# Patient Record
Sex: Male | Born: 1960
Health system: Southern US, Community
[De-identification: ages and names within clinical notes are randomized; demographics above are authoritative.]

## PROBLEM LIST (undated history)

## (undated) DIAGNOSIS — Z87442 Personal history of urinary calculi: Secondary | ICD-10-CM

## (undated) DIAGNOSIS — I2510 Atherosclerotic heart disease of native coronary artery without angina pectoris: Secondary | ICD-10-CM

## (undated) DIAGNOSIS — K219 Gastro-esophageal reflux disease without esophagitis: Secondary | ICD-10-CM

## (undated) DIAGNOSIS — E7800 Pure hypercholesterolemia, unspecified: Secondary | ICD-10-CM

## (undated) DIAGNOSIS — E559 Vitamin D deficiency, unspecified: Secondary | ICD-10-CM

## (undated) DIAGNOSIS — Z8781 Personal history of (healed) traumatic fracture: Secondary | ICD-10-CM

## (undated) DIAGNOSIS — I10 Essential (primary) hypertension: Secondary | ICD-10-CM

## (undated) DIAGNOSIS — N200 Calculus of kidney: Secondary | ICD-10-CM

## (undated) DIAGNOSIS — I209 Angina pectoris, unspecified: Secondary | ICD-10-CM

## (undated) DIAGNOSIS — G8929 Other chronic pain: Secondary | ICD-10-CM

## (undated) DIAGNOSIS — M79606 Pain in leg, unspecified: Secondary | ICD-10-CM

## (undated) HISTORY — PX: COLONOSCOPY: SHX174

## (undated) HISTORY — DX: Pure hypercholesterolemia, unspecified: E78.00

## (undated) HISTORY — DX: Calculus of kidney: N20.0

## (undated) HISTORY — DX: Pain in leg, unspecified: M79.606

## (undated) HISTORY — DX: Gastro-esophageal reflux disease without esophagitis: K21.9

## (undated) HISTORY — DX: Other chronic pain: G89.29

## (undated) HISTORY — DX: Vitamin D deficiency, unspecified: E55.9

## (undated) HISTORY — DX: Personal history of (healed) traumatic fracture: Z87.81

---
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
## 1980-12-27 HISTORY — PX: LEG SURGERY: SHX1003

## 1981-12-27 HISTORY — PX: ANKLE FRACTURE SURGERY: SHX122

## 2011-04-11 ENCOUNTER — Emergency Department: Payer: Self-pay | Admitting: Emergency Medicine

## 2011-04-14 ENCOUNTER — Ambulatory Visit: Payer: Self-pay | Admitting: Urology

## 2011-04-15 ENCOUNTER — Ambulatory Visit: Payer: Self-pay | Admitting: Urology

## 2011-04-29 ENCOUNTER — Ambulatory Visit: Payer: Self-pay | Admitting: Urology

## 2011-06-21 ENCOUNTER — Ambulatory Visit: Payer: Self-pay | Admitting: General Surgery

## 2011-06-22 LAB — PATHOLOGY REPORT

## 2012-01-24 ENCOUNTER — Ambulatory Visit: Payer: Self-pay | Admitting: Urology

## 2012-02-29 IMAGING — CR DG ABDOMEN 1V
1 series · 2 of 2 positions shown · non-contrast
Comparison: none

REASON FOR EXAM: Calculus
COMMENTS:

[Series 1: view not recorded · 0.17mm/px · 2 of 2 slices shown]
[im 1/2]
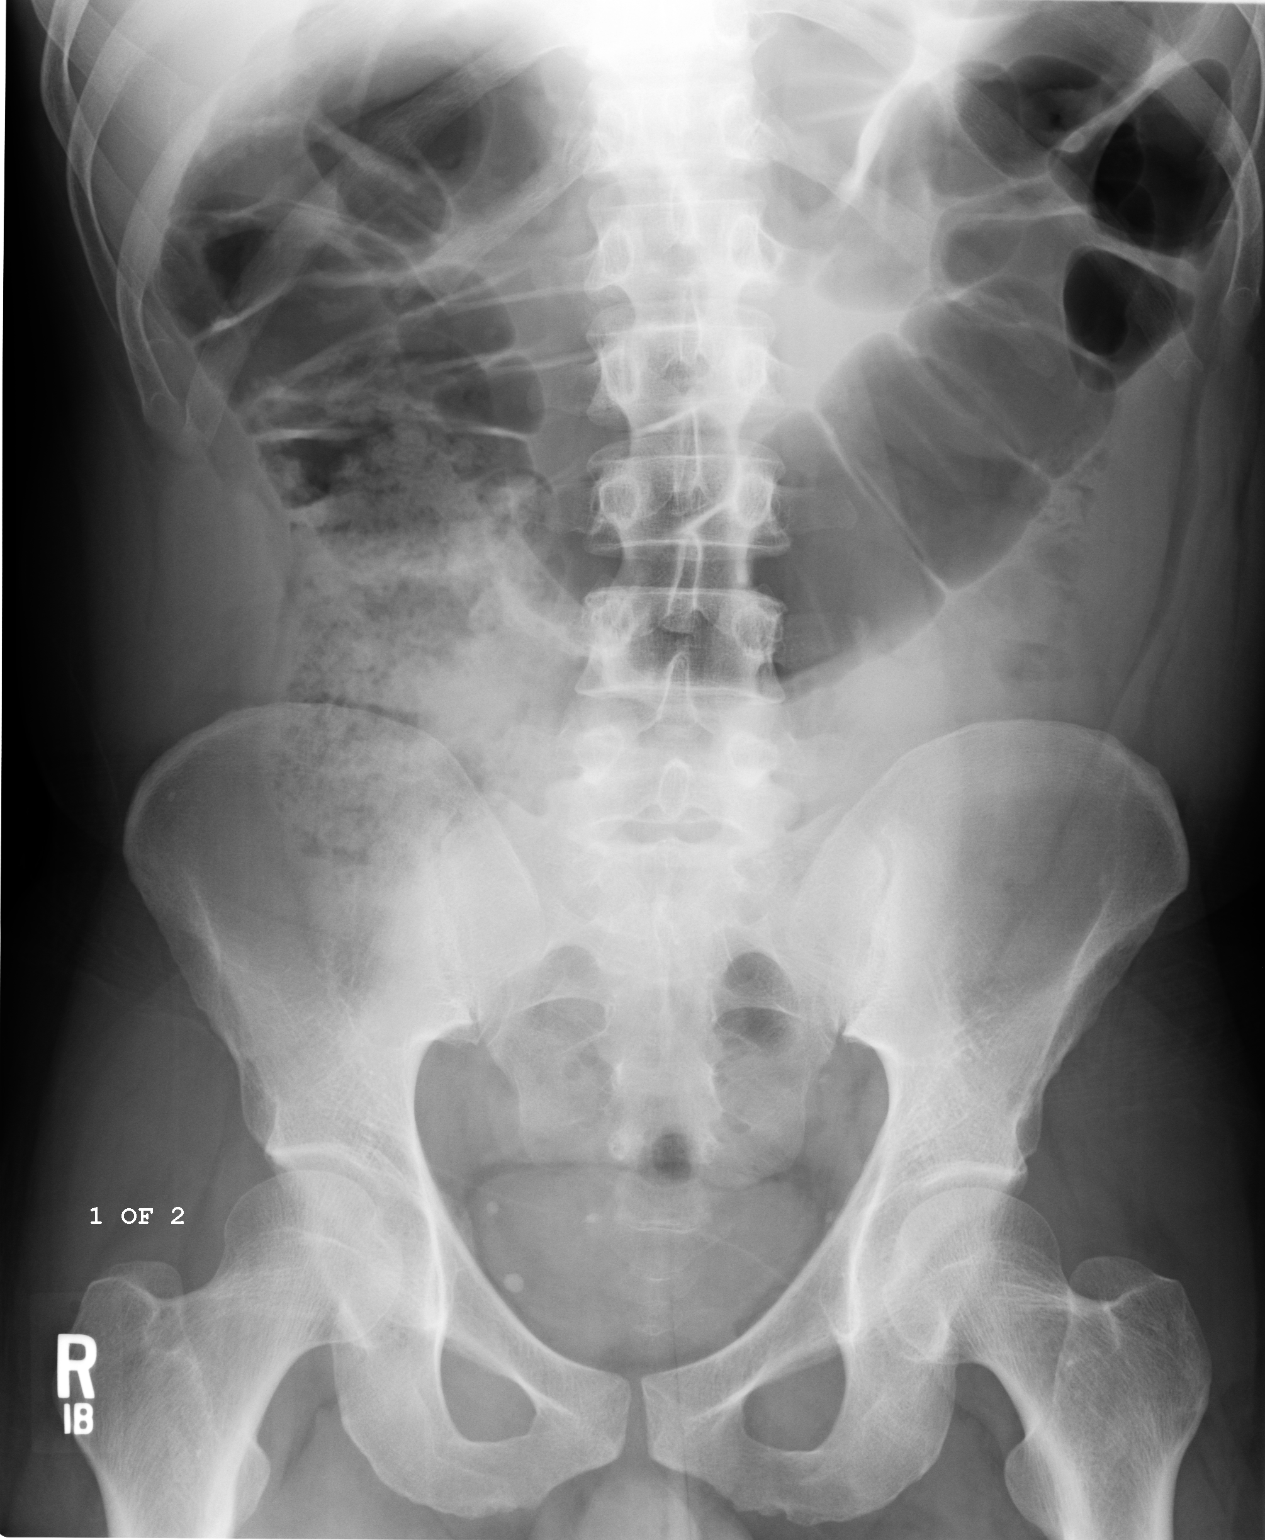
[im 2/2]
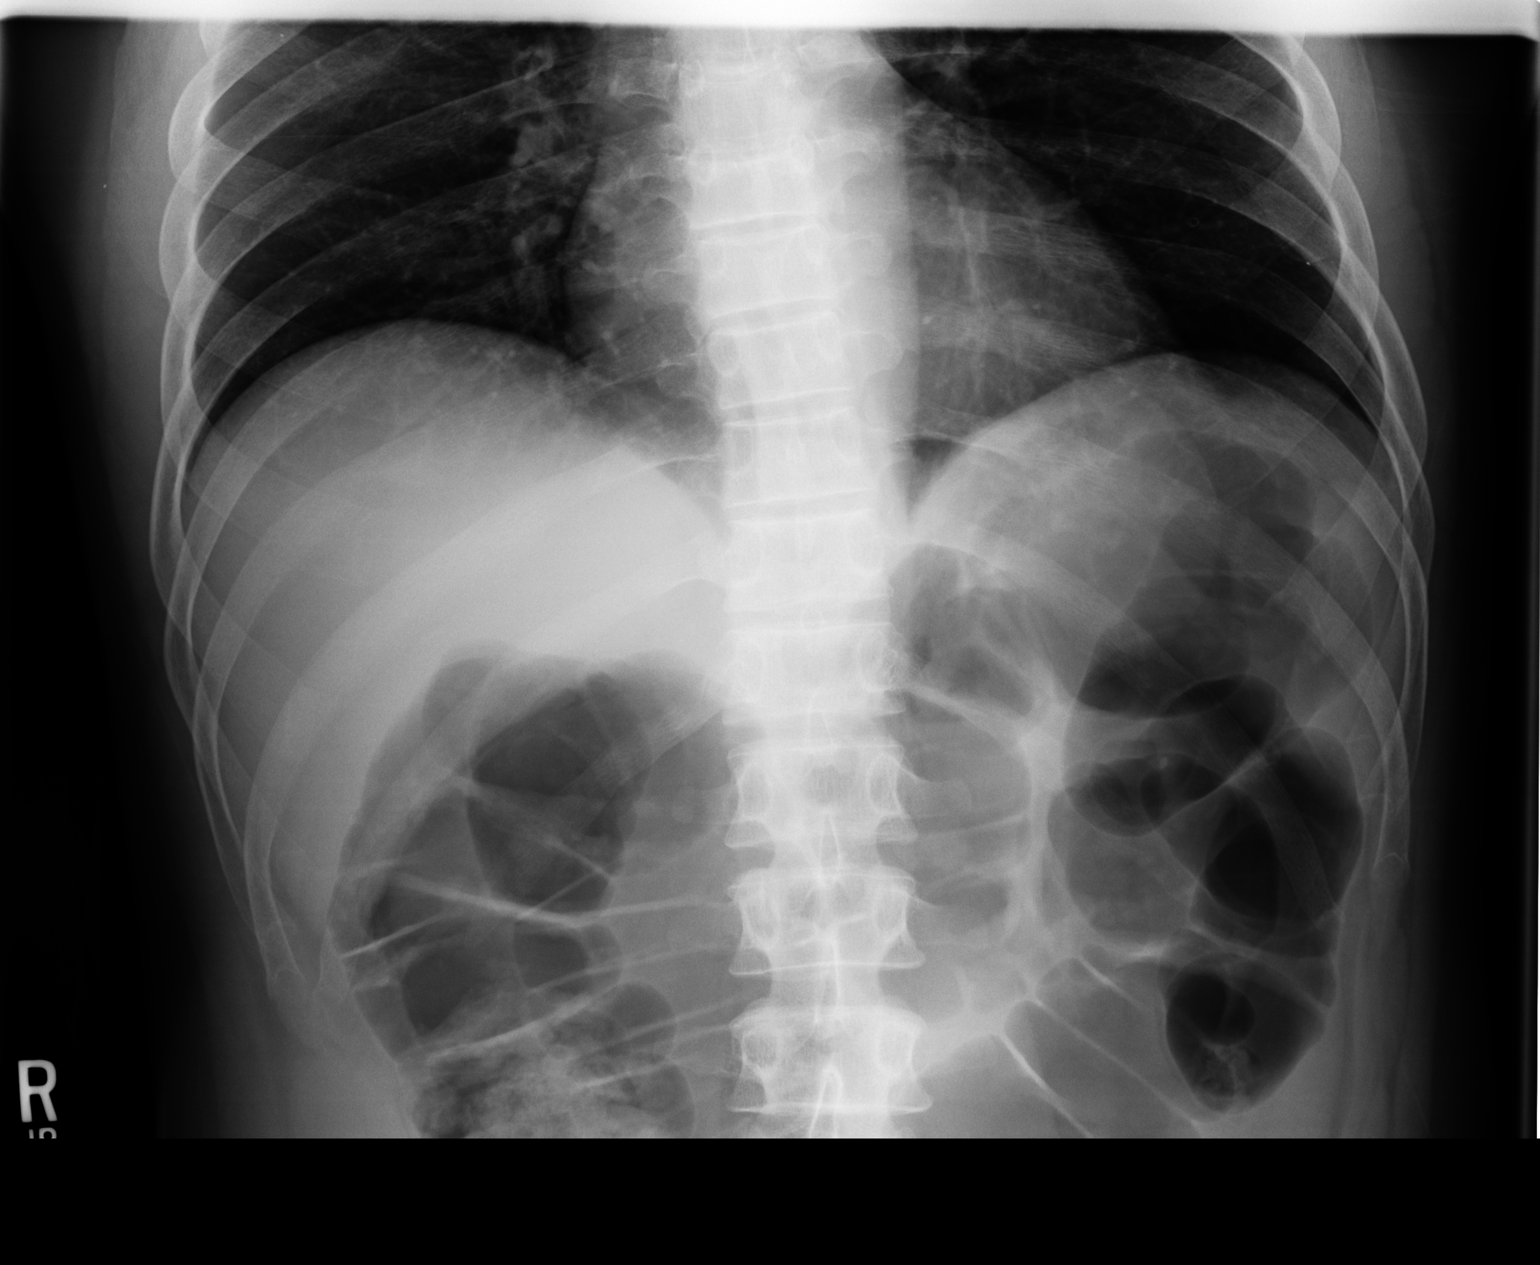

[2 of 2 positions shown; findings below may reference images not displayed]

PROCEDURE:     DXR - DXR KIDNEY URETER BLADDER  - April 15, 2011 [DATE]

RESULT:     There is a tiny calcification projected over the urinary bladder
on the right and is likely within the bladder or at the right ureterovesical
junction. This apparently represents the calcification previously present at
the level of the L4 lumbar transverse process on the right. The kidneys are
in great part obscured by overlying bowel and bowel gas. No definite
intrarenal calcifications are seen but visualization of the kidneys is
limited.
IMPRESSION: 1.  Distal right ureterolithiasis versus a tiny calcification in the urinary
bladder.
2.  The previously observed calcification at the level of the L4 lumbar
spine transverse process on the right is no longer seen and apparently has
moved inferiorly.
3.  No additional renal or ureteral calcifications are identified.

## 2013-03-23 ENCOUNTER — Ambulatory Visit (INDEPENDENT_AMBULATORY_CARE_PROVIDER_SITE_OTHER): Payer: Self-pay | Admitting: Internal Medicine

## 2013-03-23 ENCOUNTER — Encounter: Payer: Self-pay | Admitting: Internal Medicine

## 2013-03-23 VITALS — BP 120/60 | HR 70 | Temp 97.7°F | Ht 66.0 in | Wt 152.2 lb

## 2013-03-23 DIAGNOSIS — Z800 Family history of malignant neoplasm of digestive organs: Secondary | ICD-10-CM

## 2013-03-23 DIAGNOSIS — G8929 Other chronic pain: Secondary | ICD-10-CM

## 2013-03-23 DIAGNOSIS — E7800 Pure hypercholesterolemia, unspecified: Secondary | ICD-10-CM

## 2013-03-23 DIAGNOSIS — M79609 Pain in unspecified limb: Secondary | ICD-10-CM

## 2013-03-23 DIAGNOSIS — E559 Vitamin D deficiency, unspecified: Secondary | ICD-10-CM

## 2013-03-23 DIAGNOSIS — N200 Calculus of kidney: Secondary | ICD-10-CM

## 2013-03-23 DIAGNOSIS — K219 Gastro-esophageal reflux disease without esophagitis: Secondary | ICD-10-CM

## 2013-03-23 MED ORDER — PANTOPRAZOLE SODIUM 40 MG PO TBEC: 40.0000 mg | DELAYED_RELEASE_TABLET | Freq: Every day | ORAL | Status: DC

## 2013-03-23 MED ORDER — TRAMADOL HCL 50 MG PO TABS: 50.0000 mg | ORAL_TABLET | Freq: Two times a day (BID) | ORAL | Status: DC | PRN

## 2013-03-25 ENCOUNTER — Encounter: Payer: Self-pay | Admitting: Internal Medicine

## 2013-03-25 DIAGNOSIS — Z800 Family history of malignant neoplasm of digestive organs: Secondary | ICD-10-CM | POA: Insufficient documentation

## 2013-03-25 DIAGNOSIS — K219 Gastro-esophageal reflux disease without esophagitis: Secondary | ICD-10-CM | POA: Insufficient documentation

## 2013-03-25 DIAGNOSIS — E7800 Pure hypercholesterolemia, unspecified: Secondary | ICD-10-CM | POA: Insufficient documentation

## 2013-03-25 DIAGNOSIS — M79609 Pain in unspecified limb: Secondary | ICD-10-CM | POA: Insufficient documentation

## 2013-03-25 DIAGNOSIS — N200 Calculus of kidney: Secondary | ICD-10-CM | POA: Insufficient documentation

## 2013-03-25 DIAGNOSIS — E559 Vitamin D deficiency, unspecified: Secondary | ICD-10-CM | POA: Insufficient documentation

## 2013-03-25 NOTE — Assessment & Plan Note (Signed)
Followed by Dr Cope.  Currently asymptomatic.  Follow.   

## 2013-03-25 NOTE — Assessment & Plan Note (Signed)
Discussed with him my desire to avoid narcotic pain med for long term pain control.  Unable to take antiinflammatories.  Ultram 50mg  bid prn pain.  Follow.

## 2013-03-25 NOTE — Assessment & Plan Note (Signed)
Father had colon cancer.  Discussed with him the need for colonoscopy.  Apparently has tried previously with problems.  Want to refer back.  He declines at this time.  Follow.   

## 2013-03-25 NOTE — Progress Notes (Signed)
Subjective:    [name], male    DOB: 27-Apr-1961, 52 y.o.   MRN: [id number]  HPI 52 year old male with past history of GERD, kidney stones, vitamin D deficiency, hypercholesterolemia and chronic ankle and leg pain.  He comes in today accompanied by his wife.  History obtained from both of them.  States he was previously seeing Dr Bethena Midget and Dr Kathrene Alu.  Transferring his care here.  Regarding his cholesterol, he states that he developed a rash with simvastatin.  Did not tolerate Lipitor.  Off all cholesterol medication now.  Taking fish oil.  He has a history of GERD.  Takes Nexium prn.  He also has a history of kidney stones.  Followed by Dr Achilles Dunk.  On magnesium for this.  Has follow up with him in June.  He suffered a leg injury years ago.  Had surgery.  Now with chronic pain.  Works as a Nutritional therapist.  Also plays golf.  Hurts more in the evening if he has been on it a lot.  Takes hydrocodone.  Unable to take antiinflammatories.  Burns his stomach.  He does report he had a tick bite one year ago - on his back.  Still has issues with it itching.  No redness.  No headache or fever.     Past Medical History  Diagnosis Date  . Vitamin D deficiency   . Chronic leg pain     s/p injury right leg (age 20)  . History of ankle fracture     persistent pain  . Nephrolithiasis     followed by Assunta Gambles  . Hypercholesterolemia   . GERD (gastroesophageal reflux disease)     Outpatient Encounter Prescriptions as of 03/23/2013  Medication Sig Dispense Refill  . magnesium hydroxide (MILK OF MAGNESIA) 400 MG/5ML suspension Take by mouth daily.      . Vitamin D, Ergocalciferol, (DRISDOL) 50000 UNITS CAPS Take 50,000 Units by mouth.      . [DISCONTINUED] atorvastatin (LIPITOR) 20 MG tablet Take 20 mg by mouth daily.      . [DISCONTINUED] esomeprazole (NEXIUM) 40 MG packet Take 40 mg by mouth daily.      . [DISCONTINUED] HYDROcodone-acetaminophen (LORTAB) 10-500 MG per tablet Take 1 tablet by mouth  every 6 (six) hours as needed for pain.      . pantoprazole (PROTONIX) 40 MG tablet Take 1 tablet (40 mg total) by mouth daily.  30 tablet  3  . traMADol (ULTRAM) 50 MG tablet Take 1 tablet (50 mg total) by mouth 2 (two) times daily as needed for pain.  30 tablet  0   No facility-administered encounter medications on file as of 03/23/2013.    Review of Systems Patient denies any significant headache, lightheadedness or dizziness.  Does report some intermittent sinus headaches.  Some allergy symptoms with sneezing and drainage.   No chest pain, tightness or palpitations.  No increased shortness of breath, cough or congestion.  No nausea or vomiting.  Acid reflux as outlined.  No abdominal pain or cramping.  No bowel change, such as diarrhea, constipation, BRBPR or melana.  No urine change.   Chronic leg pain as outlined.  No rash.     Objective:   Physical Exam Filed Vitals:   03/23/13 1115  BP: 120/60  Pulse: 70  Temp: 97.7 F (16.71 C)   52 year old male in no acute distress.  HEENT:  Nares - clear.  Oropharynx - without lesions.  No tenderness to  palpation over the sinuses.  TMs without erythema.  NECK:  Supple.  Nontender.  No audible carotid bruit.  HEART:  Appears to be regular.   LUNGS:  No crackles or wheezing audible.  Respirations even and unlabored.   RADIAL PULSE:  Equal bilaterally.  ABDOMEN:  Soft.  Nontender.  Bowel sounds present and normal.  No audible abdominal bruit.   EXTREMITIES:  No increased edema present.  DP pulses palpable and equal bilaterally.     BACK:  Non tender.  SKIN:  Small raised lesion on mid upper back.  No surrounding erythema or warmth.  Non tender.  Well healed lesion/scarring - right lower extremity.  MSK:  No pain to palpation over the ankle.  No increased pain with increased rom.      Assessment & Plan:  S/P TICK BITE.  Occurred one year ago.  Can try antihistamine to help with itching.  Can also try 1% hydrocortisone cream for one week.   Follow.   ALLERGIES.  Can try otc antihistamine (zyrtec, claritin, etc).  Notify me if persistent problems.   HEALTH MAINTENANCE.  Schedule him for a physical.  Obtain records to review.  Check routine labs.  Followed by Dr Achilles Dunk.  Thinks he checks psa.  Obtain records.  Discussed need for colonoscopy.  He declines.

## 2013-03-25 NOTE — Assessment & Plan Note (Signed)
Will start protonix 40mg  q day.  Follow.  Discussed the need for EGD.  He has return of symptoms if he is off the medication 2-3 days.  Follow.  He desires no referral at this time.  Follow.

## 2013-03-25 NOTE — Assessment & Plan Note (Signed)
Low cholesterol diet and exercise.  Has had intolerance to some statins.  Check lipid panel.

## 2013-03-25 NOTE — Assessment & Plan Note (Signed)
Recheck vitamin D level 

## 2013-03-31 ENCOUNTER — Encounter: Payer: Self-pay | Admitting: Internal Medicine

## 2013-04-06 ENCOUNTER — Other Ambulatory Visit (INDEPENDENT_AMBULATORY_CARE_PROVIDER_SITE_OTHER): Payer: BC Managed Care – PPO

## 2013-04-06 DIAGNOSIS — E7800 Pure hypercholesterolemia, unspecified: Secondary | ICD-10-CM

## 2013-04-06 LAB — CBC WITH DIFFERENTIAL/PLATELET
Basophils Relative: 0.4 % (ref 0.0–3.0)
Eosinophils Absolute: 0.1 10*3/uL (ref 0.0–0.7)
Hemoglobin: 15.4 g/dL (ref 13.0–17.0)
Lymphs Abs: 1.9 10*3/uL (ref 0.7–4.0)
MCHC: 34.1 g/dL (ref 30.0–36.0)
MCV: 88.4 fl (ref 78.0–100.0)
Monocytes Absolute: 0.6 10*3/uL (ref 0.1–1.0)
Neutro Abs: 5.4 10*3/uL (ref 1.4–7.7)
RBC: 5.09 Mil/uL (ref 4.22–5.81)

## 2013-04-06 LAB — COMPREHENSIVE METABOLIC PANEL
ALT: 20 U/L (ref 0–53)
CO2: 26 mEq/L (ref 19–32)
Chloride: 101 mEq/L (ref 96–112)
GFR: 102.04 mL/min (ref >60.00)
Sodium: 134 mEq/L — ABNORMAL LOW (ref 135–145)
Total Bilirubin: 0.4 mg/dL (ref 0.3–1.2)
Total Protein: 7.3 g/dL (ref 6.0–8.3)

## 2013-04-06 LAB — TSH: TSH: 0.87 u[IU]/mL (ref 0.35–5.50)

## 2013-04-07 ENCOUNTER — Other Ambulatory Visit: Payer: Self-pay | Admitting: Internal Medicine

## 2013-04-07 DIAGNOSIS — E871 Hypo-osmolality and hyponatremia: Secondary | ICD-10-CM

## 2013-04-07 NOTE — Progress Notes (Signed)
Order placed for follow up sodium.  

## 2013-04-20 ENCOUNTER — Encounter: Payer: Self-pay | Admitting: Internal Medicine

## 2013-04-20 ENCOUNTER — Encounter: Payer: Self-pay | Admitting: *Deleted

## 2013-04-20 ENCOUNTER — Ambulatory Visit (INDEPENDENT_AMBULATORY_CARE_PROVIDER_SITE_OTHER): Payer: BC Managed Care – PPO | Admitting: Internal Medicine

## 2013-04-20 VITALS — BP 122/90 | HR 66 | Temp 98.0°F | Ht 66.0 in | Wt 149.8 lb

## 2013-04-20 DIAGNOSIS — Z800 Family history of malignant neoplasm of digestive organs: Secondary | ICD-10-CM

## 2013-04-20 DIAGNOSIS — E871 Hypo-osmolality and hyponatremia: Secondary | ICD-10-CM

## 2013-04-20 DIAGNOSIS — E7800 Pure hypercholesterolemia, unspecified: Secondary | ICD-10-CM

## 2013-04-20 MED ORDER — PRAVASTATIN SODIUM 10 MG PO TABS: 10.0000 mg | ORAL_TABLET | Freq: Every day | ORAL | Status: DC

## 2013-04-22 ENCOUNTER — Encounter: Payer: Self-pay | Admitting: Internal Medicine

## 2013-04-22 NOTE — Assessment & Plan Note (Signed)
Father had colon cancer.  Discussed with him the need for colonoscopy.  Apparently has tried previously with problems.  Want to refer back.  He declines at this time.  Follow.

## 2013-04-22 NOTE — Assessment & Plan Note (Signed)
Lipid profile 4/14 /14 revealed total cholesterol 268, triglycerides 111, HDL 41 and LDL 200.  Will try another statin.  Will start pravastatin 10mg  q Monday, Wednesday and Friday.  Will increase as tolerated.  Check liver panel in 6 weeks.

## 2013-04-22 NOTE — Assessment & Plan Note (Signed)
Stable

## 2013-04-22 NOTE — Progress Notes (Signed)
  Subjective:    [name], male    DOB: 10-30-1961, 52 y.o.   MRN: [id number]  HPI 52 year old male with past history of GERD, kidney stones, vitamin D deficiency, hypercholesterolemia and chronic ankle and leg pain.  He comes in today for a scheduled follow up.  States he is doing relatively well.  Stays active and reports no chest pain with increased activity or exertion.  Recent labs revealed elevated cholesterol.  Has previously taken lipitor - caused fatigue.  I had previously noted a rash with simvastatin, but he denies this.  States he has never had a rash with cholesterol medication - just intolerance.  Overall he feels things are stable.     Past Medical History  Diagnosis Date  . Vitamin D deficiency   . Chronic leg pain     s/p injury right leg (age 52)  . History of ankle fracture     persistent pain  . Nephrolithiasis     followed by Assunta Gambles  . Hypercholesterolemia   . GERD (gastroesophageal reflux disease)     Outpatient Encounter Prescriptions as of 04/20/2013  Medication Sig Dispense Refill  . pantoprazole (PROTONIX) 40 MG tablet Take 1 tablet (40 mg total) by mouth daily.  30 tablet  3  . traMADol (ULTRAM) 50 MG tablet Take 1 tablet (50 mg total) by mouth 2 (two) times daily as needed for pain.  30 tablet  0  . magnesium hydroxide (MILK OF MAGNESIA) 400 MG/5ML suspension Take by mouth daily.      . pravastatin (PRAVACHOL) 10 MG tablet Take 1 tablet (10 mg total) by mouth daily.  30 tablet  1  . Vitamin D, Ergocalciferol, (DRISDOL) 50000 UNITS CAPS Take 50,000 Units by mouth.       No facility-administered encounter medications on file as of 04/20/2013.    Review of Systems Patient denies any headache, lightheadedness or dizziness.   No chest pain, tightness or palpitations. No increased shortness of breath, cough or congestion.  No nausea or vomiting.  No acid reflux reported.  No abdominal pain or cramping.  No bowel change, such as diarrhea,  constipation, BRBPR or melana.  No urine change.   Chronic leg pain as outlined.  No rash.       Objective:   Physical Exam  Filed Vitals:   04/20/13 0858  BP: 122/90  Pulse: 66  Temp: 98 F (67.50 C)   52 year old male in no acute distress.  HEENT:  Nares - clear.  Oropharynx - without lesions.  No tenderness to palpation over the sinuses.  NECK:  Supple.  Nontender.  No audible carotid bruit.  HEART:  Appears to be regular.   LUNGS:  No crackles or wheezing audible.  Respirations even and unlabored.   RADIAL PULSE:  Equal bilaterally.  ABDOMEN:  Soft.  Nontender.  Bowel sounds present and normal.  No audible abdominal bruit.   EXTREMITIES:  No increased edema present.  DP pulses palpable and equal bilaterally.     BACK:  No rash.      Assessment & Plan:  S/P TICK BITE.  No significant lesion.  Follow.   HEALTH MAINTENANCE.  Scheduled for a physical.  Still need records to review.   Followed by Dr Achilles Dunk.  Thinks he checks psa.  Obtain records.  Again discussed need for colonoscopy.  Will notify me when agreeable.

## 2013-05-21 ENCOUNTER — Emergency Department: Payer: Self-pay | Admitting: Internal Medicine

## 2013-07-10 ENCOUNTER — Encounter: Payer: Self-pay | Admitting: Internal Medicine

## 2013-07-20 ENCOUNTER — Encounter: Payer: Self-pay | Admitting: Internal Medicine

## 2013-08-23 ENCOUNTER — Ambulatory Visit: Payer: BC Managed Care – PPO | Admitting: Adult Health

## 2013-09-17 ENCOUNTER — Encounter: Payer: Self-pay | Admitting: Internal Medicine

## 2013-09-21 ENCOUNTER — Encounter: Payer: Self-pay | Admitting: Internal Medicine

## 2013-11-16 ENCOUNTER — Encounter: Payer: Self-pay | Admitting: Internal Medicine

## 2013-11-16 ENCOUNTER — Ambulatory Visit (INDEPENDENT_AMBULATORY_CARE_PROVIDER_SITE_OTHER): Payer: BC Managed Care – PPO | Admitting: Internal Medicine

## 2013-11-16 VITALS — BP 118/70 | HR 82 | Temp 97.9°F | Ht 65.0 in | Wt 147.5 lb

## 2013-11-16 DIAGNOSIS — K219 Gastro-esophageal reflux disease without esophagitis: Secondary | ICD-10-CM

## 2013-11-16 DIAGNOSIS — Z125 Encounter for screening for malignant neoplasm of prostate: Secondary | ICD-10-CM

## 2013-11-16 DIAGNOSIS — G8929 Other chronic pain: Secondary | ICD-10-CM

## 2013-11-16 DIAGNOSIS — E559 Vitamin D deficiency, unspecified: Secondary | ICD-10-CM

## 2013-11-16 DIAGNOSIS — N200 Calculus of kidney: Secondary | ICD-10-CM

## 2013-11-16 DIAGNOSIS — M79609 Pain in unspecified limb: Secondary | ICD-10-CM

## 2013-11-16 DIAGNOSIS — E7800 Pure hypercholesterolemia, unspecified: Secondary | ICD-10-CM

## 2013-11-16 DIAGNOSIS — Z800 Family history of malignant neoplasm of digestive organs: Secondary | ICD-10-CM

## 2013-11-16 MED ORDER — ESOMEPRAZOLE MAGNESIUM 40 MG PO CPDR: 40.0000 mg | DELAYED_RELEASE_CAPSULE | Freq: Every day | ORAL | Status: DC

## 2013-11-16 NOTE — Progress Notes (Signed)
Subjective:    [name], male    DOB: 1961/04/21, 52 y.o.   MRN: [id number]  HPI 52 year old male with past history of GERD, kidney stones, vitamin D deficiency, hypercholesterolemia and chronic ankle and leg pain.  He comes in today to follow up on these issues as well as for a complete physical exam.   States he is doing relatively well.  Stays active and reports no chest pain with increased activity or exertion.  Recent labs revealed elevated cholesterol.  Has previously taken lipitor - caused fatigue.  He  had previously reported he noted a rash with simvastatin, but he denies this today.   States he has never had a rash with cholesterol medication - just intolerance.  Overall he feels things are stable.  His main complaint is that of persistent right ankle pain.  He has injured his ankle previously.  Now with persistent intermittent pain. States antiinflammatories do no help.  States tramadol makes him queezy.  We discussed not using narcotic pain meds to control this pain.   Is not taking anything now.  Brace helps some. Discussed wearing an ankle support.  Tylenol as directed.  Discussed referral to ortho for evaluation given persistent pain.       Past Medical History  Diagnosis Date  . Vitamin D deficiency   . Chronic leg pain     s/p injury right leg (age 42)  . History of ankle fracture     persistent pain  . Nephrolithiasis     followed by Assunta Gambles  . Hypercholesterolemia   . GERD (gastroesophageal reflux disease)     Outpatient Encounter Prescriptions as of 11/16/2013  Medication Sig  . magnesium hydroxide (MILK OF MAGNESIA) 400 MG/5ML suspension Take by mouth daily.  . pravastatin (PRAVACHOL) 10 MG tablet Take 1 tablet (10 mg total) by mouth daily.  . traMADol (ULTRAM) 50 MG tablet Take 1 tablet (50 mg total) by mouth 2 (two) times daily as needed for pain.  . Vitamin D, Ergocalciferol, (DRISDOL) 50000 UNITS CAPS Take 50,000 Units by mouth.  .  [DISCONTINUED] pantoprazole (PROTONIX) 40 MG tablet Take 1 tablet (40 mg total) by mouth daily.    Review of Systems Patient denies any headache, lightheadedness or dizziness.   No chest pain, tightness or palpitations. No increased shortness of breath, cough or congestion.  No nausea or vomiting.  No acid reflux reported.  No abdominal pain or cramping.  No bowel change, such as diarrhea, constipation, BRBPR or melana.  No urine change.   Chronic ankle pain as outlined.  He did not tolerate protonix.  nexium works for him.        Objective:   Physical Exam  Filed Vitals:   11/16/13 1538  BP: 118/70  Pulse: 82  Temp: 97.9 F (36.6 C)   Blood pressure recheck:  52/35  52 year old male in no acute distress.  HEENT:  Nares - clear.  Oropharynx - without lesions. NECK:  Supple.  Nontender.  No audible carotid bruit.  HEART:  Appears to be regular.   LUNGS:  No crackles or wheezing audible.  Respirations even and unlabored.   RADIAL PULSE:  Equal bilaterally.  ABDOMEN:  Soft.  Nontender.  Bowel sounds present and normal.  No audible abdominal bruit.  GU:  He declined.    EXTREMITIES:  No increased edema present.  DP pulses palpable and equal bilaterally.         Assessment &  Plan:  HEALTH MAINTENANCE.  Physical today.  He declined GU and prostate check.   Again discussed need for colonoscopy.  Will notify me when agreeable.

## 2013-11-16 NOTE — Progress Notes (Signed)
Pre-visit discussion using our clinic review tool. No additional management support is needed unless otherwise documented below in the visit note.  

## 2013-11-18 ENCOUNTER — Encounter: Payer: Self-pay | Admitting: Internal Medicine

## 2013-11-18 NOTE — Assessment & Plan Note (Signed)
Last lipid profile 4/14 /14 revealed total cholesterol 268, triglycerides 111, HDL 41 and LDL 200.  Will try another statin.  States he has not tolerated cholesterol medication in the past.  Discussed trying crestor.  Will check a more recent lipid profile on him.  Treat as needed.  Follow lipid profile and liver function.  Low cholesterol diet.

## 2013-11-18 NOTE — Assessment & Plan Note (Signed)
States did not tolerate protonix.  Has taken nexium.  This works well for him.  rx for nexium.  Follow.

## 2013-11-18 NOTE — Assessment & Plan Note (Signed)
Recheck vitamin D level 

## 2013-11-18 NOTE — Assessment & Plan Note (Signed)
Father had colon cancer.  Discussed with him the need for colonoscopy.  Apparently has tried previously with problems.  Want to refer back.  He declines at this time.  Follow.

## 2013-11-18 NOTE — Assessment & Plan Note (Signed)
Followed by Dr Cope.  Currently asymptomatic.  Follow.   

## 2013-11-18 NOTE — Assessment & Plan Note (Signed)
Pain mostly localized to his ankle.  Will refer to ortho for evaluation and treatment.  Would like to avoid narcotic medications for long term pain management.

## 2013-11-21 ENCOUNTER — Other Ambulatory Visit: Payer: Self-pay | Admitting: Internal Medicine

## 2013-11-21 NOTE — Telephone Encounter (Signed)
Pt informed me that this made him sick when he took the medication so I do not see the need of refilling.  I explained this to him at his appt.

## 2013-11-21 NOTE — Telephone Encounter (Signed)
Noted  

## 2013-11-23 ENCOUNTER — Other Ambulatory Visit: Payer: BC Managed Care – PPO

## 2013-11-26 ENCOUNTER — Other Ambulatory Visit: Payer: Self-pay | Admitting: *Deleted

## 2013-11-26 ENCOUNTER — Telehealth: Payer: Self-pay | Admitting: Internal Medicine

## 2013-11-26 NOTE — Telephone Encounter (Signed)
States Thomas Harper faxed refill request for tramadol and has not heard back.  Please advise Thomas Harper when this is ready.

## 2013-11-26 NOTE — Telephone Encounter (Signed)
See my last note on for this rx.  Pt informed me when he was here that this medication made him sick.  I do not see refilling a medication if it made him sick.  I discussed this with him when he was here.

## 2013-11-26 NOTE — Telephone Encounter (Signed)
Okay to refill? States Thomas Harper faxed refill request for tramadol and has not heard back. Please advise Thomas Harper when this is ready.

## 2013-11-26 NOTE — Telephone Encounter (Signed)
Sent message & refill request to Dr. Lorin Picket

## 2013-11-28 NOTE — Telephone Encounter (Signed)
I notified Thomas Harper why we would not approve this refill

## 2013-11-30 ENCOUNTER — Other Ambulatory Visit (INDEPENDENT_AMBULATORY_CARE_PROVIDER_SITE_OTHER): Payer: BC Managed Care – PPO

## 2013-11-30 DIAGNOSIS — E559 Vitamin D deficiency, unspecified: Secondary | ICD-10-CM

## 2013-11-30 DIAGNOSIS — E871 Hypo-osmolality and hyponatremia: Secondary | ICD-10-CM

## 2013-11-30 DIAGNOSIS — E7800 Pure hypercholesterolemia, unspecified: Secondary | ICD-10-CM

## 2013-11-30 DIAGNOSIS — Z125 Encounter for screening for malignant neoplasm of prostate: Secondary | ICD-10-CM

## 2013-11-30 LAB — COMPREHENSIVE METABOLIC PANEL
ALT: 22 U/L (ref 0–53)
AST: 21 U/L (ref 0–37)
Alkaline Phosphatase: 60 U/L (ref 39–117)
BUN: 19 mg/dL (ref 6–23)
Chloride: 104 mEq/L (ref 96–112)
Creatinine, Ser: 0.7 mg/dL (ref 0.4–1.5)
Potassium: 4.6 mEq/L (ref 3.5–5.1)
Total Bilirubin: 0.6 mg/dL (ref 0.3–1.2)

## 2013-11-30 LAB — LIPID PANEL
HDL: 50.7 mg/dL (ref >39.00)
Total CHOL/HDL Ratio: 5
Triglycerides: 84 mg/dL (ref 0.0–149.0)
VLDL: 16.8 mg/dL (ref 0.0–40.0)

## 2013-11-30 LAB — LDL CHOLESTEROL, DIRECT: Direct LDL: 210.3 mg/dL

## 2013-12-01 LAB — VITAMIN D 25 HYDROXY (VIT D DEFICIENCY, FRACTURES): Vit D, 25-Hydroxy: 31 ng/mL (ref 30–89)

## 2013-12-03 ENCOUNTER — Other Ambulatory Visit: Payer: Self-pay | Admitting: *Deleted

## 2013-12-03 ENCOUNTER — Other Ambulatory Visit: Payer: Self-pay | Admitting: Internal Medicine

## 2013-12-03 DIAGNOSIS — E7800 Pure hypercholesterolemia, unspecified: Secondary | ICD-10-CM

## 2013-12-03 MED ORDER — ROSUVASTATIN CALCIUM 10 MG PO TABS: 10.0000 mg | ORAL_TABLET | Freq: Every day | ORAL | Status: DC

## 2013-12-03 MED ORDER — TRAMADOL HCL 50 MG PO TABS: 50.0000 mg | ORAL_TABLET | Freq: Two times a day (BID) | ORAL | Status: DC | PRN

## 2013-12-03 NOTE — Progress Notes (Signed)
Orders placed for liver panel check.

## 2013-12-03 NOTE — Telephone Encounter (Signed)
Pt called back after speaking with his wife & states that he gave you the wrong medication name at his last visit when he mentioned that it made him sick. He states that he has no problems with the Tramadol

## 2013-12-03 NOTE — Telephone Encounter (Signed)
Refilled tramadol #30 with no refills.   We will monitor.  He is not to take with alcohol.

## 2013-12-05 NOTE — Telephone Encounter (Signed)
Rx faxed on 12/04/13

## 2013-12-31 ENCOUNTER — Ambulatory Visit (INDEPENDENT_AMBULATORY_CARE_PROVIDER_SITE_OTHER): Payer: BC Managed Care – PPO | Admitting: Adult Health

## 2013-12-31 ENCOUNTER — Encounter (INDEPENDENT_AMBULATORY_CARE_PROVIDER_SITE_OTHER): Payer: Self-pay

## 2013-12-31 ENCOUNTER — Encounter: Payer: Self-pay | Admitting: Adult Health

## 2013-12-31 VITALS — BP 110/70 | HR 87 | Temp 99.3°F | Resp 12 | Wt 144.0 lb

## 2013-12-31 DIAGNOSIS — J329 Chronic sinusitis, unspecified: Secondary | ICD-10-CM

## 2013-12-31 DIAGNOSIS — R05 Cough: Secondary | ICD-10-CM

## 2013-12-31 DIAGNOSIS — Z20828 Contact with and (suspected) exposure to other viral communicable diseases: Secondary | ICD-10-CM

## 2013-12-31 DIAGNOSIS — R059 Cough, unspecified: Secondary | ICD-10-CM

## 2013-12-31 LAB — POCT INFLUENZA A/B
INFLUENZA B, POC: NEGATIVE
Influenza A, POC: NEGATIVE

## 2013-12-31 MED ORDER — FLUTICASONE PROPIONATE 50 MCG/ACT NA SUSP: 2.0000 | Freq: Every day | NASAL | Status: DC

## 2013-12-31 MED ORDER — GUAIFENESIN-CODEINE 100-10 MG/5ML PO SYRP: 5.0000 mL | ORAL_SOLUTION | Freq: Three times a day (TID) | ORAL | Status: DC | PRN

## 2013-12-31 MED ORDER — OSELTAMIVIR PHOSPHATE 75 MG PO CAPS: 75.0000 mg | ORAL_CAPSULE | Freq: Every day | ORAL | Status: DC

## 2013-12-31 NOTE — Assessment & Plan Note (Signed)
Wife diagnosed with influenza today. Patient is negative for influenza. Start Tamiflu 75 mg daily x10 days.

## 2013-12-31 NOTE — Progress Notes (Signed)
Pre visit review using our clinic review tool, if applicable. No additional management support is needed unless otherwise documented below in the visit note. 

## 2013-12-31 NOTE — Patient Instructions (Signed)
  Start Tamiflu 75 mg daily for 10 days.  Flonase nasal spray 2 sprays into each nostril daily  Cheratussin for severe cough. This has codeine and may cause sedation. Do not drive while taking this medication.  Drink plenty of fluids to stay hydrated.  Please call if your symptoms do not improve within 4-5 days or sooner if necessary.

## 2013-12-31 NOTE — Progress Notes (Signed)
   Subjective:    [name], male    DOB: July 12, 1961, 53 y.o.   MRN: [id number]  HPI  Pt presents with sinus congestion, post nasal drip, pressure within the sinuses. His wife was just diagnosed with influenza today. Patient reports No fever, chills. Appetite slightly decreased. Having general body discomfort. Cough which has been keeping him up at night.   Current Outpatient Prescriptions on File Prior to Visit  Medication Sig Dispense Refill  . esomeprazole (NEXIUM) 40 MG capsule Take 1 capsule (40 mg total) by mouth daily.  30 capsule  2  . rosuvastatin (CRESTOR) 10 MG tablet Take 1 tablet (10 mg total) by mouth daily.  30 tablet  1  . traMADol (ULTRAM) 50 MG tablet Take 1 tablet (50 mg total) by mouth 2 (two) times daily as needed.  30 tablet  0   No current facility-administered medications on file prior to visit.      Review of Systems  Constitutional: Negative for fever and chills.  HENT: Positive for congestion, postnasal drip, rhinorrhea, sinus pressure and sore throat.   Respiratory: Positive for cough.        Objective:   Physical Exam  Constitutional: He is oriented to person, place, and time. He appears well-developed and well-nourished. No distress.  Appears acutely ill  HENT:  Head: Normocephalic and atraumatic.  Right Ear: External ear normal.  Left Ear: External ear normal.  Mouth/Throat: No oropharyngeal exudate.  Pharyngeal erythema. Drainage posterior pharynx. No exudate.  Neck: Normal range of motion. Neck supple.  Cardiovascular: Normal rate, regular rhythm, normal heart sounds and intact distal pulses.   No murmur heard. Pulmonary/Chest: Effort normal and breath sounds normal. No respiratory distress. He has no wheezes. He has no rales.  Lymphadenopathy:    He has no cervical adenopathy.  Neurological: He is alert and oriented to person, place, and time.  Skin: Skin is warm and dry.  Psychiatric: He has a normal mood and affect.  His behavior is normal. Thought content normal.    BP 110/70  Pulse 87  Temp(Src) 99.3 F (37.4 C) (Oral)  Resp 12  Wt 144 lb (65.318 kg)  SpO2 95%       Assessment & Plan:

## 2013-12-31 NOTE — Assessment & Plan Note (Signed)
Start Flonase nasal spray 2 sprays into each nostril daily. Robitussin-AC for severe cough. Instructed to drink plenty of fluids to maintain hydration.

## 2014-01-18 ENCOUNTER — Other Ambulatory Visit (INDEPENDENT_AMBULATORY_CARE_PROVIDER_SITE_OTHER): Payer: BC Managed Care – PPO

## 2014-01-18 ENCOUNTER — Encounter (INDEPENDENT_AMBULATORY_CARE_PROVIDER_SITE_OTHER): Payer: Self-pay

## 2014-01-18 ENCOUNTER — Other Ambulatory Visit: Payer: Self-pay | Admitting: *Deleted

## 2014-01-18 ENCOUNTER — Telehealth: Payer: Self-pay | Admitting: Internal Medicine

## 2014-01-18 ENCOUNTER — Encounter: Payer: Self-pay | Admitting: *Deleted

## 2014-01-18 DIAGNOSIS — E7800 Pure hypercholesterolemia, unspecified: Secondary | ICD-10-CM

## 2014-01-18 LAB — HEPATIC FUNCTION PANEL
ALT: 25 U/L (ref 0–53)
AST: 21 U/L (ref 0–37)
Albumin: 4 g/dL (ref 3.5–5.2)
Alkaline Phosphatase: 63 U/L (ref 39–117)
BILIRUBIN DIRECT: 0 mg/dL (ref 0.0–0.3)
BILIRUBIN TOTAL: 0.6 mg/dL (ref 0.3–1.2)
Total Protein: 7 g/dL (ref 6.0–8.3)

## 2014-01-18 MED ORDER — TRAMADOL HCL 50 MG PO TABS: 50.0000 mg | ORAL_TABLET | Freq: Two times a day (BID) | ORAL | Status: DC | PRN

## 2014-01-18 NOTE — Telephone Encounter (Signed)
Ok to refill x 1, but will need to get the contract signed at appt.

## 2014-01-18 NOTE — Telephone Encounter (Signed)
Pt will come by today to sign contract & pick up Rx

## 2014-01-18 NOTE — Telephone Encounter (Signed)
Patient would like a refill called in for his tramadol hcl 50 mg he states this is helping his leg. He uses [facility].

## 2014-01-18 NOTE — Telephone Encounter (Signed)
Okay to refill? Pt also needs to sign controlled substance contract (can sign at upcoming lab appt)

## 2014-01-18 NOTE — Telephone Encounter (Signed)
Correction: patient has already came this morning for lab appt. Next appt scheduled in March

## 2014-01-21 ENCOUNTER — Encounter: Payer: Self-pay | Admitting: *Deleted

## 2014-02-19 ENCOUNTER — Encounter: Payer: Self-pay | Admitting: Internal Medicine

## 2014-02-20 ENCOUNTER — Other Ambulatory Visit: Payer: Self-pay | Admitting: Internal Medicine

## 2014-02-20 NOTE — Telephone Encounter (Signed)
LMTCB with information

## 2014-02-20 NOTE — Telephone Encounter (Signed)
Please confirm with pt how often he is taking.  If states taking daily, then he will need to come in and give urine sample (drug testing) prior to refill.

## 2014-02-20 NOTE — Telephone Encounter (Signed)
Ok refill? 

## 2014-02-22 NOTE — Telephone Encounter (Signed)
Pt states the he takes 1-2 a day

## 2014-02-22 NOTE — Telephone Encounter (Signed)
Pt states that he will be in today to give a urine sample. Okay to refill? Please advise

## 2014-02-22 NOTE — Telephone Encounter (Signed)
Please document that he states he has been taking daily.  Is he coming today to give urine sample?  If so, then can refill when he comes in for urine.  Thanks.  If unable to get in, then refill x 1.  Needs to come in asap for urine sample.

## 2014-03-22 ENCOUNTER — Ambulatory Visit (INDEPENDENT_AMBULATORY_CARE_PROVIDER_SITE_OTHER): Payer: BC Managed Care – PPO | Admitting: Internal Medicine

## 2014-03-22 ENCOUNTER — Encounter: Payer: Self-pay | Admitting: Internal Medicine

## 2014-03-22 VITALS — BP 120/80 | HR 68 | Temp 97.8°F | Ht 65.0 in | Wt 149.2 lb

## 2014-03-22 DIAGNOSIS — E559 Vitamin D deficiency, unspecified: Secondary | ICD-10-CM

## 2014-03-22 DIAGNOSIS — K219 Gastro-esophageal reflux disease without esophagitis: Secondary | ICD-10-CM

## 2014-03-22 DIAGNOSIS — M79606 Pain in leg, unspecified: Secondary | ICD-10-CM

## 2014-03-22 DIAGNOSIS — Z720 Tobacco use: Secondary | ICD-10-CM

## 2014-03-22 DIAGNOSIS — N200 Calculus of kidney: Secondary | ICD-10-CM

## 2014-03-22 DIAGNOSIS — Z125 Encounter for screening for malignant neoplasm of prostate: Secondary | ICD-10-CM

## 2014-03-22 DIAGNOSIS — M79609 Pain in unspecified limb: Secondary | ICD-10-CM

## 2014-03-22 DIAGNOSIS — F17200 Nicotine dependence, unspecified, uncomplicated: Secondary | ICD-10-CM

## 2014-03-22 DIAGNOSIS — G8929 Other chronic pain: Secondary | ICD-10-CM

## 2014-03-22 DIAGNOSIS — E7800 Pure hypercholesterolemia, unspecified: Secondary | ICD-10-CM

## 2014-03-22 DIAGNOSIS — Z800 Family history of malignant neoplasm of digestive organs: Secondary | ICD-10-CM

## 2014-03-22 MED ORDER — TRAMADOL HCL 50 MG PO TABS: 50.0000 mg | ORAL_TABLET | Freq: Every day | ORAL | Status: DC | PRN

## 2014-03-22 NOTE — Progress Notes (Signed)
Subjective:    [name], male    DOB: 10-20-1961, 53 y.o.   MRN: [id number]  HPI 53 year old male with past history of GERD, kidney stones, vitamin D deficiency, hypercholesterolemia and chronic ankle and leg pain.  He comes in today for a scheduled follow up.  States he is doing relatively well.  Stays active and reports no chest pain with increased activity or exertion.  Recent labs revealed elevated cholesterol.  Has previously taken lipitor - caused fatigue.  He is tolerating crestor and taking regularly.  Overall he feels things are stable.   Taking ultram for his ankle pain.  Tolerating.  Only takes one per day - at the most.  Still smoking.  We discussed the need to quit.  Breathing stable.  No nausea or vomiting.  No bowel change.  Did not get his colonoscopy.  Unable to tolerate the prep.  Willing to try again with a different prep.        Past Medical History  Diagnosis Date  . Vitamin D deficiency   . Chronic leg pain     s/p injury right leg (age 53)  . History of ankle fracture     persistent pain  . Nephrolithiasis     followed by Assunta GamblesBrian Cope  . Hypercholesterolemia   . GERD (gastroesophageal reflux disease)     Outpatient Encounter Prescriptions as of 03/22/2014  Medication Sig  . CRESTOR 10 MG tablet TAKE ONE (1) TABLET EACH DAY  . esomeprazole (NEXIUM) 40 MG capsule Take 20 mg by mouth daily.  . traMADol (ULTRAM) 50 MG tablet TAKE 1 TABLET TWICE A DAY AS NEEDED FOR PAIN  . [DISCONTINUED] esomeprazole (NEXIUM) 40 MG capsule Take 1 capsule (40 mg total) by mouth daily.  . [DISCONTINUED] fluticasone (FLONASE) 50 MCG/ACT nasal spray Place 2 sprays into both nostrils daily.  . [DISCONTINUED] guaiFENesin-codeine (CHERATUSSIN AC) 100-10 MG/5ML syrup Take 5 mLs by mouth 3 (three) times daily as needed for cough.  . [DISCONTINUED] oseltamivir (TAMIFLU) 75 MG capsule Take 1 capsule (75 mg total) by mouth daily.    Review of Systems Patient denies any headache,  lightheadedness or dizziness.   No chest pain, tightness or palpitations.  No increased shortness of breath, cough or congestion.  No nausea or vomiting.  No acid reflux reported.  No abdominal pain or cramping.  No bowel change, such as diarrhea, constipation, BRBPR or melana.  No urine change.   Chronic ankle pain.  Appears to be better now.  Taking tramadol - one per day at the most.  Nexium controls acid reflux.         Objective:   Physical Exam  Filed Vitals:   03/22/14 1636  BP: 120/80  Pulse: 68  Temp: 97.8 F (2536.196 C)   53 year old male in no acute distress.  HEENT:  Nares - clear.  Oropharynx - without lesions. NECK:  Supple.  Nontender.  No audible carotid bruit.  HEART:  Appears to be regular.   LUNGS:  No crackles or wheezing audible.  Respirations even and unlabored.   RADIAL PULSE:  Equal bilaterally.  ABDOMEN:  Soft.  Nontender.  Bowel sounds present and normal.  No audible abdominal bruit.   EXTREMITIES:  No increased edema present.  DP pulses palpable and equal bilaterally.         Assessment & Plan:  HEALTH MAINTENANCE.  Physical 11/16/13.   He declined GU and prostate check.   Again discussed  need for colonoscopy.  See above.   I spent 25 minutes with the patient and more than 50% of the time was spent in consultation regarding the above.

## 2014-03-22 NOTE — Progress Notes (Signed)
Pre-visit discussion using our clinic review tool. No additional management support is needed unless otherwise documented below in the visit note.  

## 2014-03-25 ENCOUNTER — Encounter: Payer: Self-pay | Admitting: Internal Medicine

## 2014-03-25 ENCOUNTER — Telehealth: Payer: Self-pay | Admitting: Internal Medicine

## 2014-03-25 DIAGNOSIS — F17200 Nicotine dependence, unspecified, uncomplicated: Secondary | ICD-10-CM | POA: Insufficient documentation

## 2014-03-25 NOTE — Assessment & Plan Note (Signed)
Father had colon cancer.  Again discussed with him the need for colonoscopy.  Apparently has tried previously with problems.  Had problems with the prep.  We get him referred back for evaluation.    

## 2014-03-25 NOTE — Assessment & Plan Note (Signed)
Followed by Dr Achilles Dunkope.  Currently asymptomatic.  Follow.

## 2014-03-25 NOTE — Assessment & Plan Note (Signed)
Continue on nexium.  Controls.  Follow.   

## 2014-03-25 NOTE — Assessment & Plan Note (Signed)
Recheck vitamin D level 

## 2014-03-25 NOTE — Telephone Encounter (Signed)
Pt needs a physical in 3 months.  Needs fasting labs within 1-2 weeks.  Please schedule and contact him with the appts.   Thanks.  Needs Friday appts.

## 2014-03-25 NOTE — Assessment & Plan Note (Signed)
On crestor now.  Check lipid panel and liver function.

## 2014-03-25 NOTE — Assessment & Plan Note (Signed)
Pain mostly localized to his ankle.  Appears to be better now.  On tramadol.  Tolerating.  Only takes one per day at the most.  Follow.

## 2014-03-25 NOTE — Assessment & Plan Note (Signed)
Discussed the need to quit smoking.  He declines.  Will follow.  Plans to cut back.   

## 2014-04-22 ENCOUNTER — Encounter: Payer: Self-pay | Admitting: Internal Medicine

## 2014-05-06 ENCOUNTER — Ambulatory Visit: Payer: BC Managed Care – PPO | Admitting: Adult Health

## 2014-05-07 ENCOUNTER — Ambulatory Visit (INDEPENDENT_AMBULATORY_CARE_PROVIDER_SITE_OTHER): Payer: BC Managed Care – PPO | Admitting: Adult Health

## 2014-05-07 ENCOUNTER — Encounter: Payer: Self-pay | Admitting: Adult Health

## 2014-05-07 VITALS — BP 134/90 | HR 90 | Temp 97.8°F | Resp 14 | Wt 149.8 lb

## 2014-05-07 DIAGNOSIS — S40869A Insect bite (nonvenomous) of unspecified upper arm, initial encounter: Secondary | ICD-10-CM | POA: Insufficient documentation

## 2014-05-07 DIAGNOSIS — S40269A Insect bite (nonvenomous) of unspecified shoulder, initial encounter: Secondary | ICD-10-CM

## 2014-05-07 DIAGNOSIS — W57XXXA Bitten or stung by nonvenomous insect and other nonvenomous arthropods, initial encounter: Principal | ICD-10-CM

## 2014-05-07 MED ORDER — DOXYCYCLINE HYCLATE 100 MG PO TABS: 100.0000 mg | ORAL_TABLET | Freq: Two times a day (BID) | ORAL | Status: DC

## 2014-05-07 MED ORDER — DOXYCYCLINE HYCLATE 100 MG PO TABS
100.0000 mg | ORAL_TABLET | Freq: Two times a day (BID) | ORAL | Status: DC
Start: 2014-05-07 — End: 2014-10-11

## 2014-05-07 NOTE — Patient Instructions (Signed)
  Start Doxycycline 100 mg twice a day for 14 days. Take food with this medicine.  Drink plenty of water.  You can apply benadryl cream to the area that is itchy or take over the counter benadryl 25 mg at bedtime for the itchiness. Benadryl may cause drowsiness.

## 2014-05-07 NOTE — Progress Notes (Signed)
Pre visit review using our clinic review tool, if applicable. No additional management support is needed unless otherwise documented below in the visit note. 

## 2014-05-07 NOTE — Progress Notes (Signed)
   Subjective:    [name], male    DOB: 30-May-1961, 53 y.o.   MRN: [id number]  HPI Patient is a 53 year old male who presents to clinic after removing a tick from his left axilla. He reports that tick was not engorged. Dear tick. The area surrounding the tick bite is itchy. He has been taking Benadryl. No fever or chills.  Past Medical History  Diagnosis Date  . Vitamin D deficiency   . Chronic leg pain     s/p injury right leg (age 53)  . History of ankle fracture     persistent pain  . Nephrolithiasis     followed by Assunta GamblesBrian Cope  . Hypercholesterolemia   . GERD (gastroesophageal reflux disease)     Review of Systems  Constitutional: Negative.   HENT: Negative.   Eyes: Negative.   Respiratory: Negative.   Cardiovascular: Negative.   Gastrointestinal: Negative.   Endocrine: Negative.   Genitourinary: Negative.   Musculoskeletal: Negative.   Skin: Positive for rash.       Itching left axilla, left lateral chest  Allergic/Immunologic: Negative.   Neurological: Negative.   Hematological: Negative.   Psychiatric/Behavioral: Negative.   All other systems reviewed and are negative.  BP 134/90  Pulse 90  Temp(Src) 97.8 F (36.6 C) (Oral)  Resp 14  Wt 149 lb 12 oz (67.926 kg)  SpO2 98%     Objective:   Physical Exam  Constitutional: He is oriented to person, place, and time. He appears well-developed and well-nourished. No distress.  HENT:  Head: Normocephalic and atraumatic.  Eyes: Conjunctivae and EOM are normal.  Neck: Neck supple.  Cardiovascular: Normal rate and regular rhythm.   Pulmonary/Chest: Effort normal. No respiratory distress.  Musculoskeletal: Normal range of motion.  Neurological: He is alert and oriented to person, place, and time.  Skin: Rash noted. There is erythema.  Left axilla tick bite. Erythema entire left lateral chest. Rash does not appear erythema migrans. Rash appears more like inflammatory response  Psychiatric: He has  a normal mood and affect. His behavior is normal. Judgment and thought content normal.      Assessment & Plan:   1. Tick bite of axillary region Significant erythema surrounding tick bite. Does not appear erythema migrans rash. More like inflammatory response. Start Doxycycline 100 mg bid x 14 days. Benadryl for itching. RTC if no improvement within 1 week or sooner if necessary. DEET to help prevent tick bites.

## 2014-08-06 NOTE — Telephone Encounter (Signed)
Note has been given to schedule fasting labs and physical since not done.

## 2014-08-07 ENCOUNTER — Telehealth: Payer: Self-pay | Admitting: Internal Medicine

## 2014-08-07 NOTE — Telephone Encounter (Signed)
LMTCB to make appt per Dr. Lorin PicketScott request for a follow up

## 2014-10-11 ENCOUNTER — Ambulatory Visit (INDEPENDENT_AMBULATORY_CARE_PROVIDER_SITE_OTHER): Payer: No Typology Code available for payment source | Admitting: Internal Medicine

## 2014-10-11 VITALS — BP 122/80 | HR 68 | Temp 97.7°F | Ht 65.0 in | Wt 149.5 lb

## 2014-10-11 DIAGNOSIS — E7800 Pure hypercholesterolemia, unspecified: Secondary | ICD-10-CM

## 2014-10-11 DIAGNOSIS — M79601 Pain in right arm: Secondary | ICD-10-CM

## 2014-10-11 DIAGNOSIS — N644 Mastodynia: Secondary | ICD-10-CM

## 2014-10-11 DIAGNOSIS — G8929 Other chronic pain: Secondary | ICD-10-CM

## 2014-10-11 DIAGNOSIS — E559 Vitamin D deficiency, unspecified: Secondary | ICD-10-CM

## 2014-10-11 DIAGNOSIS — Z125 Encounter for screening for malignant neoplasm of prostate: Secondary | ICD-10-CM

## 2014-10-11 DIAGNOSIS — M79604 Pain in right leg: Secondary | ICD-10-CM

## 2014-10-11 DIAGNOSIS — K219 Gastro-esophageal reflux disease without esophagitis: Secondary | ICD-10-CM

## 2014-10-11 DIAGNOSIS — M79606 Pain in leg, unspecified: Secondary | ICD-10-CM

## 2014-10-11 DIAGNOSIS — Z720 Tobacco use: Secondary | ICD-10-CM

## 2014-10-11 DIAGNOSIS — Z800 Family history of malignant neoplasm of digestive organs: Secondary | ICD-10-CM

## 2014-10-11 LAB — LIPID PANEL
CHOL/HDL RATIO: 4
Cholesterol: 167 mg/dL (ref 0–200)
HDL: 40.7 mg/dL (ref >39.00)
LDL Cholesterol: 113 mg/dL — ABNORMAL HIGH (ref 0–99)
NONHDL: 126.3
Triglycerides: 66 mg/dL (ref 0.0–149.0)
VLDL: 13.2 mg/dL (ref 0.0–40.0)

## 2014-10-11 LAB — CBC WITH DIFFERENTIAL/PLATELET
BASOS PCT: 0.5 % (ref 0.0–3.0)
Basophils Absolute: 0 10*3/uL (ref 0.0–0.1)
EOS ABS: 0.1 10*3/uL (ref 0.0–0.7)
EOS PCT: 2.1 % (ref 0.0–5.0)
HCT: 47.8 % (ref 39.0–52.0)
Hemoglobin: 15.6 g/dL (ref 13.0–17.0)
Lymphocytes Relative: 30.4 % (ref 12.0–46.0)
Lymphs Abs: 2 10*3/uL (ref 0.7–4.0)
MCHC: 32.6 g/dL (ref 30.0–36.0)
MCV: 90.8 fl (ref 78.0–100.0)
MONO ABS: 0.5 10*3/uL (ref 0.1–1.0)
Monocytes Relative: 8.2 % (ref 3.0–12.0)
NEUTROS ABS: 3.8 10*3/uL (ref 1.4–7.7)
NEUTROS PCT: 58.8 % (ref 43.0–77.0)
Platelets: 264 10*3/uL (ref 150.0–400.0)
RBC: 5.27 Mil/uL (ref 4.22–5.81)
RDW: 13.9 % (ref 11.5–15.5)
WBC: 6.4 10*3/uL (ref 4.0–10.5)

## 2014-10-11 LAB — COMPREHENSIVE METABOLIC PANEL
ALBUMIN: 3.6 g/dL (ref 3.5–5.2)
ALT: 28 U/L (ref 0–53)
AST: 28 U/L (ref 0–37)
Alkaline Phosphatase: 62 U/L (ref 39–117)
BUN: 14 mg/dL (ref 6–23)
CALCIUM: 9.7 mg/dL (ref 8.4–10.5)
CHLORIDE: 104 meq/L (ref 96–112)
CO2: 26 mEq/L (ref 19–32)
Creatinine, Ser: 0.9 mg/dL (ref 0.4–1.5)
GFR: 98.72 mL/min (ref >60.00)
Glucose, Bld: 97 mg/dL (ref 70–99)
POTASSIUM: 4.9 meq/L (ref 3.5–5.1)
Sodium: 139 mEq/L (ref 135–145)
Total Bilirubin: 0.6 mg/dL (ref 0.2–1.2)
Total Protein: 6.9 g/dL (ref 6.0–8.3)

## 2014-10-11 LAB — TSH: TSH: 1.47 u[IU]/mL (ref 0.35–4.50)

## 2014-10-11 LAB — PSA: PSA: 2.09 ng/mL (ref 0.10–4.00)

## 2014-10-11 MED ORDER — MUPIROCIN CALCIUM 2 % EX CREA: 1.0000 "application " | TOPICAL_CREAM | Freq: Two times a day (BID) | CUTANEOUS | Status: DC

## 2014-10-11 MED ORDER — TRAMADOL HCL 50 MG PO TABS: 50.0000 mg | ORAL_TABLET | Freq: Every day | ORAL | Status: DC | PRN

## 2014-10-11 NOTE — Progress Notes (Signed)
Subjective:    [name], male    DOB: 08-21-1961, 53 y.o.   MRN: [id number]  HPI 53 year old male with past history of GERD, kidney stones, vitamin D deficiency, hypercholesterolemia and chronic ankle and leg pain.  He comes in today for a scheduled follow up. He states he is doing relatively well.  Staying active.  No cardiac symptoms with increased activity or exertion.  Still smoking.  Discussed the need to quit.  No nausea or vomiting.  No bowel change.  Ankle pain is better since changing shoes.  Does request refill for tramadol.  Takes prn - approximately 2-3/week.  Is s/p tick bite.  States has had persistent intermittent irritation since.  No nipple discharge.   He describes a burning pain.  Itches.     Past Medical History  Diagnosis Date  . Vitamin D deficiency   . Chronic leg pain     s/p injury right leg (age 53)  . History of ankle fracture     persistent pain  . Nephrolithiasis     followed by Assunta GamblesBrian Cope  . Hypercholesterolemia   . GERD (gastroesophageal reflux disease)     Outpatient Encounter Prescriptions as of 10/11/2014  Medication Sig  . CRESTOR 10 MG tablet TAKE ONE (1) TABLET EACH DAY  . esomeprazole (NEXIUM) 40 MG capsule Take 20 mg by mouth daily.  . mupirocin cream (BACTROBAN) 2 % Apply 1 application topically 2 (two) times daily.  . traMADol (ULTRAM) 50 MG tablet Take 1 tablet (50 mg total) by mouth daily as needed.  . [DISCONTINUED] doxycycline (VIBRA-TABS) 100 MG tablet Take 1 tablet (100 mg total) by mouth 2 (two) times daily.  . [DISCONTINUED] traMADol (ULTRAM) 50 MG tablet Take 1 tablet (50 mg total) by mouth daily as needed.     Review of Systems Patient denies any headache, lightheadedness or dizziness.  No chest pain, tightness or palpatations.  No increased shortness of breath, cough or congestion.  No nausea or vomiting.  No abdominal pain or cramping.  No bowel change, such as diarrhea, constipation, BRBPR or melana.  No urine  change.  Ankle pain better.  Still takes tramadol prn.  Left nipple irritation as outlined.  Tolerating his cholesterol medication.        Objective:   Physical Exam Filed Vitals:   10/11/14 0914  BP: 122/80  Pulse: 68  Temp: 97.7 F (6836.175 C)   53 year old male in no acute distress.  HEENT:  Nares - clear.  Oropharynx - without lesions. NECK:  Supple.  Nontender.  No audible carotid bruit.  HEART:  Appears to be regular.   LUNGS:  No crackles or wheezing audible.  Respirations even and unlabored.   RADIAL PULSE:  Equal bilaterally.  BREASTS:  No nipple discharge present.  No breast pain to palpation.  Nipple lesions - left breast.  No surrounding erythema.  ABDOMEN:  Soft.  Nontender.  Bowel sounds present and normal.  No audible abdominal bruit.   EXTREMITIES:  No increased edema present.  DP pulses palpable and equal bilaterally.      Assessment & Plan:  HEALTH MAINTENANCE.  Physical 11/16/13.  Discussed the need for colonoscopy.  He is in agreement.  Refer to GI.    Problem List Items Addressed This Visit   Chronic leg pain     Pain mostly localized to his ankle.  Appears to be better now since he changed shoes.  On tramadol.  Tolerating.  Only takes one per day at the most.  Follow.       Relevant Orders      TSH (Completed)   Family history of colon cancer     Father had colon cancer.  Again discussed with him the need for colonoscopy.  Apparently has tried previously with problems.  Had problems with the prep.  We get him referred back for evaluation.       GERD (gastroesophageal reflux disease) - Primary     Continue on nexium.  Controls.  Follow.      Hypercholesterolemia     On crestor.  Tolerating.   Check lipid panel and liver function.       Nipple pain     Nipple irritation as outlined.  bactroban as directed.  Follow closely.      Tobacco abuse     Discussed the need to quit smoking.  He declines.  Will follow.  Plans to cut back.      Vitamin D  deficiency     Recheck vitamin D level.        Other Visit Diagnoses   Prostate cancer screening         check psa.    I spent 25 minutes with the patient and more than 50% of the time was spent in consultation regarding the above.

## 2014-10-11 NOTE — Progress Notes (Signed)
Pre visit review using our clinic review tool, if applicable. No additional management support is needed unless otherwise documented below in the visit note. 

## 2014-10-12 ENCOUNTER — Encounter: Payer: Self-pay | Admitting: Internal Medicine

## 2014-10-12 ENCOUNTER — Encounter: Payer: Self-pay | Admitting: *Deleted

## 2014-10-12 DIAGNOSIS — N644 Mastodynia: Secondary | ICD-10-CM | POA: Insufficient documentation

## 2014-10-12 NOTE — Assessment & Plan Note (Signed)
Nipple irritation as outlined.  bactroban as directed.  Follow closely.

## 2014-10-12 NOTE — Assessment & Plan Note (Signed)
Recheck vitamin D level 

## 2014-10-12 NOTE — Assessment & Plan Note (Signed)
Continue on nexium.  Controls.  Follow.

## 2014-10-12 NOTE — Assessment & Plan Note (Signed)
Pain mostly localized to his ankle.  Appears to be better now since he changed shoes.  On tramadol.  Tolerating.  Only takes one per day at the most.  Follow.

## 2014-10-12 NOTE — Assessment & Plan Note (Signed)
Father had colon cancer.  Again discussed with him the need for colonoscopy.  Apparently has tried previously with problems.  Had problems with the prep.  We get him referred back for evaluation.

## 2014-10-12 NOTE — Assessment & Plan Note (Signed)
Discussed the need to quit smoking.  He declines.  Will follow.  Plans to cut back.

## 2014-10-12 NOTE — Assessment & Plan Note (Signed)
On crestor.  Tolerating.   Check lipid panel and liver function.

## 2014-10-16 ENCOUNTER — Telehealth: Payer: Self-pay | Admitting: Internal Medicine

## 2014-10-16 NOTE — Telephone Encounter (Signed)
Ok with me 

## 2014-10-16 NOTE — Telephone Encounter (Signed)
Pt is requesting to change PCP from Dr Lorin PicketScott to Dr Alphonsus [place]vak. [place] [place] and would be more convenient. Is this ok?

## 2014-10-16 NOTE — Telephone Encounter (Signed)
okay

## 2014-10-16 NOTE — Telephone Encounter (Signed)
Pls disregard. Pt does not want to switch now.

## 2014-10-24 ENCOUNTER — Other Ambulatory Visit: Payer: Self-pay | Admitting: Internal Medicine

## 2015-01-03 ENCOUNTER — Ambulatory Visit (INDEPENDENT_AMBULATORY_CARE_PROVIDER_SITE_OTHER): Payer: No Typology Code available for payment source | Admitting: Internal Medicine

## 2015-01-03 ENCOUNTER — Encounter: Payer: Self-pay | Admitting: Internal Medicine

## 2015-01-03 VITALS — BP 130/80 | HR 66 | Temp 97.6°F | Ht 65.0 in | Wt 147.8 lb

## 2015-01-03 DIAGNOSIS — M79601 Pain in right arm: Secondary | ICD-10-CM

## 2015-01-03 DIAGNOSIS — E7800 Pure hypercholesterolemia, unspecified: Secondary | ICD-10-CM

## 2015-01-03 DIAGNOSIS — M79604 Pain in right leg: Secondary | ICD-10-CM

## 2015-01-03 DIAGNOSIS — G8929 Other chronic pain: Secondary | ICD-10-CM

## 2015-01-03 DIAGNOSIS — K219 Gastro-esophageal reflux disease without esophagitis: Secondary | ICD-10-CM

## 2015-01-03 DIAGNOSIS — N644 Mastodynia: Secondary | ICD-10-CM

## 2015-01-03 DIAGNOSIS — E559 Vitamin D deficiency, unspecified: Secondary | ICD-10-CM

## 2015-01-03 DIAGNOSIS — Z800 Family history of malignant neoplasm of digestive organs: Secondary | ICD-10-CM

## 2015-01-03 DIAGNOSIS — Z720 Tobacco use: Secondary | ICD-10-CM

## 2015-01-03 MED ORDER — TRAMADOL HCL 50 MG PO TABS: 50.0000 mg | ORAL_TABLET | Freq: Every day | ORAL | Status: DC | PRN

## 2015-01-03 NOTE — Progress Notes (Signed)
Pre visit review using our clinic review tool, if applicable. No additional management support is needed unless otherwise documented below in the visit note. 

## 2015-01-05 ENCOUNTER — Encounter: Payer: Self-pay | Admitting: Internal Medicine

## 2015-01-05 NOTE — Progress Notes (Signed)
Subjective:    [name], male    DOB: April 11, 1961, 54 y.o.   MRN: [id number]  HPI 54 year old male with past history of GERD, kidney stones, vitamin D deficiency, hypercholesterolemia and chronic ankle and leg pain.  He comes in today to follow up on these issues as well as for a complete physical exam.  He states he is doing relatively well.  Staying active.  No cardiac symptoms with increased activity or exertion.  Still smoking.  Discussed the need to quit.  He declines.  No nausea or vomiting.  No bowel change.   Does request refill for tramadol.  Takes prn - approximately 2-3/week.  Is s/p tick bite.  States has had persistent intermittent irritation since.  No nipple discharge.  Just nipple pain.  It is some better.   He describes a burning pain. No breast nodules or lumps.  No rash.  Plays golf.      Past Medical History  Diagnosis Date  . Vitamin D deficiency   . Chronic leg pain     s/p injury right leg (age 37)  . History of ankle fracture     persistent pain  . Nephrolithiasis     followed by Assunta Gambles  . Hypercholesterolemia   . GERD (gastroesophageal reflux disease)     Outpatient Encounter Prescriptions as of 01/03/2015  Medication Sig  . CRESTOR 10 MG tablet TAKE ONE (1) TABLET EACH DAY  . esomeprazole (NEXIUM) 40 MG capsule Take 20 mg by mouth daily.  . traMADol (ULTRAM) 50 MG tablet Take 1 tablet (50 mg total) by mouth daily as needed.  . [DISCONTINUED] traMADol (ULTRAM) 50 MG tablet Take 1 tablet (50 mg total) by mouth daily as needed.  . [DISCONTINUED] mupirocin cream (BACTROBAN) 2 % Apply 1 application topically 2 (two) times daily.     Review of Systems Patient denies any lightheadedness or dizziness.  No chest pain, tightness or palpitations.  No increased shortness of breath, cough or congestion.  No nausea or vomiting.  No abdominal pain or cramping.  No bowel change, such as diarrhea, constipation, BRBPR or melana.  No urine change.  Still  takes tramadol prn.  Left nipple irritation as outlined.  Tolerating his cholesterol medication.  Stays active.  Plays golf.        Objective:   Physical Exam  Filed Vitals:   01/03/15 1330  BP: 130/80  Pulse: 66  Temp: 97.6 F (36.4 C)   Blood pressure recheck:  48/1  54 year old male in no acute distress.  HEENT:  Nares - clear.  Oropharynx - without lesions. NECK:  Supple.  Nontender.  No audible carotid bruit.  HEART:  Appears to be regular.   LUNGS:  No crackles or wheezing audible.  Respirations even and unlabored.   RADIAL PULSE:  Equal bilaterally.  BREASTS:  No nipple discharge present.  No breast pain to palpation.  Nipple lesions - left breast.  No surrounding erythema.  ABDOMEN:  Soft.  Nontender.  Bowel sounds present and normal.  No audible abdominal bruit.  GU: he declined.    EXTREMITIES:  No increased edema present.  DP pulses palpable and equal bilaterally.      Assessment & Plan:  HEALTH MAINTENANCE.  Physical today.  I again discussed the need for colonoscopy.  He is in agreement.  He has been referred to GI.  Plans to reschedule.    Gastroesophageal reflux disease, esophagitis presence not specified  Controlled on nexium.    Family history of colon cancer See above.  Will reschedule colonoscopy.    Vitamin D deficiency Continue supplements.   - Vit D  25 hydroxy (rtn osteoporosis monitoring); Future  Hypercholesterolemia Low cholesterol diet and exercise.  On crestor.   - Comprehensive metabolic panel; Future - Lipid panel; Future  Chronic leg pain, right Stable.  Takes tramadol prn.   Tobacco abuse Discussed the need to quit smoking.  Follow.    Nipple pain Persistent.  Is better.  Discussed further w/up.  He declines.     I spent 25 minutes with the patient and more than 50% of the time was spent in consultation regarding the above.

## 2015-02-07 ENCOUNTER — Telehealth: Payer: Self-pay | Admitting: *Deleted

## 2015-02-07 ENCOUNTER — Other Ambulatory Visit: Payer: No Typology Code available for payment source

## 2015-02-07 DIAGNOSIS — E7800 Pure hypercholesterolemia, unspecified: Secondary | ICD-10-CM

## 2015-02-07 NOTE — Telephone Encounter (Signed)
Spoke with pt, he is in agreement with starting Lovastatin.  He also is requesting Tramadol refill.  Please advise refills

## 2015-02-07 NOTE — Telephone Encounter (Signed)
Please discuss with pt.  He hs tried lipitor and pravastatin.  Simvastatin caused a rash.  If wanting to change crestor, can try lovastatin (generic mevacor).  Will need to notify me if any problems with the medication.

## 2015-02-07 NOTE — Telephone Encounter (Signed)
The pharmacy called states under pts new Rx plan his Crestor is going to cost him $258.38 monthly.  Requesting to change medication.  Looks like pt was on Lipitor and Pravastatin before.  Maybe Simvastatin is an option.  Please advise

## 2015-02-08 MED ORDER — LOVASTATIN 20 MG PO TABS: 20.0000 mg | ORAL_TABLET | Freq: Every day | ORAL | Status: DC

## 2015-02-08 MED ORDER — TRAMADOL HCL 50 MG PO TABS: 50.0000 mg | ORAL_TABLET | Freq: Every day | ORAL | Status: DC | PRN

## 2015-02-08 NOTE — Telephone Encounter (Signed)
ok'd refill tramadol #30 with no refills.  D/c'd crestor.  Sent in rx for lovastatin 20mg  - one per day.  He will need a liver panel checked in 6 weeks after starting lovastatin.  Please schedule.

## 2015-02-10 NOTE — Telephone Encounter (Signed)
Spoke with pt, aware of Rx and lab appoint scheduled.

## 2015-02-21 ENCOUNTER — Other Ambulatory Visit: Payer: No Typology Code available for payment source

## 2015-02-28 ENCOUNTER — Ambulatory Visit: Payer: Self-pay | Admitting: Gastroenterology

## 2015-02-28 LAB — HM COLONOSCOPY

## 2015-03-07 ENCOUNTER — Encounter: Payer: Self-pay | Admitting: Internal Medicine

## 2015-03-28 ENCOUNTER — Other Ambulatory Visit (INDEPENDENT_AMBULATORY_CARE_PROVIDER_SITE_OTHER): Payer: No Typology Code available for payment source

## 2015-03-28 DIAGNOSIS — E7800 Pure hypercholesterolemia, unspecified: Secondary | ICD-10-CM

## 2015-03-28 DIAGNOSIS — E559 Vitamin D deficiency, unspecified: Secondary | ICD-10-CM

## 2015-03-28 LAB — COMPREHENSIVE METABOLIC PANEL
ALK PHOS: 58 U/L (ref 39–117)
ALT: 22 U/L (ref 0–53)
AST: 20 U/L (ref 0–37)
Albumin: 4.1 g/dL (ref 3.5–5.2)
BILIRUBIN TOTAL: 0.4 mg/dL (ref 0.2–1.2)
BUN: 18 mg/dL (ref 6–23)
CO2: 31 meq/L (ref 19–32)
CREATININE: 0.94 mg/dL (ref 0.40–1.50)
Calcium: 9.3 mg/dL (ref 8.4–10.5)
Chloride: 103 mEq/L (ref 96–112)
GFR: 88.94 mL/min (ref >60.00)
Glucose, Bld: 103 mg/dL — ABNORMAL HIGH (ref 70–99)
Potassium: 4.3 mEq/L (ref 3.5–5.1)
Sodium: 137 mEq/L (ref 135–145)
TOTAL PROTEIN: 6.7 g/dL (ref 6.0–8.3)

## 2015-03-28 LAB — HEPATIC FUNCTION PANEL
ALBUMIN: 4.1 g/dL (ref 3.5–5.2)
ALT: 22 U/L (ref 0–53)
AST: 20 U/L (ref 0–37)
Alkaline Phosphatase: 58 U/L (ref 39–117)
Bilirubin, Direct: 0.1 mg/dL (ref 0.0–0.3)
Total Bilirubin: 0.4 mg/dL (ref 0.2–1.2)
Total Protein: 6.7 g/dL (ref 6.0–8.3)

## 2015-03-28 LAB — LIPID PANEL
CHOL/HDL RATIO: 4
CHOLESTEROL: 205 mg/dL — AB (ref 0–200)
HDL: 47.3 mg/dL (ref >39.00)
LDL Cholesterol: 142 mg/dL — ABNORMAL HIGH (ref 0–99)
NONHDL: 157.7
Triglycerides: 78 mg/dL (ref 0.0–149.0)
VLDL: 15.6 mg/dL (ref 0.0–40.0)

## 2015-03-28 LAB — VITAMIN D 25 HYDROXY (VIT D DEFICIENCY, FRACTURES): VITD: 18.56 ng/mL — AB (ref 30.00–100.00)

## 2015-03-31 ENCOUNTER — Other Ambulatory Visit: Payer: Self-pay | Admitting: *Deleted

## 2015-03-31 MED ORDER — CHOLECALCIFEROL 25 MCG (1000 UT) PO CAPS: 1000.0000 [IU] | ORAL_CAPSULE | Freq: Every day | ORAL | Status: DC

## 2015-03-31 MED ORDER — LOVASTATIN 40 MG PO TABS: 40.0000 mg | ORAL_TABLET | Freq: Every day | ORAL | Status: DC

## 2015-04-11 ENCOUNTER — Telehealth: Payer: Self-pay | Admitting: *Deleted

## 2015-04-11 ENCOUNTER — Other Ambulatory Visit: Payer: Self-pay | Admitting: *Deleted

## 2015-04-11 MED ORDER — TRAMADOL HCL 50 MG PO TABS: 50.0000 mg | ORAL_TABLET | Freq: Every day | ORAL | Status: DC | PRN

## 2015-04-11 NOTE — Telephone Encounter (Signed)
[facility] & pt notified

## 2015-04-11 NOTE — Telephone Encounter (Signed)
Ok to refill x 1  

## 2015-04-11 NOTE — Telephone Encounter (Signed)
Okay to refill? Last refilled in February #30. Please advise

## 2015-04-11 NOTE — Telephone Encounter (Signed)
[facility] faxed to [facility] & pt notified 

## 2015-04-21 LAB — SURGICAL PATHOLOGY

## 2015-05-09 ENCOUNTER — Other Ambulatory Visit: Payer: Self-pay | Admitting: Internal Medicine

## 2015-05-09 NOTE — Telephone Encounter (Signed)
Rx faxed

## 2015-05-09 NOTE — Telephone Encounter (Signed)
Ok to fill 

## 2015-05-09 NOTE — Telephone Encounter (Signed)
Refilled tramadol #30 with one refill.

## 2015-06-23 ENCOUNTER — Telehealth: Payer: Self-pay | Admitting: *Deleted

## 2015-06-23 ENCOUNTER — Ambulatory Visit (INDEPENDENT_AMBULATORY_CARE_PROVIDER_SITE_OTHER): Payer: No Typology Code available for payment source | Admitting: Nurse Practitioner

## 2015-06-23 VITALS — BP 120/70 | HR 79 | Temp 97.3°F | Resp 18 | Ht 65.0 in | Wt 147.4 lb

## 2015-06-23 DIAGNOSIS — R21 Rash and other nonspecific skin eruption: Secondary | ICD-10-CM | POA: Diagnosis not present

## 2015-06-23 MED ORDER — PREDNISONE 10 MG PO TABS: ORAL_TABLET | ORAL | Status: DC

## 2015-06-23 NOTE — Progress Notes (Signed)
   Subjective:    [name], male    DOB: January 12, 1961, 54 y.o.   MRN: [id number]  HPI  [name] is a 54 yo male with a CC of rash x 2 days.   1) Everywhere, popped up Saturday night, woke up and itching, very pruitic he reports, was in the woods to find balls for golf, sat and Sunday, Hot shower made him feel better. Did not wear anything 2 days in a row.   4 benadryl- not helpful  Hydrocortisone cream- helpful temporarily  Review of Systems  Constitutional: Negative for fever, chills, diaphoresis and fatigue.  Eyes: Negative for visual disturbance.  Respiratory: Negative for cough, chest tightness, shortness of breath and wheezing.   Cardiovascular: Negative for chest pain, palpitations and leg swelling.  Gastrointestinal: Negative for nausea, vomiting and diarrhea.  Skin: Positive for rash.      Objective:   Physical Exam  Constitutional: He is oriented to person, place, and time. He appears well-developed and well-nourished. No distress.  BP 120/70 mmHg  Pulse 79  Temp(Src) 97.3 F (36.3 C)  Resp 18  Ht 5\' 5"  (1.651 m)  Wt 147 lb 6.4 oz (66.86 kg)  BMI 24.53 kg/m2  SpO2 96%   HENT:  Head: Normocephalic and atraumatic.  Right Ear: External ear normal.  Left Ear: External ear normal.  Cardiovascular: Normal rate and regular rhythm.   Pulmonary/Chest: Effort normal and breath sounds normal. No respiratory distress. He has no wheezes. He has no rales. He exhibits no tenderness.  Neurological: He is alert and oriented to person, place, and time.  Skin: Skin is warm and dry. No rash noted. He is not diaphoretic.     Psychiatric: He has a normal mood and affect. His behavior is normal. Judgment and thought content normal.          Assessment & Plan:

## 2015-06-23 NOTE — Progress Notes (Signed)
Pre visit review using our clinic review tool, if applicable. No additional management support is needed unless otherwise documented below in the visit note. 

## 2015-06-23 NOTE — Patient Instructions (Signed)
Prednisone with breakfast  6 tablets on day 1, 5 tablets on day 2, 4 tablets on day 3, 3 tablets on day 4, 2 tablets day 5, 1 tablet on day 6...done!   Benadryl at night 1-2 tablets 30 min. Before bed to help sleep.   Call us if it does not go away after 7 days.

## 2015-06-23 NOTE — Telephone Encounter (Signed)
Left detailed message on VM.

## 2015-06-23 NOTE — Telephone Encounter (Signed)
Chigger bites do look like his rash- that makes sense. Prednisone will help with itching and inflammation. He should still continue the same regimen. Thanks! (Wear bug repellent when golfing from now on).

## 2015-06-23 NOTE — Telephone Encounter (Signed)
Pt called states he believes he has chiggers on his back instead of poison ivy.  He is requesting whether the Prednisone prescribed is effective for chiggers also.  Please advise

## 2015-07-01 ENCOUNTER — Encounter: Payer: Self-pay | Admitting: Nurse Practitioner

## 2015-07-01 DIAGNOSIS — R21 Rash and other nonspecific skin eruption: Secondary | ICD-10-CM | POA: Insufficient documentation

## 2015-07-01 NOTE — Assessment & Plan Note (Signed)
Worsening. Believed to be chigger bites after discussing with pt. Will try prednisone taper to help with intense pruritus. FU prn worsening/failure to improve.

## 2015-07-04 ENCOUNTER — Encounter: Payer: Self-pay | Admitting: Internal Medicine

## 2015-07-04 ENCOUNTER — Ambulatory Visit (INDEPENDENT_AMBULATORY_CARE_PROVIDER_SITE_OTHER): Payer: No Typology Code available for payment source | Admitting: Internal Medicine

## 2015-07-04 VITALS — BP 110/60 | HR 87 | Temp 98.1°F | Ht 65.0 in | Wt 148.5 lb

## 2015-07-04 DIAGNOSIS — R21 Rash and other nonspecific skin eruption: Secondary | ICD-10-CM

## 2015-07-04 DIAGNOSIS — Z800 Family history of malignant neoplasm of digestive organs: Secondary | ICD-10-CM

## 2015-07-04 DIAGNOSIS — K219 Gastro-esophageal reflux disease without esophagitis: Secondary | ICD-10-CM

## 2015-07-04 DIAGNOSIS — E7800 Pure hypercholesterolemia, unspecified: Secondary | ICD-10-CM

## 2015-07-04 DIAGNOSIS — Z720 Tobacco use: Secondary | ICD-10-CM

## 2015-07-04 DIAGNOSIS — E559 Vitamin D deficiency, unspecified: Secondary | ICD-10-CM

## 2015-07-04 DIAGNOSIS — Z0000 Encounter for general adult medical examination without abnormal findings: Secondary | ICD-10-CM

## 2015-07-04 NOTE — Patient Instructions (Signed)
Zyrtec - one per day 

## 2015-07-04 NOTE — Progress Notes (Signed)
[name], male   DOB: July 12, 1961, 54 y.o.   MRN: [id number]   Subjective:    [name], male    DOB: 06-23-1961, 54 y.o.   MRN: [id number]  HPI  Patient here for a scheduled follow up.  His main complaint is that of rash and itching from chigger bites.  He has tried multiple home remedies.  Will improve temporarily and then return.   Oral steroids helped.  Increased itching.  Stays active.  No cardiac symptoms with increased activity or exertion.  No sob.  Eating and drinking well.  Bowels stable. Recently increased lovastatin to  q day.  He is tolerating.     Past Medical History  Diagnosis Date  . Vitamin D deficiency   . Chronic leg pain     s/p injury right leg (age 54)  . History of ankle fracture     persistent pain  . Nephrolithiasis     followed by [facility] Gambles  . Hypercholesterolemia   . GERD (gastroesophageal reflux disease)     Current Outpatient Prescriptions on File Prior to Visit  Medication Sig Dispense Refill  . Cholecalciferol 1000 UNITS capsule Take 1 capsule (1,000 Units total) by mouth daily. 30 capsule 11  . esomeprazole (NEXIUM) 40 MG capsule Take 20 mg by mouth daily.    Marland Kitchen lovastatin (MEVACOR) 40 MG tablet Take 1 tablet (40 mg total) by mouth at bedtime. 30 tablet 5  . traMADol (ULTRAM) 50 MG tablet TAKE ONE (1) TABLET EACH DAY AS NEEDED 30 tablet 1   No current facility-administered medications on file prior to visit.    Review of Systems  Constitutional: Negative for appetite change and unexpected weight change.  HENT: Negative for congestion and sinus pressure.   Respiratory: Negative for cough, chest tightness and shortness of breath.   Cardiovascular: Negative for chest pain, palpitations and leg swelling.  Gastrointestinal: Negative for nausea, vomiting, abdominal pain and diarrhea.  Genitourinary: Negative for dysuria and difficulty urinating.  Skin: Negative for color change and rash.  Neurological: Negative  for dizziness, light-headedness and headaches.  Hematological: Negative for adenopathy. Does not bruise/bleed easily.  Psychiatric/Behavioral: Negative for dysphoric mood and agitation.       Objective:    Physical Exam  Constitutional: He appears well-developed and well-nourished. No distress.  HENT:  Nose: Nose normal.  Mouth/Throat: Oropharynx is clear and moist.  Eyes: Right eye exhibits no discharge. Left eye exhibits no discharge. No scleral icterus.  Neck: Neck supple. No JVD present.  Cardiovascular: Normal rate and regular rhythm.   Pulmonary/Chest: Effort normal and breath sounds normal. No respiratory distress.  Abdominal: Soft. Bowel sounds are normal. There is no tenderness.  Musculoskeletal: He exhibits no edema or tenderness.  Lymphadenopathy:    He has no cervical adenopathy.  Skin: No rash noted. No erythema.  Psychiatric: He has a normal mood and affect. His behavior is normal.    BP 110/60 mmHg  Pulse 87  Temp(Src) 98.1 F (36.7 C) (Oral)  Ht  (1.651 m)  Wt 148 lb 8 oz (67.359 kg)  BMI 24.71 kg/m2  SpO2 96% Wt Readings from Last 3 Encounters:  07/04/15 148 lb 8 oz (67.359 kg)  06/23/15 147 lb 6.4 oz (66.86 kg)  01/03/15 147 lb 12 oz (67.019 kg)     Lab Results  Component Value Date   WBC 6.4 10/11/2014   HGB 15.6 10/11/2014   HCT 47.8 10/11/2014   PLT 264.0  10/11/2014   GLUCOSE 103* 03/28/2015   CHOL 205* 03/28/2015   TRIG 78.0 03/28/2015   HDL 47.30 03/28/2015   LDLDIRECT 210.3 11/30/2013   LDLCALC 142* 03/28/2015   ALT 22 03/28/2015   ALT 22 03/28/2015   AST 20 03/28/2015   AST 20 03/28/2015   NA 137 03/28/2015   K 4.3 03/28/2015   CL 103 03/28/2015   CREATININE 0.94 03/28/2015   BUN 18 03/28/2015   CO2 31 03/28/2015   TSH 1.47 10/11/2014   PSA 2.09 10/11/2014       Assessment & Plan:   Problem List Items Addressed This Visit    Family history of colon cancer    Colonoscopy 02/28/15 - polyps as outlined.  Recommended f/u  colonoscopy in five years.       GERD (gastroesophageal reflux disease) - Primary    On nexium.  Controlled.       Health care maintenance    PSA 10/01/14 - 2.09.   Colonoscopy 02/28/15.  Recommended f/u colonoscopy in five years. Physical 01/03/15 - physical.        Hypercholesterolemia    On lovastatin.  Just increased to 40mg  q day.  Follow lipid panel and liver function tests.        Rash   Rash and nonspecific skin eruption    Rash and itching persists.  Try calamine lotion.  Try antihistamine.  Medrol dose pack - 6 day taper.        Tobacco abuse    Needs to quit smoking.        Vitamin D deficiency    Vitamin D supplements.          I spent 25 minutes with the patient and more than 50% of the time was spent in consultation regarding the above.     Dale DurhamSCOTT, Ranie Chinchilla, MD

## 2015-07-04 NOTE — Progress Notes (Signed)
Pre visit review using our clinic review tool, if applicable. No additional management support is needed unless otherwise documented below in the visit note. 

## 2015-07-07 ENCOUNTER — Encounter: Payer: Self-pay | Admitting: Internal Medicine

## 2015-07-07 DIAGNOSIS — R21 Rash and other nonspecific skin eruption: Secondary | ICD-10-CM | POA: Insufficient documentation

## 2015-07-07 DIAGNOSIS — Z0000 Encounter for general adult medical examination without abnormal findings: Secondary | ICD-10-CM | POA: Insufficient documentation

## 2015-07-07 NOTE — Assessment & Plan Note (Signed)
Needs to quit smoking 

## 2015-07-07 NOTE — Assessment & Plan Note (Signed)
Rash and itching persists.  Try calamine lotion.  Try antihistamine.  Medrol dose pack - 6 day taper.

## 2015-07-07 NOTE — Assessment & Plan Note (Signed)
On nexium.  Controlled.   

## 2015-07-07 NOTE — Assessment & Plan Note (Signed)
On lovastatin.  Just increased to  q day.  Follow lipid panel and liver function tests.

## 2015-07-07 NOTE — Assessment & Plan Note (Addendum)
PSA 10/01/14 - 2.09.   Colonoscopy 02/28/15.  Recommended f/u colonoscopy in five years. Physical 01/03/15 - physical.

## 2015-07-07 NOTE — Assessment & Plan Note (Signed)
Colonoscopy 02/28/15 - polyps as outlined.  Recommended f/u colonoscopy in five years.

## 2015-07-07 NOTE — Assessment & Plan Note (Signed)
Vitamin D supplements.  

## 2015-08-20 ENCOUNTER — Other Ambulatory Visit: Payer: Self-pay | Admitting: Internal Medicine

## 2015-08-20 NOTE — Telephone Encounter (Signed)
Last OV 7.8.16, last refill 7.22.16.  Please advise refill

## 2015-08-20 NOTE — Telephone Encounter (Signed)
ok'd refill for tramadol #30 with no refills.  rx signed and on your desk.

## 2015-08-20 NOTE — Telephone Encounter (Signed)
rx faxed

## 2015-09-05 ENCOUNTER — Telehealth: Payer: Self-pay | Admitting: *Deleted

## 2015-09-05 ENCOUNTER — Other Ambulatory Visit (INDEPENDENT_AMBULATORY_CARE_PROVIDER_SITE_OTHER): Payer: No Typology Code available for payment source

## 2015-09-05 DIAGNOSIS — E7800 Pure hypercholesterolemia, unspecified: Secondary | ICD-10-CM

## 2015-09-05 LAB — COMPREHENSIVE METABOLIC PANEL
ALBUMIN: 4.3 g/dL (ref 3.5–5.2)
ALT: 23 U/L (ref 0–53)
AST: 20 U/L (ref 0–37)
Alkaline Phosphatase: 60 U/L (ref 39–117)
BUN: 15 mg/dL (ref 6–23)
CALCIUM: 9.3 mg/dL (ref 8.4–10.5)
CHLORIDE: 104 meq/L (ref 96–112)
CO2: 28 mEq/L (ref 19–32)
CREATININE: 0.85 mg/dL (ref 0.40–1.50)
GFR: 99.73 mL/min (ref >60.00)
Glucose, Bld: 93 mg/dL (ref 70–99)
Potassium: 4.8 mEq/L (ref 3.5–5.1)
Sodium: 139 mEq/L (ref 135–145)
Total Bilirubin: 0.5 mg/dL (ref 0.2–1.2)
Total Protein: 6.5 g/dL (ref 6.0–8.3)

## 2015-09-05 LAB — LIPID PANEL
CHOLESTEROL: 162 mg/dL (ref 0–200)
HDL: 46.7 mg/dL (ref >39.00)
LDL CALC: 99 mg/dL (ref 0–99)
NonHDL: 115.46
TRIGLYCERIDES: 81 mg/dL (ref 0.0–149.0)
Total CHOL/HDL Ratio: 3
VLDL: 16.2 mg/dL (ref 0.0–40.0)

## 2015-09-05 NOTE — Telephone Encounter (Signed)
Orders placed.

## 2015-09-05 NOTE — Telephone Encounter (Signed)
Labs and dx?  

## 2015-09-09 ENCOUNTER — Encounter: Payer: Self-pay | Admitting: *Deleted

## 2015-09-22 ENCOUNTER — Other Ambulatory Visit: Payer: Self-pay | Admitting: Internal Medicine

## 2015-09-22 NOTE — Telephone Encounter (Signed)
Okay to refill? Last seen on 07/04/15 & next appt on: 11/07/15

## 2015-09-22 NOTE — Telephone Encounter (Signed)
[facility]ed [facility] 

## 2015-09-22 NOTE — Telephone Encounter (Signed)
Refilled tramadol #30 with no refills.  rx signed and in basket.

## 2015-10-24 ENCOUNTER — Other Ambulatory Visit: Payer: Self-pay | Admitting: Internal Medicine

## 2015-10-24 NOTE — Telephone Encounter (Signed)
ok'd #30 with one refill for his cholesterol medication and #30 with no refills of ultram.   rx signed and placed on your desk.

## 2015-10-24 NOTE — Telephone Encounter (Signed)
Please advise refill? 

## 2015-10-31 ENCOUNTER — Ambulatory Visit: Payer: No Typology Code available for payment source | Admitting: Internal Medicine

## 2015-11-07 ENCOUNTER — Ambulatory Visit: Payer: No Typology Code available for payment source | Admitting: Internal Medicine

## 2015-11-24 ENCOUNTER — Other Ambulatory Visit: Payer: Self-pay | Admitting: Internal Medicine

## 2015-11-25 NOTE — Telephone Encounter (Signed)
ok'd refill for tramadol #30 with one refill.   

## 2015-11-28 NOTE — Telephone Encounter (Signed)
Rx faxed on 11/29

## 2015-12-05 ENCOUNTER — Encounter: Payer: Self-pay | Admitting: Internal Medicine

## 2015-12-05 ENCOUNTER — Ambulatory Visit (INDEPENDENT_AMBULATORY_CARE_PROVIDER_SITE_OTHER): Payer: 59 | Admitting: Internal Medicine

## 2015-12-05 VITALS — BP 130/80 | HR 78 | Temp 97.6°F | Resp 18 | Ht 65.0 in | Wt 148.0 lb

## 2015-12-05 DIAGNOSIS — G8929 Other chronic pain: Secondary | ICD-10-CM

## 2015-12-05 DIAGNOSIS — M79601 Pain in right arm: Secondary | ICD-10-CM

## 2015-12-05 DIAGNOSIS — E7800 Pure hypercholesterolemia, unspecified: Secondary | ICD-10-CM

## 2015-12-05 DIAGNOSIS — Z720 Tobacco use: Secondary | ICD-10-CM

## 2015-12-05 DIAGNOSIS — R51 Headache: Secondary | ICD-10-CM

## 2015-12-05 DIAGNOSIS — Z800 Family history of malignant neoplasm of digestive organs: Secondary | ICD-10-CM | POA: Diagnosis not present

## 2015-12-05 DIAGNOSIS — R519 Headache, unspecified: Secondary | ICD-10-CM

## 2015-12-05 DIAGNOSIS — K219 Gastro-esophageal reflux disease without esophagitis: Secondary | ICD-10-CM

## 2015-12-05 DIAGNOSIS — M79604 Pain in right leg: Secondary | ICD-10-CM

## 2015-12-05 DIAGNOSIS — Z125 Encounter for screening for malignant neoplasm of prostate: Secondary | ICD-10-CM

## 2015-12-05 NOTE — Progress Notes (Signed)
Pre-visit discussion using our clinic review tool. No additional management support is needed unless otherwise documented below in the visit note.  

## 2015-12-05 NOTE — Patient Instructions (Addendum)
nasacort nasal spray - 2 sprays each nostril one time per day.    Robitussin twice a day as needed.    Decrease caffeine intake and BC powders.

## 2015-12-05 NOTE — Progress Notes (Signed)
Patient ID: IMER FOXWORTH, male   DOB: [birth year], 54 y.o.   MRN: [id number]   Subjective:    Patient ID: THANG FLETT, male    DOB: September 23, 1961, 54 y.o.   MRN: [id number]  HPI  Patient with past history of hypercholesterolemia, GERD and history of previous ankle fracture.  He comes in today to follow up on these issues.  He reports he is doing well.  Has been having some headaches recently.  He feels related to sinus headaches.  Has some allergy symptoms.  Stays active.  No cardiac symptoms with increased activity or exertion.  No sob.  No acid reflux.  No abdominal pain or cramping.  Bowels stable.  Still with increased ankle pain.  Takes tramadol - one daily.  Helps.  Tolerating cholesterol medication.  Cholesterol doing well.     Past Medical History  Diagnosis Date  . Vitamin D deficiency   . Chronic leg pain     s/p injury right leg (age 14)  . History of ankle fracture     persistent pain  . Nephrolithiasis     followed by Assunta Gambles  . Hypercholesterolemia   . GERD (gastroesophageal reflux disease)    Past Surgical History  Procedure Laterality Date  . Abdominal surgery      after accident.  repaired spleen  . Leg surgery     Family History  Problem Relation Age of Onset  . Colon cancer Father   . Heart disease Father     has a pacemaker and defibrillator  . Hypertension Father   . Diabetes Father    Social History   Social History  . Marital Status: Married    Spouse Name: N/A  . Number of Children: N/A  . Years of Education: N/A   Social History Main Topics  . Smoking status: Current Every Day Smoker  . Smokeless tobacco: Former Neurosurgeon  . Alcohol Use: No  . Drug Use: No  . Sexual Activity: Not Asked   Other Topics Concern  . None   Social History Narrative    Outpatient Encounter Prescriptions as of 12/05/2015  Medication Sig  . Cholecalciferol 1000 UNITS capsule Take 1 capsule (1,000 Units total) by mouth daily.  Marland Kitchen esomeprazole (NEXIUM) 40 MG  capsule Take 20 mg by mouth daily.  Marland Kitchen lovastatin (MEVACOR) 40 MG tablet TAKE ONE (1) TABLET AT BEDTIME  . traMADol (ULTRAM) 50 MG tablet TAKE ONE TABLET BY MOUTH DAILY AS NEEDED   No facility-administered encounter medications on file as of 12/05/2015.    Review of Systems  Constitutional: Negative for appetite change and unexpected weight change.  HENT:       Some nasal congestion and drainage.    Eyes: Negative for discharge and redness.  Respiratory: Negative for cough, chest tightness and shortness of breath.   Cardiovascular: Negative for chest pain, palpitations and leg swelling.  Gastrointestinal: Negative for nausea, vomiting, abdominal pain and diarrhea.  Genitourinary: Negative for dysuria and difficulty urinating.  Musculoskeletal: Negative for back pain.       Persistent ankle pain as outlined.    Skin: Negative for color change and rash.  Neurological: Positive for headaches (feels related to sinus headaches.  no headache for the last two days. ). Negative for dizziness and light-headedness.  Psychiatric/Behavioral: Negative for dysphoric mood and agitation.       Objective:    Physical Exam  Constitutional: He appears well-developed and well-nourished. No distress.  HENT:  Mouth/Throat: Oropharynx is  clear and moist.  Nares - slightly erythematous turbinates.    Eyes: Conjunctivae are normal. Right eye exhibits no discharge. Left eye exhibits no discharge.  Neck: Neck supple. No thyromegaly present.  Cardiovascular: Normal rate and regular rhythm.   Pulmonary/Chest: Effort normal and breath sounds normal. No respiratory distress.  Abdominal: Soft. Bowel sounds are normal. There is no tenderness.  Musculoskeletal: He exhibits no edema or tenderness.  Lymphadenopathy:    He has no cervical adenopathy.  Skin: No rash noted. No erythema.  Psychiatric: He has a normal mood and affect. His behavior is normal.    BP 130/80 mmHg  Pulse 78  Temp(Src) 97.6 F (36.4 C)  (Oral)  Resp 18  Ht 5\' 5"  (1.651 m)  Wt 148 lb (67.132 kg)  BMI 24.63 kg/m2  SpO2 96% Wt Readings from Last 3 Encounters:  12/05/15 148 lb (67.132 kg)  07/04/15 148 lb 8 oz (67.359 kg)  06/23/15 147 lb 6.4 oz (66.86 kg)     Lab Results  Component Value Date   WBC 6.4 10/11/2014   HGB 15.6 10/11/2014   HCT 47.8 10/11/2014   PLT 264.0 10/11/2014   GLUCOSE 93 09/05/2015   CHOL 162 09/05/2015   TRIG 81.0 09/05/2015   HDL 46.70 09/05/2015   LDLDIRECT 210.3 11/30/2013   LDLCALC 99 09/05/2015   ALT 23 09/05/2015   AST 20 09/05/2015   NA 139 09/05/2015   K 4.8 09/05/2015   CL 104 09/05/2015   CREATININE 0.85 09/05/2015   BUN 15 09/05/2015   CO2 28 09/05/2015   TSH 1.47 10/11/2014   PSA 2.09 10/11/2014       Assessment & Plan:   Problem List Items Addressed This Visit    Chronic leg pain    Persistent ankle and leg pain as outlined.  Tramadol - one daily.  Follow.        Family history of colon cancer    Colonoscopy 02/28/15 - as outlined.  Recommended f/u in five years.        GERD (gastroesophageal reflux disease)    On nexium.  Controlled.        Headache    No headache for the last two days.  Feels related to sinus and allergies.  nasacort nasal spray as directed.  Robitussin as directed.  Follow.        Hypercholesterolemia - Primary    On lovastatin.  Low cholesterol diet and exercise.  Follow lipid panel and liver function tests.        Relevant Orders   CBC with Differential/Platelet   TSH   Lipid panel   Comprehensive metabolic panel   Tobacco abuse    Discussed the need to quit smoking.  He desires not to quit at this time.  Follow.         Other Visit Diagnoses    Prostate cancer screening        Relevant Orders    PSA        Dale DurhamSCOTT, Ruby Logiudice, MD

## 2015-12-07 ENCOUNTER — Encounter: Payer: Self-pay | Admitting: Internal Medicine

## 2015-12-07 DIAGNOSIS — R51 Headache: Secondary | ICD-10-CM

## 2015-12-07 DIAGNOSIS — R519 Headache, unspecified: Secondary | ICD-10-CM | POA: Insufficient documentation

## 2015-12-07 NOTE — Assessment & Plan Note (Signed)
Colonoscopy 02/28/15 - as outlined.  Recommended f/u in five years.

## 2015-12-07 NOTE — Assessment & Plan Note (Signed)
No headache for the last two days.  Feels related to sinus and allergies.  nasacort nasal spray as directed.  Robitussin as directed.  Follow.

## 2015-12-07 NOTE — Assessment & Plan Note (Signed)
Persistent ankle and leg pain as outlined.  Tramadol - one daily.  Follow.

## 2015-12-07 NOTE — Assessment & Plan Note (Signed)
Discussed the need to quit smoking.  He desires not to quit at this time.  Follow.  

## 2015-12-07 NOTE — Assessment & Plan Note (Signed)
On lovastatin.  Low cholesterol diet and exercise.  Follow lipid panel and liver function tests.   

## 2015-12-07 NOTE — Assessment & Plan Note (Signed)
On nexium.  Controlled.   

## 2015-12-19 ENCOUNTER — Other Ambulatory Visit (INDEPENDENT_AMBULATORY_CARE_PROVIDER_SITE_OTHER): Payer: 59

## 2015-12-19 DIAGNOSIS — E7800 Pure hypercholesterolemia, unspecified: Secondary | ICD-10-CM

## 2015-12-19 DIAGNOSIS — Z125 Encounter for screening for malignant neoplasm of prostate: Secondary | ICD-10-CM | POA: Diagnosis not present

## 2015-12-19 LAB — LIPID PANEL
CHOLESTEROL: 200 mg/dL (ref 0–200)
HDL: 51.6 mg/dL (ref >39.00)
LDL Cholesterol: 136 mg/dL — ABNORMAL HIGH (ref 0–99)
NONHDL: 148.89
Total CHOL/HDL Ratio: 4
Triglycerides: 62 mg/dL (ref 0.0–149.0)
VLDL: 12.4 mg/dL (ref 0.0–40.0)

## 2015-12-19 LAB — CBC WITH DIFFERENTIAL/PLATELET
BASOS PCT: 0.5 % (ref 0.0–3.0)
Basophils Absolute: 0 10*3/uL (ref 0.0–0.1)
EOS ABS: 0.1 10*3/uL (ref 0.0–0.7)
EOS PCT: 1 % (ref 0.0–5.0)
HCT: 48.4 % (ref 39.0–52.0)
Hemoglobin: 16 g/dL (ref 13.0–17.0)
LYMPHS ABS: 1.6 10*3/uL (ref 0.7–4.0)
Lymphocytes Relative: 22.4 % (ref 12.0–46.0)
MCHC: 33.1 g/dL (ref 30.0–36.0)
MCV: 90.5 fl (ref 78.0–100.0)
MONO ABS: 0.5 10*3/uL (ref 0.1–1.0)
Monocytes Relative: 6.4 % (ref 3.0–12.0)
NEUTROS PCT: 69.7 % (ref 43.0–77.0)
Neutro Abs: 5 10*3/uL (ref 1.4–7.7)
PLATELETS: 283 10*3/uL (ref 150.0–400.0)
RBC: 5.34 Mil/uL (ref 4.22–5.81)
RDW: 13.9 % (ref 11.5–15.5)
WBC: 7.2 10*3/uL (ref 4.0–10.5)

## 2015-12-19 LAB — COMPREHENSIVE METABOLIC PANEL
ALT: 32 U/L (ref 0–53)
AST: 23 U/L (ref 0–37)
Albumin: 4.3 g/dL (ref 3.5–5.2)
Alkaline Phosphatase: 60 U/L (ref 39–117)
BUN: 18 mg/dL (ref 6–23)
CO2: 29 meq/L (ref 19–32)
Calcium: 9.4 mg/dL (ref 8.4–10.5)
Chloride: 103 mEq/L (ref 96–112)
Creatinine, Ser: 0.85 mg/dL (ref 0.40–1.50)
GFR: 99.62 mL/min (ref >60.00)
GLUCOSE: 108 mg/dL — AB (ref 70–99)
POTASSIUM: 4.6 meq/L (ref 3.5–5.1)
SODIUM: 140 meq/L (ref 135–145)
Total Bilirubin: 0.4 mg/dL (ref 0.2–1.2)
Total Protein: 6.7 g/dL (ref 6.0–8.3)

## 2015-12-19 LAB — PSA: PSA: 2.23 ng/mL (ref 0.10–4.00)

## 2015-12-19 LAB — TSH: TSH: 1.34 u[IU]/mL (ref 0.35–4.50)

## 2015-12-22 ENCOUNTER — Other Ambulatory Visit: Payer: Self-pay | Admitting: Internal Medicine

## 2015-12-22 DIAGNOSIS — R739 Hyperglycemia, unspecified: Secondary | ICD-10-CM

## 2015-12-22 NOTE — Progress Notes (Signed)
Order placed for f/u labs.  

## 2015-12-23 ENCOUNTER — Other Ambulatory Visit: Payer: Self-pay | Admitting: Internal Medicine

## 2016-01-23 ENCOUNTER — Encounter: Payer: Self-pay | Admitting: *Deleted

## 2016-01-23 ENCOUNTER — Other Ambulatory Visit (INDEPENDENT_AMBULATORY_CARE_PROVIDER_SITE_OTHER): Payer: 59

## 2016-01-23 DIAGNOSIS — R739 Hyperglycemia, unspecified: Secondary | ICD-10-CM

## 2016-01-23 LAB — HEMOGLOBIN A1C: Hgb A1c MFr Bld: 6.1 % (ref 4.6–6.5)

## 2016-01-24 LAB — GLUCOSE, FASTING: GLUCOSE, FASTING: 97 mg/dL (ref 65–99)

## 2016-01-26 ENCOUNTER — Other Ambulatory Visit: Payer: Self-pay | Admitting: Internal Medicine

## 2016-01-26 NOTE — Telephone Encounter (Signed)
[facility]ed [facility] 

## 2016-01-26 NOTE — Telephone Encounter (Signed)
Patient last seen in December and medication last filled in November. Please advise?

## 2016-01-26 NOTE — Telephone Encounter (Signed)
ok'd refill tramadol #30 with one refill.

## 2016-03-15 ENCOUNTER — Other Ambulatory Visit: Payer: Self-pay | Admitting: Internal Medicine

## 2016-03-16 NOTE — Telephone Encounter (Signed)
ok'd tramadol #30 with one refill.

## 2016-03-16 NOTE — Telephone Encounter (Signed)
Faxed RX to pharmacy.  

## 2016-03-16 NOTE — Telephone Encounter (Signed)
Please advise on refill. Patient last seen 11/2015

## 2016-04-09 ENCOUNTER — Encounter: Payer: 59 | Admitting: Internal Medicine

## 2016-05-07 ENCOUNTER — Encounter: Payer: Self-pay | Admitting: Internal Medicine

## 2016-05-07 ENCOUNTER — Ambulatory Visit (INDEPENDENT_AMBULATORY_CARE_PROVIDER_SITE_OTHER): Payer: 59 | Admitting: Internal Medicine

## 2016-05-07 VITALS — BP 120/80 | HR 86 | Temp 97.5°F | Resp 18 | Ht 65.0 in | Wt 145.2 lb

## 2016-05-07 DIAGNOSIS — R51 Headache: Secondary | ICD-10-CM | POA: Diagnosis not present

## 2016-05-07 DIAGNOSIS — M79604 Pain in right leg: Secondary | ICD-10-CM

## 2016-05-07 DIAGNOSIS — E7800 Pure hypercholesterolemia, unspecified: Secondary | ICD-10-CM

## 2016-05-07 DIAGNOSIS — G8929 Other chronic pain: Secondary | ICD-10-CM

## 2016-05-07 DIAGNOSIS — M2550 Pain in unspecified joint: Secondary | ICD-10-CM | POA: Diagnosis not present

## 2016-05-07 DIAGNOSIS — E559 Vitamin D deficiency, unspecified: Secondary | ICD-10-CM | POA: Diagnosis not present

## 2016-05-07 DIAGNOSIS — R519 Headache, unspecified: Secondary | ICD-10-CM

## 2016-05-07 DIAGNOSIS — Z720 Tobacco use: Secondary | ICD-10-CM | POA: Diagnosis not present

## 2016-05-07 DIAGNOSIS — R739 Hyperglycemia, unspecified: Secondary | ICD-10-CM

## 2016-05-07 DIAGNOSIS — M79601 Pain in right arm: Secondary | ICD-10-CM

## 2016-05-07 DIAGNOSIS — Z0000 Encounter for general adult medical examination without abnormal findings: Secondary | ICD-10-CM | POA: Diagnosis not present

## 2016-05-07 DIAGNOSIS — K219 Gastro-esophageal reflux disease without esophagitis: Secondary | ICD-10-CM

## 2016-05-07 LAB — CBC WITH DIFFERENTIAL/PLATELET
BASOS PCT: 0.5 % (ref 0.0–3.0)
Basophils Absolute: 0 10*3/uL (ref 0.0–0.1)
EOS ABS: 0.1 10*3/uL (ref 0.0–0.7)
EOS PCT: 1.2 % (ref 0.0–5.0)
HEMATOCRIT: 46.8 % (ref 39.0–52.0)
HEMOGLOBIN: 15.5 g/dL (ref 13.0–17.0)
LYMPHS PCT: 22.2 % (ref 12.0–46.0)
Lymphs Abs: 1.8 10*3/uL (ref 0.7–4.0)
MCHC: 33.2 g/dL (ref 30.0–36.0)
MCV: 90.9 fl (ref 78.0–100.0)
Monocytes Absolute: 0.7 10*3/uL (ref 0.1–1.0)
Monocytes Relative: 8.6 % (ref 3.0–12.0)
Neutro Abs: 5.4 10*3/uL (ref 1.4–7.7)
Neutrophils Relative %: 67.5 % (ref 43.0–77.0)
Platelets: 259 10*3/uL (ref 150.0–400.0)
RBC: 5.14 Mil/uL (ref 4.22–5.81)
RDW: 14.1 % (ref 11.5–15.5)
WBC: 8.1 10*3/uL (ref 4.0–10.5)

## 2016-05-07 LAB — VITAMIN D 25 HYDROXY (VIT D DEFICIENCY, FRACTURES): VITD: 24.04 ng/mL — AB (ref 30.00–100.00)

## 2016-05-07 LAB — LIPID PANEL
Cholesterol: 191 mg/dL (ref 0–200)
HDL: 40.7 mg/dL (ref >39.00)
LDL CALC: 134 mg/dL — AB (ref 0–99)
NONHDL: 150.41
Total CHOL/HDL Ratio: 5
Triglycerides: 82 mg/dL (ref 0.0–149.0)
VLDL: 16.4 mg/dL (ref 0.0–40.0)

## 2016-05-07 LAB — HEPATIC FUNCTION PANEL
ALT: 21 U/L (ref 0–53)
AST: 17 U/L (ref 0–37)
Albumin: 4.5 g/dL (ref 3.5–5.2)
Alkaline Phosphatase: 63 U/L (ref 39–117)
BILIRUBIN TOTAL: 0.5 mg/dL (ref 0.2–1.2)
Bilirubin, Direct: 0.1 mg/dL (ref 0.0–0.3)
Total Protein: 6.7 g/dL (ref 6.0–8.3)

## 2016-05-07 LAB — BASIC METABOLIC PANEL
BUN: 16 mg/dL (ref 6–23)
CHLORIDE: 103 meq/L (ref 96–112)
CO2: 29 mEq/L (ref 19–32)
CREATININE: 0.85 mg/dL (ref 0.40–1.50)
Calcium: 9.5 mg/dL (ref 8.4–10.5)
GFR: 99.48 mL/min (ref >60.00)
Glucose, Bld: 109 mg/dL — ABNORMAL HIGH (ref 70–99)
POTASSIUM: 4.5 meq/L (ref 3.5–5.1)
Sodium: 140 mEq/L (ref 135–145)

## 2016-05-07 LAB — TSH: TSH: 1.19 u[IU]/mL (ref 0.35–4.50)

## 2016-05-07 LAB — HEMOGLOBIN A1C: HEMOGLOBIN A1C: 6.2 % (ref 4.6–6.5)

## 2016-05-07 LAB — RHEUMATOID FACTOR

## 2016-05-07 LAB — SEDIMENTATION RATE: SED RATE: 5 mm/h (ref 0–22)

## 2016-05-07 NOTE — Progress Notes (Signed)
[name], male   DOB: 01-26-1961, 55 y.o.   MRN: [id number]   Subjective:    [name], male    DOB: December 20, 1961, 55 y.o.   MRN: [id number]  HPI  Patient here for his physical.  He reports headaches.  Occurs 2-3x/week.  He relates it to increased stress.  Takes BCs.  Resolves headaches.  Headaches not severe. Discussed the need for eye exam.   Reports some pain base of great toes and both hands.  Joint aches.  No chest pain.  No sob.  No chest tightness.  Takes ultram for pain.  Takes one per day.  No nausea or vomiting.  Bowels stable.    Past Medical History  Diagnosis Date  . Vitamin D deficiency   . Chronic leg pain     s/p injury right leg (age 24)  . History of ankle fracture     persistent pain  . Nephrolithiasis     followed by Edrick Oh  . Hypercholesterolemia   . GERD (gastroesophageal reflux disease)    Past Surgical History  Procedure Laterality Date  . Abdominal surgery      after accident.  repaired spleen  . Leg surgery     Family History  Problem Relation Age of Onset  . Colon cancer Father   . Heart disease Father     has a pacemaker and defibrillator  . Hypertension Father   . Diabetes Father    Social History   Social History  . Marital Status: Married    Spouse Name: N/A  . Number of Children: N/A  . Years of Education: N/A   Social History Main Topics  . Smoking status: Current Every Day Smoker  . Smokeless tobacco: Former Systems developer  . Alcohol Use: No  . Drug Use: No  . Sexual Activity: Not Asked   Other Topics Concern  . None   Social History Narrative    Outpatient Encounter Prescriptions as of 05/07/2016  Medication Sig  . Cholecalciferol 1000 UNITS capsule Take 1 capsule (1,000 Units total) by mouth daily.  Marland Kitchen esomeprazole (NEXIUM) 40 MG capsule Take 20 mg by mouth daily.  Marland Kitchen lovastatin (MEVACOR) 40 MG tablet TAKE ONE (1) TABLET AT BEDTIME  . traMADol (ULTRAM) 50 MG tablet TAKE ONE (1) TABLET EACH DAY AS  NEEDED   No facility-administered encounter medications on file as of 05/07/2016.    Review of Systems  Constitutional: Negative for appetite change and unexpected weight change.  HENT: Negative for congestion and sinus pressure.   Eyes: Negative for pain and visual disturbance.  Respiratory: Negative for cough, chest tightness and shortness of breath.   Cardiovascular: Negative for chest pain, palpitations and leg swelling.  Gastrointestinal: Negative for nausea, vomiting, abdominal pain and diarrhea.  Genitourinary: Negative for dysuria and difficulty urinating.  Musculoskeletal:       Pain base of great toes.  Bilateral hand - stiffness.    Skin: Negative for color change and rash.  Neurological: Positive for headaches. Negative for dizziness and light-headedness.  Hematological: Negative for adenopathy. Does not bruise/bleed easily.  Psychiatric/Behavioral: Negative for dysphoric mood and agitation.       Objective:    Physical Exam  Constitutional: He is oriented to person, place, and time. He appears well-developed and well-nourished. No distress.  HENT:  Head: Normocephalic and atraumatic.  Nose: Nose normal.  Mouth/Throat: Oropharynx is clear and moist. No oropharyngeal exudate.  Eyes: Conjunctivae are normal. Right eye exhibits  no discharge. Left eye exhibits no discharge.  Neck: Neck supple. No thyromegaly present.  Cardiovascular: Normal rate and regular rhythm.   Pulmonary/Chest: Breath sounds normal. No respiratory distress. He has no wheezes.  Abdominal: Soft. Bowel sounds are normal. There is no tenderness.  Genitourinary:  Rectal exam - no palpable prostate nodules.  Heme negative.   Musculoskeletal: He exhibits no edema or tenderness.  Lymphadenopathy:    He has no cervical adenopathy.  Neurological: He is alert and oriented to person, place, and time.  Skin: Skin is warm and dry. No rash noted. No erythema.  Psychiatric: He has a normal mood and affect. His  behavior is normal.    BP 120/80 mmHg  Pulse 86  Temp(Src) 97.5 F (36.4 C) (Oral)  Resp 18  Ht _0  (1.651 m)  Wt 145 lb 4 oz (65.885 kg)  BMI 24.17 kg/m2  SpO2 97% Wt Readings from Last 3 Encounters:  05/07/16 145 lb 4 oz (65.885 kg)  12/05/15 148 lb (67.132 kg)  07/04/15 148 lb 8 oz (67.359 kg)     Lab Results  Component Value Date   WBC 8.1 05/07/2016   HGB 15.5 05/07/2016   HCT 46.8 05/07/2016   PLT 259.0 05/07/2016   GLUCOSE 109* 05/07/2016   CHOL 191 05/07/2016   TRIG 82.0 05/07/2016   HDL 40.70 05/07/2016   LDLDIRECT 210.3 11/30/2013   LDLCALC 134* 05/07/2016   ALT 21 05/07/2016   AST 17 05/07/2016   NA 140 05/07/2016   K 4.5 05/07/2016   CL 103 05/07/2016   CREATININE 0.85 05/07/2016   BUN 16 05/07/2016   CO2 29 05/07/2016   TSH 1.19 05/07/2016   PSA 2.23 12/19/2015   HGBA1C 6.2 05/07/2016       Assessment & Plan:   Problem List Items Addressed This Visit    Chronic leg pain    Persistent ankle and leg pain.  On tramadol daily.  Follow.       GERD (gastroesophageal reflux disease)    On nexium.  Controlled.       Headache    Discussed with him today.  No increased sinus issues.  Will have eyes checked.  Discussed further w/up.  He declines at this time.  Tylenol.  Check labs as outlined.        Relevant Orders   CBC with Differential/Platelet (Completed)   Health care maintenance    Physical today 05/07/16.  Check psa today.  Colonoscopy 02/28/15.  Recommended f/u colonoscopy in five years.        Hypercholesterolemia    On lovastatin.  Low cholesterol diet and exercise.  Follow lipid panel and liver function tests.  Had intolerance to other statins.        Relevant Orders   Lipid panel (Completed)   Hepatic function panel (Completed)   Hyperglycemia    Low carb diet and exercise.  Follow met b and a1c.       Relevant Orders   TSH (Completed)   Hemoglobin A1c (Completed)   Basic metabolic panel (Completed)   Joint pain    Pain  as outlined.   Tylenol as directed.  Takes ultram daily.  Check esr and RF.        Relevant Orders   Rheumatoid factor (Completed)   Sedimentation rate (Completed)   Tobacco abuse    Discussed the need to quit smoking.  He plans to quit soon.  Follow.       Vitamin D deficiency -  Primary    Check vitamin D level.        Relevant Orders   VITAMIN D 25 Hydroxy (Vit-D Deficiency, Fractures) (Completed)       Einar Pheasant, MD

## 2016-05-07 NOTE — Progress Notes (Signed)
Pre-visit discussion using our clinic review tool. No additional management support is needed unless otherwise documented below in the visit note.  

## 2016-05-09 ENCOUNTER — Encounter: Payer: Self-pay | Admitting: Internal Medicine

## 2016-05-09 NOTE — Assessment & Plan Note (Signed)
Discussed with him today.  No increased sinus issues.  Will have eyes checked.  Discussed further w/up.  He declines at this time.  Tylenol.  Check labs as outlined.

## 2016-05-09 NOTE — Assessment & Plan Note (Signed)
On nexium.  Controlled.   

## 2016-05-09 NOTE — Assessment & Plan Note (Signed)
Low carb diet and exercise.  Follow met b and a1c.

## 2016-05-09 NOTE — Assessment & Plan Note (Signed)
Check vitamin D level 

## 2016-05-09 NOTE — Assessment & Plan Note (Signed)
Discussed the need to quit smoking.  He plans to quit soon.  Follow.

## 2016-05-09 NOTE — Assessment & Plan Note (Signed)
Physical today 05/07/16.  Check psa today.  Colonoscopy 02/28/15.  Recommended f/u colonoscopy in five years.

## 2016-05-09 NOTE — Assessment & Plan Note (Signed)
Persistent ankle and leg pain.  On tramadol daily.  Follow.

## 2016-05-09 NOTE — Assessment & Plan Note (Signed)
On lovastatin.  Low cholesterol diet and exercise.  Follow lipid panel and liver function tests.  Had intolerance to other statins.

## 2016-05-09 NOTE — Assessment & Plan Note (Signed)
Pain as outlined.   Tylenol as directed.  Takes ultram daily.  Check esr and RF.

## 2016-05-10 ENCOUNTER — Encounter: Payer: Self-pay | Admitting: *Deleted

## 2016-05-11 ENCOUNTER — Other Ambulatory Visit: Payer: Self-pay | Admitting: Internal Medicine

## 2016-05-11 NOTE — Telephone Encounter (Signed)
Refilled 03/16/16. Last seen 05/07/16. Please advise?

## 2016-05-12 NOTE — Telephone Encounter (Signed)
Rx faxed to pharmacy  

## 2016-07-27 ENCOUNTER — Other Ambulatory Visit: Payer: Self-pay | Admitting: Internal Medicine

## 2016-07-28 ENCOUNTER — Other Ambulatory Visit: Payer: Self-pay | Admitting: *Deleted

## 2016-07-28 MED ORDER — TRAMADOL HCL 50 MG PO TABS: ORAL_TABLET | ORAL | 0 refills | Status: DC

## 2016-07-28 NOTE — Telephone Encounter (Signed)
Okay to refill Tramadol? Last approved on 05/12/16 with #30 and 1 refill. Last OV: 05/07/16 & next OV: 09/10/16.

## 2016-08-10 NOTE — Telephone Encounter (Signed)
[facility]

## 2016-09-10 ENCOUNTER — Encounter: Payer: Self-pay | Admitting: Internal Medicine

## 2016-09-10 ENCOUNTER — Encounter (INDEPENDENT_AMBULATORY_CARE_PROVIDER_SITE_OTHER): Payer: Self-pay

## 2016-09-10 ENCOUNTER — Ambulatory Visit (INDEPENDENT_AMBULATORY_CARE_PROVIDER_SITE_OTHER): Payer: 59 | Admitting: Internal Medicine

## 2016-09-10 DIAGNOSIS — Z720 Tobacco use: Secondary | ICD-10-CM | POA: Diagnosis not present

## 2016-09-10 DIAGNOSIS — R739 Hyperglycemia, unspecified: Secondary | ICD-10-CM

## 2016-09-10 DIAGNOSIS — E7800 Pure hypercholesterolemia, unspecified: Secondary | ICD-10-CM | POA: Diagnosis not present

## 2016-09-10 DIAGNOSIS — E559 Vitamin D deficiency, unspecified: Secondary | ICD-10-CM | POA: Diagnosis not present

## 2016-09-10 DIAGNOSIS — K219 Gastro-esophageal reflux disease without esophagitis: Secondary | ICD-10-CM

## 2016-09-10 DIAGNOSIS — M79604 Pain in right leg: Secondary | ICD-10-CM

## 2016-09-10 DIAGNOSIS — M79601 Pain in right arm: Secondary | ICD-10-CM

## 2016-09-10 DIAGNOSIS — G8929 Other chronic pain: Secondary | ICD-10-CM

## 2016-09-10 DIAGNOSIS — M79602 Pain in left arm: Secondary | ICD-10-CM

## 2016-09-10 MED ORDER — TRAMADOL HCL 50 MG PO TABS: ORAL_TABLET | ORAL | 1 refills | Status: DC

## 2016-09-10 NOTE — Progress Notes (Signed)
[name], male   DOB: Dec 03, 1961, 55 y.o.   MRN: [id number]   Subjective:    [name], male    DOB: Dec 03, 1961, 55 y.o.   MRN: [id number]  HPI  Patient here for a scheduled follow up.  States he is doing relatively well.  Ankle pain stable.  He is having increased left upper arm pain.  Does not hurt if sitting with arm relaxed.  Increased pain with straightening his arm.  Does lifting with his arm.  Hurts when in a strained position.  Has been persistent. No known injury.  No chest pain.  No sob.  No acid reflux.  No abdominal pain.  Occasional neck discomfort, but no significant pain now.  No significant limited rom.  No chest pain.  No sob.  Discussed the need to stop smoking.  He declines.  No abdominal pain.  Bowels stable.  No blood.    Past Medical History:  Diagnosis Date  . Chronic leg pain    s/p injury right leg (age 55)  . GERD (gastroesophageal reflux disease)   . History of ankle fracture    persistent pain  . Hypercholesterolemia   . Nephrolithiasis    followed by Assunta GamblesBrian Cope  . Vitamin D deficiency    Past Surgical History:  Procedure Laterality Date  . ABDOMINAL SURGERY     after accident.  repaired spleen  . LEG SURGERY     Family History  Problem Relation Age of Onset  . Colon cancer Father   . Heart disease Father     has a pacemaker and defibrillator  . Hypertension Father   . Diabetes Father    Social History   Social History  . Marital status: Married    Spouse name: N/A  . Number of children: N/A  . Years of education: N/A   Social History Main Topics  . Smoking status: Current Every Day Smoker  . Smokeless tobacco: Former NeurosurgeonUser  . Alcohol use No  . Drug use: No  . Sexual activity: Not Asked   Other Topics Concern  . None   Social History Narrative  . None    Outpatient Encounter Prescriptions as of 09/10/2016  Medication Sig  . Cholecalciferol 1000 UNITS capsule Take 1 capsule (1,000 Units total) by  mouth daily.  Marland Kitchen. esomeprazole (NEXIUM) 40 MG capsule Take 20 mg by mouth daily.  Marland Kitchen. lovastatin (MEVACOR) 40 MG tablet TAKE ONE (1) TABLET AT BEDTIME  . traMADol (ULTRAM) 50 MG tablet TAKE ONE (1) TABLET EACH DAY AS NEEDED  . [DISCONTINUED] traMADol (ULTRAM) 50 MG tablet TAKE ONE (1) TABLET EACH DAY AS NEEDED   No facility-administered encounter medications on file as of 09/10/2016.     Review of Systems  Constitutional: Negative for appetite change and unexpected weight change.  HENT: Negative for congestion and sinus pressure.   Respiratory: Negative for cough, chest tightness and shortness of breath.   Cardiovascular: Negative for chest pain, palpitations and leg swelling.  Gastrointestinal: Negative for abdominal pain, diarrhea, nausea and vomiting.  Genitourinary: Negative for difficulty urinating and dysuria.  Musculoskeletal:       Left upper arm pain as outlined.  No significant neck pain.   Skin: Negative for color change and rash.  Neurological: Negative for dizziness, light-headedness and headaches.  Psychiatric/Behavioral: Negative for agitation and dysphoric mood.       Objective:    Physical Exam  Constitutional: He appears well-developed and well-nourished. No distress.  HENT:  Nose: Nose normal.  Mouth/Throat: Oropharynx is clear and moist.  Neck: Neck supple. No thyromegaly present.  Cardiovascular: Normal rate and regular rhythm.   Pulmonary/Chest: Effort normal and breath sounds normal. No respiratory distress.  Abdominal: Soft. Bowel sounds are normal. There is no tenderness.  Musculoskeletal: He exhibits no edema.  Some increased discomfort left upper arm with full extension.  Good rom of shoulder.  No significant pain in shoulder with rotation.  No significant neck pain.    Lymphadenopathy:    He has no cervical adenopathy.  Skin: No rash noted. No erythema.  Psychiatric: He has a normal mood and affect. His behavior is normal.    BP 128/70   Pulse 68    Temp 97.6 F (36.4 C) (Oral)   Ht 5\' 5"  (1.651 m)   Wt 149 lb 12.8 oz (67.9 kg)   SpO2 98%   BMI 24.93 kg/m  Wt Readings from Last 3 Encounters:  09/10/16 149 lb 12.8 oz (67.9 kg)  05/07/16 145 lb 4 oz (65.9 kg)  12/05/15 148 lb (67.1 kg)     Lab Results  Component Value Date   WBC 8.1 05/07/2016   HGB 15.5 05/07/2016   HCT 46.8 05/07/2016   PLT 259.0 05/07/2016   GLUCOSE 109 (H) 05/07/2016   CHOL 191 05/07/2016   TRIG 82.0 05/07/2016   HDL 40.70 05/07/2016   LDLDIRECT 210.3 11/30/2013   LDLCALC 134 (H) 05/07/2016   ALT 21 05/07/2016   AST 17 05/07/2016   NA 140 05/07/2016   K 4.5 05/07/2016   CL 103 05/07/2016   CREATININE 0.85 05/07/2016   BUN 16 05/07/2016   CO2 29 05/07/2016   TSH 1.19 05/07/2016   PSA 2.23 12/19/2015   HGBA1C 6.2 05/07/2016       Assessment & Plan:   Problem List Items Addressed This Visit    Chronic leg pain    Tramadol.        GERD (gastroesophageal reflux disease)    Controlled on nexium.        Hypercholesterolemia    Low cholesterol diet and exercise.  On lovastatin.  Doubt lovastatin causing the arm pain.  Follow.        Hyperglycemia    Low carb diet and exercise.  Follow.        Left arm pain    Worse with full extension of arm and when arm in strained position.  Has tramadol.  Tylenol scheduled.  Follow.        Tobacco abuse    Discussed with him today.  Discussed the need to stop smoking.  Follow.        Vitamin D deficiency    Follow vitamin D level.        Other Visit Diagnoses   None.      Dale Ramsey, MD

## 2016-09-10 NOTE — Progress Notes (Signed)
Pre visit review using our clinic review tool, if applicable. No additional management support is needed unless otherwise documented below in the visit note. 

## 2016-09-12 ENCOUNTER — Encounter: Payer: Self-pay | Admitting: Internal Medicine

## 2016-09-12 DIAGNOSIS — M79602 Pain in left arm: Secondary | ICD-10-CM | POA: Insufficient documentation

## 2016-09-12 NOTE — Assessment & Plan Note (Signed)
Low cholesterol diet and exercise.  On lovastatin.  Doubt lovastatin causing the arm pain.  Follow.

## 2016-09-12 NOTE — Assessment & Plan Note (Signed)
Worse with full extension of arm and when arm in strained position.  Has tramadol.  Tylenol scheduled.  Follow.

## 2016-09-12 NOTE — Assessment & Plan Note (Signed)
Low carb diet and exercise.  Follow.  

## 2016-09-12 NOTE — Assessment & Plan Note (Signed)
Discussed with him today.  Discussed the need to stop smoking.  Follow.

## 2016-09-12 NOTE — Assessment & Plan Note (Signed)
Controlled on nexium.  

## 2016-09-12 NOTE — Assessment & Plan Note (Signed)
Tramadol

## 2016-09-12 NOTE — Assessment & Plan Note (Signed)
Follow vitamin D level.  

## 2016-10-22 ENCOUNTER — Ambulatory Visit (INDEPENDENT_AMBULATORY_CARE_PROVIDER_SITE_OTHER): Payer: 59

## 2016-10-22 ENCOUNTER — Ambulatory Visit (INDEPENDENT_AMBULATORY_CARE_PROVIDER_SITE_OTHER): Payer: 59 | Admitting: Internal Medicine

## 2016-10-22 ENCOUNTER — Encounter: Payer: Self-pay | Admitting: Internal Medicine

## 2016-10-22 VITALS — BP 150/88 | HR 68 | Temp 97.6°F | Ht 65.0 in | Wt 148.6 lb

## 2016-10-22 DIAGNOSIS — M79602 Pain in left arm: Secondary | ICD-10-CM

## 2016-10-22 DIAGNOSIS — R739 Hyperglycemia, unspecified: Secondary | ICD-10-CM | POA: Diagnosis not present

## 2016-10-22 DIAGNOSIS — E7800 Pure hypercholesterolemia, unspecified: Secondary | ICD-10-CM

## 2016-10-22 DIAGNOSIS — G8929 Other chronic pain: Secondary | ICD-10-CM

## 2016-10-22 DIAGNOSIS — M25512 Pain in left shoulder: Secondary | ICD-10-CM | POA: Diagnosis not present

## 2016-10-22 DIAGNOSIS — R030 Elevated blood-pressure reading, without diagnosis of hypertension: Secondary | ICD-10-CM

## 2016-10-22 DIAGNOSIS — K219 Gastro-esophageal reflux disease without esophagitis: Secondary | ICD-10-CM

## 2016-10-22 LAB — BASIC METABOLIC PANEL
BUN: 17 mg/dL (ref 6–23)
CHLORIDE: 102 meq/L (ref 96–112)
CO2: 30 mEq/L (ref 19–32)
CREATININE: 0.95 mg/dL (ref 0.40–1.50)
Calcium: 9.6 mg/dL (ref 8.4–10.5)
GFR: 87.35 mL/min (ref >60.00)
Glucose, Bld: 94 mg/dL (ref 70–99)
Potassium: 4.3 mEq/L (ref 3.5–5.1)
Sodium: 138 mEq/L (ref 135–145)

## 2016-10-22 LAB — HEPATIC FUNCTION PANEL
ALT: 34 U/L (ref 0–53)
AST: 26 U/L (ref 0–37)
Albumin: 4.6 g/dL (ref 3.5–5.2)
Alkaline Phosphatase: 56 U/L (ref 39–117)
BILIRUBIN DIRECT: 0.1 mg/dL (ref 0.0–0.3)
BILIRUBIN TOTAL: 0.5 mg/dL (ref 0.2–1.2)
Total Protein: 7.2 g/dL (ref 6.0–8.3)

## 2016-10-22 LAB — LIPID PANEL
CHOLESTEROL: 206 mg/dL — AB (ref 0–200)
HDL: 51.8 mg/dL (ref >39.00)
LDL CALC: 135 mg/dL — AB (ref 0–99)
NONHDL: 153.9
Total CHOL/HDL Ratio: 4
Triglycerides: 95 mg/dL (ref 0.0–149.0)
VLDL: 19 mg/dL (ref 0.0–40.0)

## 2016-10-22 LAB — HEMOGLOBIN A1C: Hgb A1c MFr Bld: 6.1 % (ref 4.6–6.5)

## 2016-10-22 NOTE — Progress Notes (Signed)
[name], male   DOB: 12-25-1961, 55 y.o.   MRN: [id number]   Subjective:    [name], male    DOB: September 27, 1961, 55 y.o.   MRN: [id number]  HPI  Patient here for a scheduled follow up.  States he is doing relatively well.  Still with left upper arm pain.  Increased pain with abduction of his arm - especially above 90 degrees.  Uses his arms a lot.  No chest pain.  No sob. Stays active.  No abdominal pain or cramping.  Bowels stable.  Discussed diet and exercise.     Past Medical History:  Diagnosis Date  . Chronic leg pain    s/p injury right leg (age 46)  . GERD (gastroesophageal reflux disease)   . History of ankle fracture    persistent pain  . Hypercholesterolemia   . Nephrolithiasis    followed by Edrick Oh  . Vitamin D deficiency    Past Surgical History:  Procedure Laterality Date  . ABDOMINAL SURGERY     after accident.  repaired spleen  . LEG SURGERY     Family History  Problem Relation Age of Onset  . Colon cancer Father   . Heart disease Father     has a pacemaker and defibrillator  . Hypertension Father   . Diabetes Father    Social History   Social History  . Marital status: Married    Spouse name: N/A  . Number of children: N/A  . Years of education: N/A   Social History Main Topics  . Smoking status: Current Every Day Smoker  . Smokeless tobacco: Former Systems developer  . Alcohol use No  . Drug use: No  . Sexual activity: Not Asked   Other Topics Concern  . None   Social History Narrative  . None    Outpatient Encounter Prescriptions as of 10/22/2016  Medication Sig  . Cholecalciferol 1000 UNITS capsule Take 1 capsule (1,000 Units total) by mouth daily.  Marland Kitchen esomeprazole (NEXIUM) 40 MG capsule Take 20 mg by mouth daily.  Marland Kitchen lovastatin (MEVACOR) 40 MG tablet TAKE ONE (1) TABLET AT BEDTIME  . traMADol (ULTRAM) 50 MG tablet TAKE ONE (1) TABLET EACH DAY AS NEEDED   No facility-administered encounter medications on file  as of 10/22/2016.     Review of Systems  Constitutional: Negative for appetite change and unexpected weight change.  HENT: Negative for congestion and sinus pressure.   Respiratory: Negative for cough, chest tightness and shortness of breath.   Cardiovascular: Negative for chest pain, palpitations and leg swelling.  Gastrointestinal: Negative for abdominal pain, diarrhea, nausea and vomiting.  Genitourinary: Negative for difficulty urinating and dysuria.  Musculoskeletal: Negative for joint swelling.       Left upper arm pain.    Skin: Negative for color change and rash.  Neurological: Negative for dizziness, light-headedness and headaches.  Psychiatric/Behavioral: Negative for agitation and dysphoric mood.       Objective:    Physical Exam  Constitutional: He appears well-developed and well-nourished. No distress.  HENT:  Nose: Nose normal.  Mouth/Throat: Oropharynx is clear and moist.  Neck: Neck supple. No thyromegaly present.  Cardiovascular: Normal rate and regular rhythm.   Pulmonary/Chest: Effort normal and breath sounds normal. No respiratory distress.  Abdominal: Soft. Bowel sounds are normal. There is no tenderness.  Musculoskeletal: He exhibits no edema or tenderness.  Increased pain left shoulder with abduction above 90 degrees.  No pain with passive  rom.  No swelling.    Lymphadenopathy:    He has no cervical adenopathy.  Skin: No rash noted. No erythema.  Psychiatric: He has a normal mood and affect. His behavior is normal.    BP (!) 150/88   Pulse 68   Temp 97.6 F (36.4 C) (Oral)   Ht 5' 5" (1.651 m)   Wt 148 lb 9.6 oz (67.4 kg)   SpO2 96%   BMI 24.73 kg/m  Wt Readings from Last 3 Encounters:  10/22/16 148 lb 9.6 oz (67.4 kg)  09/10/16 149 lb 12.8 oz (67.9 kg)  05/07/16 145 lb 4 oz (65.9 kg)     Lab Results  Component Value Date   WBC 8.1 05/07/2016   HGB 15.5 05/07/2016   HCT 46.8 05/07/2016   PLT 259.0 05/07/2016   GLUCOSE 94 10/22/2016    CHOL 206 (H) 10/22/2016   TRIG 95.0 10/22/2016   HDL 51.80 10/22/2016   LDLDIRECT 210.3 11/30/2013   LDLCALC 135 (H) 10/22/2016   ALT 34 10/22/2016   AST 26 10/22/2016   NA 138 10/22/2016   K 4.3 10/22/2016   CL 102 10/22/2016   CREATININE 0.95 10/22/2016   BUN 17 10/22/2016   CO2 30 10/22/2016   TSH 1.19 05/07/2016   PSA 2.23 12/19/2015   HGBA1C 6.1 10/22/2016       Assessment & Plan:   Problem List Items Addressed This Visit    Elevated blood pressure reading    On recheck, still slightly elevated.  Have him spot check his pressure.  Get back in soon to reassess.  Follow pressures.  Follow metabolic panel.        GERD (gastroesophageal reflux disease)    Controlled on nexium.        Hypercholesterolemia    Did not tolerate other statin medications.  Does tolerate lovastatin at this dose.  Low cholesterol diet and exercise.  Follow lipid panel and liver function tests.        Relevant Orders   Lipid panel (Completed)   Hepatic function panel (Completed)   Hyperglycemia    Low carb diet and exercise.  Follow met b and a1c.        Relevant Orders   Hemoglobin A1c (Completed)   Basic metabolic panel (Completed)   Left arm pain    Symptoms and exam as outlined.  We discussed rotator cuff injury.  Will xray.  Further w/up pending results.  Has tramadol.  Tylenol.  Follow.         Other Visit Diagnoses    Chronic left shoulder pain    -  Primary   Relevant Orders   DG Shoulder Left (Completed)       Einar Pheasant, MD

## 2016-10-22 NOTE — Progress Notes (Signed)
Pre visit review using our clinic review tool, if applicable. No additional management support is needed unless otherwise documented below in the visit note. 

## 2016-10-24 ENCOUNTER — Encounter: Payer: Self-pay | Admitting: Internal Medicine

## 2016-10-24 DIAGNOSIS — I10 Essential (primary) hypertension: Secondary | ICD-10-CM | POA: Insufficient documentation

## 2016-10-24 NOTE — Assessment & Plan Note (Signed)
Controlled on nexium.  

## 2016-10-24 NOTE — Assessment & Plan Note (Signed)
Symptoms and exam as outlined.  We discussed rotator cuff injury.  Will xray.  Further w/up pending results.  Has tramadol.  Tylenol.  Follow.

## 2016-10-24 NOTE — Assessment & Plan Note (Signed)
Did not tolerate other statin medications.  Does tolerate lovastatin at this dose.  Low cholesterol diet and exercise.  Follow lipid panel and liver function tests.

## 2016-10-24 NOTE — Assessment & Plan Note (Signed)
On recheck, still slightly elevated.  Have him spot check his pressure.  Get back in soon to reassess.  Follow pressures.  Follow metabolic panel.

## 2016-10-24 NOTE — Assessment & Plan Note (Signed)
Low carb diet and exercise.  Follow met b and a1c.   

## 2016-11-09 ENCOUNTER — Other Ambulatory Visit: Payer: Self-pay

## 2016-11-09 NOTE — Telephone Encounter (Signed)
Last filled 09/10/16 30 1rf

## 2016-11-10 MED ORDER — TRAMADOL HCL 50 MG PO TABS: ORAL_TABLET | ORAL | 1 refills | Status: DC

## 2016-11-10 NOTE — Telephone Encounter (Signed)
faxed

## 2016-11-29 ENCOUNTER — Telehealth: Payer: Self-pay

## 2016-11-29 NOTE — Telephone Encounter (Signed)
Patients Drug Screen came back not detecting Tramadol. Patient states he takes tramadol twice a day for pain as needed. Patient states he uses one prescription every month and a half.

## 2017-01-04 ENCOUNTER — Encounter: Payer: Self-pay | Admitting: Internal Medicine

## 2017-01-24 ENCOUNTER — Other Ambulatory Visit: Payer: Self-pay | Admitting: Internal Medicine

## 2017-01-24 DIAGNOSIS — G8929 Other chronic pain: Secondary | ICD-10-CM

## 2017-01-24 DIAGNOSIS — M79604 Pain in right leg: Principal | ICD-10-CM

## 2017-01-24 NOTE — Telephone Encounter (Signed)
Last filled 11/10/16 30 1rf

## 2017-01-25 MED ORDER — TRAMADOL HCL 50 MG PO TABS: 50.0000 mg | ORAL_TABLET | Freq: Every day | ORAL | 0 refills | Status: DC | PRN

## 2017-01-25 NOTE — Telephone Encounter (Signed)
Please fax

## 2017-01-25 NOTE — Telephone Encounter (Signed)
Script sent to pharmacy.

## 2017-01-25 NOTE — Addendum Note (Signed)
Addended by: Donnamarie PoagHOMPSON, Quin Mcpherson Y on: 01/25/2017 03:02 PM   Modules accepted: Orders

## 2017-01-31 ENCOUNTER — Other Ambulatory Visit: Payer: Self-pay | Admitting: Internal Medicine

## 2017-02-04 ENCOUNTER — Ambulatory Visit (INDEPENDENT_AMBULATORY_CARE_PROVIDER_SITE_OTHER): Payer: 59 | Admitting: Family Medicine

## 2017-02-04 ENCOUNTER — Encounter: Payer: Self-pay | Admitting: Family Medicine

## 2017-02-04 VITALS — BP 152/88 | HR 100 | Temp 99.0°F | Resp 18 | Wt 146.8 lb

## 2017-02-04 DIAGNOSIS — J069 Acute upper respiratory infection, unspecified: Secondary | ICD-10-CM

## 2017-02-04 DIAGNOSIS — B9789 Other viral agents as the cause of diseases classified elsewhere: Secondary | ICD-10-CM

## 2017-02-04 DIAGNOSIS — R05 Cough: Secondary | ICD-10-CM

## 2017-02-04 DIAGNOSIS — R059 Cough, unspecified: Secondary | ICD-10-CM

## 2017-02-04 MED ORDER — HYDROCODONE-HOMATROPINE 5-1.5 MG/5ML PO SYRP: 5.0000 mL | ORAL_SOLUTION | Freq: Three times a day (TID) | ORAL | 0 refills | Status: DC | PRN

## 2017-02-04 MED ORDER — AZITHROMYCIN 250 MG PO TABS: ORAL_TABLET | ORAL | 0 refills | Status: DC

## 2017-02-04 NOTE — Progress Notes (Signed)
Pre visit review using our clinic review tool, if applicable. No additional management support is needed unless otherwise documented below in the visit note. 

## 2017-02-04 NOTE — Progress Notes (Signed)
Subjective:    [name], male    DOB: 01-31-1961, 56 y.o.   MRN: [id number]  HPI This is a 56 yo male who presents today with cough and headache x 3 days. 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 No SOB, some wheezing. Occasional sputum, unsure of color. Headache at back of head. No ear pain or sore throat. No fever or fatigue. Some aches that he attributes to deep cough. Has taken some BC powders and Robitussin without significant relief. Tried some of his wife's prescription cough syrup (promethezine BC) which helped for a couple of hours. He smokes 1ppd. Wife also smokes. Quit for 12 years and restarted several years ago. Not interested in quitting at this time. No known asthma, has never required inhaler.   Past Medical History:  Diagnosis Date  . Chronic leg pain    s/p injury right leg (age 56)  . GERD (gastroesophageal reflux disease)   . History of ankle fracture    persistent pain  . Hypercholesterolemia   . Nephrolithiasis    followed by Assunta GamblesBrian Cope  . Vitamin D deficiency    Past Surgical History:  Procedure Laterality Date  . ABDOMINAL SURGERY     after accident.  repaired spleen  . LEG SURGERY     Family History  Problem Relation Age of Onset  . Colon cancer Father   . Heart disease Father     has a pacemaker and defibrillator  . Hypertension Father   . Diabetes Father    Social History  Substance Use Topics  . Smoking status: Current Every Day Smoker  . Smokeless tobacco: Former NeurosurgeonUser  . Alcohol use No      Review of Systems Per HPI    Objective:   Physical Exam  Constitutional: He is oriented to person, place, and time. He appears well-developed and well-nourished. No distress.  HENT:  Head: Normocephalic and atraumatic.  Right Ear: Tympanic membrane, external ear and ear canal normal.  Left Ear: Tympanic membrane, external ear and ear canal normal.  Nose: Nose normal.  Mouth/Throat:  Oropharynx is clear and moist.  Eyes: Conjunctivae are normal.  Neck: Normal range of motion. Neck supple.  Cardiovascular: Normal rate, regular rhythm and normal heart sounds.   Pulmonary/Chest: Effort normal and breath sounds normal. No respiratory distress. He has no wheezes. He has no rales.  Lymphadenopathy:    He has no cervical adenopathy.  Neurological: He is alert and oriented to person, place, and time.  Skin: Skin is warm and dry. He is not diaphoretic.  Psychiatric: He has a normal mood and affect. His behavior is normal. Judgment and thought content normal.  Vitals reviewed.     BP (!) 152/88 (BP Location: Left Arm, Patient Position: Sitting, Cuff Size: Normal)   Pulse 100   Temp 99 F (37.2 C) (Oral)   Resp 18   Wt 146 lb 12.8 oz (66.6 kg)   SpO2 96%   BMI 24.43 kg/m  Wt Readings from Last 3 Encounters:  02/04/17 146 lb 12.8 oz (66.6 kg)  10/22/16 148 lb 9.6 oz (67.4 kg)  09/10/16 149 lb 12.8 oz (67.9 kg)       Assessment & Plan:  1. Viral upper respiratory tract infection - Provided written and verbal information regarding diagnosis and treatment. - suspect viral illness, provided wait and see antibiotic prescription if not improved in 3-4 days, can start - add  OTC mucinex, ibuprofen prn - RTC/ER precautions reviewed - HYDROcodone-homatropine (HYCODAN) 5-1.5 MG/5ML syrup; Take 5 mLs by mouth every 8 (eight) hours as needed for cough.  Dispense: 120 mL; Refill: 0 - azithromycin (ZITHROMAX) 250 MG tablet; Take 2 tabs PO x 1 dose, then 1 tab PO QD x 4 days  Dispense: 6 tablet; Refill: 0  2. Cough - HYDROcodone-homatropine (HYCODAN) 5-1.5 MG/5ML syrup; Take 5 mLs by mouth every 8 (eight) hours as needed for cough.  Dispense: 120 mL; Refill: 0 - azithromycin (ZITHROMAX) 250 MG tablet; Take 2 tabs PO x 1 dose, then 1 tab PO QD x 4 days  Dispense: 6 tablet; Refill: 0  3. Tobacco abuse - discussed smoking cessation, patient not interested at this time  Olean Ree, FNP-BC  [facility] [place], [facility]  02/04/2017 11:57 AM

## 2017-02-04 NOTE — Patient Instructions (Signed)
I think you have a viral upper respiratory infection If you are not better in 3-4 days, please start antibiotic Please take plain Mucinex (generic is fine) If you develop shortness of breath, call 911 If you are not better in 7-10 days please let us know  Upper Respiratory Infection, Adult Most upper respiratory infections (URIs) are a viral infection of the air passages leading to the lungs. A URI affects the nose, throat, and upper air passages. The most common type of URI is nasopharyngitis and is typically referred to as "the common cold." URIs run their course and usually go away on their own. Most of the time, a URI does not require medical attention, but sometimes a bacterial infection in the upper airways can follow a viral infection. This is called a secondary infection. Sinus and middle ear infections are common types of secondary upper respiratory infections. Bacterial pneumonia can also complicate a URI. A URI can worsen asthma and chronic obstructive pulmonary disease (COPD). Sometimes, these complications can require emergency medical care and may be life threatening. What are the causes? Almost all URIs are caused by viruses. A virus is a type of germ and can spread from one person to another. What increases the risk? You may be at risk for a URI if:  You smoke.  You have chronic heart or lung disease.  You have a weakened defense (immune) system.  You are very young or very old.  You have nasal allergies or asthma.  You work in crowded or poorly ventilated areas.  You work in health care facilities or schools. What are the signs or symptoms? Symptoms typically develop 2-3 days after you come in contact with a cold virus. Most viral URIs last 7-10 days. However, viral URIs from the influenza virus (flu virus) can last 14-18 days and are typically more severe. Symptoms may include:  Runny or stuffy (congested) nose.  Sneezing.  Cough.  Sore  throat.  Headache.  Fatigue.  Fever.  Loss of appetite.  Pain in your forehead, behind your eyes, and over your cheekbones (sinus pain).  Muscle aches. How is this diagnosed? Your health care provider may diagnose a URI by:  Physical exam.  Tests to check that your symptoms are not due to another condition such as:  Strep throat.  Sinusitis.  Pneumonia.  Asthma. How is this treated? A URI goes away on its own with time. It cannot be cured with medicines, but medicines may be prescribed or recommended to relieve symptoms. Medicines may help:  Reduce your fever.  Reduce your cough.  Relieve nasal congestion. Follow these instructions at home:  Take medicines only as directed by your health care provider.  Gargle warm saltwater or take cough drops to comfort your throat as directed by your health care provider.  Use a warm mist humidifier or inhale steam from a shower to increase air moisture. This may make it easier to breathe.  Drink enough fluid to keep your urine clear or pale yellow.  Eat soups and other clear broths and maintain good nutrition.  Rest as needed.  Return to work when your temperature has returned to normal or as your health care provider advises. You may need to stay home longer to avoid infecting others. You can also use a face mask and careful hand washing to prevent spread of the virus.  Increase the usage of your inhaler if you have asthma.  Do not use any tobacco products, including cigarettes, chewing tobacco, or electronic  cigarettes. If you need help quitting, ask your health care provider. How is this prevented? The best way to protect yourself from getting a cold is to practice good hygiene.  Avoid oral or hand contact with people with cold symptoms.  Wash your hands often if contact occurs. There is no clear evidence that vitamin C, vitamin E, echinacea, or exercise reduces the chance of developing a cold. However, it is always  recommended to get plenty of rest, exercise, and practice good nutrition. Contact a health care provider if:  You are getting worse rather than better.  Your symptoms are not controlled by medicine.  You have chills.  You have worsening shortness of breath.  You have brown or red mucus.  You have yellow or brown nasal discharge.  You have pain in your face, especially when you bend forward.  You have a fever.  You have swollen neck glands.  You have pain while swallowing.  You have white areas in the back of your throat. Get help right away if:  You have severe or persistent:  Headache.  Ear pain.  Sinus pain.  Chest pain.  You have chronic lung disease and any of the following:  Wheezing.  Prolonged cough.  Coughing up blood.  A change in your usual mucus.  You have a stiff neck.  You have changes in your:  Vision.  Hearing.  Thinking.  Mood. This information is not intended to replace advice given to you by your health care provider. Make sure you discuss any questions you have with your health care provider. Document Released: 06/08/2001 Document Revised: 08/15/2016 Document Reviewed: 03/20/2014 Elsevier Interactive Patient Education  2017 [employer].

## 2017-02-07 ENCOUNTER — Encounter: Payer: Self-pay | Admitting: Family

## 2017-02-07 ENCOUNTER — Ambulatory Visit (INDEPENDENT_AMBULATORY_CARE_PROVIDER_SITE_OTHER): Payer: 59 | Admitting: Family

## 2017-02-07 ENCOUNTER — Ambulatory Visit (INDEPENDENT_AMBULATORY_CARE_PROVIDER_SITE_OTHER): Payer: 59

## 2017-02-07 VITALS — BP 134/62 | HR 77 | Temp 98.3°F | Resp 16 | Wt 144.6 lb

## 2017-02-07 DIAGNOSIS — J40 Bronchitis, not specified as acute or chronic: Secondary | ICD-10-CM

## 2017-02-07 MED ORDER — BENZONATATE 100 MG PO CAPS: 100.0000 mg | ORAL_CAPSULE | Freq: Two times a day (BID) | ORAL | 0 refills | Status: DC | PRN

## 2017-02-07 MED ORDER — ALBUTEROL SULFATE (2.5 MG/3ML) 0.083% IN NEBU: 2.5000 mg | INHALATION_SOLUTION | Freq: Once | RESPIRATORY_TRACT | Status: DC

## 2017-02-07 MED ORDER — ALBUTEROL SULFATE HFA 108 (90 BASE) MCG/ACT IN AERS
2.0000 | INHALATION_SPRAY | Freq: Four times a day (QID) | RESPIRATORY_TRACT | 1 refills | Status: DC | PRN
Start: 2017-02-07 — End: 2017-09-09

## 2017-02-07 MED ORDER — ALBUTEROL SULFATE (2.5 MG/3ML) 0.083% IN NEBU
2.5000 mg | INHALATION_SOLUTION | Freq: Once | RESPIRATORY_TRACT | Status: AC
  Administered 2017-02-07: 2.5 mg via RESPIRATORY_TRACT

## 2017-02-07 NOTE — Patient Instructions (Signed)
Stop hycodan Trial albuterol for cough Suspect viral illness  Let me know if not better

## 2017-02-07 NOTE — Progress Notes (Signed)
Subjective:    [name], male    DOB: January 02, 1961, 56 y.o.   MRN: [id number]  CC: Thomas Harper is a 56 y.o. male who presents today for an acute visit.    HPI: CC: dry cough X one week , worsening. Cough triggered by change in air temperature.  Endorses wheezing when coughing, sinus congestion, ear pain. 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  Vomited undigested food twice yesterday. None today. Continues to feel nauseated and has been taking zofran with resolve. No appetite. No abdominal pain. Denies exertional chest pain or pressure, numbness or tingling radiating to left arm or jaw, palpitations, dizziness, frequent headaches, changes in vision, or shortness of breath.     No h/o PNA.   Not better on azithromycin ( completed), hycodan. Has also been mucinex.   Smoker   2/9 started on hycodan and azithromycin for URI   HISTORY:  Past Medical History:  Diagnosis Date  . Chronic leg pain    s/p injury right leg (age 24)  . GERD (gastroesophageal reflux disease)   . History of ankle fracture    persistent pain  . Hypercholesterolemia   . Nephrolithiasis    followed by Thomas Harper  . Vitamin D deficiency    Past Surgical History:  Procedure Laterality Date  . ABDOMINAL SURGERY     after accident.  repaired spleen  . LEG SURGERY     Family History  Problem Relation Age of Onset  . Colon cancer Father   . Heart disease Father     has a pacemaker and defibrillator  . Hypertension Father   . Diabetes Father     Allergies: Fish oil and Simvastatin Current Outpatient Prescriptions on File Prior to Visit  Medication Sig Dispense Refill  . azithromycin (ZITHROMAX) 250 MG tablet Take 2 tabs PO x 1 dose, then 1 tab PO QD x 4 days 6 tablet 0  . Cholecalciferol 1000 UNITS capsule Take 1 capsule (1,000 Units total) by mouth daily. 30 capsule 11  . esomeprazole (NEXIUM) 40 MG capsule Take  20 mg by mouth daily.    Thomas Harper HYDROcodone-homatropine (HYCODAN) 5-1.5 MG/5ML syrup Take 5 mLs by mouth every 8 (eight) hours as needed for cough. 120 mL 0  . lovastatin (MEVACOR) 40 MG tablet TAKE ONE TABLET AT BEDTIME 30 tablet 0  . traMADol (ULTRAM) 50 MG tablet Take 1 tablet (50 mg total) by mouth daily as needed. 30 tablet 0   No current facility-administered medications on file prior to visit.     Social History  Substance Use Topics  . Smoking status: Current Every Day Smoker  . Smokeless tobacco: Former Neurosurgeon  . Alcohol use No    Review of Systems  HENT: Negative for congestion, ear pain and sore throat.   Respiratory: Positive for cough, shortness of breath and wheezing.   Gastrointestinal: Positive for nausea and vomiting (resolved). Negative for abdominal distention and abdominal pain.      Objective:    BP 134/62 (BP Location: Left Arm, Patient Position: Sitting, Cuff Size: Large)   Pulse 77   Temp 98.3 F (36.8 C) (Oral)   Resp 16   Wt 144 lb 9.6 oz (65.6 kg)   SpO2 97%   BMI 24.06 kg/m    Physical Exam  Constitutional: Vital signs are normal. He appears well-developed and well-nourished.  HENT:  Head: Normocephalic and atraumatic.  Right Ear: Hearing, tympanic membrane, external ear and ear canal normal. No drainage, swelling or tenderness. Tympanic membrane is not injected, not erythematous and not bulging. No middle ear effusion. No decreased hearing is noted.  Left Ear: Hearing, tympanic membrane, external ear and ear canal normal. No drainage, swelling or tenderness. Tympanic membrane is not injected, not erythematous and not bulging.  No middle ear effusion. No decreased hearing is noted.  Nose: Nose normal. Right sinus exhibits no maxillary sinus tenderness and no frontal sinus tenderness. Left sinus exhibits no maxillary sinus tenderness and no frontal sinus tenderness.  Mouth/Throat: Uvula is midline, oropharynx is clear and moist and mucous membranes are  normal. No oropharyngeal exudate, posterior oropharyngeal edema, posterior oropharyngeal erythema or tonsillar abscesses.  Eyes: Conjunctivae are normal.  Cardiovascular: Regular rhythm and normal heart sounds.   Pulmonary/Chest: Effort normal and breath sounds normal. No respiratory distress. He has no wheezes. He has no rhonchi. He has no rales.  Lymphadenopathy:       Head (right side): No submental, no submandibular, no tonsillar, no preauricular, no posterior auricular and no occipital adenopathy present.       Head (left side): No submental, no submandibular, no tonsillar, no preauricular, no posterior auricular and no occipital adenopathy present.    He has no cervical adenopathy.  Neurological: He is alert.  Skin: Skin is warm and dry.  Psychiatric: He has a normal mood and affect. His speech is normal and behavior is normal.  Vitals reviewed.  Patient felt significantly better after albuterol treatment. Lung sounds clear and increased     Assessment & Plan:   1. Bronchitis Afebrile.sao2 97. Working diagnosis of bronchitis suspect viral or from environmental allergens due to line of work. Patient is also smoker. Discussed trial of albuterol inhaler. Pending chest x-ray to ensure no underlying bacterial infection however would presume he was effectively treated with azithromycin. If Patient does not felt better with trial of albuterol, we'll start short course of prednisone. Advised patient to stop-cough syrup as suspect is making him nauseated. - DG Chest 2 View - albuterol (PROVENTIL) (2.5 MG/3ML) 0.083% nebulizer solution 2.5 mg; Take 3 mLs (2.5 mg total) by nebulization once. - benzonatate (TESSALON) 100 MG capsule; Take 1 capsule (100 mg total) by mouth 2 (two) times daily as needed for cough.  Dispense: 20 capsule; Refill: 0 - albuterol (PROVENTIL HFA) 108 (90 Base) MCG/ACT inhaler; Inhale 2 puffs into the lungs every 6 (six) hours as needed for wheezing or shortness of breath.   Dispense: 1 Inhaler; Refill: 1    I am having Mr. Thomas Harper start on benzonatate and albuterol. I am also having him maintain his esomeprazole, Cholecalciferol, traMADol, lovastatin, HYDROcodone-homatropine, and azithromycin. We administered albuterol.   Meds ordered this encounter  Medications  . DISCONTD: albuterol (PROVENTIL) (2.5 MG/3ML) 0.083% nebulizer solution 2.5 mg  . albuterol (PROVENTIL) (2.5 MG/3ML) 0.083% nebulizer solution 2.5 mg  . benzonatate (TESSALON) 100 MG capsule    Sig: Take 1 capsule (100 mg total) by mouth 2 (two) times daily as needed for cough.    Dispense:  20 capsule    Refill:  0    Order Specific Question:   Supervising Provider    Answer:   Duncan DullULLO, TERESA L [[id number]]  . albuterol (PROVENTIL HFA) 108 (90 Base) MCG/ACT inhaler    Sig: Inhale 2 puffs into the lungs every 6 (six) hours as needed for wheezing or shortness of breath.    Dispense:  1 Inhaler  Refill:  1    Order Specific Question:   Supervising Provider    Answer:   Sherlene Shams [[id number]]    Return precautions given.   Risks, benefits, and alternatives of the medications and treatment plan prescribed today were discussed, and patient expressed understanding.   Education regarding symptom management and diagnosis given to patient on AVS.  Continue to follow with Dale Cataract, MD for routine health maintenance.   [name] and I agreed with plan.   Rennie Plowman, FNP

## 2017-02-07 NOTE — Progress Notes (Signed)
Pre-visit discussion using our clinic review tool. No additional management support is needed unless otherwise documented below in the visit note.  

## 2017-03-10 ENCOUNTER — Other Ambulatory Visit: Payer: Self-pay | Admitting: Internal Medicine

## 2017-03-10 DIAGNOSIS — M79604 Pain in right leg: Principal | ICD-10-CM

## 2017-03-10 DIAGNOSIS — G8929 Other chronic pain: Secondary | ICD-10-CM

## 2017-03-10 NOTE — Telephone Encounter (Signed)
Medication: tramadol 50mg  Directions: 1 po qd prn  Last given: 01/25/17  Number refills: 0 Last o/v: 10/22/16 Follow up: n/a   Medication: Lovastatin 40mg  Directions: 1 po qhs  Last given: 01/31/17 Number refills: 0 Last o/v: 10/22/16 Follow up: 0  Last o/v notes states that pt was to f/u in 6 weeks he has not been seen in office can we refill and do I need to call for f/u

## 2017-03-11 NOTE — Telephone Encounter (Signed)
Faxed to pharmacy

## 2017-03-11 NOTE — Telephone Encounter (Signed)
ok'd rx for tramadol #30 with no refills and lovastatin #30 with one refill.

## 2017-05-24 ENCOUNTER — Other Ambulatory Visit: Payer: Self-pay

## 2017-05-24 DIAGNOSIS — J40 Bronchitis, not specified as acute or chronic: Secondary | ICD-10-CM

## 2017-05-24 NOTE — Telephone Encounter (Signed)
Please advise. Ok to refill for cough?

## 2017-05-25 MED ORDER — BENZONATATE 100 MG PO CAPS: 100.0000 mg | ORAL_CAPSULE | Freq: Two times a day (BID) | ORAL | 0 refills | Status: DC | PRN

## 2017-05-25 NOTE — Telephone Encounter (Signed)
May refill once but please call pt and advise that he needs to see PCP. [place]. If cough persistent, he needs OV.

## 2017-05-30 ENCOUNTER — Encounter: Payer: Self-pay | Admitting: Family Medicine

## 2017-05-30 ENCOUNTER — Ambulatory Visit (INDEPENDENT_AMBULATORY_CARE_PROVIDER_SITE_OTHER): Payer: 59 | Admitting: Family Medicine

## 2017-05-30 DIAGNOSIS — L255 Unspecified contact dermatitis due to plants, except food: Secondary | ICD-10-CM | POA: Diagnosis not present

## 2017-05-30 MED ORDER — PREDNISONE 10 MG PO TABS: ORAL_TABLET | ORAL | 0 refills | Status: DC

## 2017-05-30 NOTE — Progress Notes (Signed)
   Subjective:  [name], male    DOB: Dec 16, 1961  Age: 56 y.o. MRN: [id number]  CC: Poison oak  HPI:  56 year old male presents with the above complaint.  Patient reports that he was removing some poison oak from the side of his house 2 days ago. He tried to be careful but has developed rash. Rash is located on his arms as well as back and trunk. He also has small area of rash and swelling around his left eye. Rash is severe and quite pruritic. No known exacerbating or relieving factors. No other associated symptoms. No other complaints or concerns at this time.  Social Hx   Social History   Social History  . Marital status: Married    Spouse name: N/A  . Number of children: N/A  . Years of education: N/A   Social History Main Topics  . Smoking status: Current Every Day Smoker  . Smokeless tobacco: Former NeurosurgeonUser  . Alcohol use No  . Drug use: No  . Sexual activity: Not Asked   Other Topics Concern  . None   Social History Narrative  . None    Review of Systems  Constitutional: Negative.   Skin: Positive for rash.   Objective:  BP 124/86 (BP Location: Left Arm, Patient Position: Sitting, Cuff Size: Normal)   Pulse 85   Temp 97.9 F (36.6 C) (Oral)   Resp 16   Wt 150 lb 6 oz (68.2 kg)   SpO2 94%   BMI 25.02 kg/m   BP/Weight 05/30/2017 02/07/2017 02/04/2017  Systolic BP 124 134 152  Diastolic BP 86 62 88  Wt. (Lbs) 150.38 144.6 146.8  BMI 25.02 24.06 24.43   Physical Exam  Constitutional: He is oriented to person, place, and time. He appears well-developed. No distress.  Pulmonary/Chest: Effort normal.  Neurological: He is alert and oriented to person, place, and time.  Skin:  Erythematous, raised rash noted on the trunk, back, arms. Left periorbital swelling.  Psychiatric: He has a normal mood and affect.  Vitals reviewed.  Lab Results  Component Value Date   WBC 8.1 05/07/2016   HGB 15.5 05/07/2016   HCT 46.8 05/07/2016   PLT 259.0  05/07/2016   GLUCOSE 94 10/22/2016   CHOL 206 (H) 10/22/2016   TRIG 95.0 10/22/2016   HDL 51.80 10/22/2016   LDLDIRECT 210.3 11/30/2013   LDLCALC 135 (H) 10/22/2016   ALT 34 10/22/2016   AST 26 10/22/2016   NA 138 10/22/2016   K 4.3 10/22/2016   CL 102 10/22/2016   CREATININE 0.95 10/22/2016   BUN 17 10/22/2016   CO2 30 10/22/2016   TSH 1.19 05/07/2016   PSA 2.23 12/19/2015   HGBA1C 6.1 10/22/2016    Assessment & Plan:   Problem List Items Addressed This Visit      Musculoskeletal and Integument   Dermatitis due to plants, including poison ivy, sumac, and oak    New problem. Treating with Prednisone taper.         Meds ordered this encounter  Medications  . predniSONE (DELTASONE) 10 MG tablet    Sig: 50 mg daily x 3 days, then 40 mg daily x 3 days, then 30 mg daily x 3 days, then 20 mg daily x 3 days, then 10 mg daily x 3 days.    Dispense:  45 tablet    Refill:  0   Follow-up: PRN  Everlene OtherJayce Juhi Lagrange DO [facility] [place]

## 2017-05-30 NOTE — Assessment & Plan Note (Signed)
New problem. Treating with Prednisone taper. 

## 2017-05-30 NOTE — Patient Instructions (Signed)
Prednisone as prescribed. ° °Take care ° °Dr. Kenora Spayd  °

## 2017-05-31 NOTE — Telephone Encounter (Signed)
Did we call pt with note below?   Can I close this encounter?

## 2017-06-01 NOTE — Telephone Encounter (Signed)
The last statement was not correct.  He has pick up medication from pharmacy.  Unable to reach patient.

## 2017-06-01 NOTE — Telephone Encounter (Signed)
I am not completely sure due to me being out of office that whole week.

## 2017-06-10 ENCOUNTER — Other Ambulatory Visit: Payer: Self-pay | Admitting: Internal Medicine

## 2017-06-10 DIAGNOSIS — G8929 Other chronic pain: Secondary | ICD-10-CM

## 2017-06-10 DIAGNOSIS — M79604 Pain in right leg: Principal | ICD-10-CM

## 2017-06-10 NOTE — Telephone Encounter (Signed)
Will refill x 1, but needs a f/u appt scheduled.

## 2017-06-10 NOTE — Telephone Encounter (Signed)
Tramadol 50 mg Last OV 10/22/2016 Last refilled 03/11/2017 Next OV  None

## 2017-06-13 ENCOUNTER — Other Ambulatory Visit: Payer: Self-pay | Admitting: Internal Medicine

## 2017-06-13 DIAGNOSIS — G8929 Other chronic pain: Secondary | ICD-10-CM

## 2017-06-13 DIAGNOSIS — M79604 Pain in right leg: Principal | ICD-10-CM

## 2017-08-15 ENCOUNTER — Other Ambulatory Visit: Payer: Self-pay | Admitting: Internal Medicine

## 2017-08-15 DIAGNOSIS — M79604 Pain in right leg: Principal | ICD-10-CM

## 2017-08-15 DIAGNOSIS — G8929 Other chronic pain: Secondary | ICD-10-CM

## 2017-08-15 NOTE — Telephone Encounter (Signed)
Last OV 10/22/2016 Next OV not scheduled Last refill request refused on 06/13/2017

## 2017-08-16 NOTE — Telephone Encounter (Signed)
Needs a f/u appt before any more refills.

## 2017-09-06 ENCOUNTER — Telehealth: Payer: Self-pay | Admitting: Internal Medicine

## 2017-09-06 NOTE — Telephone Encounter (Signed)
Pt wife called about pt needing a refill on his traMADol (ULTRAM) 50 MG tablet. No appt avail to sch pt to come in first before refill. Please advise?  Call pt @ [phone]. Thank you!

## 2017-09-06 NOTE — Telephone Encounter (Signed)
Pt needs refill on Tramadol Last O/V 10/22/16 F/u None Last script 06/10/17 #30 0 refills Does he need a follow up?

## 2017-09-07 NOTE — Telephone Encounter (Signed)
Yes.  He needs f/u before refill.  We have opening tomorrow.  If unable to come tomorrow, will need to schedule.

## 2017-09-07 NOTE — Telephone Encounter (Signed)
Called patient app given for this Friday. Did not have any questions. Informed that script will not be given until seen in office.

## 2017-09-09 ENCOUNTER — Ambulatory Visit (INDEPENDENT_AMBULATORY_CARE_PROVIDER_SITE_OTHER): Payer: 59 | Admitting: Internal Medicine

## 2017-09-09 ENCOUNTER — Ambulatory Visit (INDEPENDENT_AMBULATORY_CARE_PROVIDER_SITE_OTHER): Payer: 59

## 2017-09-09 ENCOUNTER — Encounter: Payer: Self-pay | Admitting: Internal Medicine

## 2017-09-09 VITALS — BP 150/80 | HR 65 | Temp 98.7°F | Resp 14 | Wt 158.6 lb

## 2017-09-09 DIAGNOSIS — M79642 Pain in left hand: Secondary | ICD-10-CM

## 2017-09-09 DIAGNOSIS — M79671 Pain in right foot: Secondary | ICD-10-CM | POA: Insufficient documentation

## 2017-09-09 DIAGNOSIS — K219 Gastro-esophageal reflux disease without esophagitis: Secondary | ICD-10-CM | POA: Diagnosis not present

## 2017-09-09 DIAGNOSIS — E559 Vitamin D deficiency, unspecified: Secondary | ICD-10-CM | POA: Diagnosis not present

## 2017-09-09 DIAGNOSIS — Z720 Tobacco use: Secondary | ICD-10-CM | POA: Diagnosis not present

## 2017-09-09 DIAGNOSIS — R739 Hyperglycemia, unspecified: Secondary | ICD-10-CM

## 2017-09-09 DIAGNOSIS — E7800 Pure hypercholesterolemia, unspecified: Secondary | ICD-10-CM

## 2017-09-09 DIAGNOSIS — R030 Elevated blood-pressure reading, without diagnosis of hypertension: Secondary | ICD-10-CM

## 2017-09-09 DIAGNOSIS — M79604 Pain in right leg: Secondary | ICD-10-CM

## 2017-09-09 DIAGNOSIS — G8929 Other chronic pain: Secondary | ICD-10-CM

## 2017-09-09 LAB — CBC WITH DIFFERENTIAL/PLATELET
Basophils Absolute: 0.1 10*3/uL (ref 0.0–0.1)
Basophils Relative: 0.9 % (ref 0.0–3.0)
EOS PCT: 1.4 % (ref 0.0–5.0)
Eosinophils Absolute: 0.1 10*3/uL (ref 0.0–0.7)
HCT: 47.1 % (ref 39.0–52.0)
HEMOGLOBIN: 15.6 g/dL (ref 13.0–17.0)
LYMPHS PCT: 24.7 % (ref 12.0–46.0)
Lymphs Abs: 1.7 10*3/uL (ref 0.7–4.0)
MCHC: 33.1 g/dL (ref 30.0–36.0)
MCV: 92.6 fl (ref 78.0–100.0)
MONO ABS: 0.5 10*3/uL (ref 0.1–1.0)
Monocytes Relative: 6.8 % (ref 3.0–12.0)
Neutro Abs: 4.7 10*3/uL (ref 1.4–7.7)
Neutrophils Relative %: 66.2 % (ref 43.0–77.0)
Platelets: 270 10*3/uL (ref 150.0–400.0)
RBC: 5.09 Mil/uL (ref 4.22–5.81)
RDW: 13.8 % (ref 11.5–15.5)
WBC: 7.1 10*3/uL (ref 4.0–10.5)

## 2017-09-09 LAB — LIPID PANEL
CHOLESTEROL: 251 mg/dL — AB (ref 0–200)
HDL: 57.6 mg/dL (ref >39.00)
LDL Cholesterol: 169 mg/dL — ABNORMAL HIGH (ref 0–99)
NONHDL: 193.23
Total CHOL/HDL Ratio: 4
Triglycerides: 121 mg/dL (ref 0.0–149.0)
VLDL: 24.2 mg/dL (ref 0.0–40.0)

## 2017-09-09 LAB — HEMOGLOBIN A1C: HEMOGLOBIN A1C: 6 % (ref 4.6–6.5)

## 2017-09-09 LAB — BASIC METABOLIC PANEL
BUN: 19 mg/dL (ref 6–23)
CALCIUM: 9.5 mg/dL (ref 8.4–10.5)
CO2: 28 meq/L (ref 19–32)
CREATININE: 0.96 mg/dL (ref 0.40–1.50)
Chloride: 103 mEq/L (ref 96–112)
GFR: 86.02 mL/min (ref >60.00)
Glucose, Bld: 88 mg/dL (ref 70–99)
Potassium: 4.4 mEq/L (ref 3.5–5.1)
Sodium: 137 mEq/L (ref 135–145)

## 2017-09-09 LAB — VITAMIN D 25 HYDROXY (VIT D DEFICIENCY, FRACTURES): VITD: 29.59 ng/mL — AB (ref 30.00–100.00)

## 2017-09-09 LAB — TSH: TSH: 2.63 u[IU]/mL (ref 0.35–4.50)

## 2017-09-09 LAB — HEPATIC FUNCTION PANEL
ALT: 23 U/L (ref 0–53)
AST: 19 U/L (ref 0–37)
Albumin: 4.3 g/dL (ref 3.5–5.2)
Alkaline Phosphatase: 63 U/L (ref 39–117)
BILIRUBIN DIRECT: 0.1 mg/dL (ref 0.0–0.3)
TOTAL PROTEIN: 7 g/dL (ref 6.0–8.3)
Total Bilirubin: 0.4 mg/dL (ref 0.2–1.2)

## 2017-09-09 MED ORDER — TRAMADOL HCL 50 MG PO TABS: 50.0000 mg | ORAL_TABLET | Freq: Every day | ORAL | 0 refills | Status: DC | PRN

## 2017-09-09 NOTE — Progress Notes (Signed)
Patient ID: [name], male   DOB: 28-Apr-1961, 56 y.o.   MRN: [id number]   Subjective:    [name], male    DOB: 1961-05-03, 56 y.o.   MRN: [id number]  HPI  Patient here for a scheduled follow up.  He called in for a refill of his tramadol and was asked to come in for evaluation since it has been a while since I had seen him.  He has several concerns.  He reports persistent pain in his left hand.  This has been present for 5 months.  States that he notices after he drives, his hands are stiff and it hurts to open his hands.  Increased pain across his hand.  Also will have some left elbow pain at times.  Left upper arm pain as well.  No chest pain.  No sob.  No acid reflux.  No abdominal pain.  Bowels moving.  Also having right foot pain.  Increased pain at the base of right great toe.  No known injury.  Has been persistent pain over the last 10 months.     Past Medical History:  Diagnosis Date  . Chronic leg pain    s/p injury right leg (age 66)  . GERD (gastroesophageal reflux disease)   . History of ankle fracture    persistent pain  . Hypercholesterolemia   . Nephrolithiasis    followed by Edrick Oh  . Vitamin D deficiency    Past Surgical History:  Procedure Laterality Date  . ABDOMINAL SURGERY     after accident.  repaired spleen  . LEG SURGERY     Family History  Problem Relation Age of Onset  . Colon cancer Father   . Heart disease Father        has a pacemaker and defibrillator  . Hypertension Father   . Diabetes Father    Social History   Social History  . Marital status: Married    Spouse name: N/A  . Number of children: N/A  . Years of education: N/A   Social History Main Topics  . Smoking status: Current Every Day Smoker  . Smokeless tobacco: Former Systems developer  . Alcohol use No  . Drug use: No  . Sexual activity: Not Asked   Other Topics Concern  . None   Social History Narrative  . None    Outpatient Encounter Prescriptions as  of 09/09/2017  Medication Sig  . esomeprazole (NEXIUM) 40 MG capsule Take 20 mg by mouth daily.  Marland Kitchen lovastatin (MEVACOR) 40 MG tablet TAKE ONE TABLET BY MOUTH AT BEDTIME  . traMADol (ULTRAM) 50 MG tablet Take 1 tablet (50 mg total) by mouth daily as needed.  . [DISCONTINUED] traMADol (ULTRAM) 50 MG tablet TAKE ONE TABLET BY MOUTH DAILY AS NEEDED  . [DISCONTINUED] albuterol (PROVENTIL HFA) 108 (90 Base) MCG/ACT inhaler Inhale 2 puffs into the lungs every 6 (six) hours as needed for wheezing or shortness of breath.  . [DISCONTINUED] Cholecalciferol 1000 UNITS capsule Take 1 capsule (1,000 Units total) by mouth daily.  . [DISCONTINUED] HYDROcodone-homatropine (HYCODAN) 5-1.5 MG/5ML syrup Take 5 mLs by mouth every 8 (eight) hours as needed for cough.  . [DISCONTINUED] predniSONE (DELTASONE) 10 MG tablet 50 mg daily x 3 days, then 40 mg daily x 3 days, then 30 mg daily x 3 days, then 20 mg daily x 3 days, then 10 mg daily x 3 days.   No facility-administered encounter medications on file as of 09/09/2017.  Review of Systems  Constitutional: Negative for appetite change and unexpected weight change.  HENT: Negative for congestion and sinus pressure.   Respiratory: Negative for cough, chest tightness and shortness of breath.   Cardiovascular: Negative for chest pain, palpitations and leg swelling.  Gastrointestinal: Negative for abdominal pain, diarrhea, nausea and vomiting.  Genitourinary: Negative for difficulty urinating and dysuria.  Musculoskeletal:       Right foot pain as outlined.  Left hand, elbow and arm pain as outlined.    Skin: Negative for color change and rash.  Neurological: Negative for dizziness, light-headedness and headaches.  Psychiatric/Behavioral: Negative for agitation and dysphoric mood.       Objective:     Blood pressure rechecked by me:  144/78  Physical Exam  Constitutional: He appears well-developed and well-nourished. No distress.  HENT:  Nose: Nose normal.   Mouth/Throat: Oropharynx is clear and moist.  Neck: Neck supple. No thyromegaly present.  Cardiovascular: Normal rate and regular rhythm.   Pulmonary/Chest: Effort normal and breath sounds normal. No respiratory distress.  Abdominal: Soft. Bowel sounds are normal. There is no tenderness.  Musculoskeletal: He exhibits no edema or tenderness.  Increased pain to palpation over the base of the right great toe.  Increased soft tissue swelling in the area.  No increased erythema.  No pain to palpation over his left elbow.  No pain to palpation over his hand or upper arm.  No pain with rotation of his neck.    Lymphadenopathy:    He has no cervical adenopathy.  Skin: No rash noted. No erythema.  Psychiatric: He has a normal mood and affect. His behavior is normal.    BP (!) 150/80 (BP Location: Left Arm, Patient Position: Sitting, Cuff Size: Normal)   Pulse 65   Temp 98.7 F (37.1 C) (Oral)   Resp 14   Wt 158 lb 9.6 oz (71.9 kg)   SpO2 97%   BMI 26.39 kg/m  Wt Readings from Last 3 Encounters:  09/09/17 158 lb 9.6 oz (71.9 kg)  05/30/17 150 lb 6 oz (68.2 kg)  02/07/17 144 lb 9.6 oz (65.6 kg)     Lab Results  Component Value Date   WBC 7.1 09/09/2017   HGB 15.6 09/09/2017   HCT 47.1 09/09/2017   PLT 270.0 09/09/2017   GLUCOSE 88 09/09/2017   CHOL 251 (H) 09/09/2017   TRIG 121.0 09/09/2017   HDL 57.60 09/09/2017   LDLDIRECT 210.3 11/30/2013   LDLCALC 169 (H) 09/09/2017   ALT 23 09/09/2017   AST 19 09/09/2017   NA 137 09/09/2017   K 4.4 09/09/2017   CL 103 09/09/2017   CREATININE 0.96 09/09/2017   BUN 19 09/09/2017   CO2 28 09/09/2017   TSH 2.63 09/09/2017   PSA 2.23 12/19/2015   HGBA1C 6.0 09/09/2017       Assessment & Plan:   Problem List Items Addressed This Visit    Chronic leg pain    Takes tramadol prn.        Relevant Medications   traMADol (ULTRAM) 50 MG tablet   Elevated blood pressure reading    Recheck elevated.  Discussed with him today.  Will have  him spot check his pressure.  Get him back in soon to reassess.  Will need medication if persistent elevation.        GERD (gastroesophageal reflux disease)    Controlled on nexium.        Hand pain, left    Persistent hand pain  and stiffness.  Increased.  Also has intermittent elbow and arm/foot pain.  Will have rheumatology evaluate.  Tramadol prn.  Hold on increased antiinflammatories.        Relevant Orders   Ambulatory referral to Rheumatology   Hypercholesterolemia    On lovastatin.  Low cholesterol diet.  States taking medication.  Follow lipid panel and liver function tests.        Relevant Orders   CBC with Differential/Platelet (Completed)   TSH (Completed)   Lipid panel (Completed)   Hepatic function panel (Completed)   Hyperglycemia    Low carb diet and exercise.  Follow met b and a1c.        Relevant Orders   Hemoglobin A1c (Completed)   Basic metabolic panel (Completed)   Right foot pain    Persistent pain as outlined.  Check xray.        Relevant Orders   DG Foot 2 Views Right (Completed)   Ambulatory referral to Rheumatology   Tobacco abuse    Discussed the need to quit.  Discussed screening CT.  He declines.        Vitamin D deficiency - Primary    Recheck vitamin D level.       Relevant Orders   VITAMIN D 25 Hydroxy (Vit-D Deficiency, Fractures) (Completed)       Einar Pheasant, MD

## 2017-09-10 ENCOUNTER — Encounter: Payer: Self-pay | Admitting: Internal Medicine

## 2017-09-10 DIAGNOSIS — M79642 Pain in left hand: Secondary | ICD-10-CM | POA: Insufficient documentation

## 2017-09-10 NOTE — Assessment & Plan Note (Signed)
Persistent hand pain and stiffness.  Increased.  Also has intermittent elbow and arm/foot pain.  Will have rheumatology evaluate.  Tramadol prn.  Hold on increased antiinflammatories.

## 2017-09-10 NOTE — Assessment & Plan Note (Signed)
Low carb diet and exercise.  Follow met b and a1c.   

## 2017-09-10 NOTE — Assessment & Plan Note (Signed)
Discussed the need to quit.  Discussed screening CT.  He declines.

## 2017-09-10 NOTE — Assessment & Plan Note (Signed)
Persistent pain as outlined.  Check xray.   

## 2017-09-10 NOTE — Assessment & Plan Note (Signed)
Takes tramadol prn.   

## 2017-09-10 NOTE — Assessment & Plan Note (Signed)
Recheck elevated.  Discussed with him today.  Will have him spot check his pressure.  Get him back in soon to reassess.  Will need medication if persistent elevation.

## 2017-09-10 NOTE — Assessment & Plan Note (Signed)
Recheck vitamin D level 

## 2017-09-10 NOTE — Assessment & Plan Note (Signed)
On lovastatin.  Low cholesterol diet.  States taking medication.  Follow lipid panel and liver function tests.

## 2017-09-10 NOTE — Assessment & Plan Note (Signed)
Controlled on nexium.  

## 2017-09-13 ENCOUNTER — Other Ambulatory Visit: Payer: Self-pay | Admitting: Internal Medicine

## 2017-09-13 DIAGNOSIS — M79671 Pain in right foot: Secondary | ICD-10-CM

## 2017-09-13 NOTE — Progress Notes (Signed)
Order placed for podiatry referral.   

## 2017-10-04 ENCOUNTER — Encounter: Payer: Self-pay | Admitting: Podiatry

## 2017-10-04 ENCOUNTER — Ambulatory Visit (INDEPENDENT_AMBULATORY_CARE_PROVIDER_SITE_OTHER): Payer: 59 | Admitting: Podiatry

## 2017-10-04 DIAGNOSIS — M7751 Other enthesopathy of right foot: Secondary | ICD-10-CM

## 2017-10-04 MED ORDER — MELOXICAM 15 MG PO TABS: 15.0000 mg | ORAL_TABLET | Freq: Every day | ORAL | 1 refills | Status: AC

## 2017-10-05 ENCOUNTER — Encounter: Payer: Self-pay | Admitting: Internal Medicine

## 2017-10-10 NOTE — Progress Notes (Signed)
   HPI: 56 year old male presenting today as a new patient with a complaint of intermittent pain to the left great toe onset 6 months ago. Being on his feet for extended periods of time exacerbate the pain. He denies alleviating factors. He has not done anything to treat the pain. Patient is here for further evaluation and treatment. He reports h/o right ankle fracture and was treated by Dr. Sunny Schlein several years ago.   Past Medical History:  Diagnosis Date  . Chronic leg pain    s/p injury right leg (age 12)  . GERD (gastroesophageal reflux disease)   . History of ankle fracture    persistent pain  . Hypercholesterolemia   . Nephrolithiasis    followed by Assunta Gambles  . Vitamin D deficiency      Physical Exam: General: The patient is alert and oriented x3 in no acute distress.  Dermatology: Skin is warm, dry and supple bilateral lower extremities. Negative for open lesions or macerations.  Vascular: Palpable pedal pulses bilaterally. No edema or erythema noted. Capillary refill within normal limits.  Neurological: Epicritic and protective threshold grossly intact bilaterally.   Musculoskeletal Exam: Pain on palpation to the 1st MPJ of the right foot. Range of motion within normal limits to all pedal and ankle joints bilateral. Muscle strength 5/5 in all groups bilateral.    Assessment: - 1st MPJ capsulitis right     Plan of Care:  - Patient evaluated. X-Rays done on 09/09/17 from PCP. - Pt declined injection. - Prescription for Meloxicam 15 mg given to patient. - Return to clinic when necessary.    Felecia Shelling, DPM [facility]  Dr. Felecia Shelling, DPM    [address] [place], [place] [address]                Office [phone]  Fax [phone]

## 2017-10-17 ENCOUNTER — Other Ambulatory Visit: Payer: Self-pay | Admitting: Internal Medicine

## 2017-10-17 DIAGNOSIS — M79604 Pain in right leg: Principal | ICD-10-CM

## 2017-10-17 DIAGNOSIS — G8929 Other chronic pain: Secondary | ICD-10-CM

## 2017-10-17 NOTE — Telephone Encounter (Signed)
Last OV was 09/09/17, last refill was 09/09/17, #30 with no refills, please advise, thanks

## 2017-10-17 NOTE — Telephone Encounter (Signed)
rx ok'd for tramadol #30 with no refills.  

## 2017-10-17 NOTE — Telephone Encounter (Signed)
Faxed

## 2017-10-18 MED ORDER — BETAMETHASONE SOD PHOS & ACET 6 (3-3) MG/ML IJ SUSP: 3.0000 mg | Freq: Once | INTRAMUSCULAR | Status: DC

## 2017-11-04 ENCOUNTER — Encounter: Payer: Self-pay | Admitting: Internal Medicine

## 2017-11-04 ENCOUNTER — Other Ambulatory Visit: Payer: Self-pay

## 2017-11-04 ENCOUNTER — Ambulatory Visit: Payer: 59 | Admitting: Internal Medicine

## 2017-11-04 DIAGNOSIS — M79671 Pain in right foot: Secondary | ICD-10-CM | POA: Diagnosis not present

## 2017-11-04 DIAGNOSIS — R739 Hyperglycemia, unspecified: Secondary | ICD-10-CM

## 2017-11-04 DIAGNOSIS — I10 Essential (primary) hypertension: Secondary | ICD-10-CM

## 2017-11-04 DIAGNOSIS — Z23 Encounter for immunization: Secondary | ICD-10-CM

## 2017-11-04 DIAGNOSIS — E559 Vitamin D deficiency, unspecified: Secondary | ICD-10-CM

## 2017-11-04 DIAGNOSIS — M79602 Pain in left arm: Secondary | ICD-10-CM | POA: Diagnosis not present

## 2017-11-04 DIAGNOSIS — E7800 Pure hypercholesterolemia, unspecified: Secondary | ICD-10-CM

## 2017-11-04 DIAGNOSIS — F17200 Nicotine dependence, unspecified, uncomplicated: Secondary | ICD-10-CM

## 2017-11-04 MED ORDER — LISINOPRIL 10 MG PO TABS: 10.0000 mg | ORAL_TABLET | Freq: Every day | ORAL | 1 refills | Status: DC

## 2017-11-04 NOTE — Progress Notes (Signed)
[name], male   DOB: 25-Oct-1961, 56 y.o.   MRN: [id number]   Subjective:    [name], male    DOB: 1961-08-13, 56 y.o.   MRN: [id number]  HPI  Patient here for a scheduled follow up.  States he is doing relatively well.  Saw rheumatology for his left arm pain.  Feels related to his neck.  Was given flexeril.  Xray ordered.  Referred to physical therapy.  Plans to start physical therapy today.  Also saw podiatry.  Desires no further intervention.  States offered injection.  No chest pain.  Breathing stable.  Discussed the need to quit smoking.  No abdominal pain.  Bowels moving.  Here to f/u on his blood pressure.  Still elevated here in the office.    Past Medical History:  Diagnosis Date  . Chronic leg pain    s/p injury right leg (age 42)  . GERD (gastroesophageal reflux disease)   . History of ankle fracture    persistent pain  . Hypercholesterolemia   . Nephrolithiasis    followed by Edrick Oh  . Vitamin D deficiency    Past Surgical History:  Procedure Laterality Date  . ABDOMINAL SURGERY     after accident.  repaired spleen  . LEG SURGERY     Family History  Problem Relation Age of Onset  . Colon cancer Father   . Heart disease Father        has a pacemaker and defibrillator  . Hypertension Father   . Diabetes Father    Social History   Socioeconomic History  . Marital status: Married    Spouse name: None  . Number of children: None  . Years of education: None  . Highest education level: None  Social Needs  . Financial resource strain: None  . Food insecurity - worry: None  . Food insecurity - inability: None  . Transportation needs - medical: None  . Transportation needs - non-medical: None  Occupational History  . None  Tobacco Use  . Smoking status: Current Every Day Smoker  . 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 Alcohol use: No    Alcohol/week: 0.0 oz  . Drug use: No  . Sexual  activity: None  Other Topics Concern  . None  Social History Narrative  . None    Outpatient Encounter Medications as of 11/04/2017  Medication Sig  . cyclobenzaprine (FLEXERIL) 5 MG tablet Begin only at night time, as may cause drowsiness.  Marland Kitchen esomeprazole (NEXIUM) 40 MG capsule Take 20 mg by mouth daily.  Marland Kitchen lovastatin (MEVACOR) 40 MG tablet TAKE ONE TABLET BY MOUTH AT BEDTIME  . traMADol (ULTRAM) 50 MG tablet TAKE ONE TABLET DAILY AS NEEDED  . lisinopril (PRINIVIL,ZESTRIL) 10 MG tablet Take 1 tablet (10 mg total) daily by mouth.   Facility-Administered Encounter Medications as of 11/04/2017  Medication  . betamethasone acetate-betamethasone sodium phosphate (CELESTONE) injection 3 mg    Review of Systems  Constitutional: Negative for appetite change and unexpected weight change.  HENT: Negative for congestion and sinus pressure.   Respiratory: Negative for cough, chest tightness and shortness of breath.   Cardiovascular: Negative for chest pain, palpitations and leg swelling.  Gastrointestinal: Negative for abdominal pain, diarrhea and nausea.  Genitourinary: Negative for difficulty urinating and dysuria.  Musculoskeletal: Negative for myalgias.       Left arm pain as outlined.    Skin: Negative for color change  and rash.  Neurological: Negative for dizziness, light-headedness and headaches.  Psychiatric/Behavioral: Negative for agitation and dysphoric mood.       Objective:    Physical Exam  Constitutional: He appears well-developed and well-nourished. No distress.  HENT:  Nose: Nose normal.  Mouth/Throat: Oropharynx is clear and moist.  Neck: Neck supple. No thyromegaly present.  Cardiovascular: Normal rate and regular rhythm.  Pulmonary/Chest: Effort normal and breath sounds normal. No respiratory distress.  Abdominal: Soft. Bowel sounds are normal. There is no tenderness.  Musculoskeletal: He exhibits no edema or tenderness.  Lymphadenopathy:    He has no cervical  adenopathy.  Skin: No rash noted. No erythema.  Psychiatric: He has a normal mood and affect. His behavior is normal.    BP (!) 148/84 (BP Location: Left Arm, Patient Position: Sitting, Cuff Size: Normal)   Pulse 84   Temp 97.6 F (36.4 C) (Oral)   Resp 14   Ht 5' 5" (1.651 m)   Wt 155 lb 9.6 oz (70.6 kg)   SpO2 98%   BMI 25.89 kg/m  Wt Readings from Last 3 Encounters:  11/04/17 155 lb 9.6 oz (70.6 kg)  09/09/17 158 lb 9.6 oz (71.9 kg)  05/30/17 150 lb 6 oz (68.2 kg)     Lab Results  Component Value Date   WBC 7.1 09/09/2017   HGB 15.6 09/09/2017   HCT 47.1 09/09/2017   PLT 270.0 09/09/2017   GLUCOSE 88 09/09/2017   CHOL 251 (H) 09/09/2017   TRIG 121.0 09/09/2017   HDL 57.60 09/09/2017   LDLDIRECT 210.3 11/30/2013   LDLCALC 169 (H) 09/09/2017   ALT 23 09/09/2017   AST 19 09/09/2017   NA 137 09/09/2017   K 4.4 09/09/2017   CL 103 09/09/2017   CREATININE 0.96 09/09/2017   BUN 19 09/09/2017   CO2 28 09/09/2017   TSH 2.63 09/09/2017   PSA 2.23 12/19/2015   HGBA1C 6.0 09/09/2017       Assessment & Plan:   Problem List Items Addressed This Visit    Hyperglycemia    Low carb diet and exercise.  Follow met b and a1c.        Hypertension    Blood pressure remains elevated.  Start lisinopril.  Follow pressures.  Follow metabolic panel.        Relevant Medications   lisinopril (PRINIVIL,ZESTRIL) 10 MG tablet   Other Relevant Orders   Basic metabolic panel   Left arm pain    Saw rheumatology.  Xray.  Referred to therapy.  Taking a muscle relaxer.  Starts therapy today.        Pure hypercholesterolemia    On lovastatin.  Low cholesterol diet and exercise.  Follow lipid panel and liver function tests.        Relevant Medications   lisinopril (PRINIVIL,ZESTRIL) 10 MG tablet   Right foot pain    Referred to podiatry.  See note.  Declines any further evaluation or treatment.        Tobacco use disorder    Discussed the need to quit smoking.  He declines.   Discussed screening CT.  He declines.        Vitamin D deficiency, unspecified    Continue vitamin D supplements.  Follow vitamin D level.         Other Visit Diagnoses    Need for immunization against influenza       Relevant Orders   Flu Vaccine QUAD 36+ mos IM (Completed)  Einar Pheasant, MD

## 2017-11-07 ENCOUNTER — Encounter: Payer: Self-pay | Admitting: Internal Medicine

## 2017-11-07 NOTE — Assessment & Plan Note (Signed)
Continue vitamin D supplements.  Follow vitamin D level.   

## 2017-11-07 NOTE — Assessment & Plan Note (Signed)
Discussed the need to quit smoking.  He declines.  Discussed screening CT.  He declines.

## 2017-11-07 NOTE — Assessment & Plan Note (Signed)
Low carb diet and exercise.  Follow met b and a1c.   

## 2017-11-07 NOTE — Assessment & Plan Note (Signed)
Referred to podiatry.  See note.  Declines any further evaluation or treatment.

## 2017-11-07 NOTE — Assessment & Plan Note (Signed)
Saw rheumatology.  Xray.  Referred to therapy.  Taking a muscle relaxer.  Starts therapy today.

## 2017-11-07 NOTE — Assessment & Plan Note (Signed)
Blood pressure remains elevated.  Start lisinopril.  Follow pressures.  Follow metabolic panel.   

## 2017-11-07 NOTE — Assessment & Plan Note (Signed)
On lovastatin.  Low cholesterol diet and exercise.  Follow lipid panel and liver function tests.   

## 2017-11-18 ENCOUNTER — Other Ambulatory Visit: Payer: Self-pay | Admitting: Internal Medicine

## 2017-11-18 DIAGNOSIS — G8929 Other chronic pain: Secondary | ICD-10-CM

## 2017-11-18 DIAGNOSIS — M79604 Pain in right leg: Principal | ICD-10-CM

## 2017-11-21 NOTE — Telephone Encounter (Signed)
Okay to refill Tramadol? Last refilled on 10/17/17 #30 (no refills).  LOV: 11/04/17 NOV: 01/06/18

## 2017-11-21 NOTE — Telephone Encounter (Signed)
rx ok'd for tramadol #30 with no refills.  Signed and placed in my CMA box.

## 2017-11-22 ENCOUNTER — Telehealth: Payer: Self-pay | Admitting: Internal Medicine

## 2017-11-22 NOTE — Telephone Encounter (Signed)
Please advise 

## 2017-11-22 NOTE — Telephone Encounter (Signed)
Copied from CRM [id number]. Topic: Quick Communication - See Telephone Encounter >> Nov 22, 2017  1:37 PM Diana EvesHoyt, Maryann B wrote: CRM for notification. See Telephone encounter for:  Pt bp med lisinopril is dropping his bp to low he almost passed out when they took it at home it was 90/70. Last night at bed time it was 107/80 11/22/17.

## 2017-11-22 NOTE — Telephone Encounter (Signed)
Pt confirmed only symptom was the feeling of passing out but did not actually pass out. He already stopped lisinopril 2 days ago, feeling a lot better. Says he's "100% better". Advised to continue to check BP and call us if any new symptoms occur or BP starts to rise

## 2017-11-22 NOTE — Telephone Encounter (Signed)
Confirmed stopping as he has done. Agreed to monitor BP over the next few days and will call in the readings. Will call with any other symptoms.

## 2017-11-22 NOTE — Telephone Encounter (Signed)
Call and notify pt, agree with him stopping as he has done.  Feels better.  Needs to monitor blood pressure and call in readings over the next few days.  Any problems, needs to be evaluated.

## 2017-11-25 ENCOUNTER — Other Ambulatory Visit (INDEPENDENT_AMBULATORY_CARE_PROVIDER_SITE_OTHER): Payer: 59

## 2017-11-25 DIAGNOSIS — I10 Essential (primary) hypertension: Secondary | ICD-10-CM | POA: Diagnosis not present

## 2017-11-25 LAB — BASIC METABOLIC PANEL
BUN: 18 mg/dL (ref 6–23)
CHLORIDE: 102 meq/L (ref 96–112)
CO2: 30 meq/L (ref 19–32)
CREATININE: 0.93 mg/dL (ref 0.40–1.50)
Calcium: 9.6 mg/dL (ref 8.4–10.5)
GFR: 89.16 mL/min (ref >60.00)
Glucose, Bld: 125 mg/dL — ABNORMAL HIGH (ref 70–99)
POTASSIUM: 4.5 meq/L (ref 3.5–5.1)
SODIUM: 138 meq/L (ref 135–145)

## 2017-12-02 DIAGNOSIS — G8929 Other chronic pain: Secondary | ICD-10-CM | POA: Insufficient documentation

## 2017-12-02 DIAGNOSIS — M5030 Other cervical disc degeneration, unspecified cervical region: Secondary | ICD-10-CM | POA: Insufficient documentation

## 2017-12-21 ENCOUNTER — Other Ambulatory Visit: Payer: Self-pay | Admitting: Internal Medicine

## 2017-12-22 ENCOUNTER — Other Ambulatory Visit: Payer: Self-pay | Admitting: Internal Medicine

## 2017-12-22 DIAGNOSIS — M79604 Pain in right leg: Principal | ICD-10-CM

## 2017-12-22 DIAGNOSIS — G8929 Other chronic pain: Secondary | ICD-10-CM

## 2017-12-23 NOTE — Telephone Encounter (Signed)
faxed

## 2017-12-23 NOTE — Telephone Encounter (Signed)
Refilled: 11/21/2017 Last OV: 11/04/2017 Next OV: 01/06/2018

## 2017-12-24 IMAGING — DX DG CHEST 2V
2 series · 2 of 2 positions shown · non-contrast
Comparison: None.

CLINICAL DATA: Cough.

EXAM:
CHEST  2 VIEW

[chest pa]
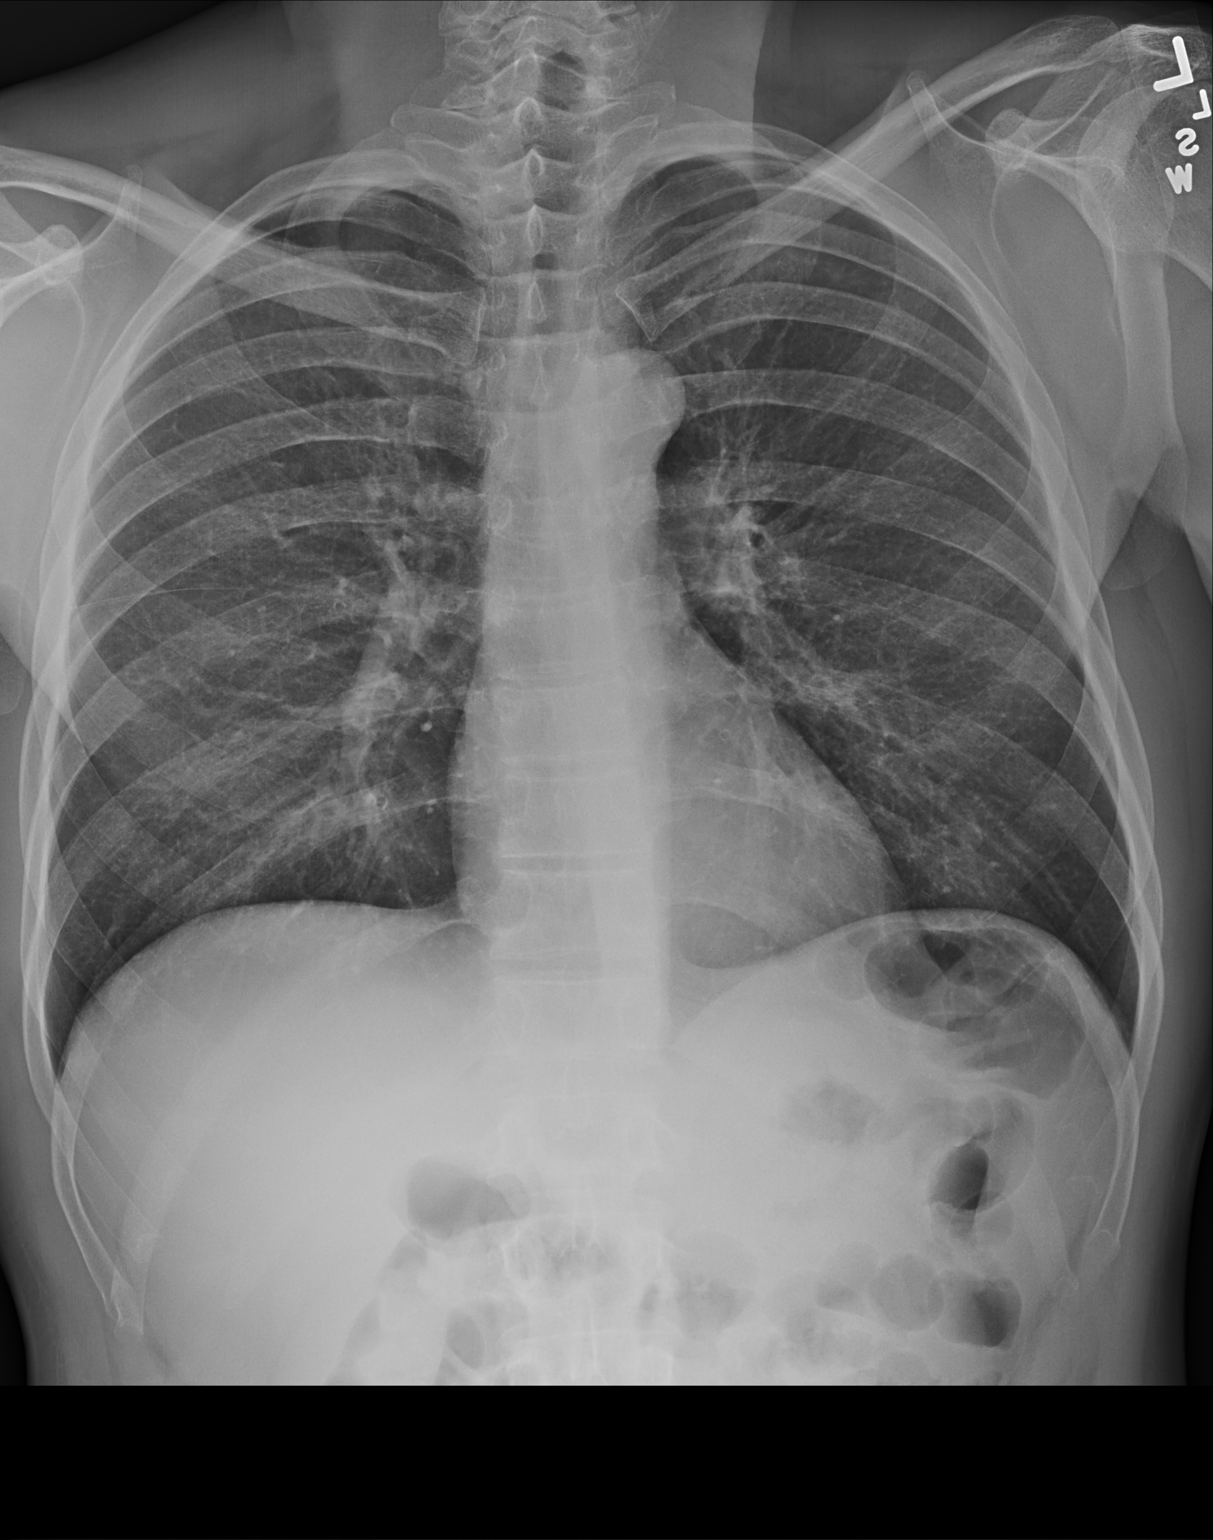

[chest lat]
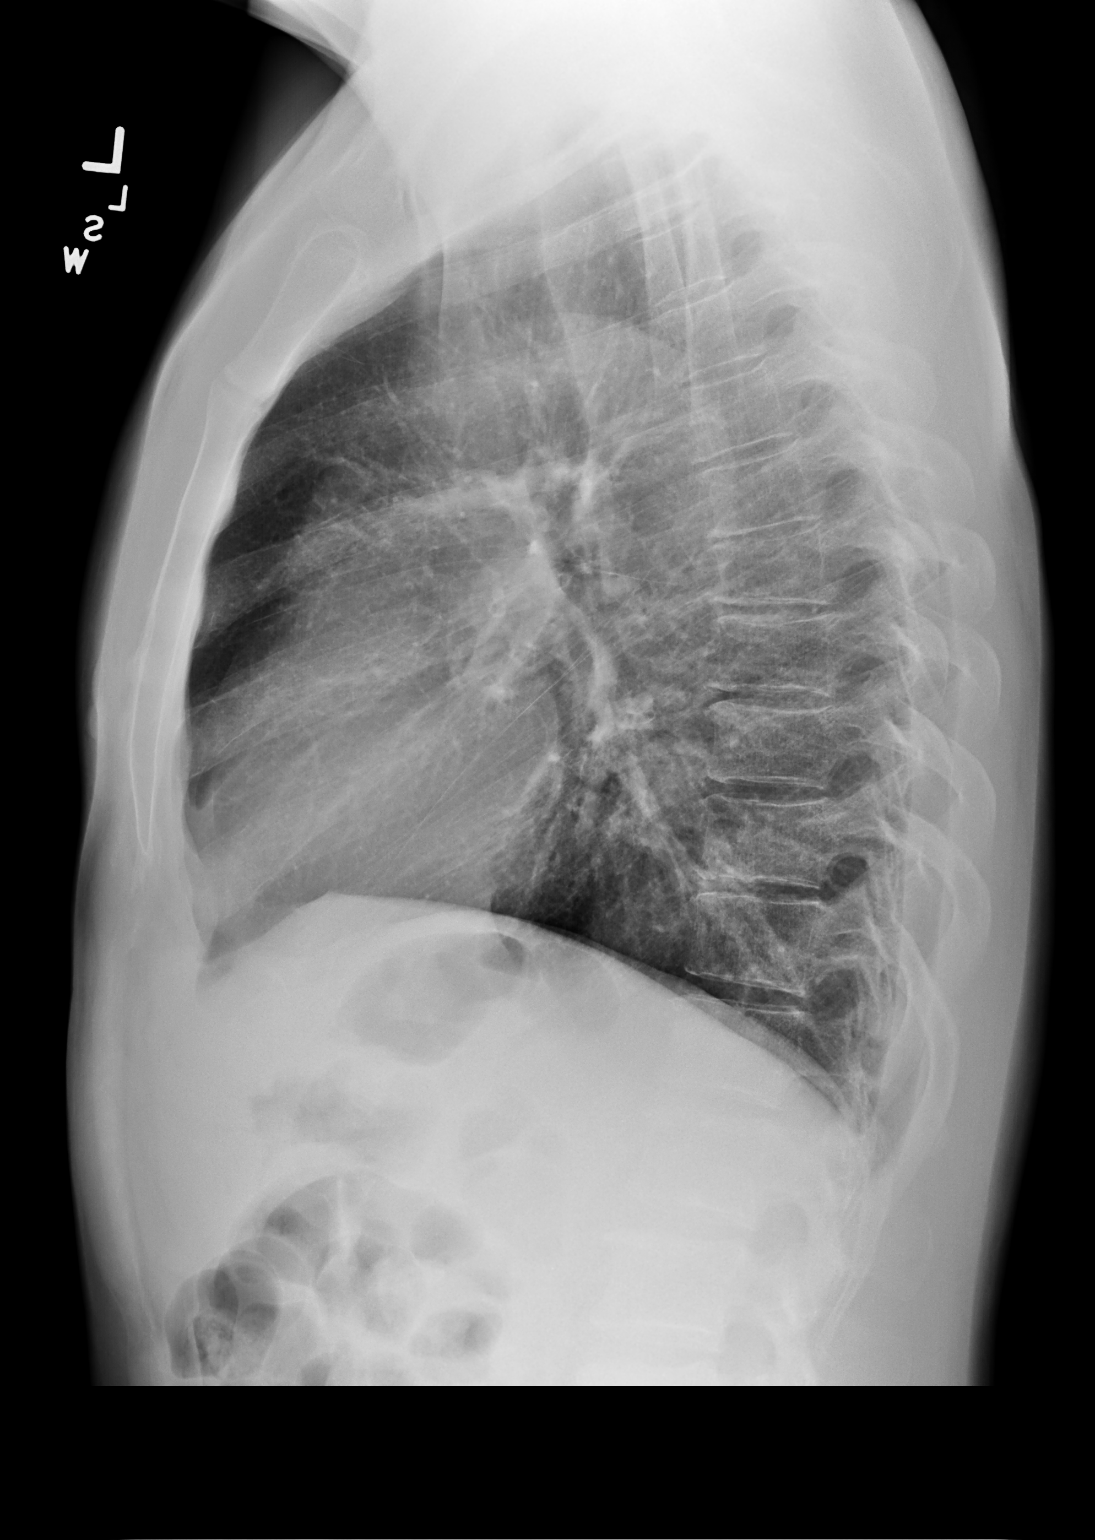

[2 of 2 positions shown; findings below may reference images not displayed]

FINDINGS: Lungs clear. Heart size normal. No pneumothorax or pleural fluid. No
bony abnormality.
IMPRESSION: Negative chest.

## 2018-01-06 ENCOUNTER — Encounter: Payer: Self-pay | Admitting: Internal Medicine

## 2018-01-06 ENCOUNTER — Ambulatory Visit: Payer: 59 | Admitting: Internal Medicine

## 2018-01-06 VITALS — BP 140/90 | HR 77 | Temp 97.5°F | Ht 65.0 in | Wt 161.2 lb

## 2018-01-06 DIAGNOSIS — R739 Hyperglycemia, unspecified: Secondary | ICD-10-CM | POA: Diagnosis not present

## 2018-01-06 DIAGNOSIS — E559 Vitamin D deficiency, unspecified: Secondary | ICD-10-CM

## 2018-01-06 DIAGNOSIS — I10 Essential (primary) hypertension: Secondary | ICD-10-CM

## 2018-01-06 DIAGNOSIS — M79602 Pain in left arm: Secondary | ICD-10-CM

## 2018-01-06 DIAGNOSIS — K219 Gastro-esophageal reflux disease without esophagitis: Secondary | ICD-10-CM

## 2018-01-06 DIAGNOSIS — F17200 Nicotine dependence, unspecified, uncomplicated: Secondary | ICD-10-CM

## 2018-01-06 DIAGNOSIS — M79609 Pain in unspecified limb: Secondary | ICD-10-CM | POA: Diagnosis not present

## 2018-01-06 DIAGNOSIS — E7800 Pure hypercholesterolemia, unspecified: Secondary | ICD-10-CM

## 2018-01-06 LAB — BASIC METABOLIC PANEL
BUN: 18 mg/dL (ref 6–23)
CHLORIDE: 101 meq/L (ref 96–112)
CO2: 31 mEq/L (ref 19–32)
Calcium: 9.4 mg/dL (ref 8.4–10.5)
Creatinine, Ser: 0.85 mg/dL (ref 0.40–1.50)
GFR: 98.88 mL/min (ref >60.00)
Glucose, Bld: 93 mg/dL (ref 70–99)
POTASSIUM: 4.3 meq/L (ref 3.5–5.1)
SODIUM: 136 meq/L (ref 135–145)

## 2018-01-06 LAB — HEPATIC FUNCTION PANEL
ALK PHOS: 60 U/L (ref 39–117)
ALT: 29 U/L (ref 0–53)
AST: 22 U/L (ref 0–37)
Albumin: 4.5 g/dL (ref 3.5–5.2)
BILIRUBIN DIRECT: 0.1 mg/dL (ref 0.0–0.3)
BILIRUBIN TOTAL: 0.5 mg/dL (ref 0.2–1.2)
TOTAL PROTEIN: 7.3 g/dL (ref 6.0–8.3)

## 2018-01-06 LAB — LIPID PANEL
Cholesterol: 200 mg/dL (ref 0–200)
HDL: 49.3 mg/dL (ref >39.00)
LDL Cholesterol: 132 mg/dL — ABNORMAL HIGH (ref 0–99)
NonHDL: 151.09
TRIGLYCERIDES: 97 mg/dL (ref 0.0–149.0)
Total CHOL/HDL Ratio: 4
VLDL: 19.4 mg/dL (ref 0.0–40.0)

## 2018-01-06 LAB — HEMOGLOBIN A1C: HEMOGLOBIN A1C: 6.2 % (ref 4.6–6.5)

## 2018-01-06 NOTE — Progress Notes (Signed)
[name], male   DOB: December 28, 1960, 57 y.o.   MRN: [id number]   Subjective:    Patient ID: PAIGE VANDERWOUDE, male    DOB: 02-24-1961, 57 y.o.   MRN: [id number]  HPI  Patient here for a scheduled follow up.  He reports he is doing relatively well.  I had started him on lisinopril last visit.  He stopped - secondary to low blood pressure.  Discussed blood pressure with him today.  Discussed slight elevation.  Discussed trying a different low dose blood pressure medication.  He declines.  He reports some nasal congestion and sneezing.  Discussed nasal sprays.  He refuses.  Can take robitussin.  No significant symptoms currently.  No headache.  No dizziness. No chest pain.  No sob.  No acid reflux.  No abdominal pain.  Bowels moving.  Saw rheumatology recently.  DDD of cervical spine.  This is felt to be the etiology of his left arm pain.  Stable.  Did a lot of heavy lifting and was in a strained position recently.  Some left low back pain now.  Getting better.  No radiation of pain.  Overall feels stable. States his blood pressure has been averaging 130/85-90.   Past Medical History:  Diagnosis Date  . Chronic leg pain    s/p injury right leg (age 62)  . GERD (gastroesophageal reflux disease)   . History of ankle fracture    persistent pain  . Hypercholesterolemia   . Nephrolithiasis    followed by Edrick Oh  . Vitamin D deficiency    Past Surgical History:  Procedure Laterality Date  . ABDOMINAL SURGERY     after accident.  repaired spleen  . LEG SURGERY     Family History  Problem Relation Age of Onset  . Colon cancer Father   . Heart disease Father        has a pacemaker and defibrillator  . Hypertension Father   . Diabetes Father    Social History   Socioeconomic History  . Marital status: Married    Spouse name: None  . Number of children: None  . Years of education: None  . Highest education level: None  Social Needs  . Financial resource strain: None    . Food insecurity - worry: None  . Food insecurity - inability: None  . Transportation needs - medical: None  . Transportation needs - non-medical: None  Occupational History  . None  Tobacco Use  . Smoking status: Current Every Day Smoker  . 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 Alcohol use: No    Alcohol/week: 0.0 oz  . Drug use: No  . Sexual activity: None  Other Topics Concern  . None  Social History Narrative  . None    Outpatient Encounter Medications as of 01/06/2018  Medication Sig  . cholecalciferol (VITAMIN D) 400 units TABS tablet Take 400 Units by mouth.  . cyclobenzaprine (FLEXERIL) 5 MG tablet Begin only at night time, as may cause drowsiness.  Marland Kitchen esomeprazole (NEXIUM) 40 MG capsule Take 20 mg by mouth daily.  Marland Kitchen lovastatin (MEVACOR) 40 MG tablet TAKE ONE TABLET BY MOUTH AT BEDTIME  . traMADol (ULTRAM) 50 MG tablet TAKE 1 TABLET ONCE A DAY  . [DISCONTINUED] lisinopril (PRINIVIL,ZESTRIL) 10 MG tablet Take 1 tablet (10 mg total) daily by mouth.   Facility-Administered Encounter Medications as of 01/06/2018  Medication  . betamethasone acetate-betamethasone sodium phosphate (CELESTONE) injection 3 mg  Review of Systems  Constitutional: Negative for appetite change and unexpected weight change.  HENT: Positive for congestion and postnasal drip.   Respiratory: Negative for cough, chest tightness and shortness of breath.   Cardiovascular: Negative for chest pain, palpitations and leg swelling.  Gastrointestinal: Negative for abdominal pain, diarrhea, nausea and vomiting.  Genitourinary: Negative for difficulty urinating and dysuria.  Musculoskeletal: Positive for back pain. Negative for joint swelling.  Skin: Negative for color change and rash.  Neurological: Negative for dizziness, light-headedness and headaches.  Psychiatric/Behavioral: Negative for agitation and dysphoric mood.       Objective:    Physical Exam  Constitutional:  He appears well-developed and well-nourished. No distress.  HENT:  Nose: Nose normal.  Mouth/Throat: Oropharynx is clear and moist.  Neck: Neck supple. No thyromegaly present.  Cardiovascular: Normal rate and regular rhythm.  Pulmonary/Chest: Effort normal and breath sounds normal. No respiratory distress.  Abdominal: Soft. Bowel sounds are normal. There is no tenderness.  Musculoskeletal: He exhibits no edema or tenderness.  Lymphadenopathy:    He has no cervical adenopathy.  Skin: No rash noted. No erythema.  Psychiatric: He has a normal mood and affect. His behavior is normal.    BP 140/90   Pulse 77   Temp (!) 97.5 F (36.4 C) (Oral)   Ht 5' 5" (1.651 m)   Wt 161 lb 3.2 oz (73.1 kg)   SpO2 98%   BMI 26.83 kg/m  Wt Readings from Last 3 Encounters:  01/06/18 161 lb 3.2 oz (73.1 kg)  11/04/17 155 lb 9.6 oz (70.6 kg)  09/09/17 158 lb 9.6 oz (71.9 kg)     Lab Results  Component Value Date   WBC 7.1 09/09/2017   HGB 15.6 09/09/2017   HCT 47.1 09/09/2017   PLT 270.0 09/09/2017   GLUCOSE 93 01/06/2018   CHOL 200 01/06/2018   TRIG 97.0 01/06/2018   HDL 49.30 01/06/2018   LDLDIRECT 210.3 11/30/2013   LDLCALC 132 (H) 01/06/2018   ALT 29 01/06/2018   AST 22 01/06/2018   NA 136 01/06/2018   K 4.3 01/06/2018   CL 101 01/06/2018   CREATININE 0.85 01/06/2018   BUN 18 01/06/2018   CO2 31 01/06/2018   TSH 2.63 09/09/2017   PSA 2.23 12/19/2015   HGBA1C 6.2 01/06/2018       Assessment & Plan:   Problem List Items Addressed This Visit    Esophageal reflux    Controlled on nexium.       Hyperglycemia    Low carb diet and exercise.  Follow met b and a1c.        Relevant Orders   Hemoglobin A1c (Completed)   Hypertension - Primary    Blood pressure remains slightly elevated.  Off medication.  States averaging 130/85-90 at home.  Discussed a trial of a different low dose blood pressure medication.  He declines.  Follow pressures.  Diet and exercise.         Relevant Orders   Basic metabolic panel (Completed)   Left arm pain    Saw rheumatology.  Diagnosed with DDD of c-spine.  Stable.        Pain in soft tissues of limb    Saw rheumatology.  Note reviewed.  Stable.        Pure hypercholesterolemia    On lovastatin.  Low cholesterol diet and exercise.  Follow lipid panel and liver function tests.        Relevant Orders   Lipid panel (  Completed)   Hepatic function panel (Completed)   Tobacco use disorder    Discussed with him today regarding the need to quit smoking.  He declines to quit.  Also discussed screening CT chest.  He declines.        Vitamin D deficiency, unspecified    Continue vitamin D supplements.  Follow vitamin D level.           Einar Pheasant, MD

## 2018-01-06 NOTE — Progress Notes (Signed)
Pre visit review using our clinic review tool, if applicable. No additional management support is needed unless otherwise documented below in the visit note. 

## 2018-01-08 ENCOUNTER — Encounter: Payer: Self-pay | Admitting: Internal Medicine

## 2018-01-08 NOTE — Assessment & Plan Note (Signed)
On lovastatin.  Low cholesterol diet and exercise.  Follow lipid panel and liver function tests.   

## 2018-01-08 NOTE — Assessment & Plan Note (Signed)
Saw rheumatology.  Note reviewed.  Stable.

## 2018-01-08 NOTE — Assessment & Plan Note (Signed)
Controlled on nexium.  

## 2018-01-08 NOTE — Assessment & Plan Note (Signed)
Blood pressure remains slightly elevated.  Off medication.  States averaging 130/85-90 at home.  Discussed a trial of a different low dose blood pressure medication.  He declines.  Follow pressures.  Diet and exercise.

## 2018-01-08 NOTE — Assessment & Plan Note (Signed)
Continue vitamin D supplements.  Follow vitamin D level.   

## 2018-01-08 NOTE — Assessment & Plan Note (Signed)
Saw rheumatology.  Diagnosed with DDD of c-spine.  Stable.

## 2018-01-08 NOTE — Assessment & Plan Note (Signed)
Discussed with him today regarding the need to quit smoking.  He declines to quit.  Also discussed screening CT chest.  He declines.

## 2018-01-08 NOTE — Assessment & Plan Note (Signed)
Low carb diet and exercise.  Follow met b and a1c.   

## 2018-01-23 ENCOUNTER — Other Ambulatory Visit: Payer: Self-pay | Admitting: Internal Medicine

## 2018-01-23 DIAGNOSIS — M79604 Pain in right leg: Principal | ICD-10-CM

## 2018-01-23 DIAGNOSIS — G8929 Other chronic pain: Secondary | ICD-10-CM

## 2018-01-23 NOTE — Telephone Encounter (Signed)
rx ok'd for tramadol #30 with one refill.   

## 2018-01-23 NOTE — Telephone Encounter (Signed)
Refilled: 12/23/2017 Last OV: 01/06/2018 Next OV: 05/19/2018

## 2018-01-24 NOTE — Telephone Encounter (Signed)
Faxed

## 2018-05-09 ENCOUNTER — Telehealth: Payer: Self-pay | Admitting: Internal Medicine

## 2018-05-09 ENCOUNTER — Other Ambulatory Visit: Payer: Self-pay | Admitting: Internal Medicine

## 2018-05-09 NOTE — Telephone Encounter (Signed)
Needs to be evaluated if needs oral prednisone.

## 2018-05-09 NOTE — Telephone Encounter (Signed)
Patient stated that poison oak is on his genitals and some on his arms. He got in it messing with his wifes flowers as he has done in the past. He requested a prednisone taper since it has been effective for him every other time that he has had poison oak

## 2018-05-09 NOTE — Telephone Encounter (Signed)
Copied from CRM [id number]. Topic: Quick Communication - See Telephone Encounter >> May 09, 2018  8:45 AM Eston Mould B wrote: CRM for notification. See Telephone encounter for: 05/09/18.  PT wants to know if he can get prednisone called into pharmacy for poison oak  on his private area  [facility] - [place], [place] - [address] [phone] (Phone) [phone] (Fax)

## 2018-05-09 NOTE — Telephone Encounter (Signed)
What area of skin is affected?  If just small area, would recommend hydrocortisone cream - (if not on face or genital area).  If large area and may need prednisone, will need to be evaluated.

## 2018-05-09 NOTE — Telephone Encounter (Signed)
Ok to send in prednisone taper or advise evaluation at walk-in or urgent care?

## 2018-05-09 NOTE — Telephone Encounter (Signed)
Called patient and he states that he got into some poison oak last week. He is having some itching and burning, no complaints of fever or oozing from bumps. He would like some prednisone called in . Please advise

## 2018-05-09 NOTE — Telephone Encounter (Signed)
Called patient to let him know that he would need to be evaluated. Patient stated that he did not have time to be evaluated. He knows what he needs and would not abuse the medication. Advised that PCP was not in office this PM and would feel more comfortable if he was evaluated. Patient stated "he is 57 yrs old and this is not his first rodeo." Advised that I was not able to call in any medication with out Dr. Roby Lofts approval. Also stated that I would send message back to PCP but had already been told that she was not going to call in medication. Patient stated "what good is the doctor doing me then?" Pt then told me thank you and hung up the phone.

## 2018-05-18 ENCOUNTER — Other Ambulatory Visit: Payer: Self-pay | Admitting: Internal Medicine

## 2018-05-18 DIAGNOSIS — G8929 Other chronic pain: Secondary | ICD-10-CM

## 2018-05-18 DIAGNOSIS — M79604 Pain in right leg: Principal | ICD-10-CM

## 2018-05-18 NOTE — Telephone Encounter (Signed)
Will refill at office visit on 05/19/18 with Dr. Lorin Picket

## 2018-05-19 ENCOUNTER — Encounter: Payer: Self-pay | Admitting: Internal Medicine

## 2018-05-19 ENCOUNTER — Ambulatory Visit (INDEPENDENT_AMBULATORY_CARE_PROVIDER_SITE_OTHER): Payer: 59 | Admitting: Internal Medicine

## 2018-05-19 VITALS — BP 146/98 | HR 72 | Temp 97.6°F | Resp 18 | Ht 65.0 in | Wt 167.2 lb

## 2018-05-19 DIAGNOSIS — Z0000 Encounter for general adult medical examination without abnormal findings: Secondary | ICD-10-CM

## 2018-05-19 DIAGNOSIS — I10 Essential (primary) hypertension: Secondary | ICD-10-CM

## 2018-05-19 DIAGNOSIS — F17200 Nicotine dependence, unspecified, uncomplicated: Secondary | ICD-10-CM | POA: Diagnosis not present

## 2018-05-19 DIAGNOSIS — R739 Hyperglycemia, unspecified: Secondary | ICD-10-CM | POA: Diagnosis not present

## 2018-05-19 DIAGNOSIS — Z125 Encounter for screening for malignant neoplasm of prostate: Secondary | ICD-10-CM

## 2018-05-19 DIAGNOSIS — K219 Gastro-esophageal reflux disease without esophagitis: Secondary | ICD-10-CM

## 2018-05-19 DIAGNOSIS — E7800 Pure hypercholesterolemia, unspecified: Secondary | ICD-10-CM | POA: Diagnosis not present

## 2018-05-19 DIAGNOSIS — E559 Vitamin D deficiency, unspecified: Secondary | ICD-10-CM | POA: Diagnosis not present

## 2018-05-19 LAB — PSA: PSA: 2.6 ng/mL (ref 0.10–4.00)

## 2018-05-19 LAB — HEPATIC FUNCTION PANEL
ALT: 21 U/L (ref 0–53)
AST: 19 U/L (ref 0–37)
Albumin: 4.2 g/dL (ref 3.5–5.2)
Alkaline Phosphatase: 59 U/L (ref 39–117)
BILIRUBIN DIRECT: 0.1 mg/dL (ref 0.0–0.3)
BILIRUBIN TOTAL: 0.4 mg/dL (ref 0.2–1.2)
Total Protein: 6.4 g/dL (ref 6.0–8.3)

## 2018-05-19 LAB — LIPID PANEL
CHOL/HDL RATIO: 5
Cholesterol: 215 mg/dL — ABNORMAL HIGH (ref 0–200)
HDL: 43.2 mg/dL (ref >39.00)
LDL Cholesterol: 146 mg/dL — ABNORMAL HIGH (ref 0–99)
NONHDL: 171.78
Triglycerides: 127 mg/dL (ref 0.0–149.0)
VLDL: 25.4 mg/dL (ref 0.0–40.0)

## 2018-05-19 LAB — BASIC METABOLIC PANEL
BUN: 13 mg/dL (ref 6–23)
CHLORIDE: 101 meq/L (ref 96–112)
CO2: 28 mEq/L (ref 19–32)
Calcium: 9.2 mg/dL (ref 8.4–10.5)
Creatinine, Ser: 0.89 mg/dL (ref 0.40–1.50)
GFR: 93.64 mL/min (ref >60.00)
GLUCOSE: 100 mg/dL — AB (ref 70–99)
POTASSIUM: 4.6 meq/L (ref 3.5–5.1)
SODIUM: 137 meq/L (ref 135–145)

## 2018-05-19 LAB — HEMOGLOBIN A1C: Hgb A1c MFr Bld: 6.1 % (ref 4.6–6.5)

## 2018-05-19 NOTE — Telephone Encounter (Signed)
Order ok'd for tramadol #30 with one refill.  To pick up at appt.

## 2018-05-19 NOTE — Progress Notes (Signed)
Subjective:    [name], male    DOB: May 14, 1961, 57 y.o.   MRN: [id number]  HPI  Patient here for his physical exam.  He reports he is doing relatively well.  Working a lot.  Does a lot of physical activity.  No chest pain.  No sob.  No acid reflux.  No abdominal pain.  Bowels moving.  No urine change.  States he previously noticed some pain under his belly button.  States feel like a needle in and out.  Lasted approximately two days.  Resolved.  Has not had any pain since.  No nausea or vomiting.  Desires no further testing.  Discussed persistent elevated blood pressure.  Discussed starting medication.  He declines.  He is planning to quit smoking on his birthday.     Past Medical History:  Diagnosis Date  . Chronic leg pain    s/p injury right leg (age 30)  . GERD (gastroesophageal reflux disease)   . History of ankle fracture    persistent pain  . Hypercholesterolemia   . Nephrolithiasis    followed by Edrick Oh  . Vitamin D deficiency    Past Surgical History:  Procedure Laterality Date  . ABDOMINAL SURGERY     after accident.  repaired spleen  . LEG SURGERY     Family History  Problem Relation Age of Onset  . Colon cancer Father   . Heart disease Father        has a pacemaker and defibrillator  . Hypertension Father   . Diabetes Father    Social History   Socioeconomic History  . Marital status: Married    Spouse name: Not on file  . Number of children: Not on file  . Years of education: Not on file  . Highest education level: Not on file  Occupational History  . Not on file  Social Needs  . Financial resource strain: Not on file  . Food insecurity:    Worry: Not on file    Inability: Not on file  . Transportation needs:    Medical: Not on file    Non-medical: Not on file  Tobacco Use  . Smoking status: Current Every Day Smoker  . 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 Alcohol use: No    Alcohol/week: 0.0  oz  . Drug use: No  . Sexual activity: Not on file  Lifestyle  . Physical activity:    Days per week: Not on file    Minutes per session: Not on file  . Stress: Not on file  Relationships  . Social connections:    Talks on phone: Not on file    Gets together: Not on file    Attends religious service: Not on file    Active member of club or organization: Not on file    Attends meetings of clubs or organizations: Not on file    Relationship status: Not on file  Other Topics Concern  . Not on file  Social History Narrative  . Not on file    Outpatient Encounter Medications as of 05/19/2018  Medication Sig  . cholecalciferol (VITAMIN D) 400 units TABS tablet Take 400 Units by mouth.  . cyclobenzaprine (FLEXERIL) 5 MG tablet Begin only at night time, as may cause drowsiness.  Marland Kitchen esomeprazole (NEXIUM) 40 MG capsule Take 20 mg by mouth daily.  Marland Kitchen lovastatin (MEVACOR) 40 MG tablet TAKE ONE TABLET BY MOUTH AT BEDTIME  .  traMADol (ULTRAM) 50 MG tablet TAKE  1 TABLET ONCE A DAY   Facility-Administered Encounter Medications as of 05/19/2018  Medication  . betamethasone acetate-betamethasone sodium phosphate (CELESTONE) injection 3 mg    Review of Systems  Constitutional: Negative for appetite change and unexpected weight change.  HENT: Negative for congestion and sinus pressure.   Eyes: Negative for pain and visual disturbance.  Respiratory: Negative for cough, chest tightness and shortness of breath.   Cardiovascular: Negative for chest pain, palpitations and leg swelling.  Gastrointestinal: Negative for abdominal pain, diarrhea, nausea and vomiting.  Genitourinary: Negative for difficulty urinating and dysuria.  Musculoskeletal: Negative for joint swelling and myalgias.  Skin: Negative for color change and rash.  Neurological: Negative for dizziness, light-headedness and headaches.  Hematological: Negative for adenopathy. Does not bruise/bleed easily.  Psychiatric/Behavioral:  Negative for agitation and dysphoric mood.       Objective:    Physical Exam  Constitutional: He is oriented to person, place, and time. He appears well-developed and well-nourished. No distress.  HENT:  Head: Normocephalic and atraumatic.  Nose: Nose normal.  Mouth/Throat: Oropharynx is clear and moist. No oropharyngeal exudate.  Eyes: Conjunctivae are normal. Right eye exhibits no discharge. Left eye exhibits no discharge.  Neck: Neck supple. No thyromegaly present.  Cardiovascular: Normal rate and regular rhythm.  Pulmonary/Chest: Breath sounds normal. No respiratory distress. He has no wheezes.  Abdominal: Soft. Bowel sounds are normal. There is no tenderness.  Genitourinary:  Genitourinary Comments: Not performed.   Musculoskeletal: He exhibits no edema or tenderness.  Lymphadenopathy:    He has no cervical adenopathy.  Neurological: He is alert and oriented to person, place, and time.  Skin: No rash noted. No erythema.  Psychiatric: He has a normal mood and affect. His behavior is normal.    BP (!) 146/98 (BP Location: Left Arm, Patient Position: Sitting, Cuff Size: Normal)   Pulse 72   Temp 97.6 F (36.4 C) (Oral)   Resp 18   Ht '5\' 5"'$  (1.651 m)   Wt 167 lb 3.2 oz (75.8 kg)   SpO2 98%   BMI 27.82 kg/m  Wt Readings from Last 3 Encounters:  05/19/18 167 lb 3.2 oz (75.8 kg)  01/06/18 161 lb 3.2 oz (73.1 kg)  11/04/17 155 lb 9.6 oz (70.6 kg)     Lab Results  Component Value Date   WBC 7.1 09/09/2017   HGB 15.6 09/09/2017   HCT 47.1 09/09/2017   PLT 270.0 09/09/2017   GLUCOSE 100 (H) 05/19/2018   CHOL 215 (H) 05/19/2018   TRIG 127.0 05/19/2018   HDL 43.20 05/19/2018   LDLDIRECT 210.3 11/30/2013   LDLCALC 146 (H) 05/19/2018   ALT 21 05/19/2018   AST 19 05/19/2018   NA 137 05/19/2018   K 4.6 05/19/2018   CL 101 05/19/2018   CREATININE 0.89 05/19/2018   BUN 13 05/19/2018   CO2 28 05/19/2018   TSH 2.63 09/09/2017   PSA 2.60 05/19/2018   HGBA1C 6.1  05/19/2018       Assessment & Plan:   Problem List Items Addressed This Visit    Esophageal reflux    Controlled on nexium.  Follow.       Health care maintenance    Physical today 05/19/18.  colononscopy 02/28/15.  Recommended f/u colonoscopy in 5 years.  Check psa.        Hyperglycemia - Primary    Low carb diet and exercise.  Follow met b and a1c.  Relevant Orders   Hemoglobin A1c (Completed)   Hypertension    Blood pressure elevated.  Discussed with him today.  Discussed the need for medication.  He declines.  Discussed importance of treating his blood pressure.  He declines.  Follow.        Relevant Orders   Basic metabolic panel (Completed)   Pure hypercholesterolemia    On lovastatin.  Low cholesterol diet and exercise.  Follow lipid panel and liver function tests.        Relevant Orders   Lipid panel (Completed)   Hepatic function panel (Completed)   Tobacco use disorder    Have discussed the need to quit smoking.  He plans to quit on his birthday.  Follow.        Vitamin D deficiency, unspecified    Follow vitamin D level.        Other Visit Diagnoses    Prostate cancer screening       Relevant Orders   PSA (Completed)   Routine general medical examination at a health care facility           Einar Pheasant, MD

## 2018-05-19 NOTE — Assessment & Plan Note (Signed)
Physical today 05/19/18.  colononscopy 02/28/15.  Recommended f/u colonoscopy in 5 years.  Check psa.

## 2018-05-22 ENCOUNTER — Encounter: Payer: Self-pay | Admitting: Internal Medicine

## 2018-05-22 NOTE — Assessment & Plan Note (Signed)
Have discussed the need to quit smoking.  He plans to quit on his birthday.  Follow.

## 2018-05-22 NOTE — Assessment & Plan Note (Signed)
Blood pressure elevated.  Discussed with him today.  Discussed the need for medication.  He declines.  Discussed importance of treating his blood pressure.  He declines.  Follow.

## 2018-05-22 NOTE — Assessment & Plan Note (Signed)
Follow vitamin D level.  

## 2018-05-22 NOTE — Assessment & Plan Note (Signed)
Low carb diet and exercise.  Follow met b and a1c.   

## 2018-05-22 NOTE — Assessment & Plan Note (Signed)
On lovastatin.  Low cholesterol diet and exercise.  Follow lipid panel and liver function tests.   

## 2018-05-22 NOTE — Assessment & Plan Note (Signed)
Controlled on nexium.  Follow.  

## 2018-08-14 ENCOUNTER — Other Ambulatory Visit: Payer: Self-pay | Admitting: Internal Medicine

## 2018-08-14 DIAGNOSIS — M79604 Pain in right leg: Principal | ICD-10-CM

## 2018-08-14 DIAGNOSIS — G8929 Other chronic pain: Secondary | ICD-10-CM

## 2018-08-15 NOTE — Telephone Encounter (Signed)
Last OV 05/19/18  Next OV 09/15/18  Last refill 05/19/18   Patient has 3 or 4 pills left

## 2018-08-18 ENCOUNTER — Other Ambulatory Visit: Payer: Self-pay | Admitting: Internal Medicine

## 2018-09-08 DIAGNOSIS — M47812 Spondylosis without myelopathy or radiculopathy, cervical region: Secondary | ICD-10-CM | POA: Insufficient documentation

## 2018-09-15 ENCOUNTER — Ambulatory Visit: Payer: 59 | Admitting: Internal Medicine

## 2018-09-15 ENCOUNTER — Encounter: Payer: Self-pay | Admitting: Internal Medicine

## 2018-09-15 VITALS — BP 152/98 | HR 62 | Temp 97.5°F | Resp 18 | Ht 65.0 in | Wt 163.0 lb

## 2018-09-15 DIAGNOSIS — Z23 Encounter for immunization: Secondary | ICD-10-CM

## 2018-09-15 DIAGNOSIS — E7800 Pure hypercholesterolemia, unspecified: Secondary | ICD-10-CM | POA: Diagnosis not present

## 2018-09-15 DIAGNOSIS — K219 Gastro-esophageal reflux disease without esophagitis: Secondary | ICD-10-CM

## 2018-09-15 DIAGNOSIS — I10 Essential (primary) hypertension: Secondary | ICD-10-CM

## 2018-09-15 DIAGNOSIS — R739 Hyperglycemia, unspecified: Secondary | ICD-10-CM | POA: Diagnosis not present

## 2018-09-15 DIAGNOSIS — F17200 Nicotine dependence, unspecified, uncomplicated: Secondary | ICD-10-CM

## 2018-09-15 DIAGNOSIS — E559 Vitamin D deficiency, unspecified: Secondary | ICD-10-CM

## 2018-09-15 DIAGNOSIS — M79602 Pain in left arm: Secondary | ICD-10-CM

## 2018-09-15 LAB — HEPATIC FUNCTION PANEL
ALK PHOS: 62 U/L (ref 39–117)
ALT: 21 U/L (ref 0–53)
AST: 20 U/L (ref 0–37)
Albumin: 4.4 g/dL (ref 3.5–5.2)
BILIRUBIN DIRECT: 0.1 mg/dL (ref 0.0–0.3)
TOTAL PROTEIN: 7 g/dL (ref 6.0–8.3)
Total Bilirubin: 0.4 mg/dL (ref 0.2–1.2)

## 2018-09-15 LAB — CBC WITH DIFFERENTIAL/PLATELET
BASOS ABS: 0 10*3/uL (ref 0.0–0.1)
Basophils Relative: 0.3 % (ref 0.0–3.0)
EOS PCT: 1.2 % (ref 0.0–5.0)
Eosinophils Absolute: 0.1 10*3/uL (ref 0.0–0.7)
HEMATOCRIT: 46.8 % (ref 39.0–52.0)
HEMOGLOBIN: 15.7 g/dL (ref 13.0–17.0)
LYMPHS PCT: 24.4 % (ref 12.0–46.0)
Lymphs Abs: 1.6 10*3/uL (ref 0.7–4.0)
MCHC: 33.5 g/dL (ref 30.0–36.0)
MCV: 90.4 fl (ref 78.0–100.0)
MONOS PCT: 8.2 % (ref 3.0–12.0)
Monocytes Absolute: 0.5 10*3/uL (ref 0.1–1.0)
Neutro Abs: 4.4 10*3/uL (ref 1.4–7.7)
Neutrophils Relative %: 65.9 % (ref 43.0–77.0)
Platelets: 263 10*3/uL (ref 150.0–400.0)
RBC: 5.18 Mil/uL (ref 4.22–5.81)
RDW: 13.7 % (ref 11.5–15.5)
WBC: 6.7 10*3/uL (ref 4.0–10.5)

## 2018-09-15 LAB — LIPID PANEL
CHOL/HDL RATIO: 4
Cholesterol: 175 mg/dL (ref 0–200)
HDL: 44.9 mg/dL (ref >39.00)
LDL Cholesterol: 113 mg/dL — ABNORMAL HIGH (ref 0–99)
NONHDL: 130.2
Triglycerides: 85 mg/dL (ref 0.0–149.0)
VLDL: 17 mg/dL (ref 0.0–40.0)

## 2018-09-15 LAB — BASIC METABOLIC PANEL
BUN: 18 mg/dL (ref 6–23)
CHLORIDE: 106 meq/L (ref 96–112)
CO2: 29 meq/L (ref 19–32)
CREATININE: 0.82 mg/dL (ref 0.40–1.50)
Calcium: 9.3 mg/dL (ref 8.4–10.5)
GFR: 102.81 mL/min (ref >60.00)
GLUCOSE: 91 mg/dL (ref 70–99)
Potassium: 4.5 mEq/L (ref 3.5–5.1)
Sodium: 140 mEq/L (ref 135–145)

## 2018-09-15 LAB — VITAMIN D 25 HYDROXY (VIT D DEFICIENCY, FRACTURES): VITD: 42.41 ng/mL (ref 30.00–100.00)

## 2018-09-15 LAB — TSH: TSH: 1.9 u[IU]/mL (ref 0.35–4.50)

## 2018-09-15 LAB — HEMOGLOBIN A1C: Hgb A1c MFr Bld: 6.2 % (ref 4.6–6.5)

## 2018-09-15 MED ORDER — AMLODIPINE BESYLATE 5 MG PO TABS: 5.0000 mg | ORAL_TABLET | Freq: Every day | ORAL | 2 refills | Status: DC

## 2018-09-15 NOTE — Progress Notes (Signed)
[name], male   DOB: 01-08-1961, 57 y.o.   MRN: [id number]   Subjective:    [name], male    DOB: 06-14-1961, 57 y.o.   MRN: [id number]  HPI  Patient here for a scheduled follow up.  He recently saw rheumatology 09/08/18 for f/u cervical spondylosis - left arm pain and f/u right ankle pain.  Note reviewed.  Declines left shoulder injection.  Takes tramadol prn.  No changes made.  Tries to stay active.  Plays golf.  No chest pain.  No sob.  No acid reflux.  No abdominal pain.  Bowels moving.  Still smoking. Discussed with him today.  He states he tried to quit, but was unsuccessful.  Discussed treatment options.  Declines to quit now.  Discussed elevated blood pressure.  Was intolerant to lisinopril.     Past Medical History:  Diagnosis Date  . Chronic leg pain    s/p injury right leg (age 6)  . GERD (gastroesophageal reflux disease)   . History of ankle fracture    persistent pain  . Hypercholesterolemia   . Nephrolithiasis    followed by Edrick Oh  . Vitamin D deficiency    Past Surgical History:  Procedure Laterality Date  . ABDOMINAL SURGERY     after accident.  repaired spleen  . LEG SURGERY     Family History  Problem Relation Age of Onset  . Colon cancer Father   . Heart disease Father        has a pacemaker and defibrillator  . Hypertension Father   . Diabetes Father    Social History   Socioeconomic History  . Marital status: Married    Spouse name: Not on file  . Number of children: Not on file  . Years of education: Not on file  . Highest education level: Not on file  Occupational History  . Not on file  Social Needs  . Financial resource strain: Not on file  . Food insecurity:    Worry: Not on file    Inability: Not on file  . Transportation needs:    Medical: Not on file    Non-medical: Not on file  Tobacco Use  . Smoking status: Current Every Day Smoker  . Smokeless tobacco: Former Network engineer and Sexual  Activity  . Alcohol use: No    Alcohol/week: 0.0 standard drinks  . Drug use: No  . Sexual activity: Not on file  Lifestyle  . Physical activity:    Days per week: Not on file    Minutes per session: Not on file  . Stress: Not on file  Relationships  . Social connections:    Talks on phone: Not on file    Gets together: Not on file    Attends religious service: Not on file    Active member of club or organization: Not on file    Attends meetings of clubs or organizations: Not on file    Relationship status: Not on file  Other Topics Concern  . Not on file  Social History Narrative  . Not on file    Outpatient Encounter Medications as of 09/15/2018  Medication Sig  . cholecalciferol (VITAMIN D) 400 units TABS tablet Take 400 Units by mouth.  . cyclobenzaprine (FLEXERIL) 5 MG tablet Begin only at night time, as may cause drowsiness.  Marland Kitchen esomeprazole (NEXIUM) 40 MG capsule Take 20 mg by mouth daily.  Marland Kitchen lovastatin (MEVACOR) 40 MG tablet  TAKE ONE TABLET BY MOUTH AT BEDTIME  . traMADol (ULTRAM) 50 MG tablet TAKE 1 TABLET ONCE A DAY  . amLODipine (NORVASC) 5 MG tablet Take 1 tablet (5 mg total) by mouth daily.   Facility-Administered Encounter Medications as of 09/15/2018  Medication  . betamethasone acetate-betamethasone sodium phosphate (CELESTONE) injection 3 mg    Review of Systems  Constitutional: Negative for appetite change and unexpected weight change.  HENT: Negative for congestion and sinus pressure.   Respiratory: Negative for cough, chest tightness and shortness of breath.   Cardiovascular: Negative for chest pain, palpitations and leg swelling.  Gastrointestinal: Negative for abdominal pain, diarrhea and nausea.  Genitourinary: Negative for difficulty urinating and dysuria.  Musculoskeletal: Negative for joint swelling and myalgias.       Left upper arm pain - as outlined.    Skin: Negative for color change and rash.  Neurological: Negative for dizziness,  light-headedness and headaches.  Psychiatric/Behavioral: Negative for agitation and dysphoric mood.       Objective:    Physical Exam  Constitutional: He appears well-developed and well-nourished. No distress.  HENT:  Nose: Nose normal.  Mouth/Throat: Oropharynx is clear and moist.  Neck: Neck supple. No thyromegaly present.  Cardiovascular: Normal rate and regular rhythm.  Pulmonary/Chest: Effort normal and breath sounds normal. No respiratory distress.  Abdominal: Soft. Bowel sounds are normal. There is no tenderness.  Musculoskeletal: He exhibits no edema or tenderness.  Lymphadenopathy:    He has no cervical adenopathy.  Skin: No rash noted. No erythema.  Psychiatric: He has a normal mood and affect. His behavior is normal.    BP (!) 152/98 (BP Location: Left Arm, Patient Position: Sitting, Cuff Size: Normal)   Pulse 62   Temp (!) 97.5 F (36.4 C) (Oral)   Resp 18   Ht _0  (1.651 m)   Wt 163 lb (73.9 kg)   SpO2 98%   BMI 27.12 kg/m  Wt Readings from Last 3 Encounters:  09/15/18 163 lb (73.9 kg)  05/19/18 167 lb 3.2 oz (75.8 kg)  01/06/18 161 lb 3.2 oz (73.1 kg)     Lab Results  Component Value Date   WBC 6.7 09/15/2018   HGB 15.7 09/15/2018   HCT 46.8 09/15/2018   PLT 263.0 09/15/2018   GLUCOSE 91 09/15/2018   CHOL 175 09/15/2018   TRIG 85.0 09/15/2018   HDL 44.90 09/15/2018   LDLDIRECT 210.3 11/30/2013   LDLCALC 113 (H) 09/15/2018   ALT 21 09/15/2018   AST 20 09/15/2018   NA 140 09/15/2018   K 4.5 09/15/2018   CL 106 09/15/2018   CREATININE 0.82 09/15/2018   BUN 18 09/15/2018   CO2 29 09/15/2018   TSH 1.90 09/15/2018   PSA 2.60 05/19/2018   HGBA1C 6.2 09/15/2018       Assessment & Plan:   Problem List Items Addressed This Visit    Esophageal reflux    Controlled on nexium.       Hyperglycemia    Low carb diet and exercise.  Check met b and a1c.        Relevant Orders   Hemoglobin A1c (Completed)   Hypertension - Primary    Blood  pressure elevated.  Has had intolerance to lisinopril.  Has been hesitant to try another medication.  Discussed with him today importance of treating his blood pressure.  Start amlodipine 73m q day.  Follow pressures.  Get him back in soon to reassess.  Check metabolic panel.  Relevant Medications   amLODipine (NORVASC) 5 MG tablet   Other Relevant Orders   CBC with Differential/Platelet (Completed)   TSH (Completed)   Basic metabolic panel (Completed)   Left arm pain    Just saw rheumatology in f/u.  Possible bursitis vs cervical spondylosis.  Declined shoulder injection.  Takes tramadol prn.  Follow.        Pure hypercholesterolemia    On lovastatin.  Low cholesterol diet and exercise.  Follow lipid panel and liver function tests.        Relevant Medications   amLODipine (NORVASC) 5 MG tablet   Other Relevant Orders   Hepatic function panel (Completed)   Lipid panel (Completed)   Tobacco use disorder    Discussed the need to quit smoking.  He declines.        Vitamin D deficiency, unspecified    Follow vitamin D level.        Relevant Orders   VITAMIN D 25 Hydroxy (Vit-D Deficiency, Fractures) (Completed)    Other Visit Diagnoses    Need for immunization against influenza       Relevant Orders   Flu Vaccine QUAD 36+ mos IM (Completed)       Einar Pheasant, MD

## 2018-09-18 ENCOUNTER — Other Ambulatory Visit: Payer: Self-pay | Admitting: Internal Medicine

## 2018-09-18 ENCOUNTER — Encounter: Payer: Self-pay | Admitting: Internal Medicine

## 2018-09-18 DIAGNOSIS — G8929 Other chronic pain: Secondary | ICD-10-CM

## 2018-09-18 DIAGNOSIS — M79604 Pain in right leg: Principal | ICD-10-CM

## 2018-09-18 NOTE — Assessment & Plan Note (Signed)
Blood pressure elevated.  Has had intolerance to lisinopril.  Has been hesitant to try another medication.  Discussed with him today importance of treating his blood pressure.  Start amlodipine 5mg  q day.  Follow pressures.  Get him back in soon to reassess.  Check metabolic panel.

## 2018-09-18 NOTE — Telephone Encounter (Signed)
Refilled: 08/15/2018 Last OV: 09/15/2018 Next OV: 11/10/2018

## 2018-09-18 NOTE — Assessment & Plan Note (Signed)
Low carb diet and exercise.  Check met b and a1c.   

## 2018-09-18 NOTE — Assessment & Plan Note (Signed)
On lovastatin.  Low cholesterol diet and exercise.  Follow lipid panel and liver function tests.   

## 2018-09-18 NOTE — Assessment & Plan Note (Signed)
Discussed the need to quit smoking.  He declines.

## 2018-09-18 NOTE — Assessment & Plan Note (Signed)
Follow vitamin D level.  

## 2018-09-18 NOTE — Assessment & Plan Note (Signed)
Just saw rheumatology in f/u.  Possible bursitis vs cervical spondylosis.  Declined shoulder injection.  Takes tramadol prn.  Follow.

## 2018-09-18 NOTE — Assessment & Plan Note (Signed)
Controlled on nexium.  

## 2018-09-19 NOTE — Telephone Encounter (Signed)
ok'd for #30 with one refill (tramadol). rx printed.

## 2018-09-20 NOTE — Telephone Encounter (Signed)
faxed

## 2018-10-30 ENCOUNTER — Other Ambulatory Visit: Payer: Self-pay | Admitting: Internal Medicine

## 2018-11-04 ENCOUNTER — Emergency Department
Admission: EM | Admit: 2018-11-04 | Discharge: 2018-11-04 | Disposition: A | Discharge status: Home / Self Care | Payer: PRIVATE HEALTH INSURANCE | Attending: Emergency Medicine | Admitting: Emergency Medicine

## 2018-11-04 ENCOUNTER — Encounter: Payer: Self-pay | Admitting: Emergency Medicine

## 2018-11-04 ENCOUNTER — Emergency Department: Payer: PRIVATE HEALTH INSURANCE

## 2018-11-04 ENCOUNTER — Other Ambulatory Visit: Payer: Self-pay

## 2018-11-04 DIAGNOSIS — R079 Chest pain, unspecified: Secondary | ICD-10-CM

## 2018-11-04 DIAGNOSIS — Z79899 Other long term (current) drug therapy: Secondary | ICD-10-CM | POA: Insufficient documentation

## 2018-11-04 DIAGNOSIS — E7800 Pure hypercholesterolemia, unspecified: Secondary | ICD-10-CM | POA: Insufficient documentation

## 2018-11-04 DIAGNOSIS — F17210 Nicotine dependence, cigarettes, uncomplicated: Secondary | ICD-10-CM | POA: Insufficient documentation

## 2018-11-04 HISTORY — DX: Essential (primary) hypertension: I10

## 2018-11-04 LAB — CBC
HCT: 50.3 % (ref 39.0–52.0)
Hemoglobin: 16.5 g/dL (ref 13.0–17.0)
MCH: 29.9 pg (ref 26.0–34.0)
MCHC: 32.8 g/dL (ref 30.0–36.0)
MCV: 91.1 fL (ref 80.0–100.0)
NRBC: 0 % (ref 0.0–0.2)
PLATELETS: 342 10*3/uL (ref 150–400)
RBC: 5.52 MIL/uL (ref 4.22–5.81)
RDW: 13.3 % (ref 11.5–15.5)
WBC: 16.3 10*3/uL — AB (ref 4.0–10.5)

## 2018-11-04 LAB — COMPREHENSIVE METABOLIC PANEL
ALBUMIN: 4.5 g/dL (ref 3.5–5.0)
ALT: 33 U/L (ref 0–44)
ANION GAP: 10 (ref 5–15)
AST: 27 U/L (ref 15–41)
Alkaline Phosphatase: 69 U/L (ref 38–126)
BILIRUBIN TOTAL: 0.9 mg/dL (ref 0.3–1.2)
BUN: 15 mg/dL (ref 6–20)
CO2: 28 mmol/L (ref 22–32)
Calcium: 9.5 mg/dL (ref 8.9–10.3)
Chloride: 100 mmol/L (ref 98–111)
Creatinine, Ser: 0.87 mg/dL (ref 0.61–1.24)
GFR calc Af Amer: >60 mL/min (ref >60)
GFR calc non Af Amer: >60 mL/min (ref >60)
GLUCOSE: 126 mg/dL — AB (ref 70–99)
Potassium: 4 mmol/L (ref 3.5–5.1)
SODIUM: 138 mmol/L (ref 135–145)
TOTAL PROTEIN: 8 g/dL (ref 6.5–8.1)

## 2018-11-04 LAB — TROPONIN I: Troponin I: <0.03 ng/mL (ref <0.03)

## 2018-11-04 LAB — LIPASE, BLOOD: Lipase: 24 U/L (ref 11–51)

## 2018-11-04 MED ORDER — IOHEXOL 350 MG/ML SOLN
75.0000 mL | Freq: Once | INTRAVENOUS | Status: AC | PRN
  Administered 2018-11-04: 75 mL via INTRAVENOUS

## 2018-11-04 MED ORDER — KETOROLAC TROMETHAMINE 30 MG/ML IJ SOLN
30.0000 mg | Freq: Once | INTRAMUSCULAR | Status: AC
  Administered 2018-11-04: 30 mg via INTRAVENOUS
  Filled 2018-11-04: qty 1

## 2018-11-04 NOTE — ED Provider Notes (Addendum)
[facility] Emergency Department Provider Note  ____________________________________________   I have reviewed the triage vital signs and the nursing notes. Where available I have reviewed prior notes and, if possible and indicated, outside hospital notes.    HISTORY  Chief Complaint Chest Pain    HPI FIELDS OROS is a 57 y.o. male  Who presents today complaining of pain in his sternal region.  He states he was doing well as yesterday.  He has no shortness of breath.  No cough.  He states that he thought maybe it was reflux so he tried his reflux medications that did not help.  Is no abdominal pain no vomiting, no referral of the pain.  Is a sharp constant discomfort, it seems to be worse when he changes position or touches it otherwise nothing makes it worse or better it is not exertional, it is not pleuritic.  He has no personal family history of PE or DVT, and he has not had pain like this before.  No recent travel no history of leg swelling.  History of mild hypertension he states and he has a history of tobacco abuse for the last 30 years or more   Past Medical History:  Diagnosis Date  . Chronic leg pain    s/p injury right leg (age 66)  . GERD (gastroesophageal reflux disease)   . History of ankle fracture    persistent pain  . Hypercholesterolemia   . Nephrolithiasis    followed by Assunta Gambles  . Vitamin D deficiency     Patient Active Problem List   Diagnosis Date Noted  . Hand pain, left 09/10/2017  . Right foot pain 09/09/2017  . Dermatitis due to plants, including poison ivy, sumac, and oak 05/30/2017  . Hypertension 10/24/2016  . Left arm pain 09/12/2016  . Hyperglycemia 05/07/2016  . Headache 12/07/2015  . Health care maintenance 07/07/2015  . Tobacco use disorder 03/25/2014  . Family history of colon cancer 03/25/2013  . Kidney stones 03/25/2013  . Vitamin D deficiency, unspecified 03/25/2013  . Pure hypercholesterolemia  03/25/2013  . Pain in soft tissues of limb 03/25/2013  . Esophageal reflux 03/25/2013  . Family history of malignant neoplasm of gastrointestinal tract 03/25/2013    Past Surgical History:  Procedure Laterality Date  . ABDOMINAL SURGERY     after accident.  repaired spleen  . LEG SURGERY      Prior to Admission medications   Medication Sig Start Date End Date Taking? Authorizing Provider  amLODipine (NORVASC) 5 MG tablet Take 1 tablet (5 mg total) by mouth daily. 09/15/18   Dale Oberlin, MD  cholecalciferol (VITAMIN D) 400 units TABS tablet Take 400 Units by mouth.    [provider]  cyclobenzaprine (FLEXERIL) 5 MG tablet Begin only at night time, as may cause drowsiness. 10/21/17   [provider]  esomeprazole (NEXIUM) 40 MG capsule Take 20 mg by mouth daily. 11/16/13   Dale Berryville, MD  lovastatin (MEVACOR) 40 MG tablet TAKE ONE TABLET BY MOUTH AT BEDTIME 10/30/18   Dale Morris, MD  traMADol (ULTRAM) 50 MG tablet TAKE 1 TABLET BY MOUTH ONCE A DAY 09/19/18   Dale Pocahontas, MD    Allergies Lisinopril; Fish oil; and Simvastatin  Family History  Problem Relation Age of Onset  . Colon cancer Father   . Heart disease Father        has a pacemaker and defibrillator  . Hypertension Father   . Diabetes Father  Social History Social History   Tobacco Use  . Smoking status: Current Every Day Smoker    Packs/day: 1.00    Types: Cigarettes  . Smokeless tobacco: Former Engineer, water Use Topics  . Alcohol use: No    Alcohol/week: 0.0 standard drinks  . Drug use: No    Review of Systems Constitutional: No fever/chills Eyes: No visual changes. ENT: No sore throat. No stiff neck no neck pain Cardiovascular: Denies chest pain. Respiratory: Denies shortness of breath. Gastrointestinal:   no vomiting.  No diarrhea.  No constipation. Genitourinary: Negative for dysuria. Musculoskeletal: Negative lower extremity swelling Skin: Negative for  rash. Neurological: Negative for severe headaches, focal weakness or numbness.   ____________________________________________   PHYSICAL EXAM:  VITAL SIGNS: ED Triage Vitals  Enc Vitals Group     BP 11/04/18 1540 (!) 145/92     Pulse Rate 11/04/18 1540 93     Resp 11/04/18 1540 16     Temp 11/04/18 1540 98 F (36.7 C)     Temp Source 11/04/18 1540 Oral     SpO2 11/04/18 1540 97 %     Weight 11/04/18 1540 165 lb (74.8 kg)     Height 11/04/18 1540 5\' 6"  (1.676 m)     Head Circumference --      Peak Flow --      Pain Score 11/04/18 1544 7     Pain Loc --      Pain Edu? --      Excl. in GC? --     Constitutional: Alert and oriented. Well appearing and in no acute distress. Eyes: Conjunctivae are normal Head: Atraumatic HEENT: No congestion/rhinnorhea. Mucous membranes are moist.  Oropharynx non-erythematous Neck:   Nontender with no meningismus, no masses, no stridor Cardiovascular: Normal rate, regular rhythm. Grossly normal heart sounds.  Good peripheral circulation. Chest: There is some tenderness to palpation at the insertion of the pectoralis muscle on the right side into the chest wall, at the sternal border, this reproduces his pain which is severe patient states "ouch that is the pain right there" and pulls back.  No crepitus no flail chest. Respiratory: Normal respiratory effort.  No retractions. Lungs CTAB. Abdominal: Soft and nontender. No distention. No guarding no rebound Back:  There is no focal tenderness or step off.  there is no midline tenderness there are no lesions noted. there is no CVA tenderness Musculoskeletal: No lower extremity tenderness, no upper extremity tenderness. No joint effusions, no DVT signs strong distal pulses no edema Neurologic:  Normal speech and language. No gross focal neurologic deficits are appreciated.  Skin:  Skin is warm, dry and intact. No rash noted. Psychiatric: Mood and affect are normal. Speech and behavior are  normal.  ____________________________________________   LABS (all labs ordered are listed, but only abnormal results are displayed)  Labs Reviewed  CBC - Abnormal; Notable for the following components:      Result Value   WBC 16.3 (*)    All other components within normal limits  COMPREHENSIVE METABOLIC PANEL - Abnormal; Notable for the following components:   Glucose, Bld 126 (*)    All other components within normal limits  TROPONIN I  TROPONIN I  LIPASE, BLOOD    Pertinent labs  results that were available during my care of the patient were reviewed by me and considered in my medical decision making (see chart for details). ____________________________________________  EKG  I personally interpreted any EKGs ordered by me or triage Sinus  rhythm rate 91 bpm no acute ST elevation or depression, normal axis unremarkable EKG ____________________________________________  RADIOLOGY  Pertinent labs & imaging results that were available during my care of the patient were reviewed by me and considered in my medical decision making (see chart for details). If possible, patient and/or family made aware of any abnormal findings.  Dg Chest 2 View  Result Date: 11/04/2018 CLINICAL DATA:  Chest pain, chest pressure. EXAM: CHEST - 2 VIEW COMPARISON:  Chest x-ray dated 02/07/2017. FINDINGS: Heart size and mediastinal contours are within normal limits. Small platelike opacity within the LEFT perihilar lung, better appreciated on the lateral projection, atelectasis versus pneumonia. RIGHT lung is clear. No pleural effusion or pneumothorax bilaterally. Mild chronic appearing vertebral body wedging at multiple levels of the thoracic spine. No acute or suspicious osseous finding. IMPRESSION: Subtle platelike opacity within the LEFT perihilar lung, atelectasis versus pneumonia, favor early developing pneumonia. Recommend follow-up chest x-ray to ensure resolution. Electronically Signed   By: Bary Richard M.D.   On: 11/04/2018 16:15   ____________________________________________    PROCEDURES  Procedure(s) performed: None  Procedures  Critical Care performed: None  ____________________________________________   INITIAL IMPRESSION / ASSESSMENT AND PLAN / ED COURSE  Pertinent labs & imaging results that were available during my care of the patient were reviewed by me and considered in my medical decision making (see chart for details).  With nonstop reproducible chest wall pain since this morning, low suspicion for ACS PE or dissection however, he does have a change on his chest x-ray which is of concern given that he does not have any symptoms of pneumonia, and he is a 30-pack-year smoker, we will obtain CT scan of his chest to rule out oncologic or embolic pathology.  Also send a second set of cardiac markers.  I have low suspicion in this patient for ACS or dissection. ----------------------------------------- 7:45 PM on 11/04/2018 ----------------------------------------- Patient has no complaints, pain-free after Toradol, we did talk about admission versus discharge and he is adamant that he would prefer to go home.  He understands that there is some risk to going home but he would prefer to go.  He understands that he does need to follow closely with outpatient cardiology as a precaution and he understands my customary return precautions and follow-up instructions.  Patient has reproducible chest wall pain is been constant all day long until he received pain medications, there is no indication of an EKG or blood work or CT scan of acute intrathoracic pathology or referred abdominal pain at this time, there does not appear to be clinical evidence to support the diagnosis of pulmonary embolus, dissection, myocarditis, endocarditis, pericarditis, pericardial tamponade, acute coronary syndrome, pneumothorax, pneumonia, or any other acute intrathoracic pathology that will require  admission or acute intervention. Nor is there evidence of any significant intra-abdominal pathology causing this discomfort.  Given that he declines admission we will discharge him but I have stressed the need to return cardiology, as well as to this department if he feels worse.  Patient voices understanding.    ____________________________________________   FINAL CLINICAL IMPRESSION(S) / ED DIAGNOSES  Final diagnoses:  None      This chart was dictated using voice recognition software.  Despite best efforts to proofread,  errors can occur which can change meaning.      Jeanmarie Plant, MD 11/04/18 1801    Jeanmarie Plant, MD 11/04/18 1946

## 2018-11-04 NOTE — Discharge Instructions (Addendum)
We are reassured by her work-up, we do advised that you stop smoking, we further advised that you return to the emergency room should you have any new or worrisome symptoms.  You prefer to go home then be admitted which is certainly not unreasonable, but does limit our ability to watch you and in exchange we do expect you to be vigilant about your own health.  This would include returning to the emergency room for chest pain shortness of breath, change in your chest pain, if you get unexpectedly sweaty, if you have trouble tolerating exercise that you normally do, or if you have any other concerns.  Please follow-up with a referral cardiologist first thing on Monday, and your primary care.

## 2018-11-04 NOTE — ED Triage Notes (Signed)
Awoke this am with mid chest pain. Continues. Denies sob.

## 2018-11-04 NOTE — ED Notes (Signed)
Patient transported to CT 

## 2018-11-10 ENCOUNTER — Encounter: Payer: Self-pay | Admitting: Internal Medicine

## 2018-11-10 ENCOUNTER — Ambulatory Visit (INDEPENDENT_AMBULATORY_CARE_PROVIDER_SITE_OTHER): Payer: 59 | Admitting: Internal Medicine

## 2018-11-10 VITALS — BP 142/90 | HR 75 | Temp 97.8°F | Resp 15 | Wt 161.0 lb

## 2018-11-10 DIAGNOSIS — R079 Chest pain, unspecified: Secondary | ICD-10-CM

## 2018-11-10 DIAGNOSIS — L989 Disorder of the skin and subcutaneous tissue, unspecified: Secondary | ICD-10-CM

## 2018-11-10 DIAGNOSIS — R739 Hyperglycemia, unspecified: Secondary | ICD-10-CM

## 2018-11-10 DIAGNOSIS — K219 Gastro-esophageal reflux disease without esophagitis: Secondary | ICD-10-CM | POA: Diagnosis not present

## 2018-11-10 DIAGNOSIS — I10 Essential (primary) hypertension: Secondary | ICD-10-CM

## 2018-11-10 DIAGNOSIS — E559 Vitamin D deficiency, unspecified: Secondary | ICD-10-CM

## 2018-11-10 DIAGNOSIS — E7800 Pure hypercholesterolemia, unspecified: Secondary | ICD-10-CM

## 2018-11-10 DIAGNOSIS — F17200 Nicotine dependence, unspecified, uncomplicated: Secondary | ICD-10-CM

## 2018-11-10 MED ORDER — AMLODIPINE BESYLATE 10 MG PO TABS: 10.0000 mg | ORAL_TABLET | Freq: Every day | ORAL | 2 refills | Status: DC

## 2018-11-10 NOTE — Patient Instructions (Signed)
Increase amlodipine to 10mg per day 

## 2018-11-10 NOTE — Progress Notes (Signed)
[name], male   DOB: [birth year], 57 y.o.   MRN: [id number]   Subjective:    [name], male    DOB: 04-Jan-1961, 57 y.o.   MRN: [id number]  HPI  Patient here for a scheduled follow up.  Was seen in ER last week.  Was seen for evaluation of chest pain.  States he was doing a lot of digging 1-2 days prior to the onset of pain.  Took medication for indigestion and gas x and did not help. In ER labs unrevealing.  CT chest unrevealing.  Given Toradol.  Reported pain free after toradol.  He states since his discharge, he has not had any more pain like he felt prior to his visit.  No chest pain.  No sob.  No abdominal pain.  Bowels moving.  States he feels good.     Past Medical History:  Diagnosis Date  . Chronic leg pain    s/p injury right leg (age 81)  . GERD (gastroesophageal reflux disease)   . History of ankle fracture    persistent pain  . Hypercholesterolemia   . Hypertension   . Nephrolithiasis    followed by Edrick Oh  . Vitamin D deficiency    Past Surgical History:  Procedure Laterality Date  . ABDOMINAL SURGERY     after accident.  repaired spleen  . LEG SURGERY     Family History  Problem Relation Age of Onset  . Colon cancer Father   . Heart disease Father        has a pacemaker and defibrillator  . Hypertension Father   . Diabetes Father    Social History   Socioeconomic History  . Marital status: Married    Spouse name: Not on file  . Number of children: Not on file  . Years of education: Not on file  . Highest education level: Not on file  Occupational History  . Not on file  Social Needs  . Financial resource strain: Not on file  . Food insecurity:    Worry: Not on file    Inability: Not on file  . Transportation needs:    Medical: Not on file    Non-medical: Not on file  Tobacco Use  . Smoking status: Current Every Day Smoker    Packs/day: 1.00    Types: Cigarettes  . 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 Alcohol use: No    Alcohol/week: 0.0 standard drinks  . Drug use: No  . Sexual activity: Not on file  Lifestyle  . Physical activity:    Days per week: Not on file    Minutes per session: Not on file  . Stress: Not on file  Relationships  . Social connections:    Talks on phone: Not on file    Gets together: Not on file    Attends religious service: Not on file    Active member of club or organization: Not on file    Attends meetings of clubs or organizations: Not on file    Relationship status: Not on file  Other Topics Concern  . Not on file  Social History Narrative  . Not on file    Outpatient Encounter Medications as of 11/10/2018  Medication Sig  . amLODipine (NORVASC) 10 MG tablet Take 1 tablet (10 mg total) by mouth daily.  . cholecalciferol (VITAMIN D) 400 units TABS tablet Take 400 Units by mouth.  Marland Kitchen  cyclobenzaprine (FLEXERIL) 5 MG tablet Begin only at night time, as may cause drowsiness.  Marland Kitchen esomeprazole (NEXIUM) 40 MG capsule Take 20 mg by mouth daily.  Marland Kitchen lovastatin (MEVACOR) 40 MG tablet TAKE ONE TABLET BY MOUTH AT BEDTIME  . traMADol (ULTRAM) 50 MG tablet TAKE 1 TABLET BY MOUTH ONCE A DAY  . [DISCONTINUED] amLODipine (NORVASC) 5 MG tablet Take 1 tablet (5 mg total) by mouth daily.   Facility-Administered Encounter Medications as of 11/10/2018  Medication  . betamethasone acetate-betamethasone sodium phosphate (CELESTONE) injection 3 mg    Review of Systems  Constitutional: Negative for appetite change and unexpected weight change.  HENT: Negative for congestion and sinus pressure.   Respiratory: Negative for cough, chest tightness and shortness of breath.   Cardiovascular: Negative for chest pain, palpitations and leg swelling.  Gastrointestinal: Negative for abdominal pain, diarrhea, nausea and vomiting.  Genitourinary: Negative for difficulty urinating and dysuria.  Musculoskeletal: Negative for joint swelling and  myalgias.  Skin: Negative for color change and rash.  Neurological: Negative for dizziness, light-headedness and headaches.  Psychiatric/Behavioral: Negative for agitation and dysphoric mood.       Objective:    Physical Exam  Constitutional: He appears well-developed and well-nourished. No distress.  HENT:  Nose: Nose normal.  Mouth/Throat: Oropharynx is clear and moist.  Neck: Neck supple.  Cardiovascular: Normal rate and regular rhythm.  Pulmonary/Chest: Effort normal and breath sounds normal. No respiratory distress.  Abdominal: Soft. Bowel sounds are normal. There is no tenderness.  Musculoskeletal: He exhibits no edema or tenderness.  Lymphadenopathy:    He has no cervical adenopathy.  Skin: No rash noted. No erythema.  Psychiatric: He has a normal mood and affect. His behavior is normal.    BP (!) 142/90 (BP Location: Left Arm, Patient Position: Sitting, Cuff Size: Large)   Pulse 75   Temp 97.8 F (36.6 C) (Oral)   Resp 15   Wt 161 lb (73 kg)   SpO2 98%   BMI 25.99 kg/m  Wt Readings from Last 3 Encounters:  11/10/18 161 lb (73 kg)  11/04/18 165 lb (74.8 kg)  09/15/18 163 lb (73.9 kg)     Lab Results  Component Value Date   WBC 16.3 (H) 11/04/2018   HGB 16.5 11/04/2018   HCT 50.3 11/04/2018   PLT 342 11/04/2018   GLUCOSE 126 (H) 11/04/2018   CHOL 175 09/15/2018   TRIG 85.0 09/15/2018   HDL 44.90 09/15/2018   LDLDIRECT 210.3 11/30/2013   LDLCALC 113 (H) 09/15/2018   ALT 33 11/04/2018   AST 27 11/04/2018   NA 138 11/04/2018   K 4.0 11/04/2018   CL 100 11/04/2018   CREATININE 0.87 11/04/2018   BUN 15 11/04/2018   CO2 28 11/04/2018   TSH 1.90 09/15/2018   PSA 2.60 05/19/2018   HGBA1C 6.2 09/15/2018    Dg Chest 2 View  Result Date: 11/04/2018 CLINICAL DATA:  Chest pain, chest pressure. EXAM: CHEST - 2 VIEW COMPARISON:  Chest x-ray dated 02/07/2017. FINDINGS: Heart size and mediastinal contours are within normal limits. Small platelike opacity  within the LEFT perihilar lung, better appreciated on the lateral projection, atelectasis versus pneumonia. RIGHT lung is clear. No pleural effusion or pneumothorax bilaterally. Mild chronic appearing vertebral body wedging at multiple levels of the thoracic spine. No acute or suspicious osseous finding. IMPRESSION: Subtle platelike opacity within the LEFT perihilar lung, atelectasis versus pneumonia, favor early developing pneumonia. Recommend follow-up chest x-ray to ensure resolution. Electronically Signed  By: Franki Cabot M.D.   On: 11/04/2018 16:15   Ct Angio Chest Pe W And/or Wo Contrast  Result Date: 11/04/2018 CLINICAL DATA:  Chest pain EXAM: CT ANGIOGRAPHY CHEST WITH CONTRAST TECHNIQUE: Multidetector CT imaging of the chest was performed using the standard protocol during bolus administration of intravenous contrast. Multiplanar CT image reconstructions and MIPs were obtained to evaluate the vascular anatomy. CONTRAST:  60m OMNIPAQUE IOHEXOL 350 MG/ML SOLN COMPARISON:  None. FINDINGS: Cardiovascular: --Pulmonary arteries: Contrast injection is sufficient to demonstrate satisfactory opacification of the pulmonary arteries to the segmental level. There is no pulmonary embolus. The main pulmonary artery is within normal limits for size. --Aorta: Satisfactory opacification of the thoracic aorta. No aortic dissection or other acute aortic syndrome. Conventional 3 vessel aortic branching pattern. The aortic course and caliber are normal. There is no aortic atherosclerosis. --Heart: Normal size. No pericardial effusion. Mediastinum/Nodes: No mediastinal, hilar or axillary lymphadenopathy. The visualized thyroid and thoracic esophageal course are unremarkable. Lungs/Pleura: No pulmonary nodules or masses. No pleural effusion or pneumothorax. No focal airspace consolidation. No focal pleural abnormality. Upper Abdomen: Contrast bolus timing is not optimized for evaluation of the abdominal organs. Within this  limitation, the visualized organs of the upper abdomen are normal. Musculoskeletal: No chest wall abnormality. No acute or significant osseous findings. Review of the MIP images confirms the above findings. IMPRESSION: No pulmonary embolus or other acute thoracic abnormality. Electronically Signed   By: KUlyses JarredM.D.   On: 11/04/2018 18:32       Assessment & Plan:   Problem List Items Addressed This Visit    Chest pain    Chest pain as outlined. None since ER visit. He feels more msk in origin.  Did a lot of digging prior to the pain.  Discussed the need for cardiology referral.  He declines.  Wants to monitor.        Esophageal reflux    Controlled on nexium.        Hyperglycemia    Low carb diet and exercise.  Follow met b and a1c.        Hypertension    Persistent elevation.  Increase amlodipine to '10mg'$  q day.  Follow pressures.  Follow metabolic panel.        Relevant Medications   amLODipine (NORVASC) 10 MG tablet   Pure hypercholesterolemia    On lovastatin.  Low cholesterol diet and exercise.  Follow lipid panel and liver function tests.        Relevant Medications   amLODipine (NORVASC) 10 MG tablet   Tobacco use disorder    Discussed with him today.  He has set a quit date - 12/26/18.  Plans to stop smoking.  Follow.        Vitamin D deficiency, unspecified    Follow vitamin D level.         Other Visit Diagnoses    Skin lesion    -  Primary   skin lesion of ear.  neck nodule.  refer to dermatology.     Relevant Orders   Ambulatory referral to Dermatology       CEinar Pheasant MD

## 2018-11-12 ENCOUNTER — Encounter: Payer: Self-pay | Admitting: Internal Medicine

## 2018-11-12 DIAGNOSIS — R079 Chest pain, unspecified: Secondary | ICD-10-CM | POA: Insufficient documentation

## 2018-11-12 NOTE — Assessment & Plan Note (Signed)
Chest pain as outlined. None since ER visit. He feels more msk in origin.  Did a lot of digging prior to the pain.  Discussed the need for cardiology referral.  He declines.  Wants to monitor.

## 2018-11-12 NOTE — Assessment & Plan Note (Signed)
Controlled on nexium.  

## 2018-11-12 NOTE — Assessment & Plan Note (Signed)
Discussed with him today.  He has set a quit date - 12/26/18.  Plans to stop smoking.  Follow.

## 2018-11-12 NOTE — Assessment & Plan Note (Signed)
On lovastatin.  Low cholesterol diet and exercise.  Follow lipid panel and liver function tests.   

## 2018-11-12 NOTE — Assessment & Plan Note (Signed)
Follow vitamin D level.  

## 2018-11-12 NOTE — Assessment & Plan Note (Signed)
Persistent elevation.  Increase amlodipine to 10mg  q day.  Follow pressures.  Follow metabolic panel.

## 2018-11-12 NOTE — Assessment & Plan Note (Signed)
Low carb diet and exercise.  Follow met b and a1c.   

## 2019-01-01 ENCOUNTER — Other Ambulatory Visit: Payer: Self-pay | Admitting: Internal Medicine

## 2019-01-01 MED ORDER — LOVASTATIN 40 MG PO TABS: 40.0000 mg | ORAL_TABLET | Freq: Every day | ORAL | 5 refills | Status: DC

## 2019-01-01 NOTE — Telephone Encounter (Signed)
Copied from CRM [id number]. Topic: Quick Communication - Rx Refill/Question >> Jan 01, 2019  9:32 AM Gaynelle Adu wrote: Medication: lovastatin (MEVACOR) 40 MG tablet  Has the patient contacted their pharmacy?yes   Preferred Pharmacy (with phone number or street name): [facility] - [place], [place] - [address] [phone] (Phone) [phone] (Fax)    Agent: Please be advised that RX refills may take up to 3 business days. We ask that you follow-up with your pharmacy.

## 2019-01-05 ENCOUNTER — Ambulatory Visit: Payer: 59 | Admitting: Internal Medicine

## 2019-01-05 ENCOUNTER — Encounter: Payer: Self-pay | Admitting: Internal Medicine

## 2019-01-05 VITALS — BP 122/76 | HR 86 | Temp 97.5°F | Resp 16 | Wt 159.4 lb

## 2019-01-05 DIAGNOSIS — E559 Vitamin D deficiency, unspecified: Secondary | ICD-10-CM

## 2019-01-05 DIAGNOSIS — I10 Essential (primary) hypertension: Secondary | ICD-10-CM | POA: Diagnosis not present

## 2019-01-05 DIAGNOSIS — D72829 Elevated white blood cell count, unspecified: Secondary | ICD-10-CM

## 2019-01-05 DIAGNOSIS — R739 Hyperglycemia, unspecified: Secondary | ICD-10-CM | POA: Diagnosis not present

## 2019-01-05 DIAGNOSIS — F17200 Nicotine dependence, unspecified, uncomplicated: Secondary | ICD-10-CM

## 2019-01-05 DIAGNOSIS — E7800 Pure hypercholesterolemia, unspecified: Secondary | ICD-10-CM

## 2019-01-05 NOTE — Progress Notes (Signed)
[name], male   DOB: Mar 27, 1961, 58 y.o.   MRN: [id number]   Subjective:    [name], male    DOB: [birth year], 58 y.o.   MRN: [id number]  HPI  Patient here for a scheduled follow up.  Here to follow up regarding his blood pressure.  Taking his medication.  Blood pressure better.  No further chest pain. No sob.  No acid reflux.  No abdominal pain.  Bowels moving.  No urine change. Had cyst on his back.  Applied heat, etc.  Cyst drained.  Better now.  Planning to see dermatology for skin lesions and neck nodule.  Discussed quitting smoking.    Past Medical History:  Diagnosis Date  . Chronic leg pain    s/p injury right leg (age 83)  . GERD (gastroesophageal reflux disease)   . History of ankle fracture    persistent pain  . Hypercholesterolemia   . Hypertension   . Nephrolithiasis    followed by Edrick Oh  . Vitamin D deficiency    Past Surgical History:  Procedure Laterality Date  . ABDOMINAL SURGERY     after accident.  repaired spleen  . LEG SURGERY     Family History  Problem Relation Age of Onset  . Colon cancer Father   . Heart disease Father        has a pacemaker and defibrillator  . Hypertension Father   . Diabetes Father    Social History   Socioeconomic History  . Marital status: Married    Spouse name: Not on file  . Number of children: Not on file  . Years of education: Not on file  . Highest education level: Not on file  Occupational History  . Not on file  Social Needs  . Financial resource strain: Not on file  . Food insecurity:    Worry: Not on file    Inability: Not on file  . Transportation needs:    Medical: Not on file    Non-medical: Not on file  Tobacco Use  . Smoking status: Current Every Day Smoker    Packs/day: 1.00    Types: Cigarettes  . 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 Alcohol use: No    Alcohol/week: 0.0 standard drinks  . Drug use: No  . Sexual activity:  Not on file  Lifestyle  . Physical activity:    Days per week: Not on file    Minutes per session: Not on file  . Stress: Not on file  Relationships  . Social connections:    Talks on phone: Not on file    Gets together: Not on file    Attends religious service: Not on file    Active member of club or organization: Not on file    Attends meetings of clubs or organizations: Not on file    Relationship status: Not on file  Other Topics Concern  . Not on file  Social History Narrative  . Not on file    Outpatient Encounter Medications as of 01/05/2019  Medication Sig  . amLODipine (NORVASC) 10 MG tablet Take 1 tablet (10 mg total) by mouth daily.  . cholecalciferol (VITAMIN D) 400 units TABS tablet Take 400 Units by mouth.  . cyclobenzaprine (FLEXERIL) 5 MG tablet Begin only at night time, as may cause drowsiness.  Marland Kitchen esomeprazole (NEXIUM) 40 MG capsule Take 20 mg by mouth daily.  Marland Kitchen lovastatin (MEVACOR) 40 MG  tablet Take 1 tablet (40 mg total) by mouth at bedtime.  . traMADol (ULTRAM) 50 MG tablet TAKE 1 TABLET BY MOUTH ONCE A DAY   Facility-Administered Encounter Medications as of 01/05/2019  Medication  . betamethasone acetate-betamethasone sodium phosphate (CELESTONE) injection 3 mg    Review of Systems  Constitutional: Negative for appetite change and unexpected weight change.  HENT: Negative for congestion and sinus pressure.   Respiratory: Negative for cough, chest tightness and shortness of breath.   Cardiovascular: Negative for chest pain, palpitations and leg swelling.  Gastrointestinal: Negative for abdominal pain, diarrhea, nausea and vomiting.  Genitourinary: Negative for difficulty urinating and dysuria.  Musculoskeletal: Negative for joint swelling and myalgias.  Skin: Negative for color change and rash.  Neurological: Negative for dizziness and headaches.  Psychiatric/Behavioral: Negative for agitation and dysphoric mood.       Objective:    Physical  Exam Constitutional:      General: He is not in acute distress.    Appearance: Normal appearance. He is well-developed.  HENT:     Nose: Nose normal. No congestion.     Mouth/Throat:     Pharynx: No oropharyngeal exudate or posterior oropharyngeal erythema.  Cardiovascular:     Rate and Rhythm: Normal rate and regular rhythm.  Pulmonary:     Effort: Pulmonary effort is normal. No respiratory distress.     Breath sounds: Normal breath sounds.  Abdominal:     General: Bowel sounds are normal.     Palpations: Abdomen is soft.     Tenderness: There is no abdominal tenderness.  Musculoskeletal:        General: No swelling or tenderness.  Skin:    Findings: No erythema or rash.  Neurological:     Mental Status: He is alert.  Psychiatric:        Mood and Affect: Mood normal.        Behavior: Behavior normal.     BP 122/76 (BP Location: Left Arm, Patient Position: Sitting, Cuff Size: Normal)   Pulse 86   Temp (!) 97.5 F (36.4 C) (Oral)   Resp 16   Wt 159 lb 6.4 oz (72.3 kg)   SpO2 96%   BMI 25.73 kg/m  Wt Readings from Last 3 Encounters:  01/05/19 159 lb 6.4 oz (72.3 kg)  11/10/18 161 lb (73 kg)  11/04/18 165 lb (74.8 kg)     Lab Results  Component Value Date   WBC 16.3 (H) 11/04/2018   HGB 16.5 11/04/2018   HCT 50.3 11/04/2018   PLT 342 11/04/2018   GLUCOSE 126 (H) 11/04/2018   CHOL 175 09/15/2018   TRIG 85.0 09/15/2018   HDL 44.90 09/15/2018   LDLDIRECT 210.3 11/30/2013   LDLCALC 113 (H) 09/15/2018   ALT 33 11/04/2018   AST 27 11/04/2018   NA 138 11/04/2018   K 4.0 11/04/2018   CL 100 11/04/2018   CREATININE 0.87 11/04/2018   BUN 15 11/04/2018   CO2 28 11/04/2018   TSH 1.90 09/15/2018   PSA 2.60 05/19/2018   HGBA1C 6.2 09/15/2018    Dg Chest 2 View  Result Date: 11/04/2018 CLINICAL DATA:  Chest pain, chest pressure. EXAM: CHEST - 2 VIEW COMPARISON:  Chest x-ray dated 02/07/2017. FINDINGS: Heart size and mediastinal contours are within normal limits.  Small platelike opacity within the LEFT perihilar lung, better appreciated on the lateral projection, atelectasis versus pneumonia. RIGHT lung is clear. No pleural effusion or pneumothorax bilaterally. Mild chronic appearing vertebral body wedging at  multiple levels of the thoracic spine. No acute or suspicious osseous finding. IMPRESSION: Subtle platelike opacity within the LEFT perihilar lung, atelectasis versus pneumonia, favor early developing pneumonia. Recommend follow-up chest x-ray to ensure resolution. Electronically Signed   By: Franki Cabot M.D.   On: 11/04/2018 16:15   Ct Angio Chest Pe W And/or Wo Contrast  Result Date: 11/04/2018 CLINICAL DATA:  Chest pain EXAM: CT ANGIOGRAPHY CHEST WITH CONTRAST TECHNIQUE: Multidetector CT imaging of the chest was performed using the standard protocol during bolus administration of intravenous contrast. Multiplanar CT image reconstructions and MIPs were obtained to evaluate the vascular anatomy. CONTRAST:  56m OMNIPAQUE IOHEXOL 350 MG/ML SOLN COMPARISON:  None. FINDINGS: Cardiovascular: --Pulmonary arteries: Contrast injection is sufficient to demonstrate satisfactory opacification of the pulmonary arteries to the segmental level. There is no pulmonary embolus. The main pulmonary artery is within normal limits for size. --Aorta: Satisfactory opacification of the thoracic aorta. No aortic dissection or other acute aortic syndrome. Conventional 3 vessel aortic branching pattern. The aortic course and caliber are normal. There is no aortic atherosclerosis. --Heart: Normal size. No pericardial effusion. Mediastinum/Nodes: No mediastinal, hilar or axillary lymphadenopathy. The visualized thyroid and thoracic esophageal course are unremarkable. Lungs/Pleura: No pulmonary nodules or masses. No pleural effusion or pneumothorax. No focal airspace consolidation. No focal pleural abnormality. Upper Abdomen: Contrast bolus timing is not optimized for evaluation of the  abdominal organs. Within this limitation, the visualized organs of the upper abdomen are normal. Musculoskeletal: No chest wall abnormality. No acute or significant osseous findings. Review of the MIP images confirms the above findings. IMPRESSION: No pulmonary embolus or other acute thoracic abnormality. Electronically Signed   By: KUlyses JarredM.D.   On: 11/04/2018 18:32       Assessment & Plan:   Problem List Items Addressed This Visit    Hyperglycemia    Low carb diet and exercise.  Follow met b and a1c.        Relevant Orders   Hemoglobin A1c   Hypertension    Blood pressure under good control.  Continue same medication regimen.  Follow pressures.  Follow metabolic panel.        Relevant Orders   Basic metabolic panel   Pure hypercholesterolemia    On lovastatin.  Low cholesterol diet and exercise.  Follow lipid panel and liver function tests.        Relevant Orders   Hepatic function panel   Lipid panel   Tobacco use disorder    Discussed with him today. Discussed the need to stop smoking.        Vitamin D deficiency, unspecified    Follow vitamin D level.         Other Visit Diagnoses    Leukocytosis, unspecified type    -  Primary   Relevant Orders   CBC with Differential/Platelet       CEinar Pheasant MD

## 2019-01-07 ENCOUNTER — Encounter: Payer: Self-pay | Admitting: Internal Medicine

## 2019-01-07 NOTE — Assessment & Plan Note (Signed)
Blood pressure under good control.  Continue same medication regimen.  Follow pressures.  Follow metabolic panel.   

## 2019-01-07 NOTE — Assessment & Plan Note (Signed)
On lovastatin.  Low cholesterol diet and exercise.  Follow lipid panel and liver function tests.   

## 2019-01-07 NOTE — Assessment & Plan Note (Signed)
Low carb diet and exercise.  Follow met b and a1c.   

## 2019-01-07 NOTE — Assessment & Plan Note (Signed)
Follow vitamin D level.  

## 2019-01-07 NOTE — Assessment & Plan Note (Signed)
Discussed with him today. Discussed the need to stop smoking.

## 2019-02-23 ENCOUNTER — Other Ambulatory Visit: Payer: 59

## 2019-02-26 ENCOUNTER — Other Ambulatory Visit: Payer: Self-pay | Admitting: Internal Medicine

## 2019-03-02 ENCOUNTER — Other Ambulatory Visit (INDEPENDENT_AMBULATORY_CARE_PROVIDER_SITE_OTHER): Payer: PRIVATE HEALTH INSURANCE

## 2019-03-02 DIAGNOSIS — E7800 Pure hypercholesterolemia, unspecified: Secondary | ICD-10-CM

## 2019-03-02 DIAGNOSIS — R739 Hyperglycemia, unspecified: Secondary | ICD-10-CM | POA: Diagnosis not present

## 2019-03-02 DIAGNOSIS — D72829 Elevated white blood cell count, unspecified: Secondary | ICD-10-CM

## 2019-03-02 DIAGNOSIS — I10 Essential (primary) hypertension: Secondary | ICD-10-CM | POA: Diagnosis not present

## 2019-03-02 LAB — BASIC METABOLIC PANEL
BUN: 15 mg/dL (ref 6–23)
CO2: 29 mEq/L (ref 19–32)
Calcium: 9.4 mg/dL (ref 8.4–10.5)
Chloride: 101 mEq/L (ref 96–112)
Creatinine, Ser: 0.96 mg/dL (ref 0.40–1.50)
GFR: 80.51 mL/min (ref >60.00)
Glucose, Bld: 126 mg/dL — ABNORMAL HIGH (ref 70–99)
Potassium: 4.6 mEq/L (ref 3.5–5.1)
Sodium: 137 mEq/L (ref 135–145)

## 2019-03-02 LAB — CBC WITH DIFFERENTIAL/PLATELET
Basophils Absolute: 0.1 10*3/uL (ref 0.0–0.1)
Basophils Relative: 0.8 % (ref 0.0–3.0)
Eosinophils Absolute: 0.1 10*3/uL (ref 0.0–0.7)
Eosinophils Relative: 1.2 % (ref 0.0–5.0)
HCT: 44.2 % (ref 39.0–52.0)
Hemoglobin: 14.9 g/dL (ref 13.0–17.0)
Lymphocytes Relative: 22.2 % (ref 12.0–46.0)
Lymphs Abs: 1.8 10*3/uL (ref 0.7–4.0)
MCHC: 33.7 g/dL (ref 30.0–36.0)
MCV: 90.7 fl (ref 78.0–100.0)
Monocytes Absolute: 0.5 10*3/uL (ref 0.1–1.0)
Monocytes Relative: 6.8 % (ref 3.0–12.0)
Neutro Abs: 5.5 10*3/uL (ref 1.4–7.7)
Neutrophils Relative %: 69 % (ref 43.0–77.0)
Platelets: 301 10*3/uL (ref 150.0–400.0)
RBC: 4.88 Mil/uL (ref 4.22–5.81)
RDW: 13.8 % (ref 11.5–15.5)
WBC: 7.9 10*3/uL (ref 4.0–10.5)

## 2019-03-02 LAB — LIPID PANEL
Cholesterol: 188 mg/dL (ref 0–200)
HDL: 46.1 mg/dL (ref >39.00)
LDL Cholesterol: 124 mg/dL — ABNORMAL HIGH (ref 0–99)
NONHDL: 141.65
Total CHOL/HDL Ratio: 4
Triglycerides: 88 mg/dL (ref 0.0–149.0)
VLDL: 17.6 mg/dL (ref 0.0–40.0)

## 2019-03-02 LAB — HEMOGLOBIN A1C: Hgb A1c MFr Bld: 6 % (ref 4.6–6.5)

## 2019-03-02 LAB — HEPATIC FUNCTION PANEL
ALT: 20 U/L (ref 0–53)
AST: 19 U/L (ref 0–37)
Albumin: 4.5 g/dL (ref 3.5–5.2)
Alkaline Phosphatase: 73 U/L (ref 39–117)
Bilirubin, Direct: 0.1 mg/dL (ref 0.0–0.3)
Total Bilirubin: 0.5 mg/dL (ref 0.2–1.2)
Total Protein: 6.7 g/dL (ref 6.0–8.3)

## 2019-05-11 ENCOUNTER — Ambulatory Visit (INDEPENDENT_AMBULATORY_CARE_PROVIDER_SITE_OTHER): Payer: PRIVATE HEALTH INSURANCE | Admitting: Internal Medicine

## 2019-05-11 ENCOUNTER — Other Ambulatory Visit: Payer: Self-pay

## 2019-05-11 ENCOUNTER — Encounter: Payer: Self-pay | Admitting: Internal Medicine

## 2019-05-11 DIAGNOSIS — I10 Essential (primary) hypertension: Secondary | ICD-10-CM

## 2019-05-11 DIAGNOSIS — K219 Gastro-esophageal reflux disease without esophagitis: Secondary | ICD-10-CM | POA: Diagnosis not present

## 2019-05-11 DIAGNOSIS — E7800 Pure hypercholesterolemia, unspecified: Secondary | ICD-10-CM

## 2019-05-11 DIAGNOSIS — R739 Hyperglycemia, unspecified: Secondary | ICD-10-CM

## 2019-05-11 DIAGNOSIS — F17200 Nicotine dependence, unspecified, uncomplicated: Secondary | ICD-10-CM

## 2019-05-11 NOTE — Progress Notes (Signed)
[name], male   DOB: 07/03/1961, 58 y.o.   MRN: [id number]   Virtual Visit via video Note  This visit type was conducted due to national recommendations for restrictions regarding the COVID-19 pandemic (e.g. social distancing).  This format is felt to be most appropriate for this patient at this time.  All issues noted in this document were discussed and addressed.  No physical exam was performed (except for noted visual exam findings with Video Visits).   I connected with Jones Bales by a video enabled telemedicine application and verified that I am speaking with the correct person using two identifiers. Location patient: home Location provider: work Persons participating in the virtual visit: patient, provider  I discussed the limitations, risks, security and privacy concerns of performing an evaluation and management service by video and the availability of in person appointments.The patient expressed understanding and agreed to proceed.   Reason for visit: scheduled follow up.   HPI: He reports he is doing relatively well.  Increased stress related to his wife's death, but overall he feels he is handling things relatively well.  Does not feel needs any further intervention.  Trying to stay active.  No chest pain.  No sob.  No acid reflux.  No abdominal pain.  Bowels moving.  He is still working, but states they are practicing social distancing.  No fever.  No chest congestion, cough or sob.  Seeing dermatology..  Planning for removal of neck lesion next week.  States his blood pressure has been doing well.  Discussed the need for him to stop smoking.  He declines to quit.  Will notify me when agreeable.     ROS: See pertinent positives and negatives per HPI.  Past Medical History:  Diagnosis Date  . Chronic leg pain    s/p injury right leg (age 66)  . GERD (gastroesophageal reflux disease)   . History of ankle fracture    persistent pain  . Hypercholesterolemia    . Hypertension   . Nephrolithiasis    followed by Edrick Oh  . Vitamin D deficiency     Past Surgical History:  Procedure Laterality Date  . ABDOMINAL SURGERY     after accident.  repaired spleen  . LEG SURGERY      Family History  Problem Relation Age of Onset  . Colon cancer Father   . Heart disease Father        has a pacemaker and defibrillator  . Hypertension Father   . Diabetes Father     SOCIAL HX: reviewed.    Current Outpatient Medications:  .  amLODipine (NORVASC) 10 MG tablet, TAKE 1 TABLET BY MOUTH ONCE A DAY, Disp: 30 tablet, Rfl: 2 .  cholecalciferol (VITAMIN D) 400 units TABS tablet, Take 400 Units by mouth., Disp: , Rfl:  .  cyclobenzaprine (FLEXERIL) 5 MG tablet, Begin only at night time, as may cause drowsiness., Disp: , Rfl:  .  esomeprazole (NEXIUM) 40 MG capsule, Take 20 mg by mouth daily., Disp: , Rfl:  .  lovastatin (MEVACOR) 40 MG tablet, Take 1 tablet (40 mg total) by mouth at bedtime., Disp: 30 tablet, Rfl: 5 .  traMADol (ULTRAM) 50 MG tablet, TAKE 1 TABLET BY MOUTH ONCE A DAY, Disp: 30 tablet, Rfl: 1  Current Facility-Administered Medications:  .  betamethasone acetate-betamethasone sodium phosphate (CELESTONE) injection 3 mg, 3 mg, Intramuscular, Once, Evans, Dorathy Daft, DPM  EXAM:  VITALS:  137/80  GENERAL: alert, oriented, appears well  and in no acute distress  HEENT: atraumatic, conjunttiva clear, no obvious abnormalities on inspection of external nose and ears  NECK: normal movements of the head and neck  LUNGS: on inspection no signs of respiratory distress, breathing rate appears normal, no obvious gross SOB, gasping or wheezing  CV: no obvious cyanosis  PSYCH/NEURO: pleasant and cooperative, no obvious depression or anxiety, speech and thought processing grossly intact  ASSESSMENT AND PLAN:  Discussed the following assessment and plan:  Gastroesophageal reflux disease, esophagitis presence not specified  Hyperglycemia   Essential hypertension  Pure hypercholesterolemia  Tobacco use disorder  Esophageal reflux Doing well on current regimen.  Follow.   Hyperglycemia Low carb diet and exercise.  Follow met b and a1c.   Hypertension Blood pressure he reports is doing well.  Continue current medication.  Follow pressures.  Follow metabolic panel.   Pure hypercholesterolemia On lovastatin.  Low cholesterol diet and exercise.  Follow lipid panel and liver function tests.    Tobacco use disorder Discussed with him today.  Discussed the need to stop smoking.  He declines to stop.  Will notify me when agreeable.      I discussed the assessment and treatment plan with the patient. The patient was provided an opportunity to ask questions and all were answered. The patient agreed with the plan and demonstrated an understanding of the instructions.   The patient was advised to call back or seek an in-person evaluation if the symptoms worsen or if the condition fails to improve as anticipated.  I provided 15 minutes of non-face-to-face time during this encounter.   Einar Pheasant, MD

## 2019-05-13 ENCOUNTER — Encounter: Payer: Self-pay | Admitting: Internal Medicine

## 2019-05-13 NOTE — Assessment & Plan Note (Signed)
Doing well on current regimen.  Follow.   

## 2019-05-13 NOTE — Assessment & Plan Note (Signed)
Low carb diet and exercise.  Follow met b and a1c.  

## 2019-05-13 NOTE — Assessment & Plan Note (Signed)
On lovastatin.  Low cholesterol diet and exercise.  Follow lipid panel and liver function tests.   

## 2019-05-13 NOTE — Assessment & Plan Note (Signed)
Blood pressure he reports is doing well.  Continue current medication.  Follow pressures.  Follow metabolic panel.

## 2019-05-13 NOTE — Assessment & Plan Note (Signed)
Discussed with him today.  Discussed the need to stop smoking.  He declines to stop.  Will notify me when agreeable.

## 2019-05-23 ENCOUNTER — Other Ambulatory Visit: Payer: Self-pay | Admitting: Internal Medicine

## 2019-06-22 ENCOUNTER — Other Ambulatory Visit: Payer: Self-pay | Admitting: Internal Medicine

## 2019-07-23 ENCOUNTER — Other Ambulatory Visit: Payer: Self-pay | Admitting: Internal Medicine

## 2019-09-20 IMAGING — CR DG CHEST 2V
2 series · 2 of 2 positions shown · non-contrast
Comparison: Chest x-ray dated 02/07/2017.

CLINICAL DATA: Chest pain, chest pressure.

EXAM:
CHEST - 2 VIEW

[chest pa]
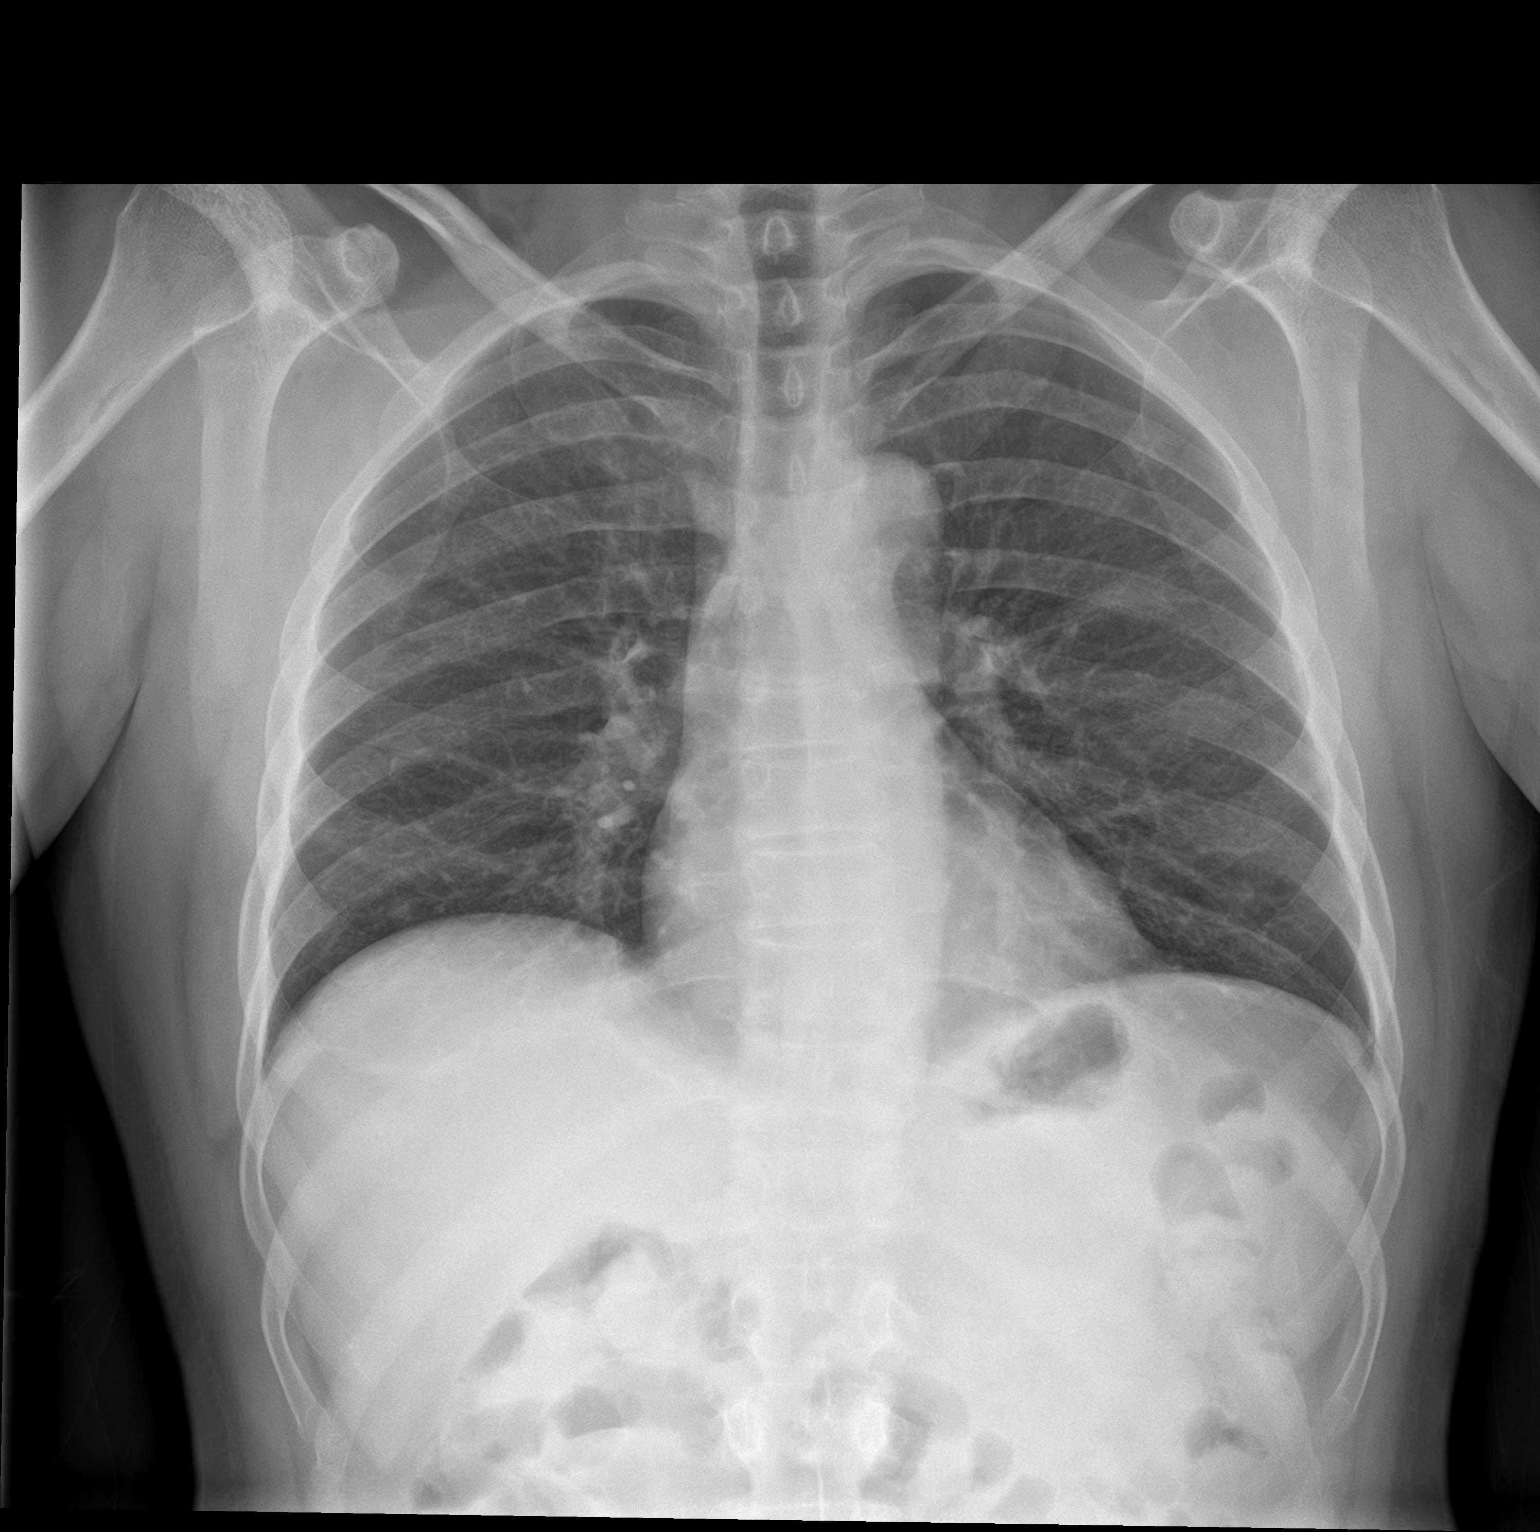

[chest lat]
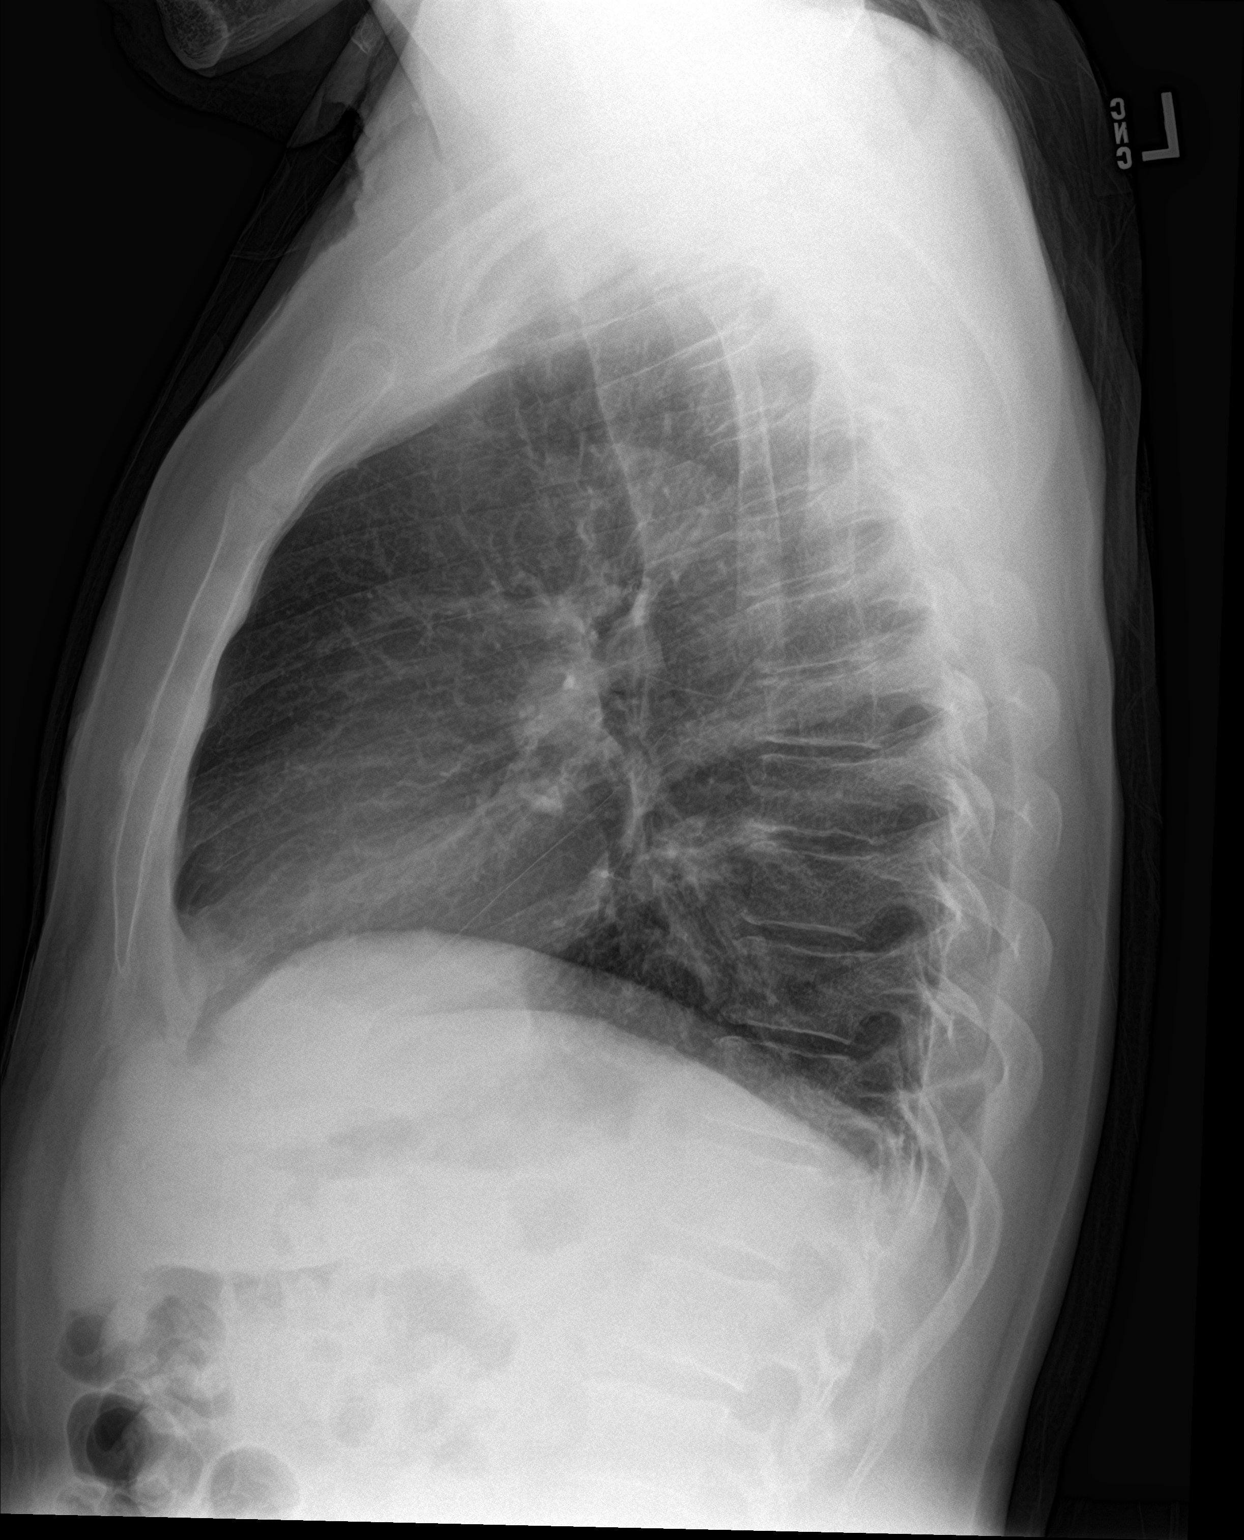

[2 of 2 positions shown; findings below may reference images not displayed]

FINDINGS: Heart size and mediastinal contours are within normal limits. Small
platelike opacity within the LEFT perihilar lung, better appreciated
on the lateral projection, atelectasis versus pneumonia. RIGHT lung
is clear. No pleural effusion or pneumothorax bilaterally.

Mild chronic appearing vertebral body wedging at multiple levels of
the thoracic spine. No acute or suspicious osseous finding.
IMPRESSION: Subtle platelike opacity within the LEFT perihilar lung, atelectasis
versus pneumonia, favor early developing pneumonia. Recommend
follow-up chest x-ray to ensure resolution.

## 2019-09-27 ENCOUNTER — Ambulatory Visit: Payer: PRIVATE HEALTH INSURANCE | Admitting: Internal Medicine

## 2019-10-17 ENCOUNTER — Other Ambulatory Visit: Payer: Self-pay

## 2019-10-19 ENCOUNTER — Ambulatory Visit: Payer: BC Managed Care – PPO | Admitting: Internal Medicine

## 2019-10-19 ENCOUNTER — Encounter: Payer: Self-pay | Admitting: Internal Medicine

## 2019-10-19 ENCOUNTER — Other Ambulatory Visit: Payer: Self-pay

## 2019-10-19 ENCOUNTER — Other Ambulatory Visit: Payer: Self-pay | Admitting: Internal Medicine

## 2019-10-19 VITALS — BP 128/70 | HR 71 | Temp 98.3°F | Resp 16 | Wt 143.4 lb

## 2019-10-19 DIAGNOSIS — Z23 Encounter for immunization: Secondary | ICD-10-CM | POA: Diagnosis not present

## 2019-10-19 DIAGNOSIS — I10 Essential (primary) hypertension: Secondary | ICD-10-CM | POA: Diagnosis not present

## 2019-10-19 DIAGNOSIS — Z800 Family history of malignant neoplasm of digestive organs: Secondary | ICD-10-CM | POA: Diagnosis not present

## 2019-10-19 DIAGNOSIS — R634 Abnormal weight loss: Secondary | ICD-10-CM

## 2019-10-19 DIAGNOSIS — M79604 Pain in right leg: Secondary | ICD-10-CM

## 2019-10-19 DIAGNOSIS — Z125 Encounter for screening for malignant neoplasm of prostate: Secondary | ICD-10-CM

## 2019-10-19 DIAGNOSIS — E559 Vitamin D deficiency, unspecified: Secondary | ICD-10-CM | POA: Diagnosis not present

## 2019-10-19 DIAGNOSIS — R739 Hyperglycemia, unspecified: Secondary | ICD-10-CM

## 2019-10-19 DIAGNOSIS — E7800 Pure hypercholesterolemia, unspecified: Secondary | ICD-10-CM

## 2019-10-19 DIAGNOSIS — F17200 Nicotine dependence, unspecified, uncomplicated: Secondary | ICD-10-CM

## 2019-10-19 DIAGNOSIS — G8929 Other chronic pain: Secondary | ICD-10-CM

## 2019-10-19 LAB — LIPID PANEL
Cholesterol: 213 mg/dL — ABNORMAL HIGH (ref 0–200)
HDL: 45.2 mg/dL (ref >39.00)
LDL Cholesterol: 146 mg/dL — ABNORMAL HIGH (ref 0–99)
NonHDL: 167.53
Total CHOL/HDL Ratio: 5
Triglycerides: 108 mg/dL (ref 0.0–149.0)
VLDL: 21.6 mg/dL (ref 0.0–40.0)

## 2019-10-19 LAB — HEPATIC FUNCTION PANEL
ALT: 20 U/L (ref 0–53)
AST: 19 U/L (ref 0–37)
Albumin: 4.4 g/dL (ref 3.5–5.2)
Alkaline Phosphatase: 71 U/L (ref 39–117)
Bilirubin, Direct: 0.1 mg/dL (ref 0.0–0.3)
Total Bilirubin: 0.4 mg/dL (ref 0.2–1.2)
Total Protein: 6.6 g/dL (ref 6.0–8.3)

## 2019-10-19 LAB — BASIC METABOLIC PANEL
BUN: 19 mg/dL (ref 6–23)
CO2: 28 mEq/L (ref 19–32)
Calcium: 9.2 mg/dL (ref 8.4–10.5)
Chloride: 105 mEq/L (ref 96–112)
Creatinine, Ser: 0.85 mg/dL (ref 0.40–1.50)
GFR: 92.45 mL/min (ref >60.00)
Glucose, Bld: 106 mg/dL — ABNORMAL HIGH (ref 70–99)
Potassium: 4.3 mEq/L (ref 3.5–5.1)
Sodium: 138 mEq/L (ref 135–145)

## 2019-10-19 LAB — CBC WITH DIFFERENTIAL/PLATELET
Basophils Absolute: 0 10*3/uL (ref 0.0–0.1)
Basophils Relative: 0.6 % (ref 0.0–3.0)
Eosinophils Absolute: 0.1 10*3/uL (ref 0.0–0.7)
Eosinophils Relative: 1.4 % (ref 0.0–5.0)
HCT: 45.1 % (ref 39.0–52.0)
Hemoglobin: 15.1 g/dL (ref 13.0–17.0)
Lymphocytes Relative: 19.3 % (ref 12.0–46.0)
Lymphs Abs: 1.5 10*3/uL (ref 0.7–4.0)
MCHC: 33.6 g/dL (ref 30.0–36.0)
MCV: 91.1 fl (ref 78.0–100.0)
Monocytes Absolute: 0.5 10*3/uL (ref 0.1–1.0)
Monocytes Relative: 7.1 % (ref 3.0–12.0)
Neutro Abs: 5.5 10*3/uL (ref 1.4–7.7)
Neutrophils Relative %: 71.6 % (ref 43.0–77.0)
Platelets: 269 10*3/uL (ref 150.0–400.0)
RBC: 4.95 Mil/uL (ref 4.22–5.81)
RDW: 14.1 % (ref 11.5–15.5)
WBC: 7.6 10*3/uL (ref 4.0–10.5)

## 2019-10-19 LAB — PSA: PSA: 2.81 ng/mL (ref 0.10–4.00)

## 2019-10-19 LAB — HEMOGLOBIN A1C: Hgb A1c MFr Bld: 6.2 % (ref 4.6–6.5)

## 2019-10-19 LAB — TSH: TSH: 2.09 u[IU]/mL (ref 0.35–4.50)

## 2019-10-19 MED ORDER — TRAMADOL HCL 50 MG PO TABS: 50.0000 mg | ORAL_TABLET | Freq: Every day | ORAL | 0 refills | Status: DC

## 2019-10-19 NOTE — Assessment & Plan Note (Signed)
On lovastatin.  Low cholesterol diet and exercise.  Follow lipid panel and liver function tests.   

## 2019-10-19 NOTE — Assessment & Plan Note (Signed)
Discussed the need for smoking cessation.  Declines to stop.

## 2019-10-19 NOTE — Assessment & Plan Note (Signed)
Blood pressure as outlined.  Have him to continue to spot check readings.  Follow pressures.  Continue current medication.  Follow metabolic panel.

## 2019-10-19 NOTE — Progress Notes (Signed)
[name], male   DOB: [birth year], 58 y.o.   MRN: [id number]   Subjective:    [name], male    DOB: [birth year], 58 y.o.   MRN: [id number]  HPI  Patient here for a scheduled follow up.  He is doing well.  States he feels "great". His new relationship is working out well.  States he is eating well.  Has lost weight.  Not trying to lose weight.  Stays active.  No chest pain.  No sob.  No acid reflux.  No abdominal pain.  Bowels moving.  Blood pressure he reports is doing well.     Past Medical History:  Diagnosis Date  . Chronic leg pain    s/p injury right leg (age 10)  . GERD (gastroesophageal reflux disease)   . History of ankle fracture    persistent pain  . Hypercholesterolemia   . Hypertension   . Nephrolithiasis    followed by Edrick Oh  . Vitamin D deficiency    Past Surgical History:  Procedure Laterality Date  . ABDOMINAL SURGERY     after accident.  repaired spleen  . LEG SURGERY     Family History  Problem Relation Age of Onset  . Colon cancer Father   . Heart disease Father        has a pacemaker and defibrillator  . Hypertension Father   . Diabetes Father    Social History   Socioeconomic History  . Marital status: Married    Spouse name: Not on file  . Number of children: Not on file  . Years of education: Not on file  . Highest education level: Not on file  Occupational History  . Not on file  Social Needs  . Financial resource strain: Not on file  . Food insecurity    Worry: Not on file    Inability: Not on file  . Transportation needs    Medical: Not on file    Non-medical: Not on file  Tobacco Use  . Smoking status: Current Every Day Smoker    Packs/day: 1.00    Types: Cigarettes  . 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 Alcohol use: No    Alcohol/week: 0.0 standard drinks  . Drug use: No  . Sexual activity: Not on file  Lifestyle  . Physical activity    Days per week: Not  on file    Minutes per session: Not on file  . Stress: Not on file  Relationships  . Social Herbalist on phone: Not on file    Gets together: Not on file    Attends religious service: Not on file    Active member of club or organization: Not on file    Attends meetings of clubs or organizations: Not on file    Relationship status: Not on file  Other Topics Concern  . Not on file  Social History Narrative  . Not on file    Outpatient Encounter Medications as of 10/19/2019  Medication Sig  . cholecalciferol (VITAMIN D) 400 units TABS tablet Take 400 Units by mouth.  . cyclobenzaprine (FLEXERIL) 5 MG tablet Begin only at night time, as may cause drowsiness.  Marland Kitchen esomeprazole (NEXIUM) 40 MG capsule Take 20 mg by mouth daily.  Marland Kitchen lovastatin (MEVACOR) 40 MG tablet TAKE 1 TABLET BY MOUTH DAILY AT BEDTIME  . traMADol (ULTRAM) 50 MG tablet Take 1 tablet (50 mg total)  by mouth daily.  . [DISCONTINUED] amLODipine (NORVASC) 10 MG tablet TAKE 1 TABLET BY MOUTH ONCE A DAY  . [DISCONTINUED] traMADol (ULTRAM) 50 MG tablet TAKE 1 TABLET BY MOUTH ONCE A DAY   Facility-Administered Encounter Medications as of 10/19/2019  Medication  . betamethasone acetate-betamethasone sodium phosphate (CELESTONE) injection 3 mg    Review of Systems  Constitutional: Negative for appetite change.       Weight loss.   HENT: Negative for congestion and sinus pressure.   Respiratory: Negative for cough, chest tightness and shortness of breath.   Cardiovascular: Negative for chest pain, palpitations and leg swelling.  Gastrointestinal: Negative for abdominal pain, diarrhea and nausea.  Genitourinary: Negative for difficulty urinating and dysuria.  Musculoskeletal: Negative for joint swelling and myalgias.  Skin: Negative for color change and rash.  Neurological: Negative for dizziness, light-headedness and headaches.  Psychiatric/Behavioral: Negative for agitation and dysphoric mood.       Objective:     Physical Exam Constitutional:      General: He is not in acute distress.    Appearance: Normal appearance. He is well-developed.  HENT:     Head: Normocephalic and atraumatic.     Right Ear: External ear normal.     Left Ear: External ear normal.  Eyes:     General: No scleral icterus.       Right eye: No discharge.        Left eye: No discharge.     Conjunctiva/sclera: Conjunctivae normal.  Cardiovascular:     Rate and Rhythm: Normal rate and regular rhythm.  Pulmonary:     Effort: Pulmonary effort is normal. No respiratory distress.     Breath sounds: Normal breath sounds.  Abdominal:     General: Bowel sounds are normal.     Palpations: Abdomen is soft.     Tenderness: There is no abdominal tenderness.  Musculoskeletal:        General: No swelling or tenderness.  Skin:    Findings: No erythema or rash.  Neurological:     Mental Status: He is alert.  Psychiatric:        Mood and Affect: Mood normal.        Behavior: Behavior normal.     BP 128/70   Pulse 71   Temp 98.3 F (36.8 C)   Resp 16   Wt 143 lb 6.4 oz (65 kg)   SpO2 98%   BMI 23.15 kg/m  Wt Readings from Last 3 Encounters:  10/19/19 143 lb 6.4 oz (65 kg)  01/05/19 159 lb 6.4 oz (72.3 kg)  11/10/18 161 lb (73 kg)     Lab Results  Component Value Date   WBC 7.6 10/19/2019   HGB 15.1 10/19/2019   HCT 45.1 10/19/2019   PLT 269.0 10/19/2019   GLUCOSE 106 (H) 10/19/2019   CHOL 213 (H) 10/19/2019   TRIG 108.0 10/19/2019   HDL 45.20 10/19/2019   LDLDIRECT 210.3 11/30/2013   LDLCALC 146 (H) 10/19/2019   ALT 20 10/19/2019   AST 19 10/19/2019   NA 138 10/19/2019   K 4.3 10/19/2019   CL 105 10/19/2019   CREATININE 0.85 10/19/2019   BUN 19 10/19/2019   CO2 28 10/19/2019   TSH 2.09 10/19/2019   PSA 2.81 10/19/2019   HGBA1C 6.2 10/19/2019    Dg Chest 2 View  Result Date: 11/04/2018 CLINICAL DATA:  Chest pain, chest pressure. EXAM: CHEST - 2 VIEW COMPARISON:  Chest x-ray dated 02/07/2017.  FINDINGS: Heart  size and mediastinal contours are within normal limits. Small platelike opacity within the LEFT perihilar lung, better appreciated on the lateral projection, atelectasis versus pneumonia. RIGHT lung is clear. No pleural effusion or pneumothorax bilaterally. Mild chronic appearing vertebral body wedging at multiple levels of the thoracic spine. No acute or suspicious osseous finding. IMPRESSION: Subtle platelike opacity within the LEFT perihilar lung, atelectasis versus pneumonia, favor early developing pneumonia. Recommend follow-up chest x-ray to ensure resolution. Electronically Signed   By: Franki Cabot M.D.   On: 11/04/2018 16:15   Ct Angio Chest Pe W And/or Wo Contrast  Result Date: 11/04/2018 CLINICAL DATA:  Chest pain EXAM: CT ANGIOGRAPHY CHEST WITH CONTRAST TECHNIQUE: Multidetector CT imaging of the chest was performed using the standard protocol during bolus administration of intravenous contrast. Multiplanar CT image reconstructions and MIPs were obtained to evaluate the vascular anatomy. CONTRAST:  33m OMNIPAQUE IOHEXOL 350 MG/ML SOLN COMPARISON:  None. FINDINGS: Cardiovascular: --Pulmonary arteries: Contrast injection is sufficient to demonstrate satisfactory opacification of the pulmonary arteries to the segmental level. There is no pulmonary embolus. The main pulmonary artery is within normal limits for size. --Aorta: Satisfactory opacification of the thoracic aorta. No aortic dissection or other acute aortic syndrome. Conventional 3 vessel aortic branching pattern. The aortic course and caliber are normal. There is no aortic atherosclerosis. --Heart: Normal size. No pericardial effusion. Mediastinum/Nodes: No mediastinal, hilar or axillary lymphadenopathy. The visualized thyroid and thoracic esophageal course are unremarkable. Lungs/Pleura: No pulmonary nodules or masses. No pleural effusion or pneumothorax. No focal airspace consolidation. No focal pleural abnormality. Upper  Abdomen: Contrast bolus timing is not optimized for evaluation of the abdominal organs. Within this limitation, the visualized organs of the upper abdomen are normal. Musculoskeletal: No chest wall abnormality. No acute or significant osseous findings. Review of the MIP images confirms the above findings. IMPRESSION: No pulmonary embolus or other acute thoracic abnormality. Electronically Signed   By: KUlyses JarredM.D.   On: 11/04/2018 18:32       Assessment & Plan:   Problem List Items Addressed This Visit    Family history of colon cancer    Colonoscopy 02/2015.  Recommended f/u in 5 years.       Hyperglycemia     Follow met b and a1c.       Relevant Orders   Hemoglobin A1c (Completed)   Hypertension    Blood pressure as outlined.  Have him to continue to spot check readings.  Follow pressures.  Continue current medication.  Follow metabolic panel.       Relevant Orders   TSH (Completed)   Basic metabolic panel (Completed)   Pure hypercholesterolemia    On lovastatin.  Low cholesterol diet and exercise.  Follow lipid panel and liver function tests.        Tobacco use disorder    Discussed the need for smoking cessation.  Declines to stop.        Vitamin D deficiency, unspecified    Follow vitamin D level.        Relevant Orders   Hepatic function panel (Completed)   Lipid panel (Completed)   Weight loss - Primary    Discussed with him today.  Check routine labs.  Eating.  No GI symptoms.  Discussed further w/up.  He declines.  Discussed screening chest CT with smoking history.  He declines.  Follow.        Relevant Orders   CBC with Differential/Platelet (Completed)    Other Visit Diagnoses  Prostate cancer screening       Relevant Orders   PSA (Completed)   Chronic pain of right lower extremity       Relevant Medications   traMADol (ULTRAM) 50 MG tablet   Need for immunization against influenza       Relevant Orders   Flu Vaccine QUAD 36+ mos IM (Completed)        Einar Pheasant, MD

## 2019-10-19 NOTE — Assessment & Plan Note (Signed)
Follow vitamin D level.  

## 2019-10-19 NOTE — Assessment & Plan Note (Addendum)
Follow met b and a1c.  

## 2019-10-19 NOTE — Assessment & Plan Note (Signed)
Colonoscopy 02/2015.  Recommended f/u in 5 years.  

## 2019-10-21 ENCOUNTER — Encounter: Payer: Self-pay | Admitting: Internal Medicine

## 2019-10-21 NOTE — Assessment & Plan Note (Signed)
Discussed with him today.  Check routine labs.  Eating.  No GI symptoms.  Discussed further w/up.  He declines.  Discussed screening chest CT with smoking history.  He declines.  Follow.

## 2020-01-04 ENCOUNTER — Other Ambulatory Visit: Payer: Self-pay

## 2020-01-04 ENCOUNTER — Ambulatory Visit (INDEPENDENT_AMBULATORY_CARE_PROVIDER_SITE_OTHER): Payer: BC Managed Care – PPO | Admitting: Internal Medicine

## 2020-01-04 VITALS — Ht 66.0 in | Wt 150.0 lb

## 2020-01-04 DIAGNOSIS — E7800 Pure hypercholesterolemia, unspecified: Secondary | ICD-10-CM | POA: Diagnosis not present

## 2020-01-04 DIAGNOSIS — R739 Hyperglycemia, unspecified: Secondary | ICD-10-CM

## 2020-01-04 DIAGNOSIS — K219 Gastro-esophageal reflux disease without esophagitis: Secondary | ICD-10-CM

## 2020-01-04 DIAGNOSIS — I10 Essential (primary) hypertension: Secondary | ICD-10-CM | POA: Diagnosis not present

## 2020-01-04 DIAGNOSIS — R634 Abnormal weight loss: Secondary | ICD-10-CM

## 2020-01-04 DIAGNOSIS — F17200 Nicotine dependence, unspecified, uncomplicated: Secondary | ICD-10-CM

## 2020-01-04 MED ORDER — AMLODIPINE BESYLATE 10 MG PO TABS: 10.0000 mg | ORAL_TABLET | Freq: Every day | ORAL | 2 refills | Status: DC

## 2020-01-04 MED ORDER — LOVASTATIN 40 MG PO TABS: 40.0000 mg | ORAL_TABLET | Freq: Every day | ORAL | 5 refills | Status: DC

## 2020-01-04 NOTE — Progress Notes (Signed)
Virtual Visit via telephone Note  This visit type was conducted due to national recommendations for restrictions regarding the COVID-19 pandemic (e.g. social distancing).  This format is felt to be most appropriate for this patient at this time.  All issues noted in this document were discussed and addressed.  No physical exam was performed (except for noted visual exam findings with Video Visits).   I connected with Thomas Harper by telephone and verified that I am speaking with the correct person using two identifiers. Location patient: home Location provider: work  Persons participating in the virtual visit: patient, provider  The limitations, risks, security and privacy concerns of performing an evaluation and management service by telephone and the availability of in person appointments have been discussed.  The patient expressed understanding and agreed to proceed.   Reason for visit: scheduled follow up.    HPI: He reports he is doing relatively well. Relationship going well.  Working a lot.  Has been wearing mask, washing hands, etc.  Has not been sick.  When active, he reports no chest pain or sob.  No acid reflux.  Controlled with nexium.  Discussed taking nexium by itself in am - 30 minutes before eating.  He is eating well.  No nausea or vomiting.  Bowels moving.  No abdominal pain.  Has gained weight.  States weight is now 150 pounds.  Discussed smoking.  He is trying to cut back.  Desires no medication.  Did not have blood pressure readings.  States is runny a little high at times.  Reports last check - girlfriend told him it was "ok".  Overall he feels good.  Handling stress.  Not taking tramadol now.     ROS: See pertinent positives and negatives per HPI.  Past Medical History:  Diagnosis Date  . Chronic leg pain    s/p injury right leg (age 59)  . GERD (gastroesophageal reflux disease)   . History of ankle fracture    persistent pain  . Hypercholesterolemia   .  Hypertension   . Nephrolithiasis    followed by Edrick Oh  . Vitamin D deficiency     Past Surgical History:  Procedure Laterality Date  . ABDOMINAL SURGERY     after accident.  repaired spleen  . LEG SURGERY      Family History  Problem Relation Age of Onset  . Colon cancer Father   . Heart disease Father        has a pacemaker and defibrillator  . Hypertension Father   . Diabetes Father     SOCIAL HX: reviewed.    Current Outpatient Medications:  .  amLODipine (NORVASC) 10 MG tablet, Take 1 tablet (10 mg total) by mouth daily., Disp: 30 tablet, Rfl: 2 .  cholecalciferol (VITAMIN D) 400 units TABS tablet, Take 400 Units by mouth., Disp: , Rfl:  .  cyclobenzaprine (FLEXERIL) 5 MG tablet, Begin only at night time, as may cause drowsiness., Disp: , Rfl:  .  esomeprazole (NEXIUM) 40 MG capsule, Take 20 mg by mouth daily., Disp: , Rfl:  .  lovastatin (MEVACOR) 40 MG tablet, Take 1 tablet (40 mg total) by mouth at bedtime., Disp: 30 tablet, Rfl: 5 .  traMADol (ULTRAM) 50 MG tablet, Take 1 tablet (50 mg total) by mouth daily., Disp: 30 tablet, Rfl: 0  Current Facility-Administered Medications:  .  betamethasone acetate-betamethasone sodium phosphate (CELESTONE) injection 3 mg, 3 mg, Intramuscular, Once, Evans, Dorathy Daft, DPM  EXAM:  GENERAL: alert.  Sounds to be in no acute distress.  Answering question appropriately.    PSYCH/NEURO: pleasant and cooperative, no obvious depression or anxiety, speech and thought processing grossly intact  ASSESSMENT AND PLAN:  Discussed the following assessment and plan:  Esophageal reflux Controlled on nexium.  Discussed adjustment on when he takes the medication (not take with amlodipine).    Hyperglycemia Low carb diet and exercise.  Follow met b and a1c.   Hypertension Blood pressure as outlined.  Have him spot check pressure.  Send in readings.  Follow pressures.  Follow metabolic panel.   Pure hypercholesterolemia On lovasatin.   Low cholesterol diet and exercise.  Follow lipid panel and liver function tests.    Tobacco use disorder Discussed the need to quit smoking.  He has cut back.  Desires not to quit at this time.  Follow.    Weight loss Reports eating well.  Has gained weight.  Follow.     Orders Placed This Encounter  Procedures  . Hemoglobin A1c    Standing Status:   Future    Standing Expiration Date:   01/03/2021  . Hepatic function panel    Standing Status:   Future    Standing Expiration Date:   01/03/2021  . Lipid panel    Standing Status:   Future    Standing Expiration Date:   01/03/2021  . Basic metabolic panel (future)    Standing Status:   Future    Standing Expiration Date:   01/03/2021    Meds ordered this encounter  Medications  . lovastatin (MEVACOR) 40 MG tablet    Sig: Take 1 tablet (40 mg total) by mouth at bedtime.    Dispense:  30 tablet    Refill:  5  . amLODipine (NORVASC) 10 MG tablet    Sig: Take 1 tablet (10 mg total) by mouth daily.    Dispense:  30 tablet    Refill:  2     I discussed the assessment and treatment plan with the patient. The patient was provided an opportunity to ask questions and all were answered. The patient agreed with the plan and demonstrated an understanding of the instructions.   The patient was advised to call back or seek an in-person evaluation if the symptoms worsen or if the condition fails to improve as anticipated.  I provided 13 minutes of non-face-to-face time during this encounter.   Einar Pheasant, MD

## 2020-01-04 NOTE — Assessment & Plan Note (Signed)
Controlled on nexium.  Discussed adjustment on when he takes the medication (not take with amlodipine).

## 2020-01-06 ENCOUNTER — Encounter: Payer: Self-pay | Admitting: Internal Medicine

## 2020-01-06 NOTE — Assessment & Plan Note (Signed)
Blood pressure as outlined.  Have him spot check pressure.  Send in readings.  Follow pressures.  Follow metabolic panel.

## 2020-01-06 NOTE — Assessment & Plan Note (Signed)
On lovasatin.  Low cholesterol diet and exercise.  Follow lipid panel and liver function tests.

## 2020-01-06 NOTE — Assessment & Plan Note (Signed)
Low carb diet and exercise.  Follow met b and a1c.  

## 2020-01-07 NOTE — Assessment & Plan Note (Signed)
Discussed the need to quit smoking.  He has cut back.  Desires not to quit at this time.  Follow.

## 2020-01-07 NOTE — Assessment & Plan Note (Signed)
Reports eating well.  Has gained weight.  Follow.

## 2020-02-08 ENCOUNTER — Other Ambulatory Visit (INDEPENDENT_AMBULATORY_CARE_PROVIDER_SITE_OTHER): Payer: BC Managed Care – PPO

## 2020-02-08 ENCOUNTER — Other Ambulatory Visit: Payer: Self-pay

## 2020-02-08 DIAGNOSIS — I10 Essential (primary) hypertension: Secondary | ICD-10-CM | POA: Diagnosis not present

## 2020-02-08 DIAGNOSIS — E7800 Pure hypercholesterolemia, unspecified: Secondary | ICD-10-CM | POA: Diagnosis not present

## 2020-02-08 DIAGNOSIS — R739 Hyperglycemia, unspecified: Secondary | ICD-10-CM

## 2020-02-08 LAB — LIPID PANEL
Cholesterol: 190 mg/dL (ref 0–200)
HDL: 52.9 mg/dL
LDL Cholesterol: 126 mg/dL — ABNORMAL HIGH (ref 0–99)
NonHDL: 136.73
Total CHOL/HDL Ratio: 4
Triglycerides: 56 mg/dL (ref 0.0–149.0)
VLDL: 11.2 mg/dL (ref 0.0–40.0)

## 2020-02-08 LAB — HEPATIC FUNCTION PANEL
ALT: 20 U/L (ref 0–53)
AST: 21 U/L (ref 0–37)
Albumin: 4.3 g/dL (ref 3.5–5.2)
Alkaline Phosphatase: 70 U/L (ref 39–117)
Bilirubin, Direct: 0.1 mg/dL (ref 0.0–0.3)
Total Bilirubin: 0.4 mg/dL (ref 0.2–1.2)
Total Protein: 7 g/dL (ref 6.0–8.3)

## 2020-02-08 LAB — BASIC METABOLIC PANEL
BUN: 17 mg/dL (ref 6–23)
CO2: 27 mEq/L (ref 19–32)
Calcium: 9.2 mg/dL (ref 8.4–10.5)
Chloride: 104 mEq/L (ref 96–112)
Creatinine, Ser: 0.87 mg/dL (ref 0.40–1.50)
GFR: 89.9 mL/min (ref >60.00)
Glucose, Bld: 90 mg/dL (ref 70–99)
Potassium: 4.1 mEq/L (ref 3.5–5.1)
Sodium: 138 mEq/L (ref 135–145)

## 2020-02-08 LAB — HEMOGLOBIN A1C: Hgb A1c MFr Bld: 6.2 % (ref 4.6–6.5)

## 2020-04-11 ENCOUNTER — Encounter: Payer: PRIVATE HEALTH INSURANCE | Admitting: Internal Medicine

## 2020-05-02 ENCOUNTER — Other Ambulatory Visit: Payer: Self-pay

## 2020-05-02 ENCOUNTER — Ambulatory Visit (INDEPENDENT_AMBULATORY_CARE_PROVIDER_SITE_OTHER): Payer: BC Managed Care – PPO | Admitting: Internal Medicine

## 2020-05-02 VITALS — BP 120/76 | HR 88 | Temp 97.4°F | Resp 16 | Ht 66.0 in | Wt 148.0 lb

## 2020-05-02 DIAGNOSIS — R739 Hyperglycemia, unspecified: Secondary | ICD-10-CM

## 2020-05-02 DIAGNOSIS — Z0000 Encounter for general adult medical examination without abnormal findings: Secondary | ICD-10-CM

## 2020-05-02 DIAGNOSIS — M79602 Pain in left arm: Secondary | ICD-10-CM

## 2020-05-02 DIAGNOSIS — R634 Abnormal weight loss: Secondary | ICD-10-CM | POA: Diagnosis not present

## 2020-05-02 DIAGNOSIS — Z800 Family history of malignant neoplasm of digestive organs: Secondary | ICD-10-CM

## 2020-05-02 DIAGNOSIS — E7800 Pure hypercholesterolemia, unspecified: Secondary | ICD-10-CM | POA: Diagnosis not present

## 2020-05-02 DIAGNOSIS — K219 Gastro-esophageal reflux disease without esophagitis: Secondary | ICD-10-CM

## 2020-05-02 NOTE — Progress Notes (Signed)
[name], male   DOB: 1961/02/06, 59 y.o.   MRN: [id number]   Subjective:    [name], male    DOB: 01-31-1961, 59 y.o.   MRN: [id number]  HPI This visit occurred during the SARS-CoV-2 public health emergency.  Safety protocols were in place, including screening questions prior to the visit, additional usage of staff PPE, and extensive cleaning of exam room while observing appropriate contact time as indicated for disinfecting solutions.  Patient here for his physical exam.  He reports he is doing well.  Still some left shoulder discomfort at times.  No severe pain.  Previous fall - one year ago.  Fell on his left shoulder.  Good rom.  Desires no further evaluation.  No chest pain or sob.  Working.  No abdominal pain or bowel change.  Eating well.  Blood pressures doing well.  Is planning to stop smoking - quit date - end of month.   Has cut down.    Past Medical History:  Diagnosis Date  . Chronic leg pain    s/p injury right leg (age 3)  . GERD (gastroesophageal reflux disease)   . History of ankle fracture    persistent pain  . Hypercholesterolemia   . Hypertension   . Nephrolithiasis    followed by Edrick Oh  . Vitamin D deficiency    Past Surgical History:  Procedure Laterality Date  . ABDOMINAL SURGERY     after accident.  repaired spleen  . LEG SURGERY     Family History  Problem Relation Age of Onset  . Colon cancer Father   . Heart disease Father        has a pacemaker and defibrillator  . Hypertension Father   . Diabetes Father    Social History   Socioeconomic History  . Marital status: Married    Spouse name: Not on file  . Number of children: Not on file  . Years of education: Not on file  . Highest education level: Not on file  Occupational History  . Not on file  Tobacco Use  . Smoking status: Current Every Day Smoker    Packs/day: 1.00    Types: Cigarettes  . Smokeless tobacco: Former Network engineer and Sexual  Activity  . Alcohol use: No    Alcohol/week: 0.0 standard drinks  . Drug use: No  . Sexual activity: Not on file  Other Topics Concern  . Not on file  Social History Narrative  . Not on file   Social Determinants of Health   Financial Resource Strain:   . Difficulty of Paying Living Expenses:   Food Insecurity:   . Worried About Charity fundraiser in the Last Year:   . Arboriculturist in the Last Year:   Transportation Needs:   . Film/video editor (Medical):   Marland Kitchen Lack of Transportation (Non-Medical):   Physical Activity:   . Days of Exercise per Week:   . Minutes of Exercise per Session:   Stress:   . Feeling of Stress :   Social Connections:   . Frequency of Communication with Friends and Family:   . Frequency of Social Gatherings with Friends and Family:   . Attends Religious Services:   . Active Member of Clubs or Organizations:   . Attends Archivist Meetings:   Marland Kitchen Marital Status:     Outpatient Encounter Medications as of 05/02/2020  Medication Sig  . amLODipine (  NORVASC) 10 MG tablet Take 1 tablet (10 mg total) by mouth daily.  . cholecalciferol (VITAMIN D) 400 units TABS tablet Take 400 Units by mouth.  . cyclobenzaprine (FLEXERIL) 5 MG tablet Begin only at night time, as may cause drowsiness.  . esomeprazole (NEXIUM) 40 MG capsule Take 20 mg by mouth daily.  . lovastatin (MEVACOR) 40 MG tablet Take 1 tablet (40 mg total) by mouth at bedtime.  . traMADol (ULTRAM) 50 MG tablet Take 1 tablet (50 mg total) by mouth daily.   Facility-Administered Encounter Medications as of 05/02/2020  Medication  . betamethasone acetate-betamethasone sodium phosphate (CELESTONE) injection 3 mg    Review of Systems  Constitutional: Negative for appetite change and unexpected weight change.  HENT: Negative for congestion and sinus pressure.   Eyes: Negative for pain and visual disturbance.  Respiratory: Negative for cough, chest tightness and shortness of breath.     Cardiovascular: Negative for chest pain, palpitations and leg swelling.  Gastrointestinal: Negative for abdominal pain, diarrhea, nausea and vomiting.  Genitourinary: Negative for difficulty urinating and dysuria.  Musculoskeletal: Negative for joint swelling and myalgias.  Skin: Negative for color change and rash.  Neurological: Negative for dizziness, light-headedness and headaches.  Hematological: Negative for adenopathy. Does not bruise/bleed easily.  Psychiatric/Behavioral: Negative for agitation and dysphoric mood.       Objective:    Physical Exam Constitutional:      General: He is not in acute distress.    Appearance: Normal appearance. He is well-developed.  HENT:     Head: Normocephalic and atraumatic.  Eyes:     General:        Right eye: No discharge.        Left eye: No discharge.     Conjunctiva/sclera: Conjunctivae normal.  Neck:     Thyroid: No thyromegaly.  Cardiovascular:     Rate and Rhythm: Normal rate and regular rhythm.  Pulmonary:     Effort: No respiratory distress.     Breath sounds: Normal breath sounds. No wheezing.  Abdominal:     General: Bowel sounds are normal.     Palpations: Abdomen is soft.     Tenderness: There is no abdominal tenderness.  Genitourinary:    Comments: Not performed.  Musculoskeletal:        General: No swelling or tenderness.     Cervical back: Neck supple. No tenderness.  Lymphadenopathy:     Cervical: No cervical adenopathy.  Skin:    Findings: No erythema or rash.  Neurological:     Mental Status: He is alert and oriented to person, place, and time.  Psychiatric:        Mood and Affect: Mood normal.        Behavior: Behavior normal.     BP 120/76   Pulse 88   Temp (!) 97.4 F (36.3 C)   Resp 16   Ht 5' 6" (1.676 m)   Wt 148 lb (67.1 kg)   SpO2 98%   BMI 23.89 kg/m  Wt Readings from Last 3 Encounters:  05/02/20 148 lb (67.1 kg)  01/04/20 150 lb (68 kg)  10/19/19 143 lb 6.4 oz (65 kg)     Lab  Results  Component Value Date   WBC 7.6 10/19/2019   HGB 15.1 10/19/2019   HCT 45.1 10/19/2019   PLT 269.0 10/19/2019   GLUCOSE 90 02/08/2020   CHOL 190 02/08/2020   TRIG 56.0 02/08/2020   HDL 52.90 02/08/2020   LDLDIRECT 210.3 11/30/2013     LDLCALC 126 (H) 02/08/2020   ALT 20 02/08/2020   AST 21 02/08/2020   NA 138 02/08/2020   K 4.1 02/08/2020   CL 104 02/08/2020   CREATININE 0.87 02/08/2020   BUN 17 02/08/2020   CO2 27 02/08/2020   TSH 2.09 10/19/2019   PSA 2.81 10/19/2019   HGBA1C 6.2 02/08/2020    DG Chest 2 View  Result Date: 11/04/2018 CLINICAL DATA:  Chest pain, chest pressure. EXAM: CHEST - 2 VIEW COMPARISON:  Chest x-ray dated 02/07/2017. FINDINGS: Heart size and mediastinal contours are within normal limits. Small platelike opacity within the LEFT perihilar lung, better appreciated on the lateral projection, atelectasis versus pneumonia. RIGHT lung is clear. No pleural effusion or pneumothorax bilaterally. Mild chronic appearing vertebral body wedging at multiple levels of the thoracic spine. No acute or suspicious osseous finding. IMPRESSION: Subtle platelike opacity within the LEFT perihilar lung, atelectasis versus pneumonia, favor early developing pneumonia. Recommend follow-up chest x-ray to ensure resolution. Electronically Signed   By: Stan  Maynard M.D.   On: 11/04/2018 16:15   CT Angio Chest PE W and/or Wo Contrast  Result Date: 11/04/2018 CLINICAL DATA:  Chest pain EXAM: CT ANGIOGRAPHY CHEST WITH CONTRAST TECHNIQUE: Multidetector CT imaging of the chest was performed using the standard protocol during bolus administration of intravenous contrast. Multiplanar CT image reconstructions and MIPs were obtained to evaluate the vascular anatomy. CONTRAST:  75mL OMNIPAQUE IOHEXOL 350 MG/ML SOLN COMPARISON:  None. FINDINGS: Cardiovascular: --Pulmonary arteries: Contrast injection is sufficient to demonstrate satisfactory opacification of the pulmonary arteries to the  segmental level. There is no pulmonary embolus. The main pulmonary artery is within normal limits for size. --Aorta: Satisfactory opacification of the thoracic aorta. No aortic dissection or other acute aortic syndrome. Conventional 3 vessel aortic branching pattern. The aortic course and caliber are normal. There is no aortic atherosclerosis. --Heart: Normal size. No pericardial effusion. Mediastinum/Nodes: No mediastinal, hilar or axillary lymphadenopathy. The visualized thyroid and thoracic esophageal course are unremarkable. Lungs/Pleura: No pulmonary nodules or masses. No pleural effusion or pneumothorax. No focal airspace consolidation. No focal pleural abnormality. Upper Abdomen: Contrast bolus timing is not optimized for evaluation of the abdominal organs. Within this limitation, the visualized organs of the upper abdomen are normal. Musculoskeletal: No chest wall abnormality. No acute or significant osseous findings. Review of the MIP images confirms the above findings. IMPRESSION: No pulmonary embolus or other acute thoracic abnormality. Electronically Signed   By: Kevin  Herman M.D.   On: 11/04/2018 18:32       Assessment & Plan:   Problem List Items Addressed This Visit    Esophageal reflux    No upper symptoms on nexium.        Family history of colon cancer    Last colonoscopy 2016.  Due f/u colonoscopy.  He declines.  Will notify me when agreeable.        Health care maintenance    Physical today 05/02/20.  Colonoscopy 02/2015.  Recommended f/u in 5 years.  Check psa.        Hyperglycemia    Low carb diet and exercise.  Follow met b and a1c.        Relevant Orders   Hemoglobin A1c   Basic metabolic panel   Left arm pain    Previous fall.  Stable.  Previously saw rheumatology.  Desires no further intervention or evaluation.        Pure hypercholesterolemia    On lovastatin.  Follow lipid panel and liver   function tests.        Relevant Orders   Hepatic function panel    Lipid panel   Weight loss    Weight relatively stable.  Eating well.  Follow.            Einar Pheasant, MD

## 2020-05-02 NOTE — Assessment & Plan Note (Addendum)
Physical today 05/02/20.  Colonoscopy 02/2015.  Recommended f/u in 5 years.  Check psa.

## 2020-05-04 ENCOUNTER — Encounter: Payer: Self-pay | Admitting: Internal Medicine

## 2020-05-04 NOTE — Assessment & Plan Note (Signed)
Previous fall.  Stable.  Previously saw rheumatology.  Desires no further intervention or evaluation.

## 2020-05-04 NOTE — Assessment & Plan Note (Signed)
Last colonoscopy 2016.  Due f/u colonoscopy.  He declines.  Will notify me when agreeable.

## 2020-05-04 NOTE — Assessment & Plan Note (Signed)
No upper symptoms on nexium.

## 2020-05-04 NOTE — Assessment & Plan Note (Signed)
On lovastatin.  Follow lipid panel and liver function tests.   

## 2020-05-04 NOTE — Assessment & Plan Note (Signed)
Low carb diet and exercise.  Follow met b and a1c.   

## 2020-05-04 NOTE — Assessment & Plan Note (Signed)
Weight relatively stable.  Eating well.  Follow.

## 2020-05-30 ENCOUNTER — Other Ambulatory Visit: Payer: BC Managed Care – PPO

## 2020-06-06 ENCOUNTER — Other Ambulatory Visit: Payer: BC Managed Care – PPO

## 2020-06-13 ENCOUNTER — Other Ambulatory Visit: Payer: Self-pay

## 2020-06-13 ENCOUNTER — Other Ambulatory Visit (INDEPENDENT_AMBULATORY_CARE_PROVIDER_SITE_OTHER): Payer: BC Managed Care – PPO

## 2020-06-13 DIAGNOSIS — E7800 Pure hypercholesterolemia, unspecified: Secondary | ICD-10-CM

## 2020-06-13 DIAGNOSIS — R739 Hyperglycemia, unspecified: Secondary | ICD-10-CM

## 2020-06-13 LAB — LIPID PANEL
Cholesterol: 190 mg/dL (ref 0–200)
HDL: 47.1 mg/dL (ref >39.00)
LDL Cholesterol: 120 mg/dL — ABNORMAL HIGH (ref 0–99)
NonHDL: 142.64
Total CHOL/HDL Ratio: 4
Triglycerides: 111 mg/dL (ref 0.0–149.0)
VLDL: 22.2 mg/dL (ref 0.0–40.0)

## 2020-06-13 LAB — BASIC METABOLIC PANEL
BUN: 18 mg/dL (ref 6–23)
CO2: 30 mEq/L (ref 19–32)
Calcium: 9.6 mg/dL (ref 8.4–10.5)
Chloride: 105 mEq/L (ref 96–112)
Creatinine, Ser: 0.95 mg/dL (ref 0.40–1.50)
GFR: 81.13 mL/min (ref >60.00)
Glucose, Bld: 101 mg/dL — ABNORMAL HIGH (ref 70–99)
Potassium: 4.7 mEq/L (ref 3.5–5.1)
Sodium: 139 mEq/L (ref 135–145)

## 2020-06-13 LAB — HEPATIC FUNCTION PANEL
ALT: 21 U/L (ref 0–53)
AST: 19 U/L (ref 0–37)
Albumin: 4.5 g/dL (ref 3.5–5.2)
Alkaline Phosphatase: 73 U/L (ref 39–117)
Bilirubin, Direct: 0.1 mg/dL (ref 0.0–0.3)
Total Bilirubin: 0.3 mg/dL (ref 0.2–1.2)
Total Protein: 6.7 g/dL (ref 6.0–8.3)

## 2020-06-13 LAB — HEMOGLOBIN A1C: Hgb A1c MFr Bld: 6 % (ref 4.6–6.5)

## 2020-07-16 ENCOUNTER — Other Ambulatory Visit: Payer: Self-pay | Admitting: Internal Medicine

## 2020-10-01 ENCOUNTER — Other Ambulatory Visit: Payer: Self-pay | Admitting: Internal Medicine

## 2020-10-03 ENCOUNTER — Other Ambulatory Visit: Payer: Self-pay

## 2020-10-03 ENCOUNTER — Encounter: Payer: Self-pay | Admitting: Internal Medicine

## 2020-10-03 ENCOUNTER — Ambulatory Visit: Payer: BC Managed Care – PPO | Admitting: Internal Medicine

## 2020-10-03 VITALS — BP 124/86 | HR 75 | Temp 98.0°F | Resp 16 | Ht 66.0 in | Wt 153.0 lb

## 2020-10-03 DIAGNOSIS — E559 Vitamin D deficiency, unspecified: Secondary | ICD-10-CM | POA: Diagnosis not present

## 2020-10-03 DIAGNOSIS — I10 Essential (primary) hypertension: Secondary | ICD-10-CM | POA: Diagnosis not present

## 2020-10-03 DIAGNOSIS — Z23 Encounter for immunization: Secondary | ICD-10-CM

## 2020-10-03 DIAGNOSIS — F17200 Nicotine dependence, unspecified, uncomplicated: Secondary | ICD-10-CM

## 2020-10-03 DIAGNOSIS — E7800 Pure hypercholesterolemia, unspecified: Secondary | ICD-10-CM

## 2020-10-03 DIAGNOSIS — K219 Gastro-esophageal reflux disease without esophagitis: Secondary | ICD-10-CM

## 2020-10-03 DIAGNOSIS — Z800 Family history of malignant neoplasm of digestive organs: Secondary | ICD-10-CM

## 2020-10-03 DIAGNOSIS — R739 Hyperglycemia, unspecified: Secondary | ICD-10-CM

## 2020-10-03 LAB — BASIC METABOLIC PANEL
BUN: 13 mg/dL (ref 6–23)
CO2: 27 mEq/L (ref 19–32)
Calcium: 9.2 mg/dL (ref 8.4–10.5)
Chloride: 105 mEq/L (ref 96–112)
Creatinine, Ser: 0.9 mg/dL (ref 0.40–1.50)
GFR: 92.92 mL/min (ref >60.00)
Glucose, Bld: 97 mg/dL (ref 70–99)
Potassium: 4.3 mEq/L (ref 3.5–5.1)
Sodium: 139 mEq/L (ref 135–145)

## 2020-10-03 LAB — CBC WITH DIFFERENTIAL/PLATELET
Basophils Absolute: 0.1 10*3/uL (ref 0.0–0.1)
Basophils Relative: 1.1 % (ref 0.0–3.0)
Eosinophils Absolute: 0.1 10*3/uL (ref 0.0–0.7)
Eosinophils Relative: 1.7 % (ref 0.0–5.0)
HCT: 46.9 % (ref 39.0–52.0)
Hemoglobin: 15.7 g/dL (ref 13.0–17.0)
Lymphocytes Relative: 26.5 % (ref 12.0–46.0)
Lymphs Abs: 1.8 10*3/uL (ref 0.7–4.0)
MCHC: 33.4 g/dL (ref 30.0–36.0)
MCV: 89.8 fl (ref 78.0–100.0)
Monocytes Absolute: 0.6 10*3/uL (ref 0.1–1.0)
Monocytes Relative: 8.5 % (ref 3.0–12.0)
Neutro Abs: 4.3 10*3/uL (ref 1.4–7.7)
Neutrophils Relative %: 62.2 % (ref 43.0–77.0)
Platelets: 254 10*3/uL (ref 150.0–400.0)
RBC: 5.22 Mil/uL (ref 4.22–5.81)
RDW: 13.9 % (ref 11.5–15.5)
WBC: 6.8 10*3/uL (ref 4.0–10.5)

## 2020-10-03 LAB — LIPID PANEL
Cholesterol: 201 mg/dL — ABNORMAL HIGH (ref 0–200)
HDL: 48.3 mg/dL (ref >39.00)
LDL Cholesterol: 135 mg/dL — ABNORMAL HIGH (ref 0–99)
NonHDL: 152.98
Total CHOL/HDL Ratio: 4
Triglycerides: 90 mg/dL (ref 0.0–149.0)
VLDL: 18 mg/dL (ref 0.0–40.0)

## 2020-10-03 LAB — HEPATIC FUNCTION PANEL
ALT: 21 U/L (ref 0–53)
AST: 18 U/L (ref 0–37)
Albumin: 4.4 g/dL (ref 3.5–5.2)
Alkaline Phosphatase: 68 U/L (ref 39–117)
Bilirubin, Direct: 0.1 mg/dL (ref 0.0–0.3)
Total Bilirubin: 0.5 mg/dL (ref 0.2–1.2)
Total Protein: 6.9 g/dL (ref 6.0–8.3)

## 2020-10-03 LAB — HEMOGLOBIN A1C: Hgb A1c MFr Bld: 6.4 % (ref 4.6–6.5)

## 2020-10-03 LAB — TSH: TSH: 2.39 u[IU]/mL (ref 0.35–4.50)

## 2020-10-03 NOTE — Progress Notes (Signed)
[name], male   DOB: 10-Dec-1961, 59 y.o.   MRN: [id number]   Subjective:    [name], male    DOB: 1961/05/27, 59 y.o.   MRN: [id number]  HPI This visit occurred during the SARS-CoV-2 public health emergency.  Safety protocols were in place, including screening questions prior to the visit, additional usage of staff PPE, and extensive cleaning of exam room while observing appropriate contact time as indicated for disinfecting solutions.  Patient here for a scheduled follow up. He reports he is doing relatively well.  A couple of months ago, he reports he was working under his house - in a sealed crawl space.  Was working with torch and burned some insulation - accidentally.  That night, he reported difficulty breathing.  Was sob.  No increased cough or congestion.  States lasted for a couple of days and then resolved.  no chest pain, chest tightness or sob with increased activity or exertion.  States feels his lungs are back to normal.  Plays golf and tries to stay active.  No acid reflux.  No abdominal pain.  Bowels moving.  Handling stress.  In a good relationship.  Overall feels good. Intermittent flares with his ankle.  Overall stable.  Desires no further evaluation or intervention.      Past Medical History:  Diagnosis Date  . Chronic leg pain    s/p injury right leg (age 1)  . GERD (gastroesophageal reflux disease)   . History of ankle fracture    persistent pain  . Hypercholesterolemia   . Hypertension   . Nephrolithiasis    followed by Edrick Oh  . Vitamin D deficiency    Past Surgical History:  Procedure Laterality Date  . ABDOMINAL SURGERY     after accident.  repaired spleen  . LEG SURGERY     Family History  Problem Relation Age of Onset  . Colon cancer Father   . Heart disease Father        has a pacemaker and defibrillator  . Hypertension Father   . Diabetes Father    Social History   Socioeconomic History  . Marital  status: Married    Spouse name: Not on file  . Number of children: Not on file  . Years of education: Not on file  . Highest education level: Not on file  Occupational History  . Not on file  Tobacco Use  . Smoking status: Current Every Day Smoker    Packs/day: 1.00    Types: Cigarettes  . 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 Alcohol use: No    Alcohol/week: 0.0 standard drinks  . Drug use: No  . Sexual activity: Not on file  Other Topics Concern  . Not on file  Social History Narrative  . Not on file   Social Determinants of Health   Financial Resource Strain:   . Difficulty of Paying Living Expenses: Not on file  Food Insecurity:   . Worried About Charity fundraiser in the Last Year: Not on file  . Ran Out of Food in the Last Year: Not on file  Transportation Needs:   . Lack of Transportation (Medical): Not on file  . Lack of Transportation (Non-Medical): Not on file  Physical Activity:   . Days of Exercise per Week: Not on file  . Minutes of Exercise per Session: Not on file  Stress:   . Feeling of Stress :  Not on file  Social Connections:   . Frequency of Communication with Friends and Family: Not on file  . Frequency of Social Gatherings with Friends and Family: Not on file  . Attends Religious Services: Not on file  . Active Member of Clubs or Organizations: Not on file  . Attends Archivist Meetings: Not on file  . Marital Status: Not on file    Outpatient Encounter Medications as of 10/03/2020  Medication Sig  . amLODipine (NORVASC) 10 MG tablet TAKE 1 TABLET BY MOUTH ONCE DAILY  . cholecalciferol (VITAMIN D) 400 units TABS tablet Take 400 Units by mouth.  . cyclobenzaprine (FLEXERIL) 5 MG tablet Begin only at night time, as may cause drowsiness.  Marland Kitchen esomeprazole (NEXIUM) 40 MG capsule Take 20 mg by mouth daily.  Marland Kitchen lovastatin (MEVACOR) 40 MG tablet TAKE 1 TABLET BY MOUTH AT BEDTIME  . traMADol (ULTRAM) 50 MG tablet Take  1 tablet (50 mg total) by mouth daily.   Facility-Administered Encounter Medications as of 10/03/2020  Medication  . betamethasone acetate-betamethasone sodium phosphate (CELESTONE) injection 3 mg    Review of Systems  Constitutional: Negative for appetite change and unexpected weight change.  HENT: Negative for congestion and sinus pressure.   Respiratory: Negative for cough, chest tightness and shortness of breath.   Cardiovascular: Negative for chest pain, palpitations and leg swelling.  Gastrointestinal: Negative for abdominal pain, diarrhea, nausea and vomiting.  Genitourinary: Negative for difficulty urinating and dysuria.  Musculoskeletal: Negative for joint swelling and myalgias.  Skin: Negative for color change and rash.  Neurological: Negative for dizziness, light-headedness and headaches.  Psychiatric/Behavioral: Negative for agitation and dysphoric mood.       Objective:    Physical Exam Vitals reviewed.  Constitutional:      General: He is not in acute distress.    Appearance: Normal appearance. He is well-developed.  HENT:     Head: Normocephalic and atraumatic.     Right Ear: External ear normal.     Left Ear: External ear normal.  Eyes:     General: No scleral icterus.       Right eye: No discharge.        Left eye: No discharge.     Conjunctiva/sclera: Conjunctivae normal.  Cardiovascular:     Rate and Rhythm: Normal rate and regular rhythm.  Pulmonary:     Effort: Pulmonary effort is normal. No respiratory distress.     Breath sounds: Normal breath sounds.  Abdominal:     General: Bowel sounds are normal.     Palpations: Abdomen is soft.     Tenderness: There is no abdominal tenderness.  Musculoskeletal:        General: No swelling or tenderness.     Cervical back: Neck supple. No tenderness.  Lymphadenopathy:     Cervical: No cervical adenopathy.  Skin:    Findings: No erythema or rash.  Neurological:     Mental Status: He is alert.    Psychiatric:        Mood and Affect: Mood normal.        Behavior: Behavior normal.     BP 124/86   Pulse 75   Temp 98 F (36.7 C) (Oral)   Resp 16   Ht _0  (1.676 m)   Wt 153 lb (69.4 kg)   SpO2 98%   BMI 24.69 kg/m  Wt Readings from Last 3 Encounters:  10/03/20 153 lb (69.4 kg)  05/02/20 148 lb (67.1 kg)  01/04/20 150 lb (68 kg)     Lab Results  Component Value Date   WBC 6.8 10/03/2020   HGB 15.7 10/03/2020   HCT 46.9 10/03/2020   PLT 254.0 10/03/2020   GLUCOSE 97 10/03/2020   CHOL 201 (H) 10/03/2020   TRIG 90.0 10/03/2020   HDL 48.30 10/03/2020   LDLDIRECT 210.3 11/30/2013   LDLCALC 135 (H) 10/03/2020   ALT 21 10/03/2020   AST 18 10/03/2020   NA 139 10/03/2020   K 4.3 10/03/2020   CL 105 10/03/2020   CREATININE 0.90 10/03/2020   BUN 13 10/03/2020   CO2 27 10/03/2020   TSH 2.39 10/03/2020   PSA 2.81 10/19/2019   HGBA1C 6.4 10/03/2020    DG Chest 2 View  Result Date: 11/04/2018 CLINICAL DATA:  Chest pain, chest pressure. EXAM: CHEST - 2 VIEW COMPARISON:  Chest x-ray dated 02/07/2017. FINDINGS: Heart size and mediastinal contours are within normal limits. Small platelike opacity within the LEFT perihilar lung, better appreciated on the lateral projection, atelectasis versus pneumonia. RIGHT lung is clear. No pleural effusion or pneumothorax bilaterally. Mild chronic appearing vertebral body wedging at multiple levels of the thoracic spine. No acute or suspicious osseous finding. IMPRESSION: Subtle platelike opacity within the LEFT perihilar lung, atelectasis versus pneumonia, favor early developing pneumonia. Recommend follow-up chest x-ray to ensure resolution. Electronically Signed   By: Franki Cabot M.D.   On: 11/04/2018 16:15   CT Angio Chest PE W and/or Wo Contrast  Result Date: 11/04/2018 CLINICAL DATA:  Chest pain EXAM: CT ANGIOGRAPHY CHEST WITH CONTRAST TECHNIQUE: Multidetector CT imaging of the chest was performed using the standard protocol during  bolus administration of intravenous contrast. Multiplanar CT image reconstructions and MIPs were obtained to evaluate the vascular anatomy. CONTRAST:  5m OMNIPAQUE IOHEXOL 350 MG/ML SOLN COMPARISON:  None. FINDINGS: Cardiovascular: --Pulmonary arteries: Contrast injection is sufficient to demonstrate satisfactory opacification of the pulmonary arteries to the segmental level. There is no pulmonary embolus. The main pulmonary artery is within normal limits for size. --Aorta: Satisfactory opacification of the thoracic aorta. No aortic dissection or other acute aortic syndrome. Conventional 3 vessel aortic branching pattern. The aortic course and caliber are normal. There is no aortic atherosclerosis. --Heart: Normal size. No pericardial effusion. Mediastinum/Nodes: No mediastinal, hilar or axillary lymphadenopathy. The visualized thyroid and thoracic esophageal course are unremarkable. Lungs/Pleura: No pulmonary nodules or masses. No pleural effusion or pneumothorax. No focal airspace consolidation. No focal pleural abnormality. Upper Abdomen: Contrast bolus timing is not optimized for evaluation of the abdominal organs. Within this limitation, the visualized organs of the upper abdomen are normal. Musculoskeletal: No chest wall abnormality. No acute or significant osseous findings. Review of the MIP images confirms the above findings. IMPRESSION: No pulmonary embolus or other acute thoracic abnormality. Electronically Signed   By: KUlyses JarredM.D.   On: 11/04/2018 18:32       Assessment & Plan:   Problem List Items Addressed This Visit    Vitamin D deficiency, unspecified - Primary    Follow vitamin D level.       Tobacco use disorder    Discussed the need to quit smoking. He has cut back some. Plans to continue to try to decrease the amount smoking.  Follow.       Pure hypercholesterolemia    On lovastatin.  Low cholesterol diet and exercise.  Follow lipid panel and liver function tests.          Relevant Orders   Lipid  panel (Completed)   Hepatic function panel (Completed)   Hypertension    Blood pressure as outlined.  On amlodipine.  Have him spot check his pressure.  Follow pressures and metabolic panel.  Hold on making changes in medication.       Relevant Orders   CBC with Differential/Platelet (Completed)   TSH (Completed)   Basic metabolic panel (Completed)   Hyperglycemia    Low carb diet and exercise. Follow met b and a1c.       Relevant Orders   Hemoglobin A1c (Completed)   Family history of colon cancer    Last colonoscopy 2016.  Recommended f/u in 5 years.  Due f/u.       Esophageal reflux    No upper symptoms.  Nexium.        Other Visit Diagnoses    Need for immunization against influenza       Relevant Orders   Flu Vaccine QUAD 36+ mos IM (Completed)       Einar Pheasant, MD

## 2020-10-05 ENCOUNTER — Encounter: Payer: Self-pay | Admitting: Internal Medicine

## 2020-10-05 NOTE — Assessment & Plan Note (Signed)
Discussed the need to quit smoking. He has cut back some. Plans to continue to try to decrease the amount smoking.  Follow.

## 2020-10-05 NOTE — Assessment & Plan Note (Signed)
Last colonoscopy 2016.  Recommended f/u in 5 years.  Due f/u.

## 2020-10-05 NOTE — Assessment & Plan Note (Signed)
Low carb diet and exercise. Follow met b and a1c.  

## 2020-10-05 NOTE — Assessment & Plan Note (Signed)
No upper symptoms.  Nexium.  

## 2020-10-05 NOTE — Assessment & Plan Note (Signed)
Follow vitamin D level.  

## 2020-10-05 NOTE — Assessment & Plan Note (Signed)
On lovastatin.  Low cholesterol diet and exercise.  Follow lipid panel and liver function tests.   

## 2020-10-05 NOTE — Assessment & Plan Note (Signed)
Blood pressure as outlined.  On amlodipine.  Have him spot check his pressure.  Follow pressures and metabolic panel.  Hold on making changes in medication.

## 2020-11-04 ENCOUNTER — Other Ambulatory Visit: Payer: Self-pay | Admitting: Internal Medicine

## 2020-11-30 DIAGNOSIS — G243 Spasmodic torticollis: Secondary | ICD-10-CM | POA: Diagnosis not present

## 2021-02-27 ENCOUNTER — Other Ambulatory Visit: Payer: Self-pay | Admitting: Internal Medicine

## 2021-03-30 ENCOUNTER — Other Ambulatory Visit: Payer: Self-pay | Admitting: Internal Medicine

## 2021-04-28 ENCOUNTER — Other Ambulatory Visit: Payer: Self-pay | Admitting: Internal Medicine

## 2021-09-04 ENCOUNTER — Other Ambulatory Visit: Payer: Self-pay | Admitting: Internal Medicine

## 2021-11-04 ENCOUNTER — Other Ambulatory Visit: Payer: Self-pay | Admitting: Internal Medicine

## 2021-12-04 ENCOUNTER — Other Ambulatory Visit: Payer: Self-pay | Admitting: Internal Medicine

## 2022-01-22 ENCOUNTER — Encounter: Payer: Self-pay | Admitting: Family

## 2022-01-22 ENCOUNTER — Ambulatory Visit: Payer: BC Managed Care – PPO | Admitting: Family

## 2022-01-22 ENCOUNTER — Other Ambulatory Visit: Payer: Self-pay

## 2022-01-22 VITALS — BP 132/89 | HR 82 | Temp 98.4°F | Ht 66.0 in | Wt 161.0 lb

## 2022-01-22 DIAGNOSIS — L02212 Cutaneous abscess of back [any part, except buttock]: Secondary | ICD-10-CM

## 2022-01-22 MED ORDER — DOXYCYCLINE HYCLATE 100 MG PO TABS: 100.0000 mg | ORAL_TABLET | Freq: Two times a day (BID) | ORAL | 0 refills | Status: DC

## 2022-01-22 NOTE — Assessment & Plan Note (Addendum)
Chronic - reports having 5 areas surgically drained months ago, others have remained closed, but this one reopened. very inflamed, tender, and draining yellowish/brown pus, approx. 5cm area of induration.  Verbal consent obtained for manual expression of more pus, area cleaned with povidone-iodine, bacitracin ointment and dry gauze dressing applied. Pt tolerated well with minimal pain.  Instructed pt on how to change bandage daily until seen by surgeon.

## 2022-01-22 NOTE — Patient Instructions (Addendum)
It was very nice to see you today!  I have sent an antibiotic to your pharmacy, start this today.  I have sent the referral to our surgical office in [place], they will call you directly.  Change the bandage on your back once a day until seen by surgical office: 1-Clean with soap and water 2-gently squeeze any pus out using the gauze, with sanitized hands. 3-clean again with soap & water 4- apply small amount of antibiotic ointment 5- re-apply gauze and tape around edges to keep about dirt/grime.     PLEASE NOTE:  If you had any lab tests please let us know if you have not heard back within a few days. You may see your results on MyChart before we have a chance to review them but we will give you a call once they are reviewed by [place]. If we ordered any referrals today, please let us know if you have not heard from their office within the next week.   Please try these tips to maintain a healthy lifestyle:  Eat most of your calories during the day when you are active. Eliminate processed foods including packaged sweets (pies, cakes, cookies), reduce intake of potatoes, white bread, white pasta, and white rice. Look for whole grain options, oat flour or almond flour.  Each meal should contain half fruits/vegetables, one quarter protein, and one quarter carbs (no bigger than a computer mouse).  Cut down on sweet beverages. This includes juice, soda, and sweet tea. Also watch fruit intake, though this is a healthier sweet option, it still contains natural sugar! Limit to 3 servings daily.  Drink at least 1 glass of water with each meal and aim for at least 8 glasses per day  Exercise at least 150 minutes every week.

## 2022-01-22 NOTE — Progress Notes (Signed)
Subjective:     [name], male    DOB: Mar 29, 1961, 61 y.o.   MRN: [id number]  Chief Complaint  Patient presents with   Cyst    Pt states that a cyst on his back has returned. It was painful, up until it burst this week. He complains of drainage, but denies fever and chills.     HPI:  Abscess: Patient presents for evaluation of a cutaneous abscess. Lesion is located in the right upper back and center chest. Onset was  months ago. Symptoms have gradually worsened. Abscess has associated symptoms of spontaneous drainage, pain. Patient does have previous history of cutaneous abscesses. Patient does not have diabetes. pt reports having 5 cysts removed surgically a few months ago, but one has returned that has opened and draining currently. He reports another one that is not inflamed located on middle chest area.   Health Maintenance Due  Topic Date Due   HIV Screening  Never done   Hepatitis C Screening  Never done   TETANUS/TDAP  Never done   Zoster Vaccines- Shingrix (1 of 2) Never done   COLONOSCOPY (Pts 45-47yrs Insurance coverage will need to be confirmed)  02/28/2020   COVID-19 Vaccine (3 - Booster for Moderna series) 08/15/2020   INFLUENZA VACCINE  07/27/2021    Past Medical History:  Diagnosis Date   Chronic leg pain    s/p injury right leg (age 45)   GERD (gastroesophageal reflux disease)    History of ankle fracture    persistent pain   Hypercholesterolemia    Hypertension    Nephrolithiasis    followed by Assunta Gambles   Vitamin D deficiency     Past Surgical History:  Procedure Laterality Date   ABDOMINAL SURGERY     after accident.  repaired spleen   LEG SURGERY      Outpatient Medications Prior to Visit  Medication Sig Dispense Refill   amLODipine (NORVASC) 10 MG tablet TAKE 1 TABLET BY MOUTH ONCE DAILY 30 tablet 2   cholecalciferol (VITAMIN D) 400 units TABS tablet Take 400 Units by mouth.     cyclobenzaprine (FLEXERIL) 5 MG tablet Begin  only at night time, as may cause drowsiness.     esomeprazole (NEXIUM) 40 MG capsule Take 20 mg by mouth daily.     lovastatin (MEVACOR) 40 MG tablet TAKE 1 TABLET BY MOUTH AT BEDTIME 30 tablet 5   traMADol (ULTRAM) 50 MG tablet Take 1 tablet (50 mg total) by mouth daily. 30 tablet 0   Facility-Administered Medications Prior to Visit  Medication Dose Route Frequency Provider Last Rate Last Admin   betamethasone acetate-betamethasone sodium phosphate (CELESTONE) injection 3 mg  3 mg Intramuscular Once Felecia Shelling, DPM        Allergies  Allergen Reactions   Lisinopril Other (See Comments)    Fatigue all over- not able to function.   Fish Oil Rash   Simvastatin Rash        Objective:    Physical Exam Vitals and nursing note reviewed.  Constitutional:      General: He is not in acute distress.    Appearance: Normal appearance.  HENT:     Head: Normocephalic.  Cardiovascular:     Rate and Rhythm: Normal rate and regular rhythm.  Pulmonary:     Effort: Pulmonary effort is normal.     Breath sounds: Normal breath sounds.  Musculoskeletal:        General: Normal range of  motion.     Cervical back: Normal range of motion.  Skin:    General: Skin is warm and dry.     Findings: Abscess (right upper back, center chest) and erythema (back abscess, with approx. 4cm induration from 2:00-7:00) present.       Neurological:     Mental Status: He is alert and oriented to person, place, and time.  Psychiatric:        Mood and Affect: Mood normal.    BP 132/89    Pulse 82    Temp 98.4 F (36.9 C) (Temporal)    Ht 5\' 6"  (1.676 m)    Wt 161 lb (73 kg)    SpO2 98%    BMI 25.99 kg/m  Wt Readings from Last 3 Encounters:  01/22/22 161 lb (73 kg)  10/03/20 153 lb (69.4 kg)  05/02/20 148 lb (67.1 kg)       Assessment & Plan:   Problem List Items Addressed This Visit       Musculoskeletal and Integument   Cutaneous abscess of back excluding buttocks - Primary    reports having 5  areas surgically drained months ago, others have remained closed, but this one reopened. very inflamed, tender, and draining yellowish/brown pus, approx. 5cm area of induration.  Verbal consent obtained for manual expression of more pus, area cleaned with povidone-iodine, bacitracin ointment and dry gauze dressing applied. Pt tolerated well with minimal pain.  Instructed pt on how to change bandage daily until seen by surgeon.      Relevant Medications   doxycycline (VIBRA-TABS) 100 MG tablet   Other Relevant Orders   Ambulatory referral to General Surgery   PR DRAIN SKIN ABSCESS SIMPLE    Meds ordered this encounter  Medications   doxycycline (VIBRA-TABS) 100 MG tablet    Sig: Take 1 tablet (100 mg total) by mouth 2 (two) times daily.    Dispense:  20 tablet    Refill:  0    Order Specific Question:   Supervising Provider    Answer:   ANDY, CAMILLE L [[id number]]   *Extra time (07/02/20) spent with patient today which consisted of chart review, discussing diagnoses, work up, treatment (time spent draining and redressing abscess), answering questions, and documentation.

## 2022-03-04 ENCOUNTER — Other Ambulatory Visit: Payer: Self-pay | Admitting: Internal Medicine

## 2022-05-25 ENCOUNTER — Other Ambulatory Visit: Payer: Self-pay | Admitting: Internal Medicine

## 2022-06-11 ENCOUNTER — Other Ambulatory Visit: Payer: Self-pay | Admitting: Family

## 2022-06-17 ENCOUNTER — Encounter: Payer: Self-pay | Admitting: Internal Medicine

## 2022-07-09 ENCOUNTER — Ambulatory Visit: Payer: BC Managed Care – PPO | Admitting: Internal Medicine

## 2022-07-12 ENCOUNTER — Other Ambulatory Visit: Payer: Self-pay | Admitting: Internal Medicine

## 2022-07-24 ENCOUNTER — Other Ambulatory Visit: Payer: Self-pay | Admitting: Internal Medicine

## 2022-07-30 ENCOUNTER — Encounter: Payer: Self-pay | Admitting: Internal Medicine

## 2022-07-30 ENCOUNTER — Ambulatory Visit: Payer: BC Managed Care – PPO | Admitting: Internal Medicine

## 2022-07-30 VITALS — BP 126/80 | HR 74 | Temp 98.1°F | Resp 18 | Ht 66.0 in | Wt 160.4 lb

## 2022-07-30 DIAGNOSIS — Z125 Encounter for screening for malignant neoplasm of prostate: Secondary | ICD-10-CM

## 2022-07-30 DIAGNOSIS — Z800 Family history of malignant neoplasm of digestive organs: Secondary | ICD-10-CM | POA: Diagnosis not present

## 2022-07-30 DIAGNOSIS — R739 Hyperglycemia, unspecified: Secondary | ICD-10-CM | POA: Diagnosis not present

## 2022-07-30 DIAGNOSIS — I10 Essential (primary) hypertension: Secondary | ICD-10-CM | POA: Diagnosis not present

## 2022-07-30 DIAGNOSIS — K219 Gastro-esophageal reflux disease without esophagitis: Secondary | ICD-10-CM | POA: Diagnosis not present

## 2022-07-30 DIAGNOSIS — Z1211 Encounter for screening for malignant neoplasm of colon: Secondary | ICD-10-CM

## 2022-07-30 DIAGNOSIS — F17200 Nicotine dependence, unspecified, uncomplicated: Secondary | ICD-10-CM

## 2022-07-30 DIAGNOSIS — E7800 Pure hypercholesterolemia, unspecified: Secondary | ICD-10-CM

## 2022-07-30 NOTE — Progress Notes (Signed)
[name], male   DOB: [birth year], 61 y.o.   MRN: [id number]   Subjective:    [name], male    DOB: 1961-04-06, 61 y.o.   MRN: [id number]   Patient here for a scheduled follow up .   HPI Have not seen him since 2021.  Here to follow up regarding her blood pressure and cholesterol.  States he is doing relatively well.  In a new relationship.  Increased stress after his wife passed - 2019.  Doing better.  Tries to stay active.  No chest pain or sob reported.  No abdominal pain.  Bowels moving.  Describes patch of numbness - left lateral leg (upper lateral thigh).  Has quit smoking - quit 05/21/21.  Discussed lung cancer screening - CT.  Wants to hold.  Overall he feels he is doing relatively well.    Past Medical History:  Diagnosis Date   Chronic leg pain    s/p injury right leg (age 17)   GERD (gastroesophageal reflux disease)    History of ankle fracture    persistent pain   Hypercholesterolemia    Hypertension    Nephrolithiasis    followed by Thomas Harper   Vitamin D deficiency    Past Surgical History:  Procedure Laterality Date   ABDOMINAL SURGERY     after accident.  repaired spleen   LEG SURGERY     Family History  Problem Relation Age of Onset   Colon cancer Father    Heart disease Father        has a pacemaker and defibrillator   Hypertension Father    Diabetes Father    Social History   Socioeconomic History   Marital status: Married    Spouse name: Not on file   Number of children: Not on file   Years of education: Not on file   Highest education level: Not on file  Occupational History   Not on file  Tobacco Use   Smoking status: Former    Packs/day: 1.00    Types: Cigarettes    Quit date: 05/21/2021    Years since quitting: 1.1   Smokeless tobacco: Former  Substance and Sexual Activity   Alcohol use: No    Alcohol/week: 0.0 standard drinks of alcohol   Drug use: No   Sexual activity: Not on file  Other Topics  Concern   Not on file  Social History Narrative   Not on file   Social Determinants of Health   Financial Resource Strain: Not on file  Food Insecurity: Not on file  Transportation Needs: Not on file  Physical Activity: Not on file  Stress: Not on file  Social Connections: Not on file     Review of Systems  Constitutional:  Negative for appetite change and unexpected weight change.  HENT:  Negative for congestion and sinus pressure.   Respiratory:  Negative for cough, chest tightness and shortness of breath.   Cardiovascular:  Negative for chest pain, palpitations and leg swelling.  Gastrointestinal:  Negative for abdominal pain, diarrhea, nausea and vomiting.  Genitourinary:  Negative for difficulty urinating and dysuria.  Musculoskeletal:  Negative for joint swelling and myalgias.  Skin:  Negative for color change and rash.  Neurological:  Negative for dizziness, light-headedness and headaches.  Psychiatric/Behavioral:  Negative for agitation and dysphoric mood.        Objective:     BP 126/80 (BP Location: Left Arm, Patient Position: Sitting, Cuff Size:  Small)   Pulse 74   Temp 98.1 F (36.7 C) (Temporal)   Resp 18   Ht _0  (1.676 m)   Wt 160 lb 6.4 oz (72.8 kg)   SpO2 99%   BMI 25.89 kg/m  Wt Readings from Last 3 Encounters:  07/30/22 160 lb 6.4 oz (72.8 kg)  01/22/22 161 lb (73 kg)  10/03/20 153 lb (69.4 kg)    Physical Exam Constitutional:      General: He is not in acute distress.    Appearance: Normal appearance. He is well-developed.  HENT:     Head: Normocephalic and atraumatic.     Right Ear: External ear normal.     Left Ear: External ear normal.  Eyes:     General: No scleral icterus.       Right eye: No discharge.        Left eye: No discharge.  Cardiovascular:     Rate and Rhythm: Normal rate and regular rhythm.  Pulmonary:     Effort: Pulmonary effort is normal. No respiratory distress.     Breath sounds: Normal breath sounds.   Abdominal:     General: Bowel sounds are normal.     Palpations: Abdomen is soft.     Tenderness: There is no abdominal tenderness.  Musculoskeletal:        General: No swelling or tenderness.     Cervical back: Neck supple. No tenderness.  Lymphadenopathy:     Cervical: No cervical adenopathy.  Skin:    Findings: No erythema or rash.  Neurological:     Mental Status: He is alert.  Psychiatric:        Mood and Affect: Mood normal.        Behavior: Behavior normal.      Outpatient Encounter Medications as of 07/30/2022  Medication Sig   amLODipine (NORVASC) 10 MG tablet TAKE 1 TABLET BY MOUTH ONCE DAILY   cetirizine (ALLERGY, CETIRIZINE,) 10 MG tablet Take 10 mg by mouth daily.   esomeprazole (NEXIUM) 40 MG capsule Take 20 mg by mouth daily.   lovastatin (MEVACOR) 40 MG tablet TAKE 1 TABLET BY MOUTH AT BEDTIME   [DISCONTINUED] cholecalciferol (VITAMIN D) 400 units TABS tablet Take 400 Units by mouth.   [DISCONTINUED] cyclobenzaprine (FLEXERIL) 5 MG tablet Begin only at night time, as may cause drowsiness.   [DISCONTINUED] doxycycline (VIBRA-TABS) 100 MG tablet Take 1 tablet (100 mg total) by mouth 2 (two) times daily.   [DISCONTINUED] traMADol (ULTRAM) 50 MG tablet Take 1 tablet (50 mg total) by mouth daily.   [DISCONTINUED] betamethasone acetate-betamethasone sodium phosphate (CELESTONE) injection 3 mg    No facility-administered encounter medications on file as of 07/30/2022.     Lab Results  Component Value Date   WBC 6.8 10/03/2020   HGB 15.7 10/03/2020   HCT 46.9 10/03/2020   PLT 254.0 10/03/2020   GLUCOSE 97 10/03/2020   CHOL 201 (H) 10/03/2020   TRIG 90.0 10/03/2020   HDL 48.30 10/03/2020   LDLDIRECT 210.3 11/30/2013   LDLCALC 135 (H) 10/03/2020   ALT 21 10/03/2020   AST 18 10/03/2020   NA 139 10/03/2020   K 4.3 10/03/2020   CL 105 10/03/2020   CREATININE 0.90 10/03/2020   BUN 13 10/03/2020   CO2 27 10/03/2020   TSH 2.39 10/03/2020   PSA 2.81 10/19/2019    HGBA1C 6.4 10/03/2020    CT Angio Chest PE W and/or Wo Contrast  Result Date: 11/04/2018 CLINICAL DATA:  Chest pain  EXAM: CT ANGIOGRAPHY CHEST WITH CONTRAST TECHNIQUE: Multidetector CT imaging of the chest was performed using the standard protocol during bolus administration of intravenous contrast. Multiplanar CT image reconstructions and MIPs were obtained to evaluate the vascular anatomy. CONTRAST:  31m OMNIPAQUE IOHEXOL 350 MG/ML SOLN COMPARISON:  None. FINDINGS: Cardiovascular: --Pulmonary arteries: Contrast injection is sufficient to demonstrate satisfactory opacification of the pulmonary arteries to the segmental level. There is no pulmonary embolus. The main pulmonary artery is within normal limits for size. --Aorta: Satisfactory opacification of the thoracic aorta. No aortic dissection or other acute aortic syndrome. Conventional 3 vessel aortic branching pattern. The aortic course and caliber are normal. There is no aortic atherosclerosis. --Heart: Normal size. No pericardial effusion. Mediastinum/Nodes: No mediastinal, hilar or axillary lymphadenopathy. The visualized thyroid and thoracic esophageal course are unremarkable. Lungs/Pleura: No pulmonary nodules or masses. No pleural effusion or pneumothorax. No focal airspace consolidation. No focal pleural abnormality. Upper Abdomen: Contrast bolus timing is not optimized for evaluation of the abdominal organs. Within this limitation, the visualized organs of the upper abdomen are normal. Musculoskeletal: No chest wall abnormality. No acute or significant osseous findings. Review of the MIP images confirms the above findings. IMPRESSION: No pulmonary embolus or other acute thoracic abnormality. Electronically Signed   By: KUlyses JarredM.D.   On: 11/04/2018 18:32   DG Chest 2 View  Result Date: 11/04/2018 CLINICAL DATA:  Chest pain, chest pressure. EXAM: CHEST - 2 VIEW COMPARISON:  Chest x-ray dated 02/07/2017. FINDINGS: Heart size and mediastinal  contours are within normal limits. Small platelike opacity within the LEFT perihilar lung, better appreciated on the lateral projection, atelectasis versus pneumonia. RIGHT lung is clear. No pleural effusion or pneumothorax bilaterally. Mild chronic appearing vertebral body wedging at multiple levels of the thoracic spine. No acute or suspicious osseous finding. IMPRESSION: Subtle platelike opacity within the LEFT perihilar lung, atelectasis versus pneumonia, favor early developing pneumonia. Recommend follow-up chest x-ray to ensure resolution. Electronically Signed   By: SFranki CabotM.D.   On: 11/04/2018 16:15       Assessment & Plan:   Problem List Items Addressed This Visit     Esophageal reflux    No upper symptoms.  Nexium.       Family history of colon cancer    Last colonoscopy 2016.  Recommended f/u in 5 years.  Overdue f/u.  Refer back to GI      Relevant Orders   Ambulatory referral to Gastroenterology   Hyperglycemia    Low carb diet and exercise. Follow met b and a1c.       Relevant Orders   Hemoglobin A1c   Hypertension    Blood pressure as outlined.  On amlodipine.  Have him spot check his pressure.  Follow pressures and metabolic panel.        Relevant Orders   Basic metabolic panel   Pure hypercholesterolemia    On lovastatin.  Low cholesterol diet and exercise.  Follow lipid panel and liver function tests.        Relevant Orders   CBC with Differential/Platelet   Lipid panel   TSH   Hepatic function panel   Tobacco use disorder    Has quit smoking.  Last 05/21/21.  Doing well.  Discussed screening CT. Wants to hold.  Follow.       Other Visit Diagnoses     Prostate cancer screening    -  Primary   Relevant Orders   PSA   Colon cancer  screening       Relevant Orders   Ambulatory referral to Gastroenterology        Einar Pheasant, MD

## 2022-08-01 ENCOUNTER — Encounter: Payer: Self-pay | Admitting: Internal Medicine

## 2022-08-01 NOTE — Assessment & Plan Note (Signed)
Low carb diet and exercise. Follow met b and a1c.  

## 2022-08-01 NOTE — Assessment & Plan Note (Signed)
No upper symptoms.  Nexium.  

## 2022-08-01 NOTE — Assessment & Plan Note (Signed)
Last colonoscopy 2016.  Recommended f/u in 5 years.  Overdue f/u.  Refer back to GI

## 2022-08-01 NOTE — Assessment & Plan Note (Signed)
Has quit smoking.  Last 05/21/21.  Doing well.  Discussed screening CT. Wants to hold.  Follow.  

## 2022-08-01 NOTE — Assessment & Plan Note (Signed)
On lovastatin.  Low cholesterol diet and exercise.  Follow lipid panel and liver function tests.   

## 2022-08-01 NOTE — Assessment & Plan Note (Addendum)
Blood pressure as outlined.  On amlodipine.  Have him spot check his pressure.  Follow pressures and metabolic panel.

## 2022-08-06 ENCOUNTER — Other Ambulatory Visit (INDEPENDENT_AMBULATORY_CARE_PROVIDER_SITE_OTHER): Payer: BC Managed Care – PPO

## 2022-08-06 DIAGNOSIS — E7800 Pure hypercholesterolemia, unspecified: Secondary | ICD-10-CM | POA: Diagnosis not present

## 2022-08-06 DIAGNOSIS — I10 Essential (primary) hypertension: Secondary | ICD-10-CM

## 2022-08-06 DIAGNOSIS — R739 Hyperglycemia, unspecified: Secondary | ICD-10-CM | POA: Diagnosis not present

## 2022-08-06 DIAGNOSIS — Z125 Encounter for screening for malignant neoplasm of prostate: Secondary | ICD-10-CM | POA: Diagnosis not present

## 2022-08-06 LAB — CBC WITH DIFFERENTIAL/PLATELET
Basophils Absolute: 0 10*3/uL (ref 0.0–0.1)
Basophils Relative: 0.6 % (ref 0.0–3.0)
Eosinophils Absolute: 0.1 10*3/uL (ref 0.0–0.7)
Eosinophils Relative: 2 % (ref 0.0–5.0)
HCT: 43.6 % (ref 39.0–52.0)
Hemoglobin: 14.4 g/dL (ref 13.0–17.0)
Lymphocytes Relative: 25.1 % (ref 12.0–46.0)
Lymphs Abs: 1.4 10*3/uL (ref 0.7–4.0)
MCHC: 33.1 g/dL (ref 30.0–36.0)
MCV: 89.3 fl (ref 78.0–100.0)
Monocytes Absolute: 0.5 10*3/uL (ref 0.1–1.0)
Monocytes Relative: 8.4 % (ref 3.0–12.0)
Neutro Abs: 3.7 10*3/uL (ref 1.4–7.7)
Neutrophils Relative %: 63.9 % (ref 43.0–77.0)
Platelets: 265 10*3/uL (ref 150.0–400.0)
RBC: 4.88 Mil/uL (ref 4.22–5.81)
RDW: 14 % (ref 11.5–15.5)
WBC: 5.8 10*3/uL (ref 4.0–10.5)

## 2022-08-06 LAB — TSH: TSH: 2.45 u[IU]/mL (ref 0.35–5.50)

## 2022-08-06 LAB — PSA: PSA: 1.89 ng/mL (ref 0.10–4.00)

## 2022-08-06 LAB — HEMOGLOBIN A1C: Hgb A1c MFr Bld: 6.2 % (ref 4.6–6.5)

## 2022-08-06 LAB — LIPID PANEL
Cholesterol: 226 mg/dL — ABNORMAL HIGH (ref 0–200)
HDL: 52.1 mg/dL (ref >39.00)
LDL Cholesterol: 153 mg/dL — ABNORMAL HIGH (ref 0–99)
NonHDL: 173.95
Total CHOL/HDL Ratio: 4
Triglycerides: 106 mg/dL (ref 0.0–149.0)
VLDL: 21.2 mg/dL (ref 0.0–40.0)

## 2022-08-06 LAB — BASIC METABOLIC PANEL
BUN: 16 mg/dL (ref 6–23)
CO2: 26 mEq/L (ref 19–32)
Calcium: 9.2 mg/dL (ref 8.4–10.5)
Chloride: 103 mEq/L (ref 96–112)
Creatinine, Ser: 0.89 mg/dL (ref 0.40–1.50)
GFR: 92.69 mL/min (ref >60.00)
Glucose, Bld: 100 mg/dL — ABNORMAL HIGH (ref 70–99)
Potassium: 4.3 mEq/L (ref 3.5–5.1)
Sodium: 138 mEq/L (ref 135–145)

## 2022-08-06 LAB — HEPATIC FUNCTION PANEL
ALT: 28 U/L (ref 0–53)
AST: 22 U/L (ref 0–37)
Albumin: 4.4 g/dL (ref 3.5–5.2)
Alkaline Phosphatase: 70 U/L (ref 39–117)
Bilirubin, Direct: 0.1 mg/dL (ref 0.0–0.3)
Total Bilirubin: 0.5 mg/dL (ref 0.2–1.2)
Total Protein: 6.8 g/dL (ref 6.0–8.3)

## 2022-08-10 ENCOUNTER — Other Ambulatory Visit: Payer: Self-pay | Admitting: Internal Medicine

## 2022-08-10 NOTE — Progress Notes (Signed)
Opened in error

## 2022-08-11 ENCOUNTER — Other Ambulatory Visit: Payer: Self-pay | Admitting: Internal Medicine

## 2022-08-11 ENCOUNTER — Other Ambulatory Visit: Payer: Self-pay

## 2022-08-11 ENCOUNTER — Telehealth: Payer: Self-pay

## 2022-08-11 MED ORDER — REPATHA SURECLICK 140 MG/ML ~~LOC~~ SOAJ: 140.0000 mg | SUBCUTANEOUS | 1 refills | Status: DC

## 2022-08-11 NOTE — Telephone Encounter (Signed)
Initiated PA via covermymeds on 08/11/22 for pt's Repatha SureClick 140MG /ML auto-injectors   Rx #:[id number]  Awaiting denial or approval.

## 2022-08-11 NOTE — Progress Notes (Signed)
Per result note Repatha 140 mg has been sent to [facility] for pt to give every 2 wks.

## 2022-08-13 ENCOUNTER — Ambulatory Visit (INDEPENDENT_AMBULATORY_CARE_PROVIDER_SITE_OTHER): Payer: BC Managed Care – PPO

## 2022-08-13 DIAGNOSIS — E7800 Pure hypercholesterolemia, unspecified: Secondary | ICD-10-CM

## 2022-08-13 NOTE — Progress Notes (Signed)
Patient came in today for Repatha teaching. It was demonstrated to patient with training pen:  Patient instructed to clean area with alcohol swab in circular motion Remove orange cap from pen Hold pen up to subcutaneous area (preferably back of thigh) & push in gray button the bottom of th pen. Window should be clear & once he hears click yellow line should start to appear When he hears second click then injection is done & he can discard pen]]  Patient was able to demonstrate back to me & was comfortable giving injection. If he has further questions he will call office. He is waiting on PA & he will start with the injection.

## 2022-08-16 ENCOUNTER — Telehealth: Payer: Self-pay | Admitting: Internal Medicine

## 2022-08-16 NOTE — Telephone Encounter (Signed)
I called patient to clarify this message. He stated that when he saw you he mentioned how bad his allergies are & how the OTC meds are just not working. He said that he was having his nose run all over & non-stop today. He said that at night his nose stops completely up. I advised that I could schedule him for evaluation but he said that he was just seen & this was mentioned. He does not want to pay again & just wants something called in to help him. I advised that I would send message but he really needed to be revaluated to determine if possible he had a sinus infection, needed nasal spray etc.

## 2022-08-16 NOTE — Telephone Encounter (Signed)
Patient forgot to talk to Dr Lorin Picket about his allergies, over the counter is not working at all for him. Please send to the Pharmacy at [facility] [place].

## 2022-08-17 ENCOUNTER — Other Ambulatory Visit: Payer: Self-pay

## 2022-08-17 MED ORDER — AZELASTINE HCL 0.1 % NA SOLN: 1.0000 | Freq: Two times a day (BID) | NASAL | 1 refills | Status: DC

## 2022-08-17 NOTE — Telephone Encounter (Signed)
FYI patient has no SOB, cough, no sinus pressure. Only runny nose during the day & nasal congestion at night. Pt was agreeable to trying nsal sprays & I have sent to his pharmacy. He knows that if no improvement then to let us know & he will need to be evaluated for this.

## 2022-08-17 NOTE — Telephone Encounter (Signed)
Please confirm what symptoms he is having.  Confirm no fever, no sinus pressure.  Confirm having runny nose and some nasal congestion at night with no chest congestion, cough or sob.  If just runny nose and nasal congestion, would recommend nasacort nasal spray - 2 sprays each nostril one time per day.  Do this in the evening.  Can also send in rx for astelin nasal spray - one spray each nostril bid.  If persistent symptoms, will need to be evaluated.  (Confirm no known allergy to nasacort or astelin nasal spray).

## 2022-08-18 ENCOUNTER — Telehealth: Payer: Self-pay

## 2022-08-18 MED ORDER — REPATHA SURECLICK 140 MG/ML ~~LOC~~ SOAJ: 140.0000 mg | SUBCUTANEOUS | 1 refills | Status: DC

## 2022-08-18 NOTE — Telephone Encounter (Signed)
I called to let patient know that Repatha was approved by insurance. I have resent to [facility] in [place] for patient.

## 2022-08-31 NOTE — Telephone Encounter (Signed)
Pt called stating he was prescribed repatha and he has no energy from taking it. Pt is going to continue to take his regular medicine and watch his diet

## 2022-09-01 NOTE — Telephone Encounter (Signed)
Noted.  Please call and confirm no other symptoms.  If any acute symptoms, let me know. Will need to let us know if symptoms do not improve.

## 2022-09-01 NOTE — Telephone Encounter (Signed)
Lvm for patient to call back.  Kaydon Husby,cma  

## 2022-09-02 ENCOUNTER — Telehealth: Payer: Self-pay | Admitting: *Deleted

## 2022-09-02 NOTE — Telephone Encounter (Signed)
Received a notification that Repatha was denied by [facility].  Reason for Denial: The request does not meet the definition of medical necessity found in the member's benefit booklet.

## 2022-09-02 NOTE — Telephone Encounter (Signed)
Lvm to call back to office 

## 2022-09-03 NOTE — Telephone Encounter (Signed)
Per previous note, he tried repatha and did not tolerate.  See previous phone note.  Back on statin medication.  Working on diet and exercise.  Will follow lipid panel.

## 2022-09-03 NOTE — Telephone Encounter (Signed)
Lvm for pt to return call.

## 2022-09-06 NOTE — Telephone Encounter (Signed)
Called pt x2 lvm x2 for pt to return call. 

## 2022-09-09 ENCOUNTER — Other Ambulatory Visit: Payer: Self-pay | Admitting: Internal Medicine

## 2022-09-09 NOTE — Telephone Encounter (Signed)
Rx sent pt aware 

## 2022-10-29 ENCOUNTER — Other Ambulatory Visit: Payer: Self-pay

## 2022-10-29 ENCOUNTER — Telehealth: Payer: Self-pay | Admitting: Internal Medicine

## 2022-10-29 MED ORDER — AMLODIPINE BESYLATE 10 MG PO TABS: 10.0000 mg | ORAL_TABLET | Freq: Every day | ORAL | 2 refills | Status: DC

## 2022-10-29 MED ORDER — LOVASTATIN 40 MG PO TABS: 40.0000 mg | ORAL_TABLET | Freq: Every day | ORAL | 0 refills | Status: DC

## 2022-11-12 ENCOUNTER — Encounter: Payer: Self-pay | Admitting: Internal Medicine

## 2022-11-12 ENCOUNTER — Ambulatory Visit (INDEPENDENT_AMBULATORY_CARE_PROVIDER_SITE_OTHER): Payer: BC Managed Care – PPO | Admitting: Internal Medicine

## 2022-11-12 ENCOUNTER — Ambulatory Visit (INDEPENDENT_AMBULATORY_CARE_PROVIDER_SITE_OTHER): Payer: 59

## 2022-11-12 VITALS — BP 134/80 | HR 83 | Temp 97.9°F | Resp 18 | Ht 66.0 in | Wt 162.4 lb

## 2022-11-12 DIAGNOSIS — R739 Hyperglycemia, unspecified: Secondary | ICD-10-CM

## 2022-11-12 DIAGNOSIS — Z23 Encounter for immunization: Secondary | ICD-10-CM

## 2022-11-12 DIAGNOSIS — R0683 Snoring: Secondary | ICD-10-CM

## 2022-11-12 DIAGNOSIS — R0602 Shortness of breath: Secondary | ICD-10-CM | POA: Diagnosis not present

## 2022-11-12 DIAGNOSIS — I10 Essential (primary) hypertension: Secondary | ICD-10-CM

## 2022-11-12 DIAGNOSIS — Z800 Family history of malignant neoplasm of digestive organs: Secondary | ICD-10-CM

## 2022-11-12 DIAGNOSIS — Z0000 Encounter for general adult medical examination without abnormal findings: Secondary | ICD-10-CM

## 2022-11-12 DIAGNOSIS — Z0001 Encounter for general adult medical examination with abnormal findings: Secondary | ICD-10-CM

## 2022-11-12 DIAGNOSIS — K219 Gastro-esophageal reflux disease without esophagitis: Secondary | ICD-10-CM

## 2022-11-12 DIAGNOSIS — E7800 Pure hypercholesterolemia, unspecified: Secondary | ICD-10-CM

## 2022-11-12 LAB — HEPATIC FUNCTION PANEL
ALT: 29 U/L (ref 0–53)
AST: 19 U/L (ref 0–37)
Albumin: 4.4 g/dL (ref 3.5–5.2)
Alkaline Phosphatase: 68 U/L (ref 39–117)
Bilirubin, Direct: 0.1 mg/dL (ref 0.0–0.3)
Total Bilirubin: 0.4 mg/dL (ref 0.2–1.2)
Total Protein: 6.8 g/dL (ref 6.0–8.3)

## 2022-11-12 LAB — LIPID PANEL
Cholesterol: 199 mg/dL (ref 0–200)
HDL: 55.4 mg/dL (ref >39.00)
LDL Cholesterol: 129 mg/dL — ABNORMAL HIGH (ref 0–99)
NonHDL: 143.94
Total CHOL/HDL Ratio: 4
Triglycerides: 73 mg/dL (ref 0.0–149.0)
VLDL: 14.6 mg/dL (ref 0.0–40.0)

## 2022-11-12 LAB — BASIC METABOLIC PANEL
BUN: 20 mg/dL (ref 6–23)
CO2: 26 mEq/L (ref 19–32)
Calcium: 9.1 mg/dL (ref 8.4–10.5)
Chloride: 105 mEq/L (ref 96–112)
Creatinine, Ser: 0.89 mg/dL (ref 0.40–1.50)
GFR: 92.51 mL/min (ref >60.00)
Glucose, Bld: 102 mg/dL — ABNORMAL HIGH (ref 70–99)
Potassium: 4.1 mEq/L (ref 3.5–5.1)
Sodium: 138 mEq/L (ref 135–145)

## 2022-11-12 MED ORDER — LOVASTATIN 40 MG PO TABS: 40.0000 mg | ORAL_TABLET | Freq: Every day | ORAL | 1 refills | Status: DC

## 2022-11-12 MED ORDER — AMLODIPINE BESYLATE 10 MG PO TABS: 10.0000 mg | ORAL_TABLET | Freq: Every day | ORAL | 1 refills | Status: DC

## 2022-11-12 NOTE — Assessment & Plan Note (Addendum)
Physical today 11/12/22.  Colonoscopy 2016 - recommended f/u 5 years.  Referred to GI last visit.  PSA 08/06/22 - 1.89.

## 2022-11-12 NOTE — Progress Notes (Unsigned)
[name], male   DOB: 1961-10-23, 61 y.o.   MRN: [id number]   Subjective:    [name], male    DOB: 1961-03-27, 61 y.o.   MRN: [id number]   Patient here for  Chief Complaint  Patient presents with   Annual Exam    CPE   .   HPI Here for physical. States he is doing relatively well.  Working.  Some increased fatigue.  No chest pain or sob reported.  Taking lovastatin.  Did not tolerate repatha.  Made him feel like he had the flu.  On nexium.  Controls acid reflux if takes regularly. If he misses two days of taking nexium, symptoms return. Referred to GI last visit - colon screening. Has appt next week.  Plans to discuss upper symptoms as well.  No abdominal pain.  Bowels moving.  Discussed snoring and sleep apnea.  Declines evaluation at this time.      Past Medical History:  Diagnosis Date   Chronic leg pain    s/p injury right leg (age 37)   GERD (gastroesophageal reflux disease)    History of ankle fracture    persistent pain   Hypercholesterolemia    Hypertension    Nephrolithiasis    followed by Edrick Oh   Vitamin D deficiency    Past Surgical History:  Procedure Laterality Date   ABDOMINAL SURGERY     after accident.  repaired spleen   LEG SURGERY     Family History  Problem Relation Age of Onset   Colon cancer Father    Heart disease Father        has a pacemaker and defibrillator   Hypertension Father    Diabetes Father    Social History   Socioeconomic History   Marital status: Married    Spouse name: Not on file   Number of children: Not on file   Years of education: Not on file   Highest education level: Not on file  Occupational History   Not on file  Tobacco Use   Smoking status: Former    Packs/day: 1.00    Types: Cigarettes    Quit date: 05/21/2021    Years since quitting: 1.4   Smokeless tobacco: Former  Substance and Sexual Activity   Alcohol use: No    Alcohol/week: 0.0 standard drinks of alcohol    Drug use: No   Sexual activity: Not on file  Other Topics Concern   Not on file  Social History Narrative   Not on file   Social Determinants of Health   Financial Resource Strain: Not on file  Food Insecurity: Not on file  Transportation Needs: Not on file  Physical Activity: Not on file  Stress: Not on file  Social Connections: Not on file     Review of Systems  Constitutional:  Positive for fatigue. Negative for appetite change and unexpected weight change.  HENT:  Negative for congestion, sinus pressure and sore throat.   Eyes:  Negative for pain and visual disturbance.  Respiratory:  Negative for cough, chest tightness and shortness of breath.   Cardiovascular:  Negative for chest pain, palpitations and leg swelling.  Gastrointestinal:  Negative for abdominal pain, diarrhea, nausea and vomiting.  Genitourinary:  Negative for difficulty urinating and dysuria.  Musculoskeletal:  Negative for joint swelling and myalgias.  Skin:  Negative for color change and rash.  Neurological:  Negative for dizziness, light-headedness and headaches.  Hematological:  Negative  for adenopathy. Does not bruise/bleed easily.  Psychiatric/Behavioral:  Negative for agitation and dysphoric mood.        Objective:     BP 134/80 (BP Location: Left Arm, Patient Position: Sitting, Cuff Size: Small)   Pulse 83   Temp 97.9 F (36.6 C) (Temporal)   Resp 18   Ht 5' 6" (1.676 m)   Wt 162 lb 6.4 oz (73.7 kg)   SpO2 96%   BMI 26.21 kg/m  Wt Readings from Last 3 Encounters:  11/12/22 162 lb 6.4 oz (73.7 kg)  07/30/22 160 lb 6.4 oz (72.8 kg)  01/22/22 161 lb (73 kg)    Physical Exam Constitutional:      General: He is not in acute distress.    Appearance: Normal appearance. He is well-developed.  HENT:     Head: Normocephalic and atraumatic.     Right Ear: External ear normal.     Left Ear: External ear normal.  Eyes:     General: No scleral icterus.       Right eye: No discharge.         Left eye: No discharge.     Conjunctiva/sclera: Conjunctivae normal.  Neck:     Thyroid: No thyromegaly.  Cardiovascular:     Rate and Rhythm: Normal rate and regular rhythm.  Pulmonary:     Effort: No respiratory distress.     Breath sounds: Normal breath sounds. No wheezing.  Abdominal:     General: Bowel sounds are normal.     Palpations: Abdomen is soft.     Tenderness: There is no abdominal tenderness.  Musculoskeletal:        General: No swelling or tenderness.     Cervical back: Neck supple. No tenderness.  Lymphadenopathy:     Cervical: No cervical adenopathy.  Skin:    Findings: No erythema or rash.  Neurological:     Mental Status: He is alert and oriented to person, place, and time.  Psychiatric:        Mood and Affect: Mood normal.        Behavior: Behavior normal.      Outpatient Encounter Medications as of 11/12/2022  Medication Sig   azelastine (ASTELIN) 0.1 % nasal spray Place 1 spray into both nostrils 2 (two) times daily. Use in each nostril as directed   cetirizine (ALLERGY, CETIRIZINE,) 10 MG tablet Take 10 mg by mouth daily.   esomeprazole (NEXIUM) 40 MG capsule Take 20 mg by mouth daily.   [DISCONTINUED] amLODipine (NORVASC) 10 MG tablet Take 1 tablet (10 mg total) by mouth daily.   [DISCONTINUED] lovastatin (MEVACOR) 40 MG tablet Take 1 tablet (40 mg total) by mouth at bedtime.   amLODipine (NORVASC) 10 MG tablet Take 1 tablet (10 mg total) by mouth daily.   lovastatin (MEVACOR) 40 MG tablet Take 1 tablet (40 mg total) by mouth at bedtime.   [DISCONTINUED] Evolocumab (REPATHA SURECLICK) 500 MG/ML SOAJ Inject 140 mg into the skin every 14 (fourteen) days.   No facility-administered encounter medications on file as of 11/12/2022.     Lab Results  Component Value Date   WBC 5.8 08/06/2022   HGB 14.4 08/06/2022   HCT 43.6 08/06/2022   PLT 265.0 08/06/2022   GLUCOSE 102 (H) 11/12/2022   CHOL 199 11/12/2022   TRIG 73.0 11/12/2022   HDL 55.40  11/12/2022   LDLDIRECT 210.3 11/30/2013   LDLCALC 129 (H) 11/12/2022   ALT 29 11/12/2022   AST 19 11/12/2022  NA 138 11/12/2022   K 4.1 11/12/2022   CL 105 11/12/2022   CREATININE 0.89 11/12/2022   BUN 20 11/12/2022   CO2 26 11/12/2022   TSH 2.45 08/06/2022   PSA 1.89 08/06/2022   HGBA1C 6.2 08/06/2022    CT Angio Chest PE W and/or Wo Contrast  Result Date: 11/04/2018 CLINICAL DATA:  Chest pain EXAM: CT ANGIOGRAPHY CHEST WITH CONTRAST TECHNIQUE: Multidetector CT imaging of the chest was performed using the standard protocol during bolus administration of intravenous contrast. Multiplanar CT image reconstructions and MIPs were obtained to evaluate the vascular anatomy. CONTRAST:  61m OMNIPAQUE IOHEXOL 350 MG/ML SOLN COMPARISON:  None. FINDINGS: Cardiovascular: --Pulmonary arteries: Contrast injection is sufficient to demonstrate satisfactory opacification of the pulmonary arteries to the segmental level. There is no pulmonary embolus. The main pulmonary artery is within normal limits for size. --Aorta: Satisfactory opacification of the thoracic aorta. No aortic dissection or other acute aortic syndrome. Conventional 3 vessel aortic branching pattern. The aortic course and caliber are normal. There is no aortic atherosclerosis. --Heart: Normal size. No pericardial effusion. Mediastinum/Nodes: No mediastinal, hilar or axillary lymphadenopathy. The visualized thyroid and thoracic esophageal course are unremarkable. Lungs/Pleura: No pulmonary nodules or masses. No pleural effusion or pneumothorax. No focal airspace consolidation. No focal pleural abnormality. Upper Abdomen: Contrast bolus timing is not optimized for evaluation of the abdominal organs. Within this limitation, the visualized organs of the upper abdomen are normal. Musculoskeletal: No chest wall abnormality. No acute or significant osseous findings. Review of the MIP images confirms the above findings. IMPRESSION: No pulmonary embolus or  other acute thoracic abnormality. Electronically Signed   By: KUlyses JarredM.D.   On: 11/04/2018 18:32   DG Chest 2 View  Result Date: 11/04/2018 CLINICAL DATA:  Chest pain, chest pressure. EXAM: CHEST - 2 VIEW COMPARISON:  Chest x-ray dated 02/07/2017. FINDINGS: Heart size and mediastinal contours are within normal limits. Small platelike opacity within the LEFT perihilar lung, better appreciated on the lateral projection, atelectasis versus pneumonia. RIGHT lung is clear. No pleural effusion or pneumothorax bilaterally. Mild chronic appearing vertebral body wedging at multiple levels of the thoracic spine. No acute or suspicious osseous finding. IMPRESSION: Subtle platelike opacity within the LEFT perihilar lung, atelectasis versus pneumonia, favor early developing pneumonia. Recommend follow-up chest x-ray to ensure resolution. Electronically Signed   By: SFranki CabotM.D.   On: 11/04/2018 16:15       Assessment & Plan:   Problem List Items Addressed This Visit     Esophageal reflux    If takes nexium regularly - symptoms controlled.  If misses two days, they symptoms return.  Has appt with GI next week.  Plans to discuss with them regarding question of need for EGD.        Family history of colon cancer    Last colonoscopy 2016.  Recommended f/u in 5 years.  Overdue f/u.  Referred back to GI for f/u colonoscopy.  Has appt next week.       Health care maintenance    Physical today 11/12/22.  Colonoscopy 2016 - recommended f/u 5 years.  Referred to GI last visit.  Has GI appt next week.  PSA 08/06/22 - 1.89.       Hyperglycemia    Low carb diet and exercise. Follow met b and a1c.       Hypertension    Blood pressure as outlined.  On amlodipine.  Blood pressure elevated above goal. Have him spot check his  pressure.  Follow pressures and metabolic panel.  Send in readings.        Relevant Medications   amLODipine (NORVASC) 10 MG tablet   lovastatin (MEVACOR) 40 MG tablet   Other  Relevant Orders   Basic Metabolic Panel (BMET) (Completed)   Pure hypercholesterolemia    On lovastatin.  Did not tolerate repatha. Low cholesterol diet and exercise.  Follow lipid panel and liver function tests.        Relevant Medications   amLODipine (NORVASC) 10 MG tablet   lovastatin (MEVACOR) 40 MG tablet   Other Relevant Orders   Lipid Profile (Completed)   Hepatic function panel (Completed)   Snoring    Reports fatigue.  Snores. Discussed sleep.  Discussed checking routine labs.  Discussed sleep study.  Declines at this time.  Will notify me if changes his mind.        SOB (shortness of breath)    Reported noticing some sob with exertion.  Discussed EKG and further cardiac w/up.  Declines.  Agreed to cxr.  Overall feels breathing is stable.       Relevant Orders   DG Chest 2 View   Other Visit Diagnoses     Routine general medical examination at a health care facility    -  Primary   Need for influenza vaccination       Relevant Orders   Flu Vaccine QUAD 6+ mos PF IM (Fluarix Quad PF) (Completed)        Einar Pheasant, MD

## 2022-11-13 ENCOUNTER — Encounter: Payer: Self-pay | Admitting: Internal Medicine

## 2022-11-13 DIAGNOSIS — R0683 Snoring: Secondary | ICD-10-CM | POA: Insufficient documentation

## 2022-11-13 NOTE — Assessment & Plan Note (Signed)
On lovastatin.  Did not tolerate repatha. Low cholesterol diet and exercise.  Follow lipid panel and liver function tests.   

## 2022-11-13 NOTE — Assessment & Plan Note (Addendum)
Reported noticing some sob with exertion.  Discussed EKG and further cardiac w/up.  Declines.  Agreed to cxr.  Overall feels breathing is stable.

## 2022-11-13 NOTE — Assessment & Plan Note (Signed)
Low carb diet and exercise. Follow met b and a1c.  

## 2022-11-13 NOTE — Assessment & Plan Note (Addendum)
Last colonoscopy 2016.  Recommended f/u in 5 years.  Overdue f/u.  Referred back to GI for f/u colonoscopy.  Has appt next week.

## 2022-11-13 NOTE — Assessment & Plan Note (Signed)
Reports fatigue.  Snores. Discussed sleep.  Discussed checking routine labs.  Discussed sleep study.  Declines at this time.  Will notify me if changes his mind.

## 2022-11-13 NOTE — Assessment & Plan Note (Signed)
Blood pressure as outlined.  On amlodipine.  Blood pressure elevated above goal. Have him spot check his pressure.  Follow pressures and metabolic panel.  Send in readings.

## 2022-11-13 NOTE — Assessment & Plan Note (Signed)
If takes nexium regularly - symptoms controlled.  If misses two days, they symptoms return.  Has appt with GI next week.  Plans to discuss with them regarding question of need for EGD.

## 2022-12-27 HISTORY — PX: TRANSESOPHAGEAL ECHOCARDIOGRAM: EP1259

## 2023-01-20 LAB — HM COLONOSCOPY

## 2023-03-03 ENCOUNTER — Other Ambulatory Visit: Payer: Self-pay | Admitting: Internal Medicine

## 2023-03-18 ENCOUNTER — Encounter: Payer: Self-pay | Admitting: Internal Medicine

## 2023-03-18 ENCOUNTER — Ambulatory Visit: Payer: 59 | Admitting: Internal Medicine

## 2023-03-18 VITALS — BP 138/78 | HR 78 | Temp 98.0°F | Resp 16 | Ht 66.0 in | Wt 166.6 lb

## 2023-03-18 DIAGNOSIS — R739 Hyperglycemia, unspecified: Secondary | ICD-10-CM | POA: Diagnosis not present

## 2023-03-18 DIAGNOSIS — I10 Essential (primary) hypertension: Secondary | ICD-10-CM | POA: Diagnosis not present

## 2023-03-18 DIAGNOSIS — B079 Viral wart, unspecified: Secondary | ICD-10-CM

## 2023-03-18 DIAGNOSIS — F17200 Nicotine dependence, unspecified, uncomplicated: Secondary | ICD-10-CM

## 2023-03-18 DIAGNOSIS — E7800 Pure hypercholesterolemia, unspecified: Secondary | ICD-10-CM | POA: Diagnosis not present

## 2023-03-18 LAB — HEPATIC FUNCTION PANEL
ALT: 25 U/L (ref 0–53)
AST: 21 U/L (ref 0–37)
Albumin: 4.5 g/dL (ref 3.5–5.2)
Alkaline Phosphatase: 72 U/L (ref 39–117)
Bilirubin, Direct: 0.1 mg/dL (ref 0.0–0.3)
Total Bilirubin: 0.5 mg/dL (ref 0.2–1.2)
Total Protein: 7 g/dL (ref 6.0–8.3)

## 2023-03-18 LAB — BASIC METABOLIC PANEL
BUN: 15 mg/dL (ref 6–23)
CO2: 28 mEq/L (ref 19–32)
Calcium: 9.4 mg/dL (ref 8.4–10.5)
Chloride: 103 mEq/L (ref 96–112)
Creatinine, Ser: 0.93 mg/dL (ref 0.40–1.50)
GFR: 88.43 mL/min (ref >60.00)
Glucose, Bld: 107 mg/dL — ABNORMAL HIGH (ref 70–99)
Potassium: 4.2 mEq/L (ref 3.5–5.1)
Sodium: 140 mEq/L (ref 135–145)

## 2023-03-18 LAB — HEMOGLOBIN A1C: Hgb A1c MFr Bld: 6.2 % (ref 4.6–6.5)

## 2023-03-18 LAB — LIPID PANEL
Cholesterol: 213 mg/dL — ABNORMAL HIGH (ref 0–200)
HDL: 53.8 mg/dL (ref >39.00)
LDL Cholesterol: 137 mg/dL — ABNORMAL HIGH (ref 0–99)
NonHDL: 159.51
Total CHOL/HDL Ratio: 4
Triglycerides: 115 mg/dL (ref 0.0–149.0)
VLDL: 23 mg/dL (ref 0.0–40.0)

## 2023-03-18 MED ORDER — LOVASTATIN 40 MG PO TABS: 40.0000 mg | ORAL_TABLET | Freq: Every day | ORAL | 3 refills | Status: DC

## 2023-03-18 MED ORDER — AMLODIPINE BESYLATE 10 MG PO TABS: 10.0000 mg | ORAL_TABLET | Freq: Every day | ORAL | 1 refills | Status: DC

## 2023-03-18 NOTE — Progress Notes (Unsigned)
Subjective:    [name], male    DOB: 1961/04/04, 62 y.o.   MRN: [id number]  Patient here for  Chief Complaint  Patient presents with   Medical Management of Chronic Issues    HPI Here to follow up regarding hypercholesterolemia, hypertension and hyperglycemia. Saw GI 11/16/22 - recommended EGD and colonoscopy.  Colonoscopy - small TA.  Recommended f/u colonoscopy in 5 years. Doing well.  No acid reflux.  On nexium.  No chest pain.  Breathing is better.  No smoking for two years.  No abdominal pain.  Bowels moving.    Past Medical History:  Diagnosis Date   Chronic leg pain    s/p injury right leg (age 72)   GERD (gastroesophageal reflux disease)    History of ankle fracture    persistent pain   Hypercholesterolemia    Hypertension    Nephrolithiasis    followed by Edrick Oh   Vitamin D deficiency    Past Surgical History:  Procedure Laterality Date   ABDOMINAL SURGERY     after accident.  repaired spleen   LEG SURGERY     Family History  Problem Relation Age of Onset   Colon cancer Father    Heart disease Father        has a pacemaker and defibrillator   Hypertension Father    Diabetes Father    Social History   Socioeconomic History   Marital status: Married    Spouse name: Not on file   Number of children: Not on file   Years of education: Not on file   Highest education level: Not on file  Occupational History   Not on file  Tobacco Use   Smoking status: Former    Packs/day: 1    Types: Cigarettes    Quit date: 05/21/2021    Years since quitting: 1.8   Smokeless tobacco: Former  Substance and Sexual Activity   Alcohol use: No    Alcohol/week: 0.0 standard drinks of alcohol   Drug use: No   Sexual activity: Not on file  Other Topics Concern   Not on file  Social History Narrative   Not on file   Social Determinants of Health   Financial Resource Strain: Not on file  Food Insecurity: Not on file  Transportation Needs: Not  on file  Physical Activity: Not on file  Stress: Not on file  Social Connections: Not on file     Review of Systems     Objective:     BP 138/78   Pulse 78   Temp 98 F (36.7 C)   Resp 16   Ht 5\' 6"  (1.676 m)   Wt 166 lb 9.6 oz (75.6 kg)   SpO2 98%   BMI 26.89 kg/m  Wt Readings from Last 3 Encounters:  03/18/23 166 lb 9.6 oz (75.6 kg)  11/12/22 162 lb 6.4 oz (73.7 kg)  07/30/22 160 lb 6.4 oz (72.8 kg)    Physical Exam   Outpatient Encounter Medications as of 03/18/2023  Medication Sig   amLODipine (NORVASC) 10 MG tablet TAKE ONE TABLET BY MOUTH DAILY   azelastine (ASTELIN) 0.1 % nasal spray Place 1 spray into both nostrils 2 (two) times daily. Use in each nostril as directed   cetirizine (ALLERGY, CETIRIZINE,) 10 MG tablet Take 10 mg by mouth daily.   esomeprazole (NEXIUM) 40 MG capsule Take 20 mg by mouth daily.   lovastatin (MEVACOR) 40 MG tablet TAKE ONE TABLET  BY MOUTH AT BEDTIME   No facility-administered encounter medications on file as of 03/18/2023.     Lab Results  Component Value Date   WBC 5.8 08/06/2022   HGB 14.4 08/06/2022   HCT 43.6 08/06/2022   PLT 265.0 08/06/2022   GLUCOSE 102 (H) 11/12/2022   CHOL 199 11/12/2022   TRIG 73.0 11/12/2022   HDL 55.40 11/12/2022   LDLDIRECT 210.3 11/30/2013   LDLCALC 129 (H) 11/12/2022   ALT 29 11/12/2022   AST 19 11/12/2022   NA 138 11/12/2022   K 4.1 11/12/2022   CL 105 11/12/2022   CREATININE 0.89 11/12/2022   BUN 20 11/12/2022   CO2 26 11/12/2022   TSH 2.45 08/06/2022   PSA 1.89 08/06/2022   HGBA1C 6.2 08/06/2022    CT Angio Chest PE W and/or Wo Contrast  Result Date: 11/04/2018 CLINICAL DATA:  Chest pain EXAM: CT ANGIOGRAPHY CHEST WITH CONTRAST TECHNIQUE: Multidetector CT imaging of the chest was performed using the standard protocol during bolus administration of intravenous contrast. Multiplanar CT image reconstructions and MIPs were obtained to evaluate the vascular anatomy. CONTRAST:  9mL  OMNIPAQUE IOHEXOL 350 MG/ML SOLN COMPARISON:  None. FINDINGS: Cardiovascular: --Pulmonary arteries: Contrast injection is sufficient to demonstrate satisfactory opacification of the pulmonary arteries to the segmental level. There is no pulmonary embolus. The main pulmonary artery is within normal limits for size. --Aorta: Satisfactory opacification of the thoracic aorta. No aortic dissection or other acute aortic syndrome. Conventional 3 vessel aortic branching pattern. The aortic course and caliber are normal. There is no aortic atherosclerosis. --Heart: Normal size. No pericardial effusion. Mediastinum/Nodes: No mediastinal, hilar or axillary lymphadenopathy. The visualized thyroid and thoracic esophageal course are unremarkable. Lungs/Pleura: No pulmonary nodules or masses. No pleural effusion or pneumothorax. No focal airspace consolidation. No focal pleural abnormality. Upper Abdomen: Contrast bolus timing is not optimized for evaluation of the abdominal organs. Within this limitation, the visualized organs of the upper abdomen are normal. Musculoskeletal: No chest wall abnormality. No acute or significant osseous findings. Review of the MIP images confirms the above findings. IMPRESSION: No pulmonary embolus or other acute thoracic abnormality. Electronically Signed   By: Ulyses Jarred M.D.   On: 11/04/2018 18:32   DG Chest 2 View  Result Date: 11/04/2018 CLINICAL DATA:  Chest pain, chest pressure. EXAM: CHEST - 2 VIEW COMPARISON:  Chest x-ray dated 02/07/2017. FINDINGS: Heart size and mediastinal contours are within normal limits. Small platelike opacity within the LEFT perihilar lung, better appreciated on the lateral projection, atelectasis versus pneumonia. RIGHT lung is clear. No pleural effusion or pneumothorax bilaterally. Mild chronic appearing vertebral body wedging at multiple levels of the thoracic spine. No acute or suspicious osseous finding. IMPRESSION: Subtle platelike opacity within the  LEFT perihilar lung, atelectasis versus pneumonia, favor early developing pneumonia. Recommend follow-up chest x-ray to ensure resolution. Electronically Signed   By: Franki Cabot M.D.   On: 11/04/2018 16:15       Assessment & Plan:  Hyperglycemia  Primary hypertension  Pure hypercholesterolemia     Einar Pheasant, MD

## 2023-03-19 ENCOUNTER — Encounter: Payer: Self-pay | Admitting: Internal Medicine

## 2023-03-19 DIAGNOSIS — B079 Viral wart, unspecified: Secondary | ICD-10-CM | POA: Insufficient documentation

## 2023-03-19 NOTE — Assessment & Plan Note (Signed)
Persistent wart on finger.  Refer to dermatology.

## 2023-03-19 NOTE — Assessment & Plan Note (Signed)
On lovastatin.  Did not tolerate repatha. Low cholesterol diet and exercise.  Follow lipid panel and liver function tests.

## 2023-03-19 NOTE — Assessment & Plan Note (Signed)
Blood pressure as outlined.  On amlodipine. Have him spot check his pressure.  Follow pressures and metabolic panel.  Send in readings.

## 2023-03-19 NOTE — Assessment & Plan Note (Signed)
Low carb diet and exercise.  Follow met b and a1c.   

## 2023-03-19 NOTE — Assessment & Plan Note (Signed)
Has quit smoking.  Last 05/21/21.  Doing well.  Discussed screening CT. Wants to hold.  Follow.

## 2023-03-21 ENCOUNTER — Other Ambulatory Visit: Payer: Self-pay

## 2023-03-21 MED ORDER — EZETIMIBE 10 MG PO TABS: 10.0000 mg | ORAL_TABLET | Freq: Every day | ORAL | 0 refills | Status: DC

## 2023-04-06 ENCOUNTER — Telehealth: Payer: Self-pay | Admitting: Internal Medicine

## 2023-04-06 NOTE — Telephone Encounter (Signed)
Pt wife called stating the pt is having severe allergic reaction to the medication-ezetimibe. Pt wife would like to be called Diarrhea, stomach cramps and fatigue

## 2023-04-07 NOTE — Telephone Encounter (Signed)
FYI   Patient has stopped his zetia because it caused him to have severe diarrhea and stomach cramps. He has been off since Sunday and symptoms have completely resolved. Advised to remain off of the zetia and we can look into other treatment options at his next appt.

## 2023-05-02 ENCOUNTER — Encounter: Payer: Self-pay | Admitting: Emergency Medicine

## 2023-05-02 ENCOUNTER — Other Ambulatory Visit
Admission: RE | Admit: 2023-05-02 | Discharge: 2023-05-02 | Disposition: A | Discharge status: Home / Self Care | Payer: 59 | Source: Ambulatory Visit | Attending: *Deleted | Admitting: *Deleted

## 2023-05-02 ENCOUNTER — Ambulatory Visit
Admission: RE | Admit: 2023-05-02 | Discharge: 2023-05-02 | Disposition: A | Discharge status: Home / Self Care | Payer: 59 | Source: Ambulatory Visit | Attending: Family Medicine | Admitting: Family Medicine

## 2023-05-02 ENCOUNTER — Other Ambulatory Visit: Payer: Self-pay

## 2023-05-02 ENCOUNTER — Encounter: Payer: Self-pay | Admitting: Family Medicine

## 2023-05-02 ENCOUNTER — Ambulatory Visit: Payer: 59 | Admitting: Family Medicine

## 2023-05-02 ENCOUNTER — Emergency Department
Admission: EM | Admit: 2023-05-02 | Discharge: 2023-05-03 | Disposition: A | Discharge status: Home / Self Care | Payer: 59 | Attending: Emergency Medicine | Admitting: Emergency Medicine

## 2023-05-02 VITALS — BP 122/80 | HR 75 | Temp 97.7°F | Ht 66.0 in | Wt 164.8 lb

## 2023-05-02 DIAGNOSIS — I10 Essential (primary) hypertension: Secondary | ICD-10-CM | POA: Diagnosis not present

## 2023-05-02 DIAGNOSIS — Z87891 Personal history of nicotine dependence: Secondary | ICD-10-CM | POA: Insufficient documentation

## 2023-05-02 DIAGNOSIS — R079 Chest pain, unspecified: Secondary | ICD-10-CM | POA: Diagnosis present

## 2023-05-02 LAB — COMPREHENSIVE METABOLIC PANEL
ALT: 29 U/L (ref 0–44)
AST: 24 U/L (ref 15–41)
Albumin: 4.4 g/dL (ref 3.5–5.0)
Alkaline Phosphatase: 67 U/L (ref 38–126)
Anion gap: 8 (ref 5–15)
BUN: 16 mg/dL (ref 8–23)
CO2: 29 mmol/L (ref 22–32)
Calcium: 9.1 mg/dL (ref 8.9–10.3)
Chloride: 103 mmol/L (ref 98–111)
Creatinine, Ser: 0.84 mg/dL (ref 0.61–1.24)
GFR, Estimated: >60 mL/min (ref >60)
Glucose, Bld: 74 mg/dL (ref 70–99)
Potassium: 3.8 mmol/L (ref 3.5–5.1)
Sodium: 140 mmol/L (ref 135–145)
Total Bilirubin: 0.6 mg/dL (ref 0.3–1.2)
Total Protein: 7.8 g/dL (ref 6.5–8.1)

## 2023-05-02 LAB — TROPONIN I (HIGH SENSITIVITY)
Troponin I (High Sensitivity): 22 ng/L — ABNORMAL HIGH (ref <18)
Troponin I (High Sensitivity): 20 ng/L — ABNORMAL HIGH (ref <18)

## 2023-05-02 LAB — C-REACTIVE PROTEIN: CRP: 0.7 mg/dL (ref <1.0)

## 2023-05-02 LAB — SEDIMENTATION RATE: Sed Rate: 5 mm/hr (ref 0–20)

## 2023-05-02 NOTE — Progress Notes (Signed)
Marikay Alar, MD Phone: [phone]  Thomas Harper is a 62 y.o. male who presents today for same-day visit.  Chest pain: Patient presents with his wife who also provides some history.  He notes he woke up last night around 1030 with bad chest pain while he was laying down.  He notes he sat up and it stopped hurting.  He laid back down again and it started to hurt again so he finished the night sleeping sitting up.  He describes it as a crushing chest pressure.  He has had a little bit of trouble breathing for several weeks after stopping allergy medication.  He notes coughing intermittently that is been going on for a week or 2.  No wheezing.  He did some more physical activity over the last several days than he typically does.  They wonder if it could be a muscular issue or breathing issue.  He does not have any chest pain now and has not had any today.  Social History   Tobacco Use  Smoking Status Former   Packs/day: 1   Types: Cigarettes   Quit date: 05/21/2021   Years since quitting: 1.9  Smokeless Tobacco Former    Current Outpatient Medications on File Prior to Visit  Medication Sig Dispense Refill   amLODipine (NORVASC) 10 MG tablet Take 1 tablet (10 mg total) by mouth daily. 90 tablet 1   esomeprazole (NEXIUM) 40 MG capsule Take 20 mg by mouth daily.     ezetimibe (ZETIA) 10 MG tablet Take 1 tablet (10 mg total) by mouth daily. 90 tablet 0   lovastatin (MEVACOR) 40 MG tablet Take 1 tablet (40 mg total) by mouth at bedtime. 90 tablet 3   No current facility-administered medications on file prior to visit.     ROS see history of present illness  Objective  Physical Exam Vitals:   05/02/23 1610  BP: 122/80  Pulse: 75  Temp: 97.7 F (36.5 C)  SpO2: 95%    BP Readings from Last 3 Encounters:  05/02/23 122/80  03/18/23 138/78  11/12/22 134/80   Wt Readings from Last 3 Encounters:  05/02/23 164 lb 12.8 oz (74.8 kg)  03/18/23 166 lb 9.6 oz (75.6 kg)  11/12/22  162 lb 6.4 oz (73.7 kg)    Physical Exam Constitutional:      General: He is not in acute distress.    Appearance: He is not diaphoretic.  Cardiovascular:     Rate and Rhythm: Normal rate and regular rhythm.     Heart sounds: Normal heart sounds.  Pulmonary:     Effort: Pulmonary effort is normal.     Breath sounds: Normal breath sounds.  Skin:    General: Skin is warm and dry.  Neurological:     Mental Status: He is alert.    EKG: Sinus rhythm, rate 72, no ST or T wave changes  Assessment/Plan: Please see individual problem list.  Chest pain, unspecified type Assessment & Plan: New onset issue.  Discussed that this is an odd presentation that does not point 100% anything specific though discussed my concern for possible pericarditis and did discuss it could represent a musculoskeletal issue and less likely a pulmonary issue.  EKG completed today is reassuring.  Chest x-ray will be completed as well.  Lab work as outlined.  Patient will go to the medical mall at the hospital for the x-ray and lab work.  Advised to seek medical attention if he has recurrent chest pain or if  chest pain comes on and does not go away very quickly.  Orders: -     EKG 12-Lead -     CBC; Future -     Troponin I (High Sensitivity); Future -     Comprehensive metabolic panel; Future -     Sedimentation rate; Future -     C-reactive protein; Future -     DG Chest 2 View; Future     Return if symptoms worsen or fail to improve.   Marikay Alar, MD [facility]

## 2023-05-02 NOTE — ED Provider Notes (Addendum)
[facility] Provider Note    Event Date/Time   First MD Initiated Contact with Patient 05/02/23 2359     (approximate)   History   Abnormal Lab   HPI  Thomas Harper is a 62 y.o. male   Past medical history of hypertension, hyperlipidemia, chronic leg pain, former smoker presents Emergency Department with chest pain and told by primary doctor to come to the emergency department because outpatient labs showed elevated troponin.  Chest pain started last night around 1030 when laying down.  Worse with laying down got better with sitting up.  Not exertional.  No radiation.  Has had a mild cough.  No fevers.   No other acute medical complaints.  Feels well now.  External Medical Documents Reviewed: Family medicine note from yesterday 05/02/2023 corroborating above history.      Physical Exam   Triage Vital Signs: ED Triage Vitals  Enc Vitals Group     BP 05/02/23 2024 (!) 147/90     Pulse Rate 05/02/23 2024 81     Resp 05/02/23 2024 16     Temp 05/02/23 2024 98 F (36.7 C)     Temp Source 05/02/23 2024 Oral     SpO2 05/02/23 2024 96 %     Weight 05/02/23 2018 162 lb (73.5 kg)     Height 05/02/23 2018 5\' 6"  (1.676 m)     Head Circumference --      Peak Flow --      Pain Score 05/02/23 2018 0     Pain Loc --      Pain Edu? --      Excl. in GC? --     Most recent vital signs: Vitals:   05/02/23 2024  BP: (!) 147/90  Pulse: 81  Resp: 16  Temp: 98 F (36.7 C)  SpO2: 96%    General: Awake, no distress.  CV:  Good peripheral perfusion.  Resp:  Normal effort.  Abd:  No distention.  Other:  Lungs clear abdomen soft and nontender radial pulses intact bilaterally skin appears warm well-perfused comfortable appearing   ED Results / Procedures / Treatments   Labs (all labs ordered are listed, but only abnormal results are displayed) Labs Reviewed  TROPONIN I (HIGH SENSITIVITY) - Abnormal; Notable for the following components:      Result  Value   Troponin I (High Sensitivity) 20 (*)    All other components within normal limits  TROPONIN I (HIGH SENSITIVITY)     I ordered and reviewed the above labs they are notable for flat trops in 20 x2  EKG  ED ECG REPORT I, Pilar Jarvis, the attending physician, personally viewed and interpreted this ECG.   Date: 05/02/2023  EKG Time: 2024  Rate: 79  Rhythm: nsr  Axis: nl  Intervals:none  ST&T Change: no stemi    RADIOLOGY I independently reviewed and interpreted chest x-ray from yesterday and see no obvious focality or pneumothorax   PROCEDURES:  Critical Care performed: No  Procedures   MEDICATIONS ORDERED IN ED: Medications - No data to display  IMPRESSION / MDM / ASSESSMENT AND PLAN / ED COURSE  I reviewed the triage vital signs and the nursing notes.                                Patient's presentation is most consistent with acute presentation with potential threat to life or bodily  function.  Differential diagnosis includes, but is not limited to, ACS, PE, dissection, pericarditis, GERD or gastritis, musculoskeletal pain   The patient is on the cardiac monitor to evaluate for evidence of arrhythmia and/or significant heart rate changes.  MDM: This is a patient with multiple cardiac risk factors with very atypical chest pain for emergent pathologies like ACS, PE, dissection.  Serial troponins have been flat.  EKG is nonischemic and chest x-ray obtained yesterday looks normal.  He is comfortable now with normal hemodynamics.  No evidence of pericarditis on his EKG, and a bedside ultrasound showed no significant pericardial effusion and good squeeze.  I doubt emergent pathologies to explain his chest pain.  He will follow-up with PMD for further assessment and management as needed.  I will prescribe him a PPI given the symptoms may represent an element of gastritis/GERD.  States return to Emergency Department with any new or worsening symptoms.        FINAL CLINICAL IMPRESSION(S) / ED DIAGNOSES   Final diagnoses:  Nonspecific chest pain     Rx / DC Orders   ED Discharge Orders          Ordered    Ambulatory referral to Cardiology        05/03/23 0003             Note:  This document was prepared using Dragon voice recognition software and may include unintentional dictation errors.    Pilar Jarvis, MD 05/03/23 0981    Pilar Jarvis, MD 05/03/23 [id number]

## 2023-05-02 NOTE — ED Provider Notes (Incomplete)
[facility] Provider Note    Event Date/Time   First MD Initiated Contact with Patient 05/02/23 2359     (approximate)   History   Abnormal Lab   HPI  Thomas Harper is a 62 y.o. male   Past medical history of hypertension, hyperlipidemia, chronic leg pain, former smoker presents Emergency Department with chest pain and told by primary doctor to come to the emergency department because outpatient labs showed elevated troponin.  Chest pain started last night around 1030 when laying down.  Worse with laying down got better with sitting up.  Not exertional.  No radiation.  Has had a mild cough.  No fevers.   No other acute medical complaints.  Feels well now.  External Medical Documents Reviewed: Family medicine note from yesterday 05/02/2023 corroborating above history.      Physical Exam   Triage Vital Signs: ED Triage Vitals  Enc Vitals Group     BP 05/02/23 2024 (!) 147/90     Pulse Rate 05/02/23 2024 81     Resp 05/02/23 2024 16     Temp 05/02/23 2024 98 F (36.7 C)     Temp Source 05/02/23 2024 Oral     SpO2 05/02/23 2024 96 %     Weight 05/02/23 2018 162 lb (73.5 kg)     Height 05/02/23 2018 5\' 6"  (1.676 m)     Head Circumference --      Peak Flow --      Pain Score 05/02/23 2018 0     Pain Loc --      Pain Edu? --      Excl. in GC? --     Most recent vital signs: Vitals:   05/02/23 2024  BP: (!) 147/90  Pulse: 81  Resp: 16  Temp: 98 F (36.7 C)  SpO2: 96%    General: Awake, no distress.  CV:  Good peripheral perfusion.  Resp:  Normal effort.  Abd:  No distention.  Other:  Lungs clear abdomen soft and nontender radial pulses intact bilaterally skin appears warm well-perfused comfortable appearing   ED Results / Procedures / Treatments   Labs (all labs ordered are listed, but only abnormal results are displayed) Labs Reviewed  TROPONIN I (HIGH SENSITIVITY) - Abnormal; Notable for the following components:      Result  Value   Troponin I (High Sensitivity) 20 (*)    All other components within normal limits  TROPONIN I (HIGH SENSITIVITY)     I ordered and reviewed the above labs they are notable for flat trops in 20 x2  EKG  ED ECG REPORT I, Pilar Jarvis, the attending physician, personally viewed and interpreted this ECG.   Date: 05/02/2023  EKG Time: 2024  Rate: 79  Rhythm: nsr  Axis: nl  Intervals:none  ST&T Change: no stemi    RADIOLOGY I independently reviewed and interpreted chest x-ray from yesterday and see no obvious focality or pneumothorax   PROCEDURES:  Critical Care performed: No  Procedures   MEDICATIONS ORDERED IN ED: Medications - No data to display  IMPRESSION / MDM / ASSESSMENT AND PLAN / ED COURSE  I reviewed the triage vital signs and the nursing notes.                                Patient's presentation is most consistent with acute presentation with potential threat to life or bodily  function.  Differential diagnosis includes, but is not limited to, ACS, PE, dissection, GERD or gastritis, musculoskeletal pain   The patient is on the cardiac monitor to evaluate for evidence of arrhythmia and/or significant heart rate changes.  MDM: This is a patient with multiple cardiac risk factors with very atypical chest pain for emergent pathologies like ACS, PE, dissection.  Serial troponins have been flat.  EKG is nonischemic and chest x-ray obtained yesterday looks normal.  He is comfortable now with normal hemodynamics.  I doubt emergent pathologies to explain his chest pain.  He will follow-up with PMD for further assessment and management as needed.  I will prescribe him a PPI given the symptoms may represent an element of gastritis/GERD.  States return to Emergency Department with any new or worsening symptoms.       FINAL CLINICAL IMPRESSION(S) / ED DIAGNOSES   Final diagnoses:  None     Rx / DC Orders   ED Discharge Orders     None         Note:  This document was prepared using Dragon voice recognition software and may include unintentional dictation errors.

## 2023-05-02 NOTE — ED Triage Notes (Addendum)
Pt in via POV; reports waking up from a sleep multiple times last night w/ generalized chest pain, denies any accompanying symptoms.  Patient was seen at PCP office today, EKG, chest xray, lab work performed, received a call back that Troponin was elevated and needed to have a repeat Troponin.  Patient states chest pain has resolved, denies any other similar occurrences.  Ambulatory to triage, NAD noted at this time.

## 2023-05-02 NOTE — Assessment & Plan Note (Addendum)
New onset issue.  Discussed that this is an odd presentation that does not point 100% anything specific though discussed my concern for possible pericarditis and did discuss it could represent a musculoskeletal issue and less likely a pulmonary issue.  EKG completed today is reassuring.  Chest x-ray will be completed as well.  Lab work as outlined.  Patient will go to the medical mall at the hospital for the x-ray and lab work.  Advised to seek medical attention if he has recurrent chest pain or if chest pain comes on and does not go away very quickly.

## 2023-05-02 NOTE — Patient Instructions (Signed)
Nice to see you. Please go to the medical mall to hospital for your chest x-ray and your lab work. If you develop persistent or recurrent chest pain please go to the emergency room.

## 2023-05-03 NOTE — Discharge Instructions (Signed)
Fortunately your testing today did not show an emergency cause of your chest pain like a heart attack.  It is important that you follow-up with your primary doctor within this next week for reevaluation, I also referred you to a cardiologist who will call you for an appointment for further assessment of your heart health.  Take ibuprofen 600 mg every 8 hours with food for the next 7 days.    Continue to take your Nexium as prescribed as this may be an element of stomach acid related pain.  It is important that you keep an eye on your symptoms and return to the emergency department or call your doctor right away if you develop any new, worsening, unexpected symptoms.

## 2023-05-05 ENCOUNTER — Telehealth: Payer: Self-pay

## 2023-05-05 NOTE — Telephone Encounter (Signed)
Patient confirmed doing ok. Patient agreed to be re-evaluated if any acute symptoms prior to 5/17 appt.

## 2023-05-05 NOTE — Transitions of Care (Post Inpatient/ED Visit) (Signed)
I spoke with pt; pt said he is feeling a lot better since 05/03/23 ED visit. Pt is presently at work;  sending pt by warm transfer to [employer] to schedule appt. pt can only be seen on Fridays. Pt already has new pt appt scheduled with Dr Nanetta Batty cardiologist on 06/17/23. Sending note to Dr Dale [place].    05/05/2023  Name: Thomas Harper MRN: [id number] DOB: 1961-07-14  Today's TOC FU Call Status: Today's TOC FU Call Status:: Successful TOC FU Call Competed TOC FU Call Complete Date: 05/05/23  Transition Care Management Follow-up Telephone Call Date of Discharge: 05/03/23 Discharge Facility: [facility]) Type of Discharge: Emergency Department Reason for ED Visit: Cardiac Conditions Cardiac Conditions Diagnosis: Chest Pain Persisting How have you been since you were released from the hospital?: Better Any questions or concerns?: No  Items Reviewed: Did you receive and understand the discharge instructions provided?: Yes Medications obtained,verified, and reconciled?: Yes (Medications Reviewed) (pt is familiar with taking meds; no concerns.) Any new allergies since your discharge?: No Dietary orders reviewed?: NA Do you have support at home?: Yes People in Home: spouse Name of Support/Comfort Primary Source: Cathy  Medications Reviewed Today: Medications Reviewed Today     Reviewed by Francene Boyers, CMA (Certified Medical Assistant) on 05/02/23 at 1622  Med List Status: <None>   Medication Order Taking? Sig Documenting Provider Last Dose Status Informant  amLODipine (NORVASC) 10 MG tablet [id number] Yes Take 1 tablet (10 mg total) by mouth daily. Dale [place], MD Taking Active   esomeprazole (NEXIUM) 40 MG capsule [id number] Yes Take 20 mg by mouth daily. Dale Fouke, MD Taking Active   ezetimibe (ZETIA) 10 MG tablet [id number] Yes Take 1 tablet (10 mg total) by mouth daily. Dale Sutherland, MD Taking Active   lovastatin  (MEVACOR) 40 MG tablet [id number] Yes Take 1 tablet (40 mg total) by mouth at bedtime. Dale [place], MD Taking Active             Home Care and Equipment/Supplies: Were Home Health Services Ordered?: NA Any new equipment or medical supplies ordered?: NA  Functional Questionnaire: Do you need assistance with bathing/showering or dressing?: No Do you need assistance with meal preparation?: No Do you need assistance with eating?: No Do you have difficulty maintaining continence: No Do you need assistance with getting out of bed/getting out of a chair/moving?: No Do you have difficulty managing or taking your medications?: No  Follow up appointments reviewed: PCP Follow-up appointment confirmed?: No (sending pt by warm transfer to [facility] to schedule appt. pt can only be seen on Fridays.) MD Provider Line Number:[phone] Given: Yes Specialist Hospital Follow-up appointment confirmed?: Yes Date of Specialist follow-up appointment?: 06/17/23 Follow-Up Specialty Provider:: Dr Nanetta Batty Do you need transportation to your follow-up appointment?: No Do you understand care options if your condition(s) worsen?: Yes-patient verbalized understanding    SIGNATURE Lewanda Rife, LPN

## 2023-05-05 NOTE — Telephone Encounter (Signed)
Noted. Please confirm doing ok and ok to wait until appt scheduled.  Was seen for chest pain.

## 2023-05-05 NOTE — Telephone Encounter (Signed)
Nurse called in looking for an appt for pt after ED. I booked him for 5/17 @ 11am.

## 2023-05-13 ENCOUNTER — Ambulatory Visit: Payer: 59 | Admitting: Internal Medicine

## 2023-05-13 ENCOUNTER — Encounter: Payer: Self-pay | Admitting: Internal Medicine

## 2023-05-13 VITALS — BP 120/80 | HR 71 | Temp 97.7°F | Ht 66.0 in | Wt 164.4 lb

## 2023-05-13 DIAGNOSIS — K219 Gastro-esophageal reflux disease without esophagitis: Secondary | ICD-10-CM

## 2023-05-13 DIAGNOSIS — E7800 Pure hypercholesterolemia, unspecified: Secondary | ICD-10-CM | POA: Diagnosis not present

## 2023-05-13 DIAGNOSIS — R0602 Shortness of breath: Secondary | ICD-10-CM | POA: Diagnosis not present

## 2023-05-13 DIAGNOSIS — R079 Chest pain, unspecified: Secondary | ICD-10-CM

## 2023-05-13 DIAGNOSIS — R739 Hyperglycemia, unspecified: Secondary | ICD-10-CM

## 2023-05-13 DIAGNOSIS — F17200 Nicotine dependence, unspecified, uncomplicated: Secondary | ICD-10-CM

## 2023-05-13 DIAGNOSIS — I10 Essential (primary) hypertension: Secondary | ICD-10-CM

## 2023-05-13 LAB — CBC WITH DIFFERENTIAL/PLATELET
Basophils Absolute: 0.1 10*3/uL (ref 0.0–0.1)
Basophils Relative: 0.8 % (ref 0.0–3.0)
Eosinophils Absolute: 0.2 10*3/uL (ref 0.0–0.7)
Eosinophils Relative: 2.8 % (ref 0.0–5.0)
HCT: 42.4 % (ref 39.0–52.0)
Hemoglobin: 14.4 g/dL (ref 13.0–17.0)
Lymphocytes Relative: 18.8 % (ref 12.0–46.0)
Lymphs Abs: 1.4 10*3/uL (ref 0.7–4.0)
MCHC: 33.8 g/dL (ref 30.0–36.0)
MCV: 88.4 fl (ref 78.0–100.0)
Monocytes Absolute: 0.6 10*3/uL (ref 0.1–1.0)
Monocytes Relative: 8.2 % (ref 3.0–12.0)
Neutro Abs: 5 10*3/uL (ref 1.4–7.7)
Neutrophils Relative %: 69.4 % (ref 43.0–77.0)
Platelets: 310 10*3/uL (ref 150.0–400.0)
RBC: 4.8 Mil/uL (ref 4.22–5.81)
RDW: 13.5 % (ref 11.5–15.5)
WBC: 7.2 10*3/uL (ref 4.0–10.5)

## 2023-05-13 LAB — HEPATIC FUNCTION PANEL
ALT: 24 U/L (ref 0–53)
AST: 22 U/L (ref 0–37)
Albumin: 4.3 g/dL (ref 3.5–5.2)
Alkaline Phosphatase: 71 U/L (ref 39–117)
Bilirubin, Direct: 0.1 mg/dL (ref 0.0–0.3)
Total Bilirubin: 0.4 mg/dL (ref 0.2–1.2)
Total Protein: 6.8 g/dL (ref 6.0–8.3)

## 2023-05-13 LAB — BASIC METABOLIC PANEL
BUN: 18 mg/dL (ref 6–23)
CO2: 27 mEq/L (ref 19–32)
Calcium: 9.2 mg/dL (ref 8.4–10.5)
Chloride: 102 mEq/L (ref 96–112)
Creatinine, Ser: 0.94 mg/dL (ref 0.40–1.50)
GFR: 87.21 mL/min (ref >60.00)
Glucose, Bld: 85 mg/dL (ref 70–99)
Potassium: 4.4 mEq/L (ref 3.5–5.1)
Sodium: 137 mEq/L (ref 135–145)

## 2023-05-13 LAB — TSH: TSH: 1.57 u[IU]/mL (ref 0.35–5.50)

## 2023-05-13 NOTE — Progress Notes (Unsigned)
Subjective:    [name], male    DOB: [birth year], 62 y.o.   MRN: [id number]  Patient here for  Chief Complaint  Patient presents with   Hospitalization Follow-up    ED follow up     HPI Here for hospital follow up. Was evaluated 05/02/23 - chest pain.  Was seen initially as outpatient.  Troponin elevated.  Advised to go to ER.  W/up in ER - troponins flat.  EKG and cxr - unrevealing.    Past Medical History:  Diagnosis Date   Chronic leg pain    s/p injury right leg (age 32)   GERD (gastroesophageal reflux disease)    History of ankle fracture    persistent pain   Hypercholesterolemia    Hypertension    Nephrolithiasis    followed by Assunta Gambles   Vitamin D deficiency    Past Surgical History:  Procedure Laterality Date   ABDOMINAL SURGERY     after accident.  repaired spleen   LEG SURGERY     Family History  Problem Relation Age of Onset   Colon cancer Father    Heart disease Father        has a pacemaker and defibrillator   Hypertension Father    Diabetes Father    Social History   Socioeconomic History   Marital status: Married    Spouse name: Not on file   Number of children: Not on file   Years of education: Not on file   Highest education level: Not on file  Occupational History   Not on file  Tobacco Use   Smoking status: Former    Packs/day: 1    Types: Cigarettes    Quit date: 05/21/2021    Years since quitting: 1.9   Smokeless tobacco: Former  Building services engineer Use: Never used  Substance and Sexual Activity   Alcohol use: No    Alcohol/week: 0.0 standard drinks of alcohol   Drug use: Yes    Types: Marijuana   Sexual activity: Not on file  Other Topics Concern   Not on file  Social History Narrative   Not on file   Social Determinants of Health   Financial Resource Strain: Not on file  Food Insecurity: Not on file  Transportation Needs: Not on file  Physical Activity: Not on file  Stress: Not on file  Social  Connections: Not on file     Review of Systems     Objective:     BP 120/80   Pulse 71   Temp 97.7 F (36.5 C) (Oral)   Ht 5\' 6"  (1.676 m)   Wt 164 lb 6.4 oz (74.6 kg)   SpO2 95%   BMI 26.53 kg/m  Wt Readings from Last 3 Encounters:  05/13/23 164 lb 6.4 oz (74.6 kg)  05/02/23 162 lb (73.5 kg)  05/02/23 164 lb 12.8 oz (74.8 kg)    Physical Exam   Outpatient Encounter Medications as of 05/13/2023  Medication Sig   amLODipine (NORVASC) 10 MG tablet Take 1 tablet (10 mg total) by mouth daily.   b complex vitamins capsule Take 1 capsule by mouth daily.   esomeprazole (NEXIUM) 40 MG capsule Take 20 mg by mouth daily.   ibuprofen (ADVIL) 200 MG tablet Take 200 mg by mouth every 6 (six) hours as needed.   loratadine (CLARITIN) 10 MG tablet Take 10 mg by mouth daily.   lovastatin (MEVACOR) 40 MG tablet Take 1  tablet (40 mg total) by mouth at bedtime.   [DISCONTINUED] ezetimibe (ZETIA) 10 MG tablet Take 1 tablet (10 mg total) by mouth daily. (Patient not taking: Reported on 05/13/2023)   No facility-administered encounter medications on file as of 05/13/2023.     Lab Results  Component Value Date   WBC 5.8 08/06/2022   HGB 14.4 08/06/2022   HCT 43.6 08/06/2022   PLT 265.0 08/06/2022   GLUCOSE 74 05/02/2023   CHOL 213 (H) 03/18/2023   TRIG 115.0 03/18/2023   HDL 53.80 03/18/2023   LDLDIRECT 210.3 11/30/2013   LDLCALC 137 (H) 03/18/2023   ALT 29 05/02/2023   AST 24 05/02/2023   NA 140 05/02/2023   K 3.8 05/02/2023   CL 103 05/02/2023   CREATININE 0.84 05/02/2023   BUN 16 05/02/2023   CO2 29 05/02/2023   TSH 2.45 08/06/2022   PSA 1.89 08/06/2022   HGBA1C 6.2 03/18/2023    DG Chest 2 View  Result Date: 05/06/2023 CLINICAL DATA:  Cough.  Chest pain last night. EXAM: CHEST - 2 VIEW COMPARISON:  11/12/2022. FINDINGS: Normal heart, mediastinum and hila. Clear lungs.  No pleural effusion or pneumothorax. Skeletal structures are unremarkable. IMPRESSION: No active  cardiopulmonary disease. Electronically Signed   By: Amie Portland M.D.   On: 05/06/2023 15:21       Assessment & Plan:  Primary hypertension -     Basic metabolic panel -     Hepatic function panel  Pure hypercholesterolemia -     Basic metabolic panel -     Hepatic function panel -     CBC with Differential/Platelet -     TSH     Dale Mellette, MD

## 2023-05-15 ENCOUNTER — Encounter: Payer: Self-pay | Admitting: Internal Medicine

## 2023-05-15 NOTE — Assessment & Plan Note (Signed)
Breathing as outlined.  Agree with further cardiac w/up as outlined.  Also, discussed further pulmonary evaluation.  History of smoking.  Possible asbestos exposure (per pt).  Discussed possible sleep apnea - snores, fatigue, etc.  Will refer to pulmonary for further testing and evaluation.

## 2023-05-15 NOTE — Assessment & Plan Note (Signed)
On lovastatin.  Did not tolerate repatha. Low cholesterol diet and exercise.  Follow lipid panel and liver function tests.   

## 2023-05-15 NOTE — Assessment & Plan Note (Signed)
Blood pressure as outlined.  On amlodipine. Have him spot check his pressure.  Follow pressures and metabolic panel.  Send in readings.   

## 2023-05-15 NOTE — Assessment & Plan Note (Signed)
Had the chest pain as outlined.  ER evaluation as outlined.  Troponin flat.  Had been doing a lot of physical activity prior to flare of symptoms.  No pain since.  Given risk factors, agree with further cardiac evaluation.  Has appt scheduled in June.  Will see if can arrange an earlier appt.  Aware if reoccurring pain or problems, he is to be reevaluated.

## 2023-05-15 NOTE — Assessment & Plan Note (Signed)
Low carb diet and exercise.  Follow met b and a1c.   

## 2023-05-15 NOTE — Assessment & Plan Note (Signed)
If takes nexium regularly - symptoms controlled.

## 2023-05-15 NOTE — Assessment & Plan Note (Signed)
Has quit smoking.  Last 05/21/21.  Doing well.  Discussed screening CT.  Has wanted to hold.  Did agree to pulmonary referral today.

## 2023-05-19 ENCOUNTER — Ambulatory Visit: Payer: 59 | Attending: Cardiovascular Disease | Admitting: Cardiology

## 2023-05-19 ENCOUNTER — Encounter: Payer: Self-pay | Admitting: Cardiology

## 2023-05-19 VITALS — BP 132/86 | HR 84 | Ht 65.0 in | Wt 165.2 lb

## 2023-05-19 DIAGNOSIS — R079 Chest pain, unspecified: Secondary | ICD-10-CM

## 2023-05-19 DIAGNOSIS — E785 Hyperlipidemia, unspecified: Secondary | ICD-10-CM

## 2023-05-19 DIAGNOSIS — I10 Essential (primary) hypertension: Secondary | ICD-10-CM

## 2023-05-19 MED ORDER — METOPROLOL TARTRATE 100 MG PO TABS: ORAL_TABLET | ORAL | 0 refills | Status: DC

## 2023-05-19 NOTE — Patient Instructions (Signed)
Medication Instructions:  Your physician recommends that you continue on your current medications as directed. Please refer to the Current Medication list given to you today.  *If you need a refill on your cardiac medications before your next appointment, please call your pharmacy*  TAKE METOPROLOL 2 HOURS BEFORE CT  Testing/Procedures: Coronary CTA- See instructions below  Your physician has requested that you have an echocardiogram. Echocardiography is a painless test that uses sound waves to create images of your heart. It provides your doctor with information about the size and shape of your heart and how well your heart's chambers and valves are working. This procedure takes approximately one hour. There are no restrictions for this procedure. Please do NOT wear cologne, perfume, aftershave, or lotions (deodorant is allowed). Please arrive 15 minutes prior to your appointment time.    Follow-Up: At [facility], you and your health needs are our priority.  As part of our continuing mission to provide you with exceptional heart care, we have created designated Provider Care Teams.  These Care Teams include your primary Cardiologist (physician) and Advanced Practice Providers (APPs -  Physician Assistants and Nurse Practitioners) who all work together to provide you with the care you need, when you need it.  We recommend signing up for the patient portal called "MyChart".  Sign up information is provided on this After Visit Summary.  MyChart is used to connect with patients for Virtual Visits (Telemedicine).  Patients are able to view lab/test results, encounter notes, upcoming appointments, etc.  Non-urgent messages can be sent to your provider as well.   To learn more about what you can do with MyChart, go to [email or web address].    Your next appointment:   3 month(s)  Provider:       Other Instructions   Your cardiac CT will be scheduled at one of the below locations:    [facility] [address] [place], [place] [address] [phone]   If scheduled at [facility], please arrive at the [facility] and Children's Entrance (Entrance C2) of [facility] 30 minutes prior to test start time. You can use the FREE valet parking offered at entrance C (encouraged to control the heart rate for the test)  Proceed to the [facility] Radiology Department (first floor) to check-in and test prep.  All radiology patients and guests should use entrance C2 at [facility], accessed from [facility], even though the hospital's physical address listed is [address].    If scheduled at [facility] or [facility], please arrive 15 mins early for check-in and test prep.   Please follow these instructions carefully (unless otherwise directed):  Hold all erectile dysfunction medications at least 3 days (72 hrs) prior to test. (Ie viagra, cialis, sildenafil, tadalafil, etc) We will administer nitroglycerin during this exam.   On the Night Before the Test: Be sure to Drink plenty of water. Do not consume any caffeinated/decaffeinated beverages or chocolate 12 hours prior to your test. Do not take any antihistamines 12 hours prior to your test.  On the Day of the Test: Drink plenty of water until 1 hour prior to the test. Do not eat any food 1 hour prior to test. You may take your regular medications prior to the test.  Take metoprolol (Lopressor) two hours prior to test. If you take Furosemide/Hydrochlorothiazide/Spironolactone, please HOLD on the morning of the test.  After the Test: Drink plenty of water. After receiving IV contrast, you may experience a mild flushed feeling. This is normal. On occasion, you may experience a mild rash up to 24 hours after the test. This is not dangerous. If this occurs, you can take Benadryl 25 mg and increase your fluid  intake. If you experience trouble breathing, this can be serious. If it is severe call 911 IMMEDIATELY. If it is mild, please call our office. If you take any of these medications: Glipizide/Metformin, Avandament, Glucavance, please do not take 48 hours after completing test unless otherwise instructed.  We will call to schedule your test 2-4 weeks out understanding that some insurance companies will need an authorization prior to the service being performed.   For non-scheduling related questions, please contact the cardiac imaging nurse navigator should you have any questions/concerns: Rockwell Alexandria, Cardiac Imaging Nurse Navigator Larey Brick, Cardiac Imaging Nurse Navigator  Heart and Vascular Services Direct Office Dial: [phone]   For scheduling needs, including cancellations and rescheduling, please call [place], [phone].

## 2023-05-19 NOTE — Progress Notes (Signed)
Cardiology Office Note:    Date:  05/19/2023   ID:  Thomas Harper, DOB Jul 08, 1961, MRN [id number]  PCP:  Dale Lander, MD  Cardiologist:  None  Electrophysiologist:  None   Referring MD: Pilar Jarvis, MD   Chief Complaint  Patient presents with   Chest Pain    History of Present Illness:    Thomas Harper is a 62 y.o. male with a hx of hypertension, hyperlipidemia, former tobacco use who presents as an ED follow-up for chest pain.  He was seen in the ED on 05/02/2023 with chest pain.  Troponin 22 > 20.  Negative ESR/CRP.  He reports he was doing heavy yard work on 5/3.  Did not note any chest pain while doing the yard work.  However on 5/5 he woke up during the night with chest pain.  States that it went across his whole chest and was squeezing pain.  He took 2 ibuprofen and went back to sleep.  However woke up at 2 AM on 5/6 with significant chest pain.  Improved with sitting up.  Also reports feeling short of breath.  Went to ED on 5/6.  Reports since that time he has been having intermittent pain, does appear worse with exertion.  Typically lasts for few minutes.  He denies any lightheadedness, syncope, lower extremity edema, or palpitations.  He smoked 1 to 1.5 packs/day x 40 years, quit age 85.  Family history includes father had MI in 29s.   Past Medical History:  Diagnosis Date   Chronic leg pain    s/p injury right leg (age 89)   GERD (gastroesophageal reflux disease)    History of ankle fracture    persistent pain   Hypercholesterolemia    Hypertension    Nephrolithiasis    followed by Assunta Gambles   Vitamin D deficiency     Past Surgical History:  Procedure Laterality Date   ABDOMINAL SURGERY     after accident.  repaired spleen   LEG SURGERY      Current Medications: Current Meds  Medication Sig   amLODipine (NORVASC) 10 MG tablet Take 1 tablet (10 mg total) by mouth daily.   b complex vitamins capsule Take 1 capsule by mouth daily.   esomeprazole (NEXIUM)  40 MG capsule Take 20 mg by mouth daily.   ibuprofen (ADVIL) 200 MG tablet Take 200 mg by mouth every 6 (six) hours as needed.   loratadine (CLARITIN) 10 MG tablet Take 10 mg by mouth daily.   lovastatin (MEVACOR) 40 MG tablet Take 1 tablet (40 mg total) by mouth at bedtime.     Allergies:   Fish oil, Lisinopril, and Simvastatin   Social History   Socioeconomic History   Marital status: Married    Spouse name: Not on file   Number of children: Not on file   Years of education: Not on file   Highest education level: Not on file  Occupational History   Not on file  Tobacco Use   Smoking status: Former    Packs/day: 1    Types: Cigarettes    Quit date: 05/21/2021    Years since quitting: 1.9   Smokeless tobacco: Former  Building services engineer Use: Never used  Substance and Sexual Activity   Alcohol use: No    Alcohol/week: 0.0 standard drinks of alcohol   Drug use: Yes    Types: Marijuana   Sexual activity: Not on file  Other Topics Concern   Not  on file  Social History Narrative   Not on file   Social Determinants of Health   Financial Resource Strain: Not on file  Food Insecurity: Not on file  Transportation Needs: Not on file  Physical Activity: Not on file  Stress: Not on file  Social Connections: Not on file     Family History: The patient's family history includes Colon cancer in his father; Diabetes in his father; Heart disease in his father; Hypertension in his father.  ROS:   Please see the history of present illness.     All other systems reviewed and are negative.  EKGs/Labs/Other Studies Reviewed:    The following studies were reviewed today:   EKG:   05/02/2023: Normal sinus rhythm, rate 79, no no ST abnormality  Recent Labs: 05/13/2023: ALT 24; BUN 18; Creatinine, Ser 0.94; Hemoglobin 14.4; Platelets 310.0; Potassium 4.4; Sodium 137; TSH 1.57  Recent Lipid Panel    Component Value Date/Time   CHOL 213 (H) 03/18/2023 0821   TRIG 115.0 03/18/2023  0821   HDL 53.80 03/18/2023 0821   CHOLHDL 4 03/18/2023 0821   VLDL 23.0 03/18/2023 0821   LDLCALC 137 (H) 03/18/2023 0821   LDLDIRECT 210.3 11/30/2013 1023    Physical Exam:    VS:  BP 132/86   Pulse 84   Ht 5\' 5"  (1.651 m)   Wt 165 lb 3.2 oz (74.9 kg)   SpO2 97%   BMI 27.49 kg/m     Wt Readings from Last 3 Encounters:  05/19/23 165 lb 3.2 oz (74.9 kg)  05/13/23 164 lb 6.4 oz (74.6 kg)  05/02/23 162 lb (73.5 kg)     GEN:  Well nourished, well developed in no acute distress HEENT: Normal NECK: No JVD; No carotid bruits LYMPHATICS: No lymphadenopathy CARDIAC: RRR, no murmurs, rubs, gallops RESPIRATORY:  Clear to auscultation without rales, wheezing or rhonchi  ABDOMEN: Soft, non-tender, non-distended MUSCULOSKELETAL:  No edema; No deformity  SKIN: Warm and dry NEUROLOGIC:  Alert and oriented x 3 PSYCHIATRIC:  Normal affect   ASSESSMENT:    1. Chest pain of uncertain etiology   2. Essential hypertension   3. Hyperlipidemia, unspecified hyperlipidemia type    PLAN:    Chest pain: Atypical in description but does report can be worse with exertion, suggesting possible angina.  Does have significant CAD risk factors (age, hypertension, hyperlipidemia, prior tobacco use). -Recommend coronary CTA to evaluate for obstructive CAD.  Will give Lopressor 100 mg prior to study -Echocardiogram to evaluate for structural heart disease  Hypertension: Continue amlodipine 10 mg daily  Hyperlipidemia: On lovastatin 40 mg daily.  LDL 137 on 03/18/2023.  Will follow-up results of coronary CTA to guide aggressive to be in lowering cholesterol  RTC in 3 months   Medication Adjustments/Labs and Tests Ordered: Current medicines are reviewed at length with the patient today.  Concerns regarding medicines are outlined above.  No orders of the defined types were placed in this encounter.  No orders of the defined types were placed in this encounter.   Patient Instructions  Medication  Instructions:  Your physician recommends that you continue on your current medications as directed. Please refer to the Current Medication list given to you today.  *If you need a refill on your cardiac medications before your next appointment, please call your pharmacy*  TAKE METOPROLOL 2 HOURS BEFORE CT  Testing/Procedures: Coronary CTA- See instructions below  Your physician has requested that you have an echocardiogram. Echocardiography is a painless test  that uses sound waves to create images of your heart. It provides your doctor with information about the size and shape of your heart and how well your heart's chambers and valves are working. This procedure takes approximately one hour. There are no restrictions for this procedure. Please do NOT wear cologne, perfume, aftershave, or lotions (deodorant is allowed). Please arrive 15 minutes prior to your appointment time.    Follow-Up: At [facility], you and your health needs are our priority.  As part of our continuing mission to provide you with exceptional heart care, we have created designated Provider Care Teams.  These Care Teams include your primary Cardiologist (physician) and Advanced Practice Providers (APPs -  Physician Assistants and Nurse Practitioners) who all work together to provide you with the care you need, when you need it.  We recommend signing up for the patient portal called "MyChart".  Sign up information is provided on this After Visit Summary.  MyChart is used to connect with patients for Virtual Visits (Telemedicine).  Patients are able to view lab/test results, encounter notes, upcoming appointments, etc.  Non-urgent messages can be sent to your provider as well.   To learn more about what you can do with MyChart, go to [email or web address].    Your next appointment:   3 month(s)  Provider:       Other Instructions   Your cardiac CT will be scheduled at one of the below locations:   [facility] [address] [place], [place] [address] [phone]   If scheduled at [facility], please arrive at the [facility] and Children's Entrance (Entrance C2) of [facility] 30 minutes prior to test start time. You can use the FREE valet parking offered at entrance C (encouraged to control the heart rate for the test)  Proceed to the [facility] Radiology Department (first floor) to check-in and test prep.  All radiology patients and guests should use entrance C2 at [facility], accessed from [facility], even though the hospital's physical address listed is [address]    If scheduled at [facility] or [facility], please arrive 15 mins early for check-in and test prep.   Please follow these instructions carefully (unless otherwise directed):  Hold all erectile dysfunction medications at least 3 days (72 hrs) prior to test. (Ie viagra, cialis, sildenafil, tadalafil, etc) We will administer nitroglycerin during this exam.   On the Night Before the Test: Be sure to Drink plenty of water. Do not consume any caffeinated/decaffeinated beverages or chocolate 12 hours prior to your test. Do not take any antihistamines 12 hours prior to your test.  On the Day of the Test: Drink plenty of water until 1 hour prior to the test. Do not eat any food 1 hour prior to test. You may take your regular medications prior to the test.  Take metoprolol (Lopressor) two hours prior to test. If you take Furosemide/Hydrochlorothiazide/Spironolactone, please HOLD on the morning of the test.       After the Test: Drink plenty of water. After receiving IV contrast, you may experience a mild flushed feeling. This is normal. On occasion, you may experience a mild rash up to 24 hours after the test. This is not dangerous. If this occurs, you can take Benadryl 25 mg and increase your fluid intake. If you  experience trouble breathing, this can be serious. If it is severe call 911 IMMEDIATELY. If it  is mild, please call our office. If you take any of these medications: Glipizide/Metformin, Avandament, Glucavance, please do not take 48 hours after completing test unless otherwise instructed.  We will call to schedule your test 2-4 weeks out understanding that some insurance companies will need an authorization prior to the service being performed.   For non-scheduling related questions, please contact the cardiac imaging nurse navigator should you have any questions/concerns: Rockwell Alexandria, Cardiac Imaging Nurse Navigator Larey Brick, Cardiac Imaging Nurse Navigator [place] [facility] Direct Office Dial: [phone]   For scheduling needs, including cancellations and rescheduling, please call [place], [phone].     Signed, Little Ishikawa, MD  05/19/2023 2:20 PM    [facility]

## 2023-06-02 ENCOUNTER — Telehealth (HOSPITAL_COMMUNITY): Payer: Self-pay | Admitting: *Deleted

## 2023-06-02 NOTE — Telephone Encounter (Signed)
Reaching out to patient to offer assistance regarding upcoming cardiac imaging study; pt verbalizes understanding of appt date/time, parking situation and where to check in, pre-test NPO status and medications ordered, and verified current allergies; name and call back number provided for further questions should they arise  Leamon Palau RN Navigator Cardiac Imaging [place] [facility] [phone] office [phone] cell  Patient to take 100mg metoprolol tartrate two hours prior to his cardiac CT scan. He is aware to arrive at 3:30pm. 

## 2023-06-03 ENCOUNTER — Telehealth: Payer: Self-pay | Admitting: Internal Medicine

## 2023-06-03 ENCOUNTER — Ambulatory Visit (HOSPITAL_COMMUNITY)
Admission: RE | Admit: 2023-06-03 | Discharge: 2023-06-03 | Disposition: A | Discharge status: Home / Self Care | Payer: 59 | Source: Ambulatory Visit | Attending: Internal Medicine | Admitting: Internal Medicine

## 2023-06-03 ENCOUNTER — Ambulatory Visit (HOSPITAL_COMMUNITY)
Admission: RE | Admit: 2023-06-03 | Discharge: 2023-06-03 | Disposition: A | Discharge status: Home / Self Care | Payer: 59 | Source: Ambulatory Visit | Attending: Cardiology | Admitting: Cardiology

## 2023-06-03 DIAGNOSIS — R079 Chest pain, unspecified: Secondary | ICD-10-CM | POA: Insufficient documentation

## 2023-06-03 DIAGNOSIS — I2510 Atherosclerotic heart disease of native coronary artery without angina pectoris: Secondary | ICD-10-CM

## 2023-06-03 DIAGNOSIS — R931 Abnormal findings on diagnostic imaging of heart and coronary circulation: Secondary | ICD-10-CM

## 2023-06-03 MED ORDER — IOHEXOL 350 MG/ML SOLN
95.0000 mL | Freq: Once | INTRAVENOUS | Status: AC | PRN
  Administered 2023-06-03: 95 mL via INTRAVENOUS

## 2023-06-03 MED ORDER — NITROGLYCERIN 0.4 MG SL SUBL
SUBLINGUAL_TABLET | SUBLINGUAL | Status: AC
  Filled 2023-06-03: qty 2

## 2023-06-03 MED ORDER — NITROGLYCERIN 0.4 MG SL SUBL
0.8000 mg | SUBLINGUAL_TABLET | Freq: Once | SUBLINGUAL | Status: AC
  Administered 2023-06-03: 0.8 mg via SUBLINGUAL

## 2023-06-03 NOTE — Progress Notes (Signed)
Pre-procedure vitals: HR 54 BP 122/82  IV started 1 attempt 20g Left AC Patient tolerated well  Post-procedure vitals: HR 60 BP 130/85  IV removed Catheter intact Bleeding controlled  Pt reports feeling well.  Denies headache, dizziness, lightheadedness.  Pt arrived to appt via private vehicle with significant other.  Pt transported to waiting room.  Able to ambulate without assist.  Pt denies having any questions, ready for discharge.

## 2023-06-03 NOTE — Telephone Encounter (Signed)
Called Patient Family In regard to CT findings. Called patients cell phone X2- no answer VM is full. Called wife X2- left message that we are trying to get back him about findings. Called mother- left message that we are trying to get back to him about findings.  Gave hand off to inpatient team; they will try to reach him as well.  Riley Lam, MD Cardiologist [facility]  [address] [place], [address] [place], [place] [address] [phone]  6:18 PM

## 2023-06-05 NOTE — Telephone Encounter (Signed)
Spoke with patient and his wife.  Discussed that coronary CTA suggests severe multivessel CAD as well as left atrial mass.  Discussed that need cardiac catheterization and echocardiogram ASAP.  We did discuss going to the ED for admission but he declines.  States that he has not had recent chest pain.  Recommended going to the ED if any chest pain, and he is agreeable to this.  Elnita Maxwell, I am not in the clinic this week but can we schedule him with APP as soon as possible to arrange cath?  He is also scheduled for echo later this month, but can we see if it can be moved up to as soon as possible?  He will need cath and echo prior to cardiac surgery evaluation

## 2023-06-06 ENCOUNTER — Other Ambulatory Visit: Payer: Self-pay

## 2023-06-06 ENCOUNTER — Encounter: Payer: Self-pay | Admitting: Internal Medicine

## 2023-06-06 ENCOUNTER — Ambulatory Visit: Payer: 59 | Attending: Internal Medicine | Admitting: Internal Medicine

## 2023-06-06 VITALS — BP 140/82 | HR 82 | Ht 66.0 in | Wt 166.2 lb

## 2023-06-06 DIAGNOSIS — I10 Essential (primary) hypertension: Secondary | ICD-10-CM

## 2023-06-06 DIAGNOSIS — I25118 Atherosclerotic heart disease of native coronary artery with other forms of angina pectoris: Secondary | ICD-10-CM

## 2023-06-06 DIAGNOSIS — I2089 Other forms of angina pectoris: Secondary | ICD-10-CM

## 2023-06-06 DIAGNOSIS — E785 Hyperlipidemia, unspecified: Secondary | ICD-10-CM

## 2023-06-06 DIAGNOSIS — R0602 Shortness of breath: Secondary | ICD-10-CM

## 2023-06-06 DIAGNOSIS — D151 Benign neoplasm of heart: Secondary | ICD-10-CM | POA: Insufficient documentation

## 2023-06-06 MED ORDER — ASPIRIN 81 MG PO TBEC
81.0000 mg | DELAYED_RELEASE_TABLET | Freq: Every day | ORAL | 3 refills | Status: AC
Start: 2023-06-06 — End: ?

## 2023-06-06 NOTE — H&P (View-Only) (Signed)
Cardiology Office Note:    Date:  06/06/2023   ID:  Thomas Harper, DOB 1961-04-20, MRN [id number]  PCP:  Dale Foraker, MD  Cardiologist:  Little Ishikawa, MD  Electrophysiologist:  None   Referring MD: Dale Butler, MD   CC: chest pain   History of Present Illness:    Thomas Harper is a 62 y.o. male with a hx of hypertension, hyperlipidemia, former tobacco use who presents as an ED follow-up for chest pain.  He was seen in the ED on 05/02/2023 with chest pain.  Troponin 22 > 20.  Negative ESR/CRP.  06/06/23: We reviewed results from coronary CTA which I have independently reviewed on PACS.  There is hemodynamically significant disease in the proximal to mid RCA, as well as the mid LAD and mid LCx.  There is also a approximately 20 mm left atrial mass most consistent with myxoma by visual appearance.  This may have been present in CTA chest 2019 but was small not well-visualized on the nongated study.   Patient seen today on behalf of my colleague Dr. Bjorn Pippin to arrange cardiac catheterization.   Patient continues to have chest pain, he mowed the grass yesterday and had continued discomfort similar to his concern from his initial consultation.  We discussed that symptom management will likely be pending revascularization, but given that there is a left atrial myxoma, decision on revascularization strategy is pending results of cardiac catheterization and surgical consultation.  We discussed concerns with myxoma including possible distal embolization and stroke potential.  Initial consult: He reports he was doing heavy yard work on 5/3.  Did not note any chest pain while doing the yard work.  However on 5/5 he woke up during the night with chest pain.  States that it went across his whole chest and was squeezing pain.  He took 2 ibuprofen and went back to sleep.  However woke up at 2 AM on 5/6 with significant chest pain.  Improved with sitting up.  Also reports feeling short of  breath.  Went to ED on 5/6.  Reports since that time he has been having intermittent pain, does appear worse with exertion.  Typically lasts for few minutes.  He denies any lightheadedness, syncope, lower extremity edema, or palpitations.  He smoked 1 to 1.5 packs/day x 40 years, quit age 61.  Family history includes father had MI in 55s.   Past Medical History:  Diagnosis Date   Chronic leg pain    s/p injury right leg (age 76)   GERD (gastroesophageal reflux disease)    History of ankle fracture    persistent pain   Hypercholesterolemia    Hypertension    Nephrolithiasis    followed by Assunta Gambles   Vitamin D deficiency     Past Surgical History:  Procedure Laterality Date   ABDOMINAL SURGERY     after accident.  repaired spleen   LEG SURGERY      Current Medications: Current Meds  Medication Sig   amLODipine (NORVASC) 10 MG tablet Take 1 tablet (10 mg total) by mouth daily.   aspirin EC 81 MG tablet Take 1 tablet (81 mg total) by mouth daily. Swallow whole.   esomeprazole (NEXIUM) 40 MG capsule Take 20 mg by mouth daily.   metoprolol tartrate (LOPRESSOR) 100 MG tablet Take 1 tablet (100mg ) TWO hours prior to CT scan     Allergies:   Fish oil, Lisinopril, and Simvastatin   Social History   Socioeconomic History  Marital status: Married    Spouse name: Not on file   Number of children: Not on file   Years of education: Not on file   Highest education level: Not on file  Occupational History   Not on file  Tobacco Use   Smoking status: Former    Packs/day: 1    Types: Cigarettes    Quit date: 05/21/2021    Years since quitting: 2.0   Smokeless tobacco: Former  Building services engineer Use: Never used  Substance and Sexual Activity   Alcohol use: No    Alcohol/week: 0.0 standard drinks of alcohol   Drug use: Yes    Types: Marijuana   Sexual activity: Not on file  Other Topics Concern   Not on file  Social History Narrative   Not on file   Social Determinants  of Health   Financial Resource Strain: Not on file  Food Insecurity: Not on file  Transportation Needs: Not on file  Physical Activity: Not on file  Stress: Not on file  Social Connections: Not on file     Family History: The patient's family history includes Colon cancer in his father; Diabetes in his father; Heart disease in his father; Hypertension in his father.  ROS:   Please see the history of present illness.     All other systems reviewed and are negative.  EKGs/Labs/Other Studies Reviewed:    The following studies were reviewed today:   EKG: 06/06/2023: Sinus rhythm, inferior infarct 05/02/2023: Normal sinus rhythm, rate 79, no no ST abnormality  Recent Labs: 05/13/2023: ALT 24; BUN 18; Creatinine, Ser 0.94; Hemoglobin 14.4; Platelets 310.0; Potassium 4.4; Sodium 137; TSH 1.57  Recent Lipid Panel    Component Value Date/Time   CHOL 213 (H) 03/18/2023 0821   TRIG 115.0 03/18/2023 0821   HDL 53.80 03/18/2023 0821   CHOLHDL 4 03/18/2023 0821   VLDL 23.0 03/18/2023 0821   LDLCALC 137 (H) 03/18/2023 0821   LDLDIRECT 210.3 11/30/2013 1023    Physical Exam:    VS:  BP (!) 140/82 (BP Location: Left Arm, Patient Position: Sitting, Cuff Size: Normal)   Pulse 82   Ht 5\' 6"  (1.676 m)   Wt 166 lb 3.2 oz (75.4 kg)   SpO2 95%   BMI 26.83 kg/m     Wt Readings from Last 3 Encounters:  06/06/23 166 lb 3.2 oz (75.4 kg)  05/19/23 165 lb 3.2 oz (74.9 kg)  05/13/23 164 lb 6.4 oz (74.6 kg)    Constitutional: No acute distress Eyes: sclera non-icteric, normal conjunctiva and lids ENMT: normal dentition, moist mucous membranes Cardiovascular: regular rhythm, normal rate, no murmur. S1 and S2 normal. No jugular venous distention.  Respiratory: clear to auscultation bilaterally GI : normal bowel sounds, soft and nontender. No distention.   MSK: extremities warm, well perfused. No edema.  NEURO: grossly nonfocal exam, moves all extremities. PSYCH: alert and oriented x 3, normal  mood and affect.    ASSESSMENT:    1. Coronary artery disease of native artery of native heart with stable angina pectoris (HCC)   2. Essential hypertension   3. Hyperlipidemia, unspecified hyperlipidemia type   4. Stable angina pectoris   5. Atrial myxoma    PLAN:    Chest pain:  Left atrial myxoma -Coronary CTA demonstrated multivessel CAD with proximal to mid RCA severe stenosis and mid LAD and LCx stenosis that is hemodynamically significant.  Patient warrants cardiac catheterization for continued progressive angina.  Consent for  cardiac catheterization performed and we participated in shared decision making, patient would like to move forward with left heart catheterization to better define coronary anatomy. INFORMED CONSENT: I have reviewed the risks, indications, and alternatives to cardiac catheterization, possible angioplasty, and stenting with the patient. Risks include but are not limited to bleeding, infection, vascular injury, stroke, myocardial infarction, arrhythmia, kidney injury, radiation-related injury in the case of prolonged fluoroscopy use, emergency cardiac surgery, and death. The patient understands the risks of serious complication is 1-2 in 1000 with diagnostic cardiac cath and 1-2% or less with angioplasty/stenting.  -Echocardiogram to further evaluate left atrial myxoma. -Patient has statin intolerance and by report did not tolerate injectable cholesterol therapy, continue lovastatin for now -Add aspirin 81 mg daily  Hypertension: Continue amlodipine 10 mg daily  Hyperlipidemia: On lovastatin 40 mg daily.  LDL 137 on 03/18/2023.  Defer to primary cardiologist on medication adjustment given patient's above noted statin intolerance and difficulty with injectable cholesterol therapy.  Consider [facility] lipid clinic.   Total time of encounter: 40 minutes total time of encounter, including 30 minutes spent in face-to-face patient care on the date of this encounter. This  time includes coordination of care and counseling regarding above mentioned problem list. Remainder of non-face-to-face time involved reviewing chart documents/testing relevant to the patient encounter and documentation in the medical record. I have independently reviewed documentation from referring provider.   Weston Brass, MD, [facility]  [facility]   Shared Decision Making/Informed Consent The risks [stroke (1 in 1000), death (1 in 1000), kidney failure [usually temporary] (1 in 500), bleeding (1 in 200), allergic reaction [possibly serious] (1 in 200)], benefits (diagnostic support and management of coronary artery disease) and alternatives of a cardiac catheterization were discussed in detail with Thomas Harper and he is willing to proceed.    Medication Adjustments/Labs and Tests Ordered: Current medicines are reviewed at length with the patient today.  Concerns regarding medicines are outlined above.  Orders Placed This Encounter  Procedures   EKG 12-Lead   Meds ordered this encounter  Medications   aspirin EC 81 MG tablet    Sig: Take 1 tablet (81 mg total) by mouth daily. Swallow whole.    Dispense:  90 tablet    Refill:  3    Patient Instructions  Medication Instructions:  START: ASPIRIN 81mg  ONCE DAILY *If you need a refill on your cardiac medications before your next appointment, please call your pharmacy*  Lab Work: None Ordered At This Time.  If you have labs (blood work) drawn today and your tests are completely normal, you will receive your results only by: MyChart Message (if you have MyChart) OR A paper copy in the mail If you have any lab test that is abnormal or we need to change your treatment, we will call you to review the results.  Testing/Procedures:  [place] [facility] [address] [place] [address] [id number] [place] [address] Dept:  [phone] Loc: [phone]  Thomas Harper  06/06/2023  You are scheduled for a Cardiac Catheterization on Thursday, June 13 with Dr. Nicki Guadalajara.  1. Please arrive at the [facility] (Main Entrance A) at [facility]: [address] [place], [place] [address] at 5:30 AM (This time is 2 hour(s) before your procedure to ensure your preparation). Free valet parking service is available. You will check in at ADMITTING. The support person will be asked  to wait in the waiting room.  It is OK to have someone drop you off and come back when you are ready to be discharged.    Special note: Every effort is made to have your procedure done on time. Please understand that emergencies sometimes delay scheduled procedures.  2. Diet: Do not eat solid foods after midnight.  The patient may have clear liquids until 5am upon the day of the procedure.  4. Medication instructions in preparation for your procedure:   Contrast Allergy: No  On the morning of your procedure, take your Aspirin 81 mg and any morning medicines NOT listed above.  You may use sips of water.  5. Plan to go home the same day, you will only stay overnight if medically necessary. 6. Bring a current list of your medications and current insurance cards. 7. You MUST have a responsible person to drive you home. 8. Someone MUST be with you the first 24 hours after you arrive home or your discharge will be delayed. 9. Please wear clothes that are easy to get on and off and wear slip-on shoes.  Thank you for allowing [place] to care for you!   -- [place] [facility]  Follow-Up: At [facility], you and your health needs are our priority.  As part of our continuing mission to provide you with exceptional heart care, we have created designated Provider Care Teams.  These Care Teams include your primary Cardiologist (physician) and Advanced Practice Providers (APPs -  Physician Assistants and Nurse  Practitioners) who all work together to provide you with the care you need, when you need it.  Your next appointment:   2 week(s)  Provider:   Little Ishikawa, MD

## 2023-06-06 NOTE — Telephone Encounter (Signed)
Echo scheduled 6/12 at 1:30 pm at [facility] [place].

## 2023-06-06 NOTE — Patient Instructions (Addendum)
Medication Instructions:  START: ASPIRIN 81mg  ONCE DAILY *If you need a refill on your cardiac medications before your next appointment, please call your pharmacy*  Lab Work: None Ordered At This Time.  If you have labs (blood work) drawn today and your tests are completely normal, you will receive your results only by: MyChart Message (if you have MyChart) OR A paper copy in the mail If you have any lab test that is abnormal or we need to change your treatment, we will call you to review the results.  Testing/Procedures:  [place] [facility] [address] [place] [address] [id number] [place] [address] Dept: [phone] Loc: [phone]  Thomas Harper  06/06/2023  You are scheduled for a Cardiac Catheterization on Thursday, June 13 with Dr. Nicki Guadalajara.  1. Please arrive at the [facility]Main [facility]tr[facility] A[facility] at [facility]: [address] [place], [place] [address] at 5:30 AM (This time is 2 hour(s) before your procedure to ensure your preparation). Free valet parking service is available. You will check in at ADMITTING. The support person will be asked to wait in the waiting room.  It is OK to have someone drop you off and come back when you are ready to be discharged.    Special note: Every effort is made to have your procedure done on time. Please understand that emergencies sometimes delay scheduled procedures.  2. Diet: Do not eat solid foods after midnight.  The patient may have clear liquids until 5am upon the day of the procedure.  4. Medication instructions in preparation for your procedure:   Contrast Allergy: No  On the morning of your procedure, take your Aspirin 81 mg and any morning medicines NOT listed above.  You may use sips of water.  5. Plan to go home the same day, you will only stay overnight if medically necessary. 6. Bring a current list of your medications  and current insurance cards. 7. You MUST have a responsible person to drive you home. 8. Someone MUST be with you the first 24 hours after you arrive home or your discharge will be delayed. 9. Please wear clothes that are easy to get on and off and wear slip-on shoes.  Thank you for allowing [place] to care for you!   --  Invasive Cardiovascular services  Follow-Up: At [facility], you and your health needs are our priority.  As part of our continuing mission to provide you with exceptional heart care, we have created designated Provider Care Teams.  These Care Teams include your primary Cardiologist (physician) and Advanced Practice Providers (APPs -  Physician Assistants and Nurse Practitioners) who all work together to provide you with the care you need, when you need it.  Your next appointment:   2 week(s)  Provider:   Little Ishikawa, MD

## 2023-06-06 NOTE — Telephone Encounter (Signed)
Spoke to patient appointment scheduled with DOD Dr.Acharya today 6/10 at 3:30 pm.

## 2023-06-06 NOTE — Progress Notes (Signed)
Cardiology Office Note:    Date:  06/06/2023   ID:  Thomas Harper, DOB 11/28/1961, MRN [id number]  PCP:  Scott, Charlene, MD  Cardiologist:  Christopher L Schumann, MD  Electrophysiologist:  None   Referring MD: Scott, Charlene, MD   CC: chest pain   History of Present Illness:    Thomas Harper is a 62 y.o. male with a hx of hypertension, hyperlipidemia, former tobacco use who presents as an ED follow-up for chest pain.  He was seen in the ED on 05/02/2023 with chest pain.  Troponin 22 > 20.  Negative ESR/CRP.  06/06/23: We reviewed results from coronary CTA which I have independently reviewed on PACS.  There is hemodynamically significant disease in the proximal to mid RCA, as well as the mid LAD and mid LCx.  There is also a approximately 20 mm left atrial mass most consistent with myxoma by visual appearance.  This may have been present in CTA chest 2019 but was small not well-visualized on the nongated study.   Patient seen today on behalf of my colleague Dr. Schumann to arrange cardiac catheterization.   Patient continues to have chest pain, he mowed the grass yesterday and had continued discomfort similar to his concern from his initial consultation.  We discussed that symptom management will likely be pending revascularization, but given that there is a left atrial myxoma, decision on revascularization strategy is pending results of cardiac catheterization and surgical consultation.  We discussed concerns with myxoma including possible distal embolization and stroke potential.  Initial consult: He reports he was doing heavy yard work on 5/3.  Did not note any chest pain while doing the yard work.  However on 5/5 he woke up during the night with chest pain.  States that it went across his whole chest and was squeezing pain.  He took 2 ibuprofen and went back to sleep.  However woke up at 2 AM on 5/6 with significant chest pain.  Improved with sitting up.  Also reports feeling short of  breath.  Went to ED on 5/6.  Reports since that time he has been having intermittent pain, does appear worse with exertion.  Typically lasts for few minutes.  He denies any lightheadedness, syncope, lower extremity edema, or palpitations.  He smoked 1 to 1.5 packs/day x 40 years, quit age 60.  Family history includes father had MI in 60s.   Past Medical History:  Diagnosis Date   Chronic leg pain    s/p injury right leg (age 20)   GERD (gastroesophageal reflux disease)    History of ankle fracture    persistent pain   Hypercholesterolemia    Hypertension    Nephrolithiasis    followed by Brian Cope   Vitamin D deficiency     Past Surgical History:  Procedure Laterality Date   ABDOMINAL SURGERY     after accident.  repaired spleen   LEG SURGERY      Current Medications: Current Meds  Medication Sig   amLODipine (NORVASC) 10 MG tablet Take 1 tablet (10 mg total) by mouth daily.   aspirin EC 81 MG tablet Take 1 tablet (81 mg total) by mouth daily. Swallow whole.   esomeprazole (NEXIUM) 40 MG capsule Take 20 mg by mouth daily.   metoprolol tartrate (LOPRESSOR) 100 MG tablet Take 1 tablet (100mg) TWO hours prior to CT scan     Allergies:   Fish oil, Lisinopril, and Simvastatin   Social History   Socioeconomic History     Marital status: Married    Spouse name: Not on file   Number of children: Not on file   Years of education: Not on file   Highest education level: Not on file  Occupational History   Not on file  Tobacco Use   Smoking status: Former    Packs/day: 1    Types: Cigarettes    Quit date: 05/21/2021    Years since quitting: 2.0   Smokeless tobacco: Former  Vaping Use   Vaping Use: Never used  Substance and Sexual Activity   Alcohol use: No    Alcohol/week: 0.0 standard drinks of alcohol   Drug use: Yes    Types: Marijuana   Sexual activity: Not on file  Other Topics Concern   Not on file  Social History Narrative   Not on file   Social Determinants  of Health   Financial Resource Strain: Not on file  Food Insecurity: Not on file  Transportation Needs: Not on file  Physical Activity: Not on file  Stress: Not on file  Social Connections: Not on file     Family History: The patient's family history includes Colon cancer in his father; Diabetes in his father; Heart disease in his father; Hypertension in his father.  ROS:   Please see the history of present illness.     All other systems reviewed and are negative.  EKGs/Labs/Other Studies Reviewed:    The following studies were reviewed today:   EKG: 06/06/2023: Sinus rhythm, inferior infarct 05/02/2023: Normal sinus rhythm, rate 79, no no ST abnormality  Recent Labs: 05/13/2023: ALT 24; BUN 18; Creatinine, Ser 0.94; Hemoglobin 14.4; Platelets 310.0; Potassium 4.4; Sodium 137; TSH 1.57  Recent Lipid Panel    Component Value Date/Time   CHOL 213 (H) 03/18/2023 0821   TRIG 115.0 03/18/2023 0821   HDL 53.80 03/18/2023 0821   CHOLHDL 4 03/18/2023 0821   VLDL 23.0 03/18/2023 0821   LDLCALC 137 (H) 03/18/2023 0821   LDLDIRECT 210.3 11/30/2013 1023    Physical Exam:    VS:  BP (!) 140/82 (BP Location: Left Arm, Patient Position: Sitting, Cuff Size: Normal)   Pulse 82   Ht 5' 6" (1.676 m)   Wt 166 lb 3.2 oz (75.4 kg)   SpO2 95%   BMI 26.83 kg/m     Wt Readings from Last 3 Encounters:  06/06/23 166 lb 3.2 oz (75.4 kg)  05/19/23 165 lb 3.2 oz (74.9 kg)  05/13/23 164 lb 6.4 oz (74.6 kg)    Constitutional: No acute distress Eyes: sclera non-icteric, normal conjunctiva and lids ENMT: normal dentition, moist mucous membranes Cardiovascular: regular rhythm, normal rate, no murmur. S1 and S2 normal. No jugular venous distention.  Respiratory: clear to auscultation bilaterally GI : normal bowel sounds, soft and nontender. No distention.   MSK: extremities warm, well perfused. No edema.  NEURO: grossly nonfocal exam, moves all extremities. PSYCH: alert and oriented x 3, normal  mood and affect.    ASSESSMENT:    1. Coronary artery disease of native artery of native heart with stable angina pectoris (HCC)   2. Essential hypertension   3. Hyperlipidemia, unspecified hyperlipidemia type   4. Stable angina pectoris   5. Atrial myxoma    PLAN:    Chest pain:  Left atrial myxoma -Coronary CTA demonstrated multivessel CAD with proximal to mid RCA severe stenosis and mid LAD and LCx stenosis that is hemodynamically significant.  Patient warrants cardiac catheterization for continued progressive angina.  Consent for   cardiac catheterization performed and we participated in shared decision making, patient would like to move forward with left heart catheterization to better define coronary anatomy. INFORMED CONSENT: I have reviewed the risks, indications, and alternatives to cardiac catheterization, possible angioplasty, and stenting with the patient. Risks include but are not limited to bleeding, infection, vascular injury, stroke, myocardial infarction, arrhythmia, kidney injury, radiation-related injury in the case of prolonged fluoroscopy use, emergency cardiac surgery, and death. The patient understands the risks of serious complication is 1-2 in 1000 with diagnostic cardiac cath and 1-2% or less with angioplasty/stenting.  -Echocardiogram to further evaluate left atrial myxoma. -Patient has statin intolerance and by report did not tolerate injectable cholesterol therapy, continue lovastatin for now -Add aspirin 81 mg daily  Hypertension: Continue amlodipine 10 mg daily  Hyperlipidemia: On lovastatin 40 mg daily.  LDL 137 on 03/18/2023.  Defer to primary cardiologist on medication adjustment given patient's above noted statin intolerance and difficulty with injectable cholesterol therapy.  Consider [facility] lipid clinic.   Total time of encounter: 40 minutes total time of encounter, including 30 minutes spent in face-to-face patient care on the date of this encounter. This  time includes coordination of care and counseling regarding above mentioned problem list. Remainder of non-face-to-face time involved reviewing chart documents/testing relevant to the patient encounter and documentation in the medical record. I have independently reviewed documentation from referring provider.   Abdoul Encinas, MD, FACC [place]  [facility]   Shared Decision Making/Informed Consent The risks [stroke (1 in 1000), death (1 in 1000), kidney failure [usually temporary] (1 in 500), bleeding (1 in 200), allergic reaction [possibly serious] (1 in 200)], benefits (diagnostic support and management of coronary artery disease) and alternatives of a cardiac catheterization were discussed in detail with Thomas Harper and he is willing to proceed.    Medication Adjustments/Labs and Tests Ordered: Current medicines are reviewed at length with the patient today.  Concerns regarding medicines are outlined above.  Orders Placed This Encounter  Procedures   EKG 12-Lead   Meds ordered this encounter  Medications   aspirin EC 81 MG tablet    Sig: Take 1 tablet (81 mg total) by mouth daily. Swallow whole.    Dispense:  90 tablet    Refill:  3    Patient Instructions  Medication Instructions:  START: ASPIRIN 81mg ONCE DAILY *If you need a refill on your cardiac medications before your next appointment, please call your pharmacy*  Lab Work: None Ordered At This Time.  If you have labs (blood work) drawn today and your tests are completely normal, you will receive your results only by: MyChart Message (if you have MyChart) OR A paper copy in the mail If you have any lab test that is abnormal or we need to change your treatment, we will call you to review the results.  Testing/Procedures:  [facility] A DEPT OF [place] [id number] [place] [id number] Dept:  [phone] Loc: [phone]  Clemence L Grabert  06/06/2023  You are scheduled for a Cardiac Catheterization on Thursday, June 13 with Dr. Thomas Kelly.  1. Please arrive at the [facility] Tower (Main Entrance A) at [facility]: [address] [place], [place] [address] at 5:30 AM (This time is 2 hour(s) before your procedure to ensure your preparation). Free valet parking service is available. You will check in at ADMITTING. The support person will be asked   to wait in the waiting room.  It is OK to have someone drop you off and come back when you are ready to be discharged.    Special note: Every effort is made to have your procedure done on time. Please understand that emergencies sometimes delay scheduled procedures.  2. Diet: Do not eat solid foods after midnight.  The patient may have clear liquids until 5am upon the day of the procedure.  4. Medication instructions in preparation for your procedure:   Contrast Allergy: No  On the morning of your procedure, take your Aspirin 81 mg and any morning medicines NOT listed above.  You may use sips of water.  5. Plan to go home the same day, you will only stay overnight if medically necessary. 6. Bring a current list of your medications and current insurance cards. 7. You MUST have a responsible person to drive you home. 8. Someone MUST be with you the first 24 hours after you arrive home or your discharge will be delayed. 9. Please wear clothes that are easy to get on and off and wear slip-on shoes.  Thank you for allowing us to care for you!   -- [facility]  Follow-Up: At  HeartCare, you and your health needs are our priority.  As part of our continuing mission to provide you with exceptional heart care, we have created designated Provider Care Teams.  These Care Teams include your primary Cardiologist (physician) and Advanced Practice Providers (APPs -  Physician Assistants and Nurse  Practitioners) who all work together to provide you with the care you need, when you need it.  Your next appointment:   2 week(s)  Provider:   Christopher L Schumann, MD      

## 2023-06-07 ENCOUNTER — Telehealth: Payer: Self-pay

## 2023-06-07 NOTE — Telephone Encounter (Signed)
Spoke to patient follow up appointment scheduled with Dr.Schumann 6/24 at 9:20 am.

## 2023-06-08 ENCOUNTER — Telehealth: Payer: Self-pay | Admitting: *Deleted

## 2023-06-08 ENCOUNTER — Ambulatory Visit (HOSPITAL_BASED_OUTPATIENT_CLINIC_OR_DEPARTMENT_OTHER)
Admission: RE | Admit: 2023-06-08 | Discharge: 2023-06-08 | Disposition: A | Discharge status: Home / Self Care | Payer: 59 | Source: Ambulatory Visit | Attending: Cardiology | Admitting: Cardiology

## 2023-06-08 DIAGNOSIS — R0602 Shortness of breath: Secondary | ICD-10-CM

## 2023-06-08 LAB — ECHOCARDIOGRAM COMPLETE
AR max vel: 2.36 cm2
AV Area VTI: 2.57 cm2
AV Area mean vel: 2.53 cm2
AV Mean grad: 3 mmHg
AV Peak grad: 6.8 mmHg
Ao pk vel: 1.3 m/s
Area-P 1/2: 3.65 cm2
S' Lateral: 3.8 cm

## 2023-06-08 NOTE — Telephone Encounter (Signed)
Cardiac Catheterization scheduled at [facility] for:  Thursday June 09, 2023 7:30 AM Arrival time [facility] Main Entrance A at: 5:30 AM  Nothing to eat after midnight prior to procedure, clear liquids until 5 AM day of procedure.  Medication instructions: -Usual morning medications can be taken with sips of water including aspirin 81 mg.  Confirmed patient has responsible adult to drive home post procedure and be with patient first 24 hours after arriving home.  Plan to go home the same day, you will only stay overnight if medically necessary.  Reviewed procedure instructions with patient.

## 2023-06-09 ENCOUNTER — Encounter (HOSPITAL_COMMUNITY)
Admission: RE | Disposition: A | Discharge status: Home / Self Care | Payer: Self-pay | Source: Home / Self Care | Attending: Cardiovascular Disease

## 2023-06-09 ENCOUNTER — Ambulatory Visit (HOSPITAL_COMMUNITY)
Admission: RE | Admit: 2023-06-09 | Discharge: 2023-06-09 | Disposition: A | Discharge status: Home / Self Care | Payer: 59 | Attending: Cardiovascular Disease | Admitting: Cardiovascular Disease

## 2023-06-09 ENCOUNTER — Telehealth: Payer: Self-pay | Admitting: Internal Medicine

## 2023-06-09 ENCOUNTER — Other Ambulatory Visit: Payer: Self-pay

## 2023-06-09 DIAGNOSIS — I2510 Atherosclerotic heart disease of native coronary artery without angina pectoris: Secondary | ICD-10-CM | POA: Diagnosis not present

## 2023-06-09 DIAGNOSIS — E785 Hyperlipidemia, unspecified: Secondary | ICD-10-CM | POA: Insufficient documentation

## 2023-06-09 DIAGNOSIS — Z87891 Personal history of nicotine dependence: Secondary | ICD-10-CM | POA: Insufficient documentation

## 2023-06-09 DIAGNOSIS — I25118 Atherosclerotic heart disease of native coronary artery with other forms of angina pectoris: Secondary | ICD-10-CM | POA: Diagnosis not present

## 2023-06-09 DIAGNOSIS — Z8249 Family history of ischemic heart disease and other diseases of the circulatory system: Secondary | ICD-10-CM | POA: Insufficient documentation

## 2023-06-09 DIAGNOSIS — Z79899 Other long term (current) drug therapy: Secondary | ICD-10-CM | POA: Insufficient documentation

## 2023-06-09 DIAGNOSIS — D151 Benign neoplasm of heart: Secondary | ICD-10-CM | POA: Insufficient documentation

## 2023-06-09 DIAGNOSIS — I10 Essential (primary) hypertension: Secondary | ICD-10-CM | POA: Insufficient documentation

## 2023-06-09 HISTORY — PX: LEFT HEART CATH AND CORONARY ANGIOGRAPHY: CATH118249

## 2023-06-09 SURGERY — LEFT HEART CATH AND CORONARY ANGIOGRAPHY
Anesthesia: LOCAL

## 2023-06-09 MED ORDER — MIDAZOLAM HCL 2 MG/2ML IJ SOLN
INTRAMUSCULAR | Status: AC
  Filled 2023-06-09: qty 2

## 2023-06-09 MED ORDER — SODIUM CHLORIDE 0.9% FLUSH: 3.0000 mL | Freq: Two times a day (BID) | INTRAVENOUS | Status: DC

## 2023-06-09 MED ORDER — ASPIRIN 81 MG PO CHEW: 81.0000 mg | CHEWABLE_TABLET | Freq: Every day | ORAL | Status: DC

## 2023-06-09 MED ORDER — LIDOCAINE HCL (PF) 1 % IJ SOLN
INTRAMUSCULAR | Status: AC
  Filled 2023-06-09: qty 30

## 2023-06-09 MED ORDER — LABETALOL HCL 5 MG/ML IV SOLN: 10.0000 mg | INTRAVENOUS | Status: DC | PRN

## 2023-06-09 MED ORDER — ACETAMINOPHEN 325 MG PO TABS: 650.0000 mg | ORAL_TABLET | ORAL | Status: DC | PRN

## 2023-06-09 MED ORDER — MIDAZOLAM HCL 2 MG/2ML IJ SOLN
INTRAMUSCULAR | Status: DC | PRN
  Administered 2023-06-09: 1 mg via INTRAVENOUS
  Administered 2023-06-09: 2 mg via INTRAVENOUS

## 2023-06-09 MED ORDER — ONDANSETRON HCL 4 MG/2ML IJ SOLN: 4.0000 mg | Freq: Four times a day (QID) | INTRAMUSCULAR | Status: DC | PRN

## 2023-06-09 MED ORDER — SODIUM CHLORIDE 0.9 % WEIGHT BASED INFUSION
3.0000 mL/kg/h | INTRAVENOUS | Status: AC
  Administered 2023-06-09: 3 mL/kg/h via INTRAVENOUS

## 2023-06-09 MED ORDER — LIDOCAINE HCL (PF) 1 % IJ SOLN
INTRAMUSCULAR | Status: DC | PRN
  Administered 2023-06-09: 2 mL

## 2023-06-09 MED ORDER — HYDRALAZINE HCL 20 MG/ML IJ SOLN: 10.0000 mg | INTRAMUSCULAR | Status: DC | PRN

## 2023-06-09 MED ORDER — FENTANYL CITRATE (PF) 100 MCG/2ML IJ SOLN
INTRAMUSCULAR | Status: DC | PRN
  Administered 2023-06-09: 25 ug via INTRAVENOUS

## 2023-06-09 MED ORDER — HEPARIN SODIUM (PORCINE) 1000 UNIT/ML IJ SOLN
INTRAMUSCULAR | Status: AC
  Filled 2023-06-09: qty 10

## 2023-06-09 MED ORDER — SODIUM CHLORIDE 0.9 % IV SOLN: 250.0000 mL | INTRAVENOUS | Status: DC | PRN

## 2023-06-09 MED ORDER — SODIUM CHLORIDE 0.9% FLUSH: 3.0000 mL | INTRAVENOUS | Status: DC | PRN

## 2023-06-09 MED ORDER — ATORVASTATIN CALCIUM 80 MG PO TABS: 80.0000 mg | ORAL_TABLET | Freq: Every day | ORAL | 11 refills | Status: DC

## 2023-06-09 MED ORDER — SODIUM CHLORIDE 0.9 % IV SOLN: INTRAVENOUS | Status: DC

## 2023-06-09 MED ORDER — ISOSORBIDE MONONITRATE ER 30 MG PO TB24: 30.0000 mg | ORAL_TABLET | Freq: Every day | ORAL | 11 refills | Status: DC

## 2023-06-09 MED ORDER — HEPARIN (PORCINE) IN NACL 1000-0.9 UT/500ML-% IV SOLN
INTRAVENOUS | Status: DC | PRN
  Administered 2023-06-09 (×2): 500 mL

## 2023-06-09 MED ORDER — HEPARIN SODIUM (PORCINE) 1000 UNIT/ML IJ SOLN
INTRAMUSCULAR | Status: DC | PRN
  Administered 2023-06-09: 3800 [IU] via INTRAVENOUS

## 2023-06-09 MED ORDER — METOPROLOL SUCCINATE ER 25 MG PO TB24: 25.0000 mg | ORAL_TABLET | Freq: Every day | ORAL | 11 refills | Status: DC

## 2023-06-09 MED ORDER — SODIUM CHLORIDE 0.9 % WEIGHT BASED INFUSION: 1.0000 mL/kg/h | INTRAVENOUS | Status: DC

## 2023-06-09 MED ORDER — VERAPAMIL HCL 2.5 MG/ML IV SOLN
INTRAVENOUS | Status: AC
  Filled 2023-06-09: qty 2

## 2023-06-09 MED ORDER — IOHEXOL 350 MG/ML SOLN
INTRAVENOUS | Status: DC | PRN
  Administered 2023-06-09: 95 mL

## 2023-06-09 MED ORDER — ASPIRIN 81 MG PO CHEW: 81.0000 mg | CHEWABLE_TABLET | ORAL | Status: AC

## 2023-06-09 MED ORDER — FENTANYL CITRATE (PF) 100 MCG/2ML IJ SOLN
INTRAMUSCULAR | Status: AC
  Filled 2023-06-09: qty 2

## 2023-06-09 MED ORDER — VERAPAMIL HCL 2.5 MG/ML IV SOLN
INTRAVENOUS | Status: DC | PRN
  Administered 2023-06-09: 10 mL via INTRA_ARTERIAL

## 2023-06-09 SURGICAL SUPPLY — 13 items
BAND CMPR LRG ZPHR (HEMOSTASIS) ×1
BAND ZEPHYR COMPRESS 30 LONG (HEMOSTASIS) IMPLANT
CATH INFINITI 5 FR JL3.5 (CATHETERS) IMPLANT
CATH INFINITI JR4 5F (CATHETERS) IMPLANT
CATH OPTITORQUE TIG 4.0 5F (CATHETERS) IMPLANT
GLIDESHEATH SLEND SS 6F .021 (SHEATH) IMPLANT
GUIDEWIRE INQWIRE 1.5J.035X260 (WIRE) IMPLANT
INQWIRE 1.5J .035X260CM (WIRE) ×1
KIT HEART LEFT (KITS) ×1 IMPLANT
PACK CARDIAC CATHETERIZATION (CUSTOM PROCEDURE TRAY) ×1 IMPLANT
SHEATH PROBE COVER 6X72 (BAG) IMPLANT
TRANSDUCER W/STOPCOCK (MISCELLANEOUS) ×1 IMPLANT
TUBING CIL FLEX 10 FLL-RA (TUBING) ×1 IMPLANT

## 2023-06-09 NOTE — Interval H&P Note (Signed)
Cath Lab Visit (complete for each Cath Lab visit)  Clinical Evaluation Leading to the Procedure:   ACS: No.  Non-ACS:    Anginal Classification: CCS III  Anti-ischemic medical therapy: Maximal Therapy (2 or more classes of medications)  Non-Invasive Test Results: Intermediate-risk stress test findings: cardiac mortality 1-3%/year  Prior CABG: No previous CABG      History and Physical Interval Note:  06/09/2023 7:54 AM  Thomas Harper  has presented today for surgery, with the diagnosis of abnormal cta - chest pain.  The various methods of treatment have been discussed with the patient and family. After consideration of risks, benefits and other options for treatment, the patient has consented to  Procedure(s): LEFT HEART CATH AND CORONARY ANGIOGRAPHY (N/A) as a surgical intervention.  The patient's history has been reviewed, patient examined, no change in status, stable for surgery.  I have reviewed the patient's chart and labs.  Questions were answered to the patient's satisfaction.     Nicki Guadalajara

## 2023-06-09 NOTE — Telephone Encounter (Signed)
Pt came into the office stating he is having triple bypass surgery on July 5 at 11am and would like to talk the provider about it

## 2023-06-09 NOTE — Telephone Encounter (Signed)
Do you want me to set him up for an appointment to discuss with you?

## 2023-06-09 NOTE — Progress Notes (Signed)
TR BAND REMOVAL  LOCATION:    right radial  DEFLATED PER PROTOCOL:    Yes.    TIME BAND OFF / DRESSING APPLIED: 06/09/23 at 1030   SITE UPON ARRIVAL:    Level 0  SITE AFTER BAND REMOVAL:    Level 0  CIRCULATION SENSATION AND MOVEMENT:    Within Normal Limits   Yes.    COMMENTS:

## 2023-06-09 NOTE — Telephone Encounter (Signed)
Yes. Please schedule

## 2023-06-09 NOTE — Telephone Encounter (Signed)
LMTCB. Needs appt with Dr Lorin Picket prior to 7/5

## 2023-06-10 ENCOUNTER — Institutional Professional Consult (permissible substitution): Payer: 59 | Admitting: Pulmonary Disease

## 2023-06-10 ENCOUNTER — Encounter (HOSPITAL_COMMUNITY): Payer: Self-pay | Admitting: Cardiovascular Disease

## 2023-06-14 NOTE — Telephone Encounter (Signed)
Called patient to schedule appointment. He decided to hold off on appt. He wanted to make sure you were aware of everything going on. He is scheduled to see cardiology 6/24 and then will see the cardiothoracic surgeon 7/5 unless they move appt up. Pt would like to see them and determine what next steps are prior to scheduling an appt. He has a f/u scheduled for 7/26 with you. Told him we could move appt if needed (if conflicts with surgery, recovery, etc). Patient agreed with this plan. Let me know if anything different needs to be done, other wise this is just an FYI for you.

## 2023-06-16 ENCOUNTER — Telehealth: Payer: Self-pay | Admitting: Cardiology

## 2023-06-16 NOTE — Telephone Encounter (Signed)
Headaches since the 13th when heart cath was done.  Pain level at 7/10. No other symptoms.  States constant all day. Staying hydrated, Tried Tylenol BID and sinus medications with no relief. "Like head is in a vice". Not drinking any caffeine other than one cup of coffee a day. Ask for recommendations or if should be concerned Appt 6/24 with Dr Bjorn Pippin for Follow up.

## 2023-06-16 NOTE — Telephone Encounter (Signed)
Pt's wife returning nurses call. Please advise.  

## 2023-06-16 NOTE — Telephone Encounter (Signed)
New Message:      Patient had a Cath on 06-09-23, have had a headache every since he had it. He can not get rid of it.he take Tylenol 2 times a day.,

## 2023-06-16 NOTE — Telephone Encounter (Signed)
Call to wife, no answer.  LVM to call office

## 2023-06-17 ENCOUNTER — Ambulatory Visit: Payer: 59 | Admitting: Cardiovascular Disease

## 2023-06-17 NOTE — Telephone Encounter (Signed)
Agree with appointment to see Dr. Bjorn Pippin

## 2023-06-17 NOTE — Telephone Encounter (Signed)
Advised patient that Dr Tresa Endo agrees with PCO,.  He will F/U on Monday with Dr Bjorn Pippin

## 2023-06-19 NOTE — Progress Notes (Unsigned)
Cardiology Office Note:    Date:  06/20/2023   ID:  [name], DOB 01-13-1961, MRN [id number]  PCP:  Dale [place], MD  Cardiologist:  Little Ishikawa, MD  Electrophysiologist:  None   Referring MD: Dale [place], MD   Chief Complaint  Patient presents with   Coronary Artery Disease    History of Present Illness:    Thomas Harper is a 62 y.o. male with a hx of CAD, hypertension, hyperlipidemia, former tobacco use who presents for follow-up.  He was seen in the ED on 05/02/2023 with chest pain.  Troponin 22 > 20.  Negative ESR/CRP.  He reports he was doing heavy yard work on 5/3.  Did not note any chest pain while doing the yard work.  However on 5/5 he woke up during the night with chest pain.  States that it went across his whole chest and was squeezing pain.  He took 2 ibuprofen and went back to sleep.  However woke up at 2 AM on 5/6 with significant chest pain.  Improved with sitting up.  Also reports feeling short of breath.  Went to ED on 5/6.  Reports since that time he has been having intermittent pain, does appear worse with exertion.  Typically lasts for few minutes.  He denies any lightheadedness, syncope, lower extremity edema, or palpitations.  He smoked 1 to 1.5 packs/day x 40 years, quit age 62.  Family history includes father had MI in 28s.  Coronary CTA on 06/03/2023 showed severe multivessel CAD, also left atrial mass concerning for myxoma.  Echocardiogram 06/08/2023 showed EF 60 to 65%, normal RV function, left atrial mass attached to the interatrial septum measuring 22 x 16 mm concerning for myxoma, no significant valvular disease.  LHC 06/09/2023 showed severe multivessel CAD (99% mid RCA, 90% mid LAD, 75% mid LCx).  Since last clinic visit, he reports he is doing okay.  Continues to have chest pain with exertion, resolves within 5 minutes of resting.  Also reports some shortness of breath.   Past Medical History:  Diagnosis Date   Chronic leg pain    s/p  injury right leg (age 36)   GERD (gastroesophageal reflux disease)    History of ankle fracture    persistent pain   Hypercholesterolemia    Hypertension    Nephrolithiasis    followed by Assunta Gambles   Vitamin D deficiency     Past Surgical History:  Procedure Laterality Date   ABDOMINAL SURGERY     after accident.  repaired spleen   LEFT HEART CATH AND CORONARY ANGIOGRAPHY N/A 06/09/2023   Procedure: LEFT HEART CATH AND CORONARY ANGIOGRAPHY;  Surgeon: Lennette Bihari, MD;  Location: [facility];  Service: Cardiovascular;  Laterality: N/A;   LEG SURGERY      Current Medications: Current Meds  Medication Sig   aspirin EC 81 MG tablet Take 1 tablet (81 mg total) by mouth daily. Swallow whole.   atorvastatin (LIPITOR) 80 MG tablet Take 1 tablet (80 mg total) by mouth daily.   Esomeprazole Magnesium 20 MG TBEC Take 20 mg by mouth daily.   isosorbide mononitrate (IMDUR) 30 MG 24 hr tablet Take 1 tablet (30 mg total) by mouth daily.   loratadine (CLARITIN) 10 MG tablet Take 10 mg by mouth daily as needed for allergies.   metoprolol tartrate (LOPRESSOR) 100 MG tablet Take 1 tablet (100mg ) TWO hours prior to CT scan   nitroGLYCERIN (NITROSTAT) 0.4 MG SL tablet Place  1 tablet (0.4 mg total) under the tongue every 5 (five) minutes as needed for chest pain.     Allergies:   Fish oil, Lisinopril, and Simvastatin   Social History   Socioeconomic History   Marital status: Married    Spouse name: Not on file   Number of children: Not on file   Years of education: Not on file   Highest education level: Not on file  Occupational History   Not on file  Tobacco Use   Smoking status: Former    Packs/day: 1    Types: Cigarettes    Quit date: 05/21/2021    Years since quitting: 2.0   Smokeless tobacco: Former  Building services engineer Use: Never used  Substance and Sexual Activity   Alcohol use: No    Alcohol/week: 0.0 standard drinks of alcohol   Drug use: Yes    Types: Marijuana    Sexual activity: Not on file  Other Topics Concern   Not on file  Social History Narrative   Not on file   Social Determinants of Health   Financial Resource Strain: Not on file  Food Insecurity: Not on file  Transportation Needs: Not on file  Physical Activity: Not on file  Stress: Not on file  Social Connections: Not on file     Family History: The patient's family history includes Colon cancer in his father; Diabetes in his father; Heart disease in his father; Hypertension in his father.  ROS:   Please see the history of present illness.     All other systems reviewed and are negative.  EKGs/Labs/Other Studies Reviewed:    The following studies were reviewed today:   EKG:   05/02/2023: Normal sinus rhythm, rate 79, no no ST abnormality 06/20/2023: Normal sinus rhythm, rate 60, T wave inversion in lead III, aVF  Recent Labs: 05/13/2023: ALT 24; BUN 18; Creatinine, Ser 0.94; Hemoglobin 14.4; Platelets 310.0; Potassium 4.4; Sodium 137; TSH 1.57  Recent Lipid Panel    Component Value Date/Time   CHOL 213 (H) 03/18/2023 0821   TRIG 115.0 03/18/2023 0821   HDL 53.80 03/18/2023 0821   CHOLHDL 4 03/18/2023 0821   VLDL 23.0 03/18/2023 0821   LDLCALC 137 (H) 03/18/2023 0821   LDLDIRECT 210.3 11/30/2013 1023    Physical Exam:    VS:  BP (!) 144/76 (BP Location: Left Arm, Patient Position: Sitting, Cuff Size: Normal)   Pulse 60   Ht 5\' 6"  (1.676 m)   Wt 167 lb (75.8 kg)   SpO2 96%   BMI 26.95 kg/m     Wt Readings from Last 3 Encounters:  06/20/23 167 lb (75.8 kg)  06/06/23 166 lb 3.2 oz (75.4 kg)  05/19/23 165 lb 3.2 oz (74.9 kg)     GEN:  Well nourished, well developed in no acute distress HEENT: Normal NECK: No JVD; No carotid bruits LYMPHATICS: No lymphadenopathy CARDIAC: RRR, no murmurs, rubs, gallops RESPIRATORY:  Clear to auscultation without rales, wheezing or rhonchi  ABDOMEN: Soft, non-tender, non-distended MUSCULOSKELETAL:  No edema; No deformity   SKIN: Warm and dry NEUROLOGIC:  Alert and oriented x 3 PSYCHIATRIC:  Normal affect   ASSESSMENT:    1. Coronary artery disease of native artery of native heart with stable angina pectoris (HCC)   2. Left atrial mass   3. Essential hypertension   4. Hyperlipidemia, unspecified hyperlipidemia type   5. Chest pain, unspecified type     PLAN:    CAD: Reporting typical  chest pain.  Coronary CTA on 06/03/2023 showed severe multivessel CAD, also left atrial mass concerning for myxoma.  Echocardiogram 06/08/2023 showed EF 60 to 65%, normal RV function, left atrial mass attached to the interatrial septum measuring 22 x 16 mm concerning for myxoma, no significant valvular disease.  LHC 06/09/2023 showed severe multivessel CAD (99% mid RCA, 90% mid LAD, 75% mid LCx). -Referred to cardiac surgery for evaluation for CABG and suspected left atrial myxoma resection, has appointment with Dr. Cliffton Asters on 7/5 -Continue aspirin, statin  -Continue Toprol-XL 25 mg daily and Imdur 30 mg daily -As needed SL NTG.  Currently having symptoms consistent with stable angina.  Discussed that if having chest pain that does not resolve with nitroglycerin x 2, should call EMS  Left atrial mass: Noted on coronary CT, confirmed on echocardiogram.  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  Planning cardiac surgery evaluation  Hypertension: Continue amlodipine 10 mg daily, Toprol-XL 25 mg daily, Imdur 30 mg daily  Hyperlipidemia: On lovastatin 40 mg daily, LDL 137 on 03/18/2023.  Switched to atorvastatin 80 mg daily given results of coronary CTA as above  RTC in 3 months   Medication Adjustments/Labs and Tests Ordered: Current medicines are reviewed at length with the patient today.  Concerns regarding medicines are outlined above.  Orders Placed This Encounter  Procedures   EKG 12-Lead   Meds ordered this encounter  Medications   nitroGLYCERIN (NITROSTAT) 0.4 MG SL tablet    Sig: Place 1 tablet (0.4 mg total) under  the tongue every 5 (five) minutes as needed for chest pain.    Dispense:  25 tablet    Refill:  3    Patient Instructions  Medication Instructions:  Prescription for Nitroglycerin 0.4mg  use as needed for chest pain  *If you need a refill on your cardiac medications before your next appointment, please call your pharmacy*   Lab Work: No labs ordered If you have labs (blood work) drawn today and your tests are completely normal, you will receive your results only by: MyChart Message (if you have MyChart) OR A paper copy in the mail If you have any lab test that is abnormal or we need to change your treatment, we will call you to review the results.   Testing/Procedures: No testing ordered   Follow-Up: At [facility], you and your health needs are our priority.  As part of our continuing mission to provide you with exceptional heart care, we have created designated Provider Care Teams.  These Care Teams include your primary Cardiologist (physician) and Advanced Practice Providers (APPs -  Physician Assistants and Nurse Practitioners) who all work together to provide you with the care you need, when you need it.  We recommend signing up for the patient portal called "MyChart".  Sign up information is provided on this After Visit Summary.  MyChart is used to connect with patients for Virtual Visits (Telemedicine).  Patients are able to view lab/test results, encounter notes, upcoming appointments, etc.  Non-urgent messages can be sent to your provider as well.   To learn more about what you can do with MyChart, go to [email or web address].    Your next appointment:   2-3 month(s)  Provider:   Little Ishikawa, MD        Signed, Little Ishikawa, MD  06/20/2023 10:11 AM    [facility]

## 2023-06-20 ENCOUNTER — Ambulatory Visit: Payer: 59 | Attending: Cardiology | Admitting: Cardiology

## 2023-06-20 ENCOUNTER — Encounter: Payer: Self-pay | Admitting: Internal Medicine

## 2023-06-20 ENCOUNTER — Encounter: Payer: Self-pay | Admitting: Cardiology

## 2023-06-20 VITALS — BP 144/76 | HR 60 | Ht 66.0 in | Wt 167.0 lb

## 2023-06-20 DIAGNOSIS — R079 Chest pain, unspecified: Secondary | ICD-10-CM

## 2023-06-20 DIAGNOSIS — I5189 Other ill-defined heart diseases: Secondary | ICD-10-CM

## 2023-06-20 DIAGNOSIS — I2510 Atherosclerotic heart disease of native coronary artery without angina pectoris: Secondary | ICD-10-CM | POA: Insufficient documentation

## 2023-06-20 DIAGNOSIS — I25118 Atherosclerotic heart disease of native coronary artery with other forms of angina pectoris: Secondary | ICD-10-CM | POA: Diagnosis not present

## 2023-06-20 DIAGNOSIS — I10 Essential (primary) hypertension: Secondary | ICD-10-CM

## 2023-06-20 DIAGNOSIS — E785 Hyperlipidemia, unspecified: Secondary | ICD-10-CM | POA: Diagnosis not present

## 2023-06-20 MED ORDER — NITROGLYCERIN 0.4 MG SL SUBL
0.4000 mg | SUBLINGUAL_TABLET | SUBLINGUAL | 3 refills | Status: DC | PRN
Start: 2023-06-20 — End: 2023-07-18

## 2023-06-20 NOTE — Patient Instructions (Signed)
Medication Instructions:  Prescription for Nitroglycerin 0.4mg  use as needed for chest pain  *If you need a refill on your cardiac medications before your next appointment, please call your pharmacy*   Lab Work: No labs ordered If you have labs (blood work) drawn today and your tests are completely normal, you will receive your results only by: MyChart Message (if you have MyChart) OR A paper copy in the mail If you have any lab test that is abnormal or we need to change your treatment, we will call you to review the results.   Testing/Procedures: No testing ordered   Follow-Up: At [facility], you and your health needs are our priority.  As part of our continuing mission to provide you with exceptional heart care, we have created designated Provider Care Teams.  These Care Teams include your primary Cardiologist (physician) and Advanced Practice Providers (APPs -  Physician Assistants and Nurse Practitioners) who all work together to provide you with the care you need, when you need it.  We recommend signing up for the patient portal called "MyChart".  Sign up information is provided on this After Visit Summary.  MyChart is used to connect with patients for Virtual Visits (Telemedicine).  Patients are able to view lab/test results, encounter notes, upcoming appointments, etc.  Non-urgent messages can be sent to your provider as well.   To learn more about what you can do with MyChart, go to [email or web address].    Your next appointment:   2-3 month(s)  Provider:   Little Ishikawa, MD

## 2023-06-24 ENCOUNTER — Other Ambulatory Visit (HOSPITAL_COMMUNITY): Payer: 59

## 2023-07-01 ENCOUNTER — Institutional Professional Consult (permissible substitution): Payer: 59 | Admitting: Thoracic Surgery (Cardiothoracic Vascular Surgery)

## 2023-07-01 ENCOUNTER — Encounter: Payer: Self-pay | Admitting: Thoracic Surgery (Cardiothoracic Vascular Surgery)

## 2023-07-01 ENCOUNTER — Encounter: Payer: Self-pay | Admitting: *Deleted

## 2023-07-01 ENCOUNTER — Other Ambulatory Visit: Payer: Self-pay | Admitting: *Deleted

## 2023-07-01 VITALS — BP 132/80 | HR 65 | Resp 20 | Ht 66.0 in | Wt 169.0 lb

## 2023-07-01 DIAGNOSIS — I25119 Atherosclerotic heart disease of native coronary artery with unspecified angina pectoris: Secondary | ICD-10-CM

## 2023-07-01 DIAGNOSIS — D151 Benign neoplasm of heart: Secondary | ICD-10-CM | POA: Diagnosis not present

## 2023-07-01 NOTE — H&P (View-Only) (Signed)
 [address]       [place] [address]             [phone]        Thomas Harper [facility] Medical Record #[id number] Date of Birth: [birth year]  Referring: Lennette Bihari, MD Primary Care: Dale Cameron, MD Primary Cardiologist:Christopher Karlyne Greenspan, MD  Chief Complaint:    Chief Complaint  Patient presents with   Coronary Artery Disease   Atrial Mass    New patient consultation, CATH 6/13, ECHO 6/12, Coronary CT 6/7    History of Present Illness:     Thomas Harper 62 y.o. male presents for surgical evaluation of 3V CAD, and a left atrial myxoma.  He has had progressive shortness of breath, and anginal pain for the past several months.  This prompted his evaluation.  He is symptomatic with activity only at this point.     Past Medical and Surgical History: Previous Chest Surgery: no Previous Chest Radiation: no Diabetes Mellitus: no.  HbA1C 6.2 Creatinine:  Lab Results  Component Value Date   CREATININE 0.94 05/13/2023   CREATININE 0.84 05/02/2023   CREATININE 0.93 03/18/2023     Past Medical History:  Diagnosis Date   Chronic leg pain    s/p injury right leg (age 89)   GERD (gastroesophageal reflux disease)    History of ankle fracture    persistent pain   Hypercholesterolemia    Hypertension    Nephrolithiasis    followed by Assunta Gambles   Vitamin D deficiency     Past Surgical History:  Procedure Laterality Date   ABDOMINAL SURGERY     after accident.  repaired spleen   LEFT HEART CATH AND CORONARY ANGIOGRAPHY N/A 06/09/2023   Procedure: LEFT HEART CATH AND CORONARY ANGIOGRAPHY;  Surgeon: Lennette Bihari, MD;  Location: [facility];  Service: Cardiovascular;  Laterality: N/A;   LEG SURGERY      Social History:  Social History   Tobacco Use  Smoking Status Former   Packs/day: 1   Types: Cigarettes   Quit date: 05/21/2021   Years since quitting: 2.1  Smokeless Tobacco Former    Social History   Substance  and Sexual Activity  Alcohol Use No   Alcohol/week: 0.0 standard drinks of alcohol     Allergies  Allergen Reactions   Fish Oil Rash   Lisinopril Other (See Comments)    Fatigue   Simvastatin Rash      Current Outpatient Medications  Medication Sig Dispense Refill   amLODipine (NORVASC) 10 MG tablet Take 1 tablet (10 mg total) by mouth daily. 90 tablet 1   aspirin EC 81 MG tablet Take 1 tablet (81 mg total) by mouth daily. Swallow whole. 90 tablet 3   atorvastatin (LIPITOR) 80 MG tablet Take 1 tablet (80 mg total) by mouth daily. 30 tablet 11   b complex vitamins capsule Take 1 capsule by mouth daily.     DM-Doxylamine-Acetaminophen (NYQUIL HBP COLD & FLU) 15-6.25-325 MG/15ML LIQD Take 30 mLs by mouth at bedtime.     Esomeprazole Magnesium 20 MG TBEC Take 20 mg by mouth daily.     ibuprofen (ADVIL) 200 MG tablet Take 400 mg by mouth every 6 (six) hours as needed for moderate pain, mild pain or fever.     isosorbide mononitrate (IMDUR) 30 MG 24 hr tablet Take 1 tablet (30 mg total) by mouth daily. 30 tablet 11   loratadine (  CLARITIN) 10 MG tablet Take 10 mg by mouth daily as needed for allergies.     metoprolol succinate (TOPROL XL) 25 MG 24 hr tablet Take 1 tablet (25 mg total) by mouth daily. 30 tablet 11   metoprolol tartrate (LOPRESSOR) 100 MG tablet Take 1 tablet (100mg ) TWO hours prior to CT scan 1 tablet 0   nitroGLYCERIN (NITROSTAT) 0.4 MG SL tablet Place 1 tablet (0.4 mg total) under the tongue every 5 (five) minutes as needed for chest pain. 25 tablet 3   phenylephrine (SUDAFED PE) 10 MG TABS tablet Take 10 mg by mouth every 4 (four) hours as needed (Cold Symptoms).     No current facility-administered medications for this visit.    (Not in a hospital admission)   Family History  Problem Relation Age of Onset   Colon cancer Father    Heart disease Father        has a pacemaker and defibrillator   Hypertension Father    Diabetes Father      Review of Systems:    Review of Systems  Constitutional:  Positive for malaise/fatigue.  Respiratory:  Positive for shortness of breath.   Cardiovascular:  Positive for chest pain.  Neurological: Negative.       Physical Exam: BP 132/80 (BP Location: Right Arm, Patient Position: Sitting, Cuff Size: Normal)   Pulse 65   Resp 20   Ht 5\' 6"  (1.676 m)   Wt 169 lb (76.7 kg)   SpO2 94% Comment: RA  BMI 27.28 kg/m  Physical Exam Constitutional:      General: He is not in acute distress.    Appearance: Normal appearance. He is not ill-appearing.  HENT:     Head: Normocephalic and atraumatic.  Eyes:     Extraocular Movements: Extraocular movements intact.  Cardiovascular:     Rate and Rhythm: Normal rate.  Pulmonary:     Effort: Pulmonary effort is normal. No respiratory distress.  Chest:    Abdominal:     General: Abdomen is flat. There is no distension.  Musculoskeletal:        General: Normal range of motion.     Cervical back: Normal range of motion.  Skin:    General: Skin is warm and dry.  Neurological:     General: No focal deficit present.     Mental Status: He is alert and oriented to person, place, and time.       Diagnostic Studies & Laboratory data:    Left Heart Catherization:  Intervention  Echo: IMPRESSIONS     1. Left ventricular ejection fraction, by estimation, is 60 to 65%. The  left ventricle has normal function. The left ventricle has no regional  wall motion abnormalities. Left ventricular diastolic parameters are  consistent with Grade I diastolic  dysfunction (impaired relaxation).   2. Right ventricular systolic function is normal. The right ventricular  size is normal.   3. Mass measuring 22x16 mm attached to intraatrial sepum on the left  atrial side noted. Differencial should include myxoma - most likely v/s  thrombus.   4. The mitral valve is normal in structure. Mild mitral valve  regurgitation. No evidence of mitral stenosis.   5. The aortic  valve is normal in structure. Aortic valve regurgitation is  not visualized. No aortic stenosis is present.   6. The inferior vena cava is normal in size with greater than 50%  respiratory variability, suggesting right atrial pressure of 3 mmHg.   Comparison(s): CT  calcium score 06/03/2023- Pedunculated well circumscribed  20 X 24 mm x 16 mm mass attached to the fossa ovalis of the left atrial  septum with a small stalk. This is concerning for atrial myxoma.    EKG: Sinus I have independently reviewed the above radiologic studies and discussed with the patient   Recent Lab Findings: Lab Results  Component Value Date   WBC 7.2 05/13/2023   HGB 14.4 05/13/2023   HCT 42.4 05/13/2023   PLT 310.0 05/13/2023   GLUCOSE 85 05/13/2023   CHOL 213 (H) 03/18/2023   TRIG 115.0 03/18/2023   HDL 53.80 03/18/2023   LDLDIRECT 210.3 11/30/2013   LDLCALC 137 (H) 03/18/2023   ALT 24 05/13/2023   AST 22 05/13/2023   NA 137 05/13/2023   K 4.4 05/13/2023   CL 102 05/13/2023   CREATININE 0.94 05/13/2023   BUN 18 05/13/2023   CO2 27 05/13/2023   TSH 1.57 05/13/2023   HGBA1C 6.2 03/18/2023      Assessment / Plan:   62yo male with 3V CAD, a left atrial myxoma, and chest wall cyst.  His echo shows preserved biventricular function, and no significant valvular disease.  We discussed the risks and benefit for CABG, left atrial myxoma resection, and chest wall cyst removal.  He is agreeable to proceed. (LAD, PDA, OM.)      I  spent 40 minutes counseling the patient face to face.   Corliss Skains 07/01/2023 11:26 AM

## 2023-07-01 NOTE — Progress Notes (Signed)
[address]       [place] [address]             [phone]        Thomas Harper [facility] Medical Record #[id number] Date of Birth: [birth year]  Referring: Lennette Bihari, MD Primary Care: Dale Cameron, MD Primary Cardiologist:Christopher Karlyne Greenspan, MD  Chief Complaint:    Chief Complaint  Patient presents with   Coronary Artery Disease   Atrial Mass    New patient consultation, CATH 6/13, ECHO 6/12, Coronary CT 6/7    History of Present Illness:     Thomas Harper 62 y.o. male presents for surgical evaluation of 3V CAD, and a left atrial myxoma.  He has had progressive shortness of breath, and anginal pain for the past several months.  This prompted his evaluation.  He is symptomatic with activity only at this point.     Past Medical and Surgical History: Previous Chest Surgery: no Previous Chest Radiation: no Diabetes Mellitus: no.  HbA1C 6.2 Creatinine:  Lab Results  Component Value Date   CREATININE 0.94 05/13/2023   CREATININE 0.84 05/02/2023   CREATININE 0.93 03/18/2023     Past Medical History:  Diagnosis Date   Chronic leg pain    s/p injury right leg (age 89)   GERD (gastroesophageal reflux disease)    History of ankle fracture    persistent pain   Hypercholesterolemia    Hypertension    Nephrolithiasis    followed by Assunta Gambles   Vitamin D deficiency     Past Surgical History:  Procedure Laterality Date   ABDOMINAL SURGERY     after accident.  repaired spleen   LEFT HEART CATH AND CORONARY ANGIOGRAPHY N/A 06/09/2023   Procedure: LEFT HEART CATH AND CORONARY ANGIOGRAPHY;  Surgeon: Lennette Bihari, MD;  Location: [facility] INVASIVE CV LAB;  Service: Cardiovascular;  Laterality: N/A;   LEG SURGERY      Social History:  Social History   Tobacco Use  Smoking Status Former   Packs/day: 1   Types: Cigarettes   Quit date: 05/21/2021   Years since quitting: 2.1  Smokeless Tobacco Former    Social History   Substance  and Sexual Activity  Alcohol Use No   Alcohol/week: 0.0 standard drinks of alcohol     Allergies  Allergen Reactions   Fish Oil Rash   Lisinopril Other (See Comments)    Fatigue   Simvastatin Rash      Current Outpatient Medications  Medication Sig Dispense Refill   amLODipine (NORVASC) 10 MG tablet Take 1 tablet (10 mg total) by mouth daily. 90 tablet 1   aspirin EC 81 MG tablet Take 1 tablet (81 mg total) by mouth daily. Swallow whole. 90 tablet 3   atorvastatin (LIPITOR) 80 MG tablet Take 1 tablet (80 mg total) by mouth daily. 30 tablet 11   b complex vitamins capsule Take 1 capsule by mouth daily.     DM-Doxylamine-Acetaminophen (NYQUIL HBP COLD & FLU) 15-6.25-325 MG/15ML LIQD Take 30 mLs by mouth at bedtime.     Esomeprazole Magnesium 20 MG TBEC Take 20 mg by mouth daily.     ibuprofen (ADVIL) 200 MG tablet Take 400 mg by mouth every 6 (six) hours as needed for moderate pain, mild pain or fever.     isosorbide mononitrate (IMDUR) 30 MG 24 hr tablet Take 1 tablet (30 mg total) by mouth daily. 30 tablet 11   loratadine (  CLARITIN) 10 MG tablet Take 10 mg by mouth daily as needed for allergies.     metoprolol succinate (TOPROL XL) 25 MG 24 hr tablet Take 1 tablet (25 mg total) by mouth daily. 30 tablet 11   metoprolol tartrate (LOPRESSOR) 100 MG tablet Take 1 tablet (100mg ) TWO hours prior to CT scan 1 tablet 0   nitroGLYCERIN (NITROSTAT) 0.4 MG SL tablet Place 1 tablet (0.4 mg total) under the tongue every 5 (five) minutes as needed for chest pain. 25 tablet 3   phenylephrine (SUDAFED PE) 10 MG TABS tablet Take 10 mg by mouth every 4 (four) hours as needed (Cold Symptoms).     No current facility-administered medications for this visit.    (Not in a hospital admission)   Family History  Problem Relation Age of Onset   Colon cancer Father    Heart disease Father        has a pacemaker and defibrillator   Hypertension Father    Diabetes Father      Review of Systems:    Review of Systems  Constitutional:  Positive for malaise/fatigue.  Respiratory:  Positive for shortness of breath.   Cardiovascular:  Positive for chest pain.  Neurological: Negative.       Physical Exam: BP 132/80 (BP Location: Right Arm, Patient Position: Sitting, Cuff Size: Normal)   Pulse 65   Resp 20   Ht 5\' 6"  (1.676 m)   Wt 169 lb (76.7 kg)   SpO2 94% Comment: RA  BMI 27.28 kg/m  Physical Exam Constitutional:      General: He is not in acute distress.    Appearance: Normal appearance. He is not ill-appearing.  HENT:     Head: Normocephalic and atraumatic.  Eyes:     Extraocular Movements: Extraocular movements intact.  Cardiovascular:     Rate and Rhythm: Normal rate.  Pulmonary:     Effort: Pulmonary effort is normal. No respiratory distress.  Chest:    Abdominal:     General: Abdomen is flat. There is no distension.  Musculoskeletal:        General: Normal range of motion.     Cervical back: Normal range of motion.  Skin:    General: Skin is warm and dry.  Neurological:     General: No focal deficit present.     Mental Status: He is alert and oriented to person, place, and time.       Diagnostic Studies & Laboratory data:    Left Heart Catherization:  Intervention  Echo: IMPRESSIONS     1. Left ventricular ejection fraction, by estimation, is 60 to 65%. The  left ventricle has normal function. The left ventricle has no regional  wall motion abnormalities. Left ventricular diastolic parameters are  consistent with Grade I diastolic  dysfunction (impaired relaxation).   2. Right ventricular systolic function is normal. The right ventricular  size is normal.   3. Mass measuring 22x16 mm attached to intraatrial sepum on the left  atrial side noted. Differencial should include myxoma - most likely v/s  thrombus.   4. The mitral valve is normal in structure. Mild mitral valve  regurgitation. No evidence of mitral stenosis.   5. The aortic  valve is normal in structure. Aortic valve regurgitation is  not visualized. No aortic stenosis is present.   6. The inferior vena cava is normal in size with greater than 50%  respiratory variability, suggesting right atrial pressure of 3 mmHg.   Comparison(s): CT  calcium score 06/03/2023- Pedunculated well circumscribed  20 X 24 mm x 16 mm mass attached to the fossa ovalis of the left atrial  septum with a small stalk. This is concerning for atrial myxoma.    EKG: Sinus I have independently reviewed the above radiologic studies and discussed with the patient   Recent Lab Findings: Lab Results  Component Value Date   WBC 7.2 05/13/2023   HGB 14.4 05/13/2023   HCT 42.4 05/13/2023   PLT 310.0 05/13/2023   GLUCOSE 85 05/13/2023   CHOL 213 (H) 03/18/2023   TRIG 115.0 03/18/2023   HDL 53.80 03/18/2023   LDLDIRECT 210.3 11/30/2013   LDLCALC 137 (H) 03/18/2023   ALT 24 05/13/2023   AST 22 05/13/2023   NA 137 05/13/2023   K 4.4 05/13/2023   CL 102 05/13/2023   CREATININE 0.94 05/13/2023   BUN 18 05/13/2023   CO2 27 05/13/2023   TSH 1.57 05/13/2023   HGBA1C 6.2 03/18/2023      Assessment / Plan:   62yo male with 3V CAD, a left atrial myxoma, and chest wall cyst.  His echo shows preserved biventricular function, and no significant valvular disease.  We discussed the risks and benefit for CABG, left atrial myxoma resection, and chest wall cyst removal.  He is agreeable to proceed. (LAD, PDA, OM.)      I  spent 40 minutes counseling the patient face to face.   Corliss Skains 07/01/2023 11:26 AM

## 2023-07-05 ENCOUNTER — Telehealth: Payer: Self-pay | Admitting: Cardiology

## 2023-07-05 NOTE — Telephone Encounter (Signed)
Called pt's wife- the patient is also on the phone via speaker phone.  Pt's wife was given a note for work saying that she needed to work remotely through July 5th; but now her work is asking for another note for her to continue working remotely. She would need this note to at least cover from July 5th through the 17th. Her husband is scheduled for CABG 07/13/23; Maybe the Cardiothoracic surgeon will write a note for her- Post procedure?   Informed her that I would send this information to Dr Bjorn Pippin and we will get back in touch with her.

## 2023-07-05 NOTE — Telephone Encounter (Signed)
Pt's wife would like a callback regarding a letter to be excused for work to care for pt after his upcoming procedure. She also has questions regarding Short Term Disability. Please advise

## 2023-07-06 NOTE — Telephone Encounter (Signed)
That is fine, can we write a note saying she needs to work remotely through 7/17?

## 2023-07-06 NOTE — Telephone Encounter (Signed)
Spoke to patient's wife.Dr.Schumann advised ok to write a note to work remotely.She requested to work remotely through 9/2.Letter left at front desk for pick up.

## 2023-07-08 ENCOUNTER — Telehealth: Payer: Self-pay | Admitting: *Deleted

## 2023-07-08 NOTE — Telephone Encounter (Signed)
RN called patient and spoke to wife.  RN informed patient he has refills available at pharmacy - he will need to call in refill for medications.  Patient state she did not realize there were refills on prescription bottles.  Per patient,"this is all new "    RN also informed patient will need to await for Dr Bjorn Pippin reply for something for anxiety - patient states he just needed for a few days  until surgery. Patient is aware Dr Bjorn Pippin is out of the office.

## 2023-07-08 NOTE — Telephone Encounter (Signed)
Patient  came to office   - informed  front desk - he needs  refills on Metoprolol succinate 25 mg , Imdur 30 mg ,  Atorvastatin 80 mg    Patient also request something for stress and anxiety for next week prior to having surgery.

## 2023-07-08 NOTE — Pre-Procedure Instructions (Signed)
Surgical Instructions   Your procedure is scheduled on July 13, 2023. Report to [facility] Main Entrance "A" at 6:30 A.M., then check in with the Admitting office. Any questions or running late day of surgery: call [phone]  Questions prior to your surgery date: call [phone], Monday-Friday, 8am-4pm. If you experience any cold or flu symptoms such as cough, fever, chills, shortness of breath, etc. between now and your scheduled surgery, please notify us at the above number.     Remember:  Do not eat or drink after midnight the night before your surgery     Take these medicines the morning of surgery with A SIP OF WATER:  atorvastatin (LIPITOR)   esomeprazole (NEXIUM)   isosorbide mononitrate (IMDUR)     May take these medicines IF NEEDED:  acetaminophen (TYLENOL)   loratadine (CLARITIN)   nitroGLYCERIN (NITROSTAT) - please notify hospital staff at [phone] if dose taken prior to surgery   One week prior to surgery, STOP taking any Aleve, Naproxen, Ibuprofen, Motrin, Advil, Goody's, BC's, all herbal medications, fish oil, and non-prescription vitamins.                     Do NOT Smoke (Tobacco/Vaping) for 24 hours prior to your procedure.  If you use a CPAP at night, you may bring your mask/headgear for your overnight stay.   You will be asked to remove any contacts, glasses, piercing's, hearing aid's, dentures/partials prior to surgery. Please bring cases for these items if needed.    Patients discharged the day of surgery will not be allowed to drive home, and someone needs to stay with them for 24 hours.  SURGICAL WAITING ROOM VISITATION Patients may have no more than 2 support people in the waiting area - these visitors may rotate.   Pre-op nurse will coordinate an appropriate time for 1 ADULT support person, who may not rotate, to accompany patient in pre-op.  Children under the age of 66 must have an adult with them who is not the patient and must remain in  the main waiting area with an adult.  If the patient needs to stay at the hospital during part of their recovery, the visitor guidelines for inpatient rooms apply.  Please refer to the [facility] website for the visitor guidelines for any additional information.   If you received a COVID test during your pre-op visit  it is requested that you wear a mask when out in public, stay away from anyone that may not be feeling well and notify your surgeon if you develop symptoms. If you have been in contact with anyone that has tested positive in the last 10 days please notify you surgeon.      Pre-operative CHG Bathing Instructions   You can play a key role in reducing the risk of infection after surgery. Your skin needs to be as free of germs as possible. You can reduce the number of germs on your skin by washing with CHG (chlorhexidine gluconate) soap before surgery. CHG is an antiseptic soap that kills germs and continues to kill germs even after washing.   DO NOT use if you have an allergy to chlorhexidine/CHG or antibacterial soaps. If your skin becomes reddened or irritated, stop using the CHG and notify one of our RNs at [phone].   TAKE A SHOWER THE NIGHT BEFORE SURGERY AND THE DAY OF SURGERY    Please keep in mind the following:  DO NOT shave, including legs and underarms, 48  hours prior to surgery.   You may shave your face before/day of surgery.  Place clean sheets on your bed the night before surgery Use a clean washcloth (not used since being washed) for each shower. DO NOT sleep with pet's night before surgery.  CHG Shower Instructions:  If you choose to wash your hair and private area, wash first with your normal shampoo/soap.  After you use shampoo/soap, rinse your hair and body thoroughly to remove shampoo/soap residue.  Turn the water OFF and apply half the bottle of CHG soap to a CLEAN washcloth.  Apply CHG soap ONLY FROM YOUR NECK DOWN TO YOUR TOES (washing for 3-5  minutes)  DO NOT use CHG soap on face, private areas, open wounds, or sores.  Pay special attention to the area where your surgery is being performed.  If you are having back surgery, having someone wash your back for you may be helpful. Wait 2 minutes after CHG soap is applied, then you may rinse off the CHG soap.  Pat dry with a clean towel  Put on clean clothes/pajamas    Additional instructions for the day of surgery: DO NOT APPLY any lotions, deodorants, cologne, or perfumes.   Do not wear jewelry or makeup Do not wear nail polish, gel polish, artificial nails, or any other type of covering on natural nails (fingers and toes) Do not bring valuables to the hospital. [facility] is not responsible for valuables/personal belongings. Put on clean/comfortable clothes.  Please brush your teeth.  Ask your nurse before applying any prescription medications to the skin.

## 2023-07-11 ENCOUNTER — Encounter (HOSPITAL_COMMUNITY)
Admission: RE | Admit: 2023-07-11 | Discharge: 2023-07-11 | Disposition: A | Discharge status: Home / Self Care | Payer: 59 | Source: Ambulatory Visit | Attending: Thoracic Surgery (Cardiothoracic Vascular Surgery) | Admitting: Thoracic Surgery (Cardiothoracic Vascular Surgery)

## 2023-07-11 ENCOUNTER — Encounter (HOSPITAL_COMMUNITY): Payer: Self-pay

## 2023-07-11 ENCOUNTER — Other Ambulatory Visit: Payer: Self-pay

## 2023-07-11 ENCOUNTER — Ambulatory Visit (HOSPITAL_COMMUNITY)
Admission: RE | Admit: 2023-07-11 | Discharge: 2023-07-11 | Disposition: A | Discharge status: Home / Self Care | Payer: 59 | Source: Ambulatory Visit | Attending: Thoracic Surgery (Cardiothoracic Vascular Surgery) | Admitting: Thoracic Surgery (Cardiothoracic Vascular Surgery)

## 2023-07-11 ENCOUNTER — Ambulatory Visit (HOSPITAL_BASED_OUTPATIENT_CLINIC_OR_DEPARTMENT_OTHER)
Admission: RE | Admit: 2023-07-11 | Discharge: 2023-07-11 | Disposition: A | Discharge status: Home / Self Care | Payer: 59 | Source: Ambulatory Visit | Attending: Thoracic Surgery (Cardiothoracic Vascular Surgery) | Admitting: Thoracic Surgery (Cardiothoracic Vascular Surgery)

## 2023-07-11 DIAGNOSIS — Z01818 Encounter for other preprocedural examination: Secondary | ICD-10-CM | POA: Insufficient documentation

## 2023-07-11 DIAGNOSIS — Z1152 Encounter for screening for COVID-19: Secondary | ICD-10-CM | POA: Insufficient documentation

## 2023-07-11 DIAGNOSIS — I25119 Atherosclerotic heart disease of native coronary artery with unspecified angina pectoris: Secondary | ICD-10-CM

## 2023-07-11 DIAGNOSIS — I2510 Atherosclerotic heart disease of native coronary artery without angina pectoris: Secondary | ICD-10-CM | POA: Insufficient documentation

## 2023-07-11 DIAGNOSIS — I10 Essential (primary) hypertension: Secondary | ICD-10-CM | POA: Insufficient documentation

## 2023-07-11 DIAGNOSIS — Z951 Presence of aortocoronary bypass graft: Secondary | ICD-10-CM | POA: Insufficient documentation

## 2023-07-11 DIAGNOSIS — D151 Benign neoplasm of heart: Secondary | ICD-10-CM | POA: Insufficient documentation

## 2023-07-11 HISTORY — DX: Angina pectoris, unspecified: I20.9

## 2023-07-11 HISTORY — DX: Personal history of urinary calculi: Z87.442

## 2023-07-11 HISTORY — DX: Atherosclerotic heart disease of native coronary artery without angina pectoris: I25.10

## 2023-07-11 LAB — VAS US DOPPLER PRE CABG
Left ABI: 1.14
Right ABI: 1.16

## 2023-07-11 LAB — TYPE AND SCREEN
ABO/RH(D): A NEG
Antibody Screen: NEGATIVE

## 2023-07-11 LAB — APTT: aPTT: 29 seconds (ref 24–36)

## 2023-07-11 LAB — HEMOGLOBIN A1C
Hgb A1c MFr Bld: 5.9 % — ABNORMAL HIGH (ref 4.8–5.6)
Mean Plasma Glucose: 122.63 mg/dL

## 2023-07-11 LAB — CBC
HCT: 46 % (ref 39.0–52.0)
Hemoglobin: 14.9 g/dL (ref 13.0–17.0)
MCH: 29.2 pg (ref 26.0–34.0)
MCHC: 32.4 g/dL (ref 30.0–36.0)
MCV: 90 fL (ref 80.0–100.0)
Platelets: 267 10*3/uL (ref 150–400)
RBC: 5.11 MIL/uL (ref 4.22–5.81)
RDW: 13.4 % (ref 11.5–15.5)
WBC: 7.7 10*3/uL (ref 4.0–10.5)
nRBC: 0 % (ref 0.0–0.2)

## 2023-07-11 LAB — URINALYSIS, ROUTINE W REFLEX MICROSCOPIC
Bilirubin Urine: NEGATIVE
Glucose, UA: NEGATIVE mg/dL
Hgb urine dipstick: NEGATIVE
Ketones, ur: NEGATIVE mg/dL
Leukocytes,Ua: NEGATIVE
Nitrite: NEGATIVE
Protein, ur: NEGATIVE mg/dL
Specific Gravity, Urine: 1.021 (ref 1.005–1.030)
pH: 5 (ref 5.0–8.0)

## 2023-07-11 LAB — COMPREHENSIVE METABOLIC PANEL
ALT: 37 U/L (ref 0–44)
AST: 23 U/L (ref 15–41)
Albumin: 4 g/dL (ref 3.5–5.0)
Alkaline Phosphatase: 70 U/L (ref 38–126)
Anion gap: 10 (ref 5–15)
BUN: 19 mg/dL (ref 8–23)
CO2: 21 mmol/L — ABNORMAL LOW (ref 22–32)
Calcium: 9 mg/dL (ref 8.9–10.3)
Chloride: 106 mmol/L (ref 98–111)
Creatinine, Ser: 0.95 mg/dL (ref 0.61–1.24)
GFR, Estimated: >60 mL/min (ref >60)
Glucose, Bld: 79 mg/dL (ref 70–99)
Potassium: 4.1 mmol/L (ref 3.5–5.1)
Sodium: 137 mmol/L (ref 135–145)
Total Bilirubin: 0.5 mg/dL (ref 0.3–1.2)
Total Protein: 7.1 g/dL (ref 6.5–8.1)

## 2023-07-11 LAB — BLOOD GAS, ARTERIAL
Acid-Base Excess: 0.3 mmol/L (ref 0.0–2.0)
Bicarbonate: 24.6 mmol/L (ref 20.0–28.0)
Drawn by: 58793
O2 Saturation: 99 %
Patient temperature: 37
pCO2 arterial: 38 mmHg (ref 32–48)
pH, Arterial: 7.42 (ref 7.35–7.45)
pO2, Arterial: 113 mmHg — ABNORMAL HIGH (ref 83–108)

## 2023-07-11 LAB — SURGICAL PCR SCREEN
MRSA, PCR: NEGATIVE
Staphylococcus aureus: NEGATIVE

## 2023-07-11 LAB — PROTIME-INR
INR: 1 (ref 0.8–1.2)
Prothrombin Time: 12.9 seconds (ref 11.4–15.2)

## 2023-07-11 NOTE — Progress Notes (Signed)
PCP - Dr. Dale Amarillo Cardiologist - Dr. Epifanio Lesches (Last Office Visit 06/20/2023 with 3 month follow-up)  PPM/ICD - Denies Device Orders - n/a Rep Notified - n/a  Chest x-ray - 07/11/2023 EKG - 06/20/2023 Stress Test - Denies ECHO - 06/08/2023 Cardiac Cath - 06/09/2023 CT Coronary - 06/03/2023  Sleep Study - Denies CPAP - n/a  No DM  Last dose of GLP1 agonist- n/a GLP1 instructions:  n/a  Blood Thinner Instructions: n/a Aspirin Instructions: n/a  NPO after midnight   COVID TEST- Yes. Result pending.    Anesthesia review: No  Patient denies shortness of breath, fever, cough and chest pain at PAT appointment. Pt denies any respiratory illness/infection in the last two months.   All instructions explained to the patient, with a verbal understanding of the material. Patient agrees to go over the instructions while at home for a better understanding. Patient also instructed to self quarantine after being tested for COVID-19. The opportunity to ask questions was provided.

## 2023-07-11 NOTE — Telephone Encounter (Signed)
We do not typically prescribe medications for anxiety, would recommend discussing with PCP

## 2023-07-11 NOTE — Progress Notes (Signed)
Pre-CABG testing has been completed. Preliminary results can be found in CV Proc through chart review.   07/11/23 2:04 PM Olen Cordial RVT

## 2023-07-12 LAB — SARS CORONAVIRUS 2 (TAT 6-24 HRS): SARS Coronavirus 2: NEGATIVE

## 2023-07-12 MED ORDER — NOREPINEPHRINE 4 MG/250ML-% IV SOLN
0.0000 ug/min | INTRAVENOUS | Status: DC
  Filled 2023-07-12: qty 250

## 2023-07-12 MED ORDER — TRANEXAMIC ACID (OHS) PUMP PRIME SOLUTION
2.0000 mg/kg | INTRAVENOUS | Status: DC
  Filled 2023-07-12: qty 1.53

## 2023-07-12 MED ORDER — MANNITOL 20 % IV SOLN
INTRAVENOUS | Status: DC
  Filled 2023-07-12: qty 13

## 2023-07-12 MED ORDER — HEPARIN 30,000 UNITS/1000 ML (OHS) CELLSAVER SOLUTION
Status: DC
  Filled 2023-07-12: qty 1000

## 2023-07-12 MED ORDER — TRANEXAMIC ACID 1000 MG/10ML IV SOLN
1.5000 mg/kg/h | INTRAVENOUS | Status: DC
  Filled 2023-07-12: qty 25

## 2023-07-12 MED ORDER — EPINEPHRINE HCL 5 MG/250ML IV SOLN IN NS
0.0000 ug/min | INTRAVENOUS | Status: DC
  Filled 2023-07-12: qty 250

## 2023-07-12 MED ORDER — DEXMEDETOMIDINE HCL IN NACL 400 MCG/100ML IV SOLN
0.1000 ug/kg/h | INTRAVENOUS | Status: AC
  Administered 2023-07-13: .4 ug/kg/h via INTRAVENOUS
  Filled 2023-07-12: qty 100

## 2023-07-12 MED ORDER — CEFAZOLIN SODIUM-DEXTROSE 2-4 GM/100ML-% IV SOLN
2.0000 g | INTRAVENOUS | Status: DC
  Filled 2023-07-12: qty 100

## 2023-07-12 MED ORDER — CEFAZOLIN SODIUM-DEXTROSE 2-4 GM/100ML-% IV SOLN
2.0000 g | INTRAVENOUS | Status: AC
  Administered 2023-07-13 (×2): 2 g via INTRAVENOUS
  Filled 2023-07-12: qty 100

## 2023-07-12 MED ORDER — NITROGLYCERIN IN D5W 200-5 MCG/ML-% IV SOLN
2.0000 ug/min | INTRAVENOUS | Status: AC
  Administered 2023-07-13: 10 ug/min via INTRAVENOUS
  Filled 2023-07-12: qty 250

## 2023-07-12 MED ORDER — PLASMA-LYTE A IV SOLN
INTRAVENOUS | Status: DC
  Filled 2023-07-12: qty 2.5

## 2023-07-12 MED ORDER — MILRINONE LACTATE IN DEXTROSE 20-5 MG/100ML-% IV SOLN
0.3000 ug/kg/min | INTRAVENOUS | Status: DC
  Filled 2023-07-12: qty 100

## 2023-07-12 MED ORDER — VANCOMYCIN HCL 1250 MG/250ML IV SOLN
1250.0000 mg | INTRAVENOUS | Status: AC
  Administered 2023-07-13: 1250 mg via INTRAVENOUS
  Filled 2023-07-12: qty 250

## 2023-07-12 MED ORDER — PHENYLEPHRINE HCL-NACL 20-0.9 MG/250ML-% IV SOLN
30.0000 ug/min | INTRAVENOUS | Status: AC
  Administered 2023-07-13: 10 ug/min via INTRAVENOUS
  Filled 2023-07-12: qty 250

## 2023-07-12 MED ORDER — POTASSIUM CHLORIDE 2 MEQ/ML IV SOLN
80.0000 meq | INTRAVENOUS | Status: DC
  Filled 2023-07-12: qty 40

## 2023-07-12 MED ORDER — TRANEXAMIC ACID (OHS) BOLUS VIA INFUSION
15.0000 mg/kg | INTRAVENOUS | Status: AC
  Administered 2023-07-13: 1000 mg via INTRAVENOUS
  Filled 2023-07-12: qty 1151

## 2023-07-12 MED ORDER — INSULIN REGULAR(HUMAN) IN NACL 100-0.9 UT/100ML-% IV SOLN
INTRAVENOUS | Status: AC
  Administered 2023-07-13: 1 [IU]/h via INTRAVENOUS
  Filled 2023-07-12: qty 100

## 2023-07-12 NOTE — Telephone Encounter (Signed)
Left message for patient to contact medical doctor.

## 2023-07-13 ENCOUNTER — Inpatient Hospital Stay (HOSPITAL_COMMUNITY)
Admission: RE | Admit: 2023-07-13 | Discharge: 2023-07-18 | DRG: 236 | Disposition: A | Discharge status: Home / Self Care | Payer: 59 | Attending: Thoracic Surgery (Cardiothoracic Vascular Surgery) | Admitting: Thoracic Surgery (Cardiothoracic Vascular Surgery)

## 2023-07-13 ENCOUNTER — Inpatient Hospital Stay (HOSPITAL_COMMUNITY)
Admission: RE | Disposition: A | Discharge status: Home / Self Care | Payer: Self-pay | Source: Home / Self Care | Attending: Thoracic Surgery (Cardiothoracic Vascular Surgery)

## 2023-07-13 ENCOUNTER — Inpatient Hospital Stay (HOSPITAL_COMMUNITY): Payer: 59 | Admitting: Anesthesiology

## 2023-07-13 ENCOUNTER — Encounter (HOSPITAL_COMMUNITY): Payer: Self-pay | Admitting: Thoracic Surgery (Cardiothoracic Vascular Surgery)

## 2023-07-13 ENCOUNTER — Other Ambulatory Visit: Payer: Self-pay

## 2023-07-13 ENCOUNTER — Inpatient Hospital Stay (HOSPITAL_COMMUNITY): Payer: 59

## 2023-07-13 DIAGNOSIS — L728 Other follicular cysts of the skin and subcutaneous tissue: Secondary | ICD-10-CM | POA: Diagnosis not present

## 2023-07-13 DIAGNOSIS — E871 Hypo-osmolality and hyponatremia: Secondary | ICD-10-CM | POA: Diagnosis not present

## 2023-07-13 DIAGNOSIS — Z833 Family history of diabetes mellitus: Secondary | ICD-10-CM | POA: Diagnosis not present

## 2023-07-13 DIAGNOSIS — E876 Hypokalemia: Secondary | ICD-10-CM | POA: Diagnosis not present

## 2023-07-13 DIAGNOSIS — Z1152 Encounter for screening for COVID-19: Secondary | ICD-10-CM | POA: Diagnosis not present

## 2023-07-13 DIAGNOSIS — R7303 Prediabetes: Secondary | ICD-10-CM | POA: Diagnosis not present

## 2023-07-13 DIAGNOSIS — Z8249 Family history of ischemic heart disease and other diseases of the circulatory system: Secondary | ICD-10-CM

## 2023-07-13 DIAGNOSIS — D151 Benign neoplasm of heart: Secondary | ICD-10-CM | POA: Diagnosis present

## 2023-07-13 DIAGNOSIS — Z79899 Other long term (current) drug therapy: Secondary | ICD-10-CM

## 2023-07-13 DIAGNOSIS — I25119 Atherosclerotic heart disease of native coronary artery with unspecified angina pectoris: Secondary | ICD-10-CM

## 2023-07-13 DIAGNOSIS — I2510 Atherosclerotic heart disease of native coronary artery without angina pectoris: Secondary | ICD-10-CM | POA: Diagnosis not present

## 2023-07-13 DIAGNOSIS — I10 Essential (primary) hypertension: Secondary | ICD-10-CM

## 2023-07-13 DIAGNOSIS — Z951 Presence of aortocoronary bypass graft: Principal | ICD-10-CM

## 2023-07-13 DIAGNOSIS — Z800 Family history of malignant neoplasm of digestive organs: Secondary | ICD-10-CM | POA: Diagnosis not present

## 2023-07-13 DIAGNOSIS — Z7982 Long term (current) use of aspirin: Secondary | ICD-10-CM | POA: Diagnosis not present

## 2023-07-13 DIAGNOSIS — D62 Acute posthemorrhagic anemia: Secondary | ICD-10-CM | POA: Diagnosis not present

## 2023-07-13 DIAGNOSIS — E7800 Pure hypercholesterolemia, unspecified: Secondary | ICD-10-CM

## 2023-07-13 DIAGNOSIS — Z87891 Personal history of nicotine dependence: Secondary | ICD-10-CM

## 2023-07-13 DIAGNOSIS — E8770 Fluid overload, unspecified: Secondary | ICD-10-CM | POA: Diagnosis not present

## 2023-07-13 DIAGNOSIS — K219 Gastro-esophageal reflux disease without esophagitis: Secondary | ICD-10-CM | POA: Diagnosis present

## 2023-07-13 DIAGNOSIS — Z888 Allergy status to other drugs, medicaments and biological substances status: Secondary | ICD-10-CM | POA: Diagnosis not present

## 2023-07-13 DIAGNOSIS — Z87442 Personal history of urinary calculi: Secondary | ICD-10-CM | POA: Diagnosis not present

## 2023-07-13 HISTORY — PX: CYST EXCISION: SHX5701

## 2023-07-13 HISTORY — PX: EXCISION OF ATRIAL MYXOMA: SHX5821

## 2023-07-13 HISTORY — PX: CORONARY ARTERY BYPASS GRAFT: SHX141

## 2023-07-13 HISTORY — PX: TEE WITHOUT CARDIOVERSION: SHX5443

## 2023-07-13 LAB — POCT I-STAT 7, (LYTES, BLD GAS, ICA,H+H)
Acid-base deficit: 1 mmol/L (ref 0.0–2.0)
Acid-base deficit: 2 mmol/L (ref 0.0–2.0)
Acid-base deficit: 2 mmol/L (ref 0.0–2.0)
Acid-base deficit: 2 mmol/L (ref 0.0–2.0)
Acid-base deficit: 4 mmol/L — ABNORMAL HIGH (ref 0.0–2.0)
Bicarbonate: 25 mmol/L (ref 20.0–28.0)
Bicarbonate: 22 mmol/L (ref 20.0–28.0)
Bicarbonate: 24.2 mmol/L (ref 20.0–28.0)
Bicarbonate: 23.2 mmol/L (ref 20.0–28.0)
Bicarbonate: 21.1 mmol/L (ref 20.0–28.0)
Calcium, Ion: 1.18 mmol/L (ref 1.15–1.40)
Calcium, Ion: 0.86 mmol/L — CL (ref 1.15–1.40)
Calcium, Ion: 1.2 mmol/L (ref 1.15–1.40)
Calcium, Ion: 1.15 mmol/L (ref 1.15–1.40)
Calcium, Ion: 1 mmol/L — ABNORMAL LOW (ref 1.15–1.40)
HCT: 38 % — ABNORMAL LOW (ref 39.0–52.0)
HCT: 23 % — ABNORMAL LOW (ref 39.0–52.0)
HCT: 25 % — ABNORMAL LOW (ref 39.0–52.0)
HCT: 31 % — ABNORMAL LOW (ref 39.0–52.0)
HCT: 30 % — ABNORMAL LOW (ref 39.0–52.0)
Hemoglobin: 12.9 g/dL — ABNORMAL LOW (ref 13.0–17.0)
Hemoglobin: 7.8 g/dL — ABNORMAL LOW (ref 13.0–17.0)
Hemoglobin: 8.5 g/dL — ABNORMAL LOW (ref 13.0–17.0)
Hemoglobin: 10.5 g/dL — ABNORMAL LOW (ref 13.0–17.0)
Hemoglobin: 10.2 g/dL — ABNORMAL LOW (ref 13.0–17.0)
O2 Saturation: 100 %
O2 Saturation: 100 %
O2 Saturation: 99 %
O2 Saturation: 99 %
O2 Saturation: 99 %
Patient temperature: 35.9
Patient temperature: 36.5
Potassium: 4.2 mmol/L (ref 3.5–5.1)
Potassium: 4.7 mmol/L (ref 3.5–5.1)
Potassium: 4.1 mmol/L (ref 3.5–5.1)
Potassium: 4.4 mmol/L (ref 3.5–5.1)
Potassium: 5 mmol/L (ref 3.5–5.1)
Sodium: 138 mmol/L (ref 135–145)
Sodium: 137 mmol/L (ref 135–145)
Sodium: 139 mmol/L (ref 135–145)
Sodium: 140 mmol/L (ref 135–145)
Sodium: 139 mmol/L (ref 135–145)
TCO2: 26 mmol/L (ref 22–32)
TCO2: 23 mmol/L (ref 22–32)
TCO2: 26 mmol/L (ref 22–32)
TCO2: 25 mmol/L (ref 22–32)
TCO2: 22 mmol/L (ref 22–32)
pCO2 arterial: 44 mmHg (ref 32–48)
pCO2 arterial: 32.1 mmHg (ref 32–48)
pCO2 arterial: 45.1 mmHg (ref 32–48)
pCO2 arterial: 40.9 mmHg (ref 32–48)
pCO2 arterial: 37.6 mmHg (ref 32–48)
pH, Arterial: 7.362 (ref 7.35–7.45)
pH, Arterial: 7.444 (ref 7.35–7.45)
pH, Arterial: 7.338 — ABNORMAL LOW (ref 7.35–7.45)
pH, Arterial: 7.358 (ref 7.35–7.45)
pH, Arterial: 7.355 (ref 7.35–7.45)
pO2, Arterial: 550 mmHg — ABNORMAL HIGH (ref 83–108)
pO2, Arterial: 387 mmHg — ABNORMAL HIGH (ref 83–108)
pO2, Arterial: 132 mmHg — ABNORMAL HIGH (ref 83–108)
pO2, Arterial: 122 mmHg — ABNORMAL HIGH (ref 83–108)
pO2, Arterial: 148 mmHg — ABNORMAL HIGH (ref 83–108)

## 2023-07-13 LAB — CBC
HCT: 32.3 % — ABNORMAL LOW (ref 39.0–52.0)
HCT: 31.8 % — ABNORMAL LOW (ref 39.0–52.0)
Hemoglobin: 10.6 g/dL — ABNORMAL LOW (ref 13.0–17.0)
Hemoglobin: 10.5 g/dL — ABNORMAL LOW (ref 13.0–17.0)
MCH: 29.5 pg (ref 26.0–34.0)
MCH: 30 pg (ref 26.0–34.0)
MCHC: 32.8 g/dL (ref 30.0–36.0)
MCHC: 33 g/dL (ref 30.0–36.0)
MCV: 90 fL (ref 80.0–100.0)
MCV: 90.9 fL (ref 80.0–100.0)
Platelets: 116 10*3/uL — ABNORMAL LOW (ref 150–400)
Platelets: 139 10*3/uL — ABNORMAL LOW (ref 150–400)
RBC: 3.59 MIL/uL — ABNORMAL LOW (ref 4.22–5.81)
RBC: 3.5 MIL/uL — ABNORMAL LOW (ref 4.22–5.81)
RDW: 13 % (ref 11.5–15.5)
RDW: 13.2 % (ref 11.5–15.5)
WBC: 11.1 10*3/uL — ABNORMAL HIGH (ref 4.0–10.5)
WBC: 11.1 10*3/uL — ABNORMAL HIGH (ref 4.0–10.5)
nRBC: 0 % (ref 0.0–0.2)
nRBC: 0 % (ref 0.0–0.2)

## 2023-07-13 LAB — POCT I-STAT, CHEM 8
BUN: 19 mg/dL (ref 8–23)
BUN: 17 mg/dL (ref 8–23)
BUN: 14 mg/dL (ref 8–23)
BUN: 16 mg/dL (ref 8–23)
BUN: 18 mg/dL (ref 8–23)
BUN: 18 mg/dL (ref 8–23)
Calcium, Ion: 1.23 mmol/L (ref 1.15–1.40)
Calcium, Ion: 1.19 mmol/L (ref 1.15–1.40)
Calcium, Ion: 0.87 mmol/L — CL (ref 1.15–1.40)
Calcium, Ion: 0.99 mmol/L — ABNORMAL LOW (ref 1.15–1.40)
Calcium, Ion: 0.97 mmol/L — ABNORMAL LOW (ref 1.15–1.40)
Calcium, Ion: 1.21 mmol/L (ref 1.15–1.40)
Chloride: 106 mmol/L (ref 98–111)
Chloride: 105 mmol/L (ref 98–111)
Chloride: 102 mmol/L (ref 98–111)
Chloride: 104 mmol/L (ref 98–111)
Chloride: 103 mmol/L (ref 98–111)
Chloride: 105 mmol/L (ref 98–111)
Creatinine, Ser: 0.7 mg/dL (ref 0.61–1.24)
Creatinine, Ser: 0.7 mg/dL (ref 0.61–1.24)
Creatinine, Ser: 0.5 mg/dL — ABNORMAL LOW (ref 0.61–1.24)
Creatinine, Ser: 0.7 mg/dL (ref 0.61–1.24)
Creatinine, Ser: 0.7 mg/dL (ref 0.61–1.24)
Creatinine, Ser: 0.7 mg/dL (ref 0.61–1.24)
Glucose, Bld: 102 mg/dL — ABNORMAL HIGH (ref 70–99)
Glucose, Bld: 131 mg/dL — ABNORMAL HIGH (ref 70–99)
Glucose, Bld: 114 mg/dL — ABNORMAL HIGH (ref 70–99)
Glucose, Bld: 156 mg/dL — ABNORMAL HIGH (ref 70–99)
Glucose, Bld: 157 mg/dL — ABNORMAL HIGH (ref 70–99)
Glucose, Bld: 147 mg/dL — ABNORMAL HIGH (ref 70–99)
HCT: 38 % — ABNORMAL LOW (ref 39.0–52.0)
HCT: 34 % — ABNORMAL LOW (ref 39.0–52.0)
HCT: 23 % — ABNORMAL LOW (ref 39.0–52.0)
HCT: 24 % — ABNORMAL LOW (ref 39.0–52.0)
HCT: 25 % — ABNORMAL LOW (ref 39.0–52.0)
HCT: 23 % — ABNORMAL LOW (ref 39.0–52.0)
Hemoglobin: 12.9 g/dL — ABNORMAL LOW (ref 13.0–17.0)
Hemoglobin: 11.6 g/dL — ABNORMAL LOW (ref 13.0–17.0)
Hemoglobin: 7.8 g/dL — ABNORMAL LOW (ref 13.0–17.0)
Hemoglobin: 8.2 g/dL — ABNORMAL LOW (ref 13.0–17.0)
Hemoglobin: 8.5 g/dL — ABNORMAL LOW (ref 13.0–17.0)
Hemoglobin: 7.8 g/dL — ABNORMAL LOW (ref 13.0–17.0)
Potassium: 4.2 mmol/L (ref 3.5–5.1)
Potassium: 4.4 mmol/L (ref 3.5–5.1)
Potassium: 4.7 mmol/L (ref 3.5–5.1)
Potassium: 5.2 mmol/L — ABNORMAL HIGH (ref 3.5–5.1)
Potassium: 5.2 mmol/L — ABNORMAL HIGH (ref 3.5–5.1)
Potassium: 4.1 mmol/L (ref 3.5–5.1)
Sodium: 138 mmol/L (ref 135–145)
Sodium: 138 mmol/L (ref 135–145)
Sodium: 137 mmol/L (ref 135–145)
Sodium: 135 mmol/L (ref 135–145)
Sodium: 136 mmol/L (ref 135–145)
Sodium: 138 mmol/L (ref 135–145)
TCO2: 27 mmol/L (ref 22–32)
TCO2: 24 mmol/L (ref 22–32)
TCO2: 24 mmol/L (ref 22–32)
TCO2: 27 mmol/L (ref 22–32)
TCO2: 26 mmol/L (ref 22–32)
TCO2: 24 mmol/L (ref 22–32)

## 2023-07-13 LAB — COMPREHENSIVE METABOLIC PANEL
ALT: 26 U/L (ref 0–44)
AST: 69 U/L — ABNORMAL HIGH (ref 15–41)
Albumin: 3.8 g/dL (ref 3.5–5.0)
Alkaline Phosphatase: 40 U/L (ref 38–126)
Anion gap: 10 (ref 5–15)
BUN: 15 mg/dL (ref 8–23)
CO2: 20 mmol/L — ABNORMAL LOW (ref 22–32)
Calcium: 7.8 mg/dL — ABNORMAL LOW (ref 8.9–10.3)
Chloride: 106 mmol/L (ref 98–111)
Creatinine, Ser: 1.05 mg/dL (ref 0.61–1.24)
GFR, Estimated: >60 mL/min (ref >60)
Glucose, Bld: 150 mg/dL — ABNORMAL HIGH (ref 70–99)
Potassium: 4.3 mmol/L (ref 3.5–5.1)
Sodium: 136 mmol/L (ref 135–145)
Total Bilirubin: 1.5 mg/dL — ABNORMAL HIGH (ref 0.3–1.2)
Total Protein: 5.2 g/dL — ABNORMAL LOW (ref 6.5–8.1)

## 2023-07-13 LAB — APTT: aPTT: 31 seconds (ref 24–36)

## 2023-07-13 LAB — ECHO INTRAOPERATIVE TEE
Height: 66 in
Weight: 2672 oz

## 2023-07-13 LAB — GLUCOSE, CAPILLARY
Glucose-Capillary: 121 mg/dL — ABNORMAL HIGH (ref 70–99)
Glucose-Capillary: 114 mg/dL — ABNORMAL HIGH (ref 70–99)
Glucose-Capillary: 109 mg/dL — ABNORMAL HIGH (ref 70–99)
Glucose-Capillary: 119 mg/dL — ABNORMAL HIGH (ref 70–99)
Glucose-Capillary: 150 mg/dL — ABNORMAL HIGH (ref 70–99)

## 2023-07-13 LAB — ABO/RH: ABO/RH(D): A NEG

## 2023-07-13 LAB — HEMOGLOBIN AND HEMATOCRIT, BLOOD
HCT: 24.5 % — ABNORMAL LOW (ref 39.0–52.0)
Hemoglobin: 8.3 g/dL — ABNORMAL LOW (ref 13.0–17.0)

## 2023-07-13 LAB — MAGNESIUM: Magnesium: 3 mg/dL — ABNORMAL HIGH (ref 1.7–2.4)

## 2023-07-13 LAB — PROTIME-INR
INR: 1.4 — ABNORMAL HIGH (ref 0.8–1.2)
Prothrombin Time: 16.9 seconds — ABNORMAL HIGH (ref 11.4–15.2)

## 2023-07-13 LAB — PLATELET COUNT: Platelets: 156 10*3/uL (ref 150–400)

## 2023-07-13 SURGERY — CORONARY ARTERY BYPASS GRAFTING (CABG)
Anesthesia: General | Site: Chest

## 2023-07-13 MED ORDER — MIDAZOLAM HCL (PF) 10 MG/2ML IJ SOLN
INTRAMUSCULAR | Status: AC
  Filled 2023-07-13: qty 2

## 2023-07-13 MED ORDER — DEXTROSE 50 % IV SOLN: 0.0000 mL | INTRAVENOUS | Status: DC | PRN

## 2023-07-13 MED ORDER — MIDAZOLAM HCL (PF) 5 MG/ML IJ SOLN
INTRAMUSCULAR | Status: DC | PRN
  Administered 2023-07-13 (×5): 2 mg via INTRAVENOUS

## 2023-07-13 MED ORDER — FENTANYL CITRATE (PF) 250 MCG/5ML IJ SOLN
INTRAMUSCULAR | Status: AC
  Filled 2023-07-13: qty 5

## 2023-07-13 MED ORDER — NICARDIPINE HCL IN NACL 20-0.86 MG/200ML-% IV SOLN
0.0000 mg/h | INTRAVENOUS | Status: DC
  Administered 2023-07-13: 5 mg/h via INTRAVENOUS
  Filled 2023-07-13: qty 200

## 2023-07-13 MED ORDER — SODIUM CHLORIDE 0.9 % IV SOLN: 250.0000 mL | INTRAVENOUS | Status: DC

## 2023-07-13 MED ORDER — CHLORHEXIDINE GLUCONATE CLOTH 2 % EX PADS
6.0000 | MEDICATED_PAD | Freq: Every day | CUTANEOUS | Status: DC
  Administered 2023-07-14 – 2023-07-18 (×5): 6 via TOPICAL

## 2023-07-13 MED ORDER — VANCOMYCIN HCL IN DEXTROSE 1-5 GM/200ML-% IV SOLN
1000.0000 mg | Freq: Once | INTRAVENOUS | Status: AC
  Administered 2023-07-13: 1000 mg via INTRAVENOUS
  Filled 2023-07-13: qty 200

## 2023-07-13 MED ORDER — POTASSIUM CHLORIDE 10 MEQ/50ML IV SOLN: 10.0000 meq | INTRAVENOUS | Status: AC

## 2023-07-13 MED ORDER — ONDANSETRON HCL 4 MG/2ML IJ SOLN
4.0000 mg | Freq: Four times a day (QID) | INTRAMUSCULAR | Status: DC | PRN
  Administered 2023-07-14 – 2023-07-16 (×7): 4 mg via INTRAVENOUS
  Filled 2023-07-13 (×7): qty 2

## 2023-07-13 MED ORDER — ASPIRIN 81 MG PO CHEW: 324.0000 mg | CHEWABLE_TABLET | Freq: Every day | ORAL | Status: DC

## 2023-07-13 MED ORDER — PROTAMINE SULFATE 10 MG/ML IV SOLN
INTRAVENOUS | Status: DC | PRN
  Administered 2023-07-13: 20 mg via INTRAVENOUS
  Administered 2023-07-13: 30 mg via INTRAVENOUS
  Administered 2023-07-13 (×3): 20 mg via INTRAVENOUS
  Administered 2023-07-13: 10 mg via INTRAVENOUS
  Administered 2023-07-13: 20 mg via INTRAVENOUS
  Administered 2023-07-13: 30 mg via INTRAVENOUS
  Administered 2023-07-13: 20 mg via INTRAVENOUS
  Administered 2023-07-13: 30 mg via INTRAVENOUS
  Administered 2023-07-13: 20 mg via INTRAVENOUS
  Administered 2023-07-13: 30 mg via INTRAVENOUS

## 2023-07-13 MED ORDER — PROPOFOL 10 MG/ML IV BOLUS
INTRAVENOUS | Status: AC
  Filled 2023-07-13: qty 20

## 2023-07-13 MED ORDER — ATORVASTATIN CALCIUM 80 MG PO TABS
80.0000 mg | ORAL_TABLET | Freq: Every day | ORAL | Status: DC
  Administered 2023-07-14 – 2023-07-17 (×4): 80 mg via ORAL
  Filled 2023-07-13 (×5): qty 1

## 2023-07-13 MED ORDER — METOPROLOL TARTRATE 12.5 MG HALF TABLET: 12.5000 mg | ORAL_TABLET | Freq: Once | ORAL | Status: DC

## 2023-07-13 MED ORDER — ALBUMIN HUMAN 5 % IV SOLN: INTRAVENOUS | Status: DC | PRN

## 2023-07-13 MED ORDER — ASPIRIN 81 MG PO CHEW: 324.0000 mg | CHEWABLE_TABLET | Freq: Once | ORAL | Status: DC

## 2023-07-13 MED ORDER — METOPROLOL TARTRATE 12.5 MG HALF TABLET
12.5000 mg | ORAL_TABLET | Freq: Two times a day (BID) | ORAL | Status: DC
  Administered 2023-07-14 – 2023-07-15 (×4): 12.5 mg via ORAL
  Filled 2023-07-13 (×4): qty 1

## 2023-07-13 MED ORDER — LACTATED RINGERS IV SOLN: INTRAVENOUS | Status: DC

## 2023-07-13 MED ORDER — PANTOPRAZOLE SODIUM 40 MG IV SOLR
40.0000 mg | Freq: Every day | INTRAVENOUS | Status: AC
  Administered 2023-07-13 – 2023-07-14 (×2): 40 mg via INTRAVENOUS
  Filled 2023-07-13 (×2): qty 10

## 2023-07-13 MED ORDER — SODIUM CHLORIDE 0.9 % IV SOLN: INTRAVENOUS | Status: DC

## 2023-07-13 MED ORDER — LIDOCAINE 2% (20 MG/ML) 5 ML SYRINGE
INTRAMUSCULAR | Status: AC
  Filled 2023-07-13: qty 10

## 2023-07-13 MED ORDER — ~~LOC~~ CARDIAC SURGERY, PATIENT & FAMILY EDUCATION
Freq: Once | Status: DC
  Filled 2023-07-13: qty 1

## 2023-07-13 MED ORDER — HEPARIN SODIUM (PORCINE) 1000 UNIT/ML IJ SOLN
INTRAMUSCULAR | Status: AC
  Filled 2023-07-13: qty 1

## 2023-07-13 MED ORDER — PANTOPRAZOLE SODIUM 40 MG PO TBEC
40.0000 mg | DELAYED_RELEASE_TABLET | Freq: Every day | ORAL | Status: DC
  Administered 2023-07-15 – 2023-07-18 (×4): 40 mg via ORAL
  Filled 2023-07-13 (×4): qty 1

## 2023-07-13 MED ORDER — ROCURONIUM BROMIDE 10 MG/ML (PF) SYRINGE
PREFILLED_SYRINGE | INTRAVENOUS | Status: DC | PRN
  Administered 2023-07-13 (×3): 50 mg via INTRAVENOUS
  Administered 2023-07-13: 10 mg via INTRAVENOUS
  Administered 2023-07-13: 90 mg via INTRAVENOUS

## 2023-07-13 MED ORDER — ALBUMIN HUMAN 5 % IV SOLN
250.0000 mL | INTRAVENOUS | Status: DC | PRN
  Administered 2023-07-13 (×3): 12.5 g via INTRAVENOUS
  Filled 2023-07-13: qty 250

## 2023-07-13 MED ORDER — METOPROLOL TARTRATE 25 MG/10 ML ORAL SUSPENSION: 12.5000 mg | Freq: Two times a day (BID) | ORAL | Status: DC

## 2023-07-13 MED ORDER — ROCURONIUM BROMIDE 10 MG/ML (PF) SYRINGE
PREFILLED_SYRINGE | INTRAVENOUS | Status: AC
  Filled 2023-07-13: qty 20

## 2023-07-13 MED ORDER — CHLORHEXIDINE GLUCONATE 0.12 % MT SOLN
15.0000 mL | OROMUCOSAL | Status: AC
  Administered 2023-07-13: 15 mL via OROMUCOSAL
  Filled 2023-07-13: qty 15

## 2023-07-13 MED ORDER — PROPOFOL 10 MG/ML IV BOLUS
INTRAVENOUS | Status: DC | PRN
Start: 2023-07-13 — End: 2023-07-13
  Administered 2023-07-13: 130 mg via INTRAVENOUS

## 2023-07-13 MED ORDER — LACTATED RINGERS IV SOLN: INTRAVENOUS | Status: DC | PRN

## 2023-07-13 MED ORDER — LIDOCAINE HCL (CARDIAC) PF 100 MG/5ML IV SOSY
PREFILLED_SYRINGE | INTRAVENOUS | Status: DC | PRN
  Administered 2023-07-13: 60 mg via INTRATRACHEAL

## 2023-07-13 MED ORDER — MORPHINE SULFATE (PF) 2 MG/ML IV SOLN
1.0000 mg | INTRAVENOUS | Status: DC | PRN
  Administered 2023-07-13: 1 mg via INTRAVENOUS
  Administered 2023-07-14 (×3): 2 mg via INTRAVENOUS
  Filled 2023-07-13 (×4): qty 1

## 2023-07-13 MED ORDER — CHLORHEXIDINE GLUCONATE 0.12 % MT SOLN
15.0000 mL | Freq: Once | OROMUCOSAL | Status: AC
  Administered 2023-07-13: 15 mL via OROMUCOSAL
  Filled 2023-07-13: qty 15

## 2023-07-13 MED ORDER — BISACODYL 5 MG PO TBEC
10.0000 mg | DELAYED_RELEASE_TABLET | Freq: Every day | ORAL | Status: DC
  Administered 2023-07-14 – 2023-07-16 (×2): 10 mg via ORAL
  Filled 2023-07-13 (×4): qty 2

## 2023-07-13 MED ORDER — PROTAMINE SULFATE 10 MG/ML IV SOLN
INTRAVENOUS | Status: AC
  Filled 2023-07-13: qty 5

## 2023-07-13 MED ORDER — DOCUSATE SODIUM 100 MG PO CAPS
200.0000 mg | ORAL_CAPSULE | Freq: Every day | ORAL | Status: DC
  Administered 2023-07-14 – 2023-07-18 (×4): 200 mg via ORAL
  Filled 2023-07-13 (×5): qty 2

## 2023-07-13 MED ORDER — ACETAMINOPHEN 160 MG/5ML PO SOLN: 650.0000 mg | Freq: Once | ORAL | Status: DC

## 2023-07-13 MED ORDER — EPHEDRINE SULFATE-NACL 50-0.9 MG/10ML-% IV SOSY
PREFILLED_SYRINGE | INTRAVENOUS | Status: DC | PRN
  Administered 2023-07-13: 2.5 mg via INTRAVENOUS
  Administered 2023-07-13 (×2): 5 mg via INTRAVENOUS

## 2023-07-13 MED ORDER — PLASMA-LYTE A IV SOLN: INTRAVENOUS | Status: DC | PRN

## 2023-07-13 MED ORDER — SODIUM CHLORIDE 0.45 % IV SOLN: INTRAVENOUS | Status: DC | PRN

## 2023-07-13 MED ORDER — ASPIRIN 325 MG PO TBEC: 325.0000 mg | DELAYED_RELEASE_TABLET | Freq: Every day | ORAL | Status: DC

## 2023-07-13 MED ORDER — SODIUM CHLORIDE 0.9 % IV SOLN: INTRAVENOUS | Status: DC | PRN

## 2023-07-13 MED ORDER — CHLORHEXIDINE GLUCONATE 4 % EX SOLN: 30.0000 mL | CUTANEOUS | Status: DC

## 2023-07-13 MED ORDER — ACETAMINOPHEN 650 MG RE SUPP
650.0000 mg | Freq: Once | RECTAL | Status: AC
  Administered 2023-07-13: 650 mg via RECTAL

## 2023-07-13 MED ORDER — DIPHENHYDRAMINE HCL 50 MG/ML IJ SOLN
INTRAMUSCULAR | Status: DC | PRN
  Administered 2023-07-13: 25 mg via INTRAVENOUS

## 2023-07-13 MED ORDER — PROTAMINE SULFATE 10 MG/ML IV SOLN
INTRAVENOUS | Status: AC
  Filled 2023-07-13: qty 25

## 2023-07-13 MED ORDER — SODIUM CHLORIDE 0.9% FLUSH
3.0000 mL | Freq: Two times a day (BID) | INTRAVENOUS | Status: DC
  Administered 2023-07-14 – 2023-07-16 (×4): 3 mL via INTRAVENOUS

## 2023-07-13 MED ORDER — CALCIUM CHLORIDE 10 % IV SOLN
INTRAVENOUS | Status: AC
  Filled 2023-07-13: qty 10

## 2023-07-13 MED ORDER — FENTANYL CITRATE (PF) 250 MCG/5ML IJ SOLN
INTRAMUSCULAR | Status: DC | PRN
  Administered 2023-07-13: 150 ug via INTRAVENOUS
  Administered 2023-07-13 (×2): 50 ug via INTRAVENOUS
  Administered 2023-07-13: 100 ug via INTRAVENOUS
  Administered 2023-07-13: 50 ug via INTRAVENOUS
  Administered 2023-07-13: 150 ug via INTRAVENOUS
  Administered 2023-07-13 (×2): 50 ug via INTRAVENOUS

## 2023-07-13 MED ORDER — METOPROLOL TARTRATE 5 MG/5ML IV SOLN: 2.5000 mg | INTRAVENOUS | Status: DC | PRN

## 2023-07-13 MED ORDER — SODIUM CHLORIDE (PF) 0.9 % IJ SOLN: OROMUCOSAL | Status: DC | PRN

## 2023-07-13 MED ORDER — 0.9 % SODIUM CHLORIDE (POUR BTL) OPTIME
TOPICAL | Status: DC | PRN
  Administered 2023-07-13: 5000 mL

## 2023-07-13 MED ORDER — ACETAMINOPHEN 500 MG PO TABS
1000.0000 mg | ORAL_TABLET | Freq: Four times a day (QID) | ORAL | Status: DC
  Administered 2023-07-13 – 2023-07-18 (×15): 1000 mg via ORAL
  Filled 2023-07-13 (×16): qty 2

## 2023-07-13 MED ORDER — ORAL CARE MOUTH RINSE
15.0000 mL | OROMUCOSAL | Status: DC
  Administered 2023-07-13 (×3): 15 mL via OROMUCOSAL

## 2023-07-13 MED ORDER — MIDAZOLAM HCL 2 MG/2ML IJ SOLN: 2.0000 mg | INTRAMUSCULAR | Status: DC | PRN

## 2023-07-13 MED ORDER — CEFAZOLIN SODIUM-DEXTROSE 2-4 GM/100ML-% IV SOLN
2.0000 g | Freq: Three times a day (TID) | INTRAVENOUS | Status: AC
  Administered 2023-07-13 – 2023-07-15 (×6): 2 g via INTRAVENOUS
  Filled 2023-07-13 (×6): qty 100

## 2023-07-13 MED ORDER — TRAMADOL HCL 50 MG PO TABS
50.0000 mg | ORAL_TABLET | ORAL | Status: DC | PRN
  Administered 2023-07-14 – 2023-07-15 (×3): 100 mg via ORAL
  Administered 2023-07-16: 50 mg via ORAL
  Administered 2023-07-17 – 2023-07-18 (×2): 100 mg via ORAL
  Filled 2023-07-13: qty 2
  Filled 2023-07-13: qty 1
  Filled 2023-07-13 (×4): qty 2

## 2023-07-13 MED ORDER — METOCLOPRAMIDE HCL 5 MG/ML IJ SOLN
10.0000 mg | Freq: Four times a day (QID) | INTRAMUSCULAR | Status: AC
  Administered 2023-07-13 – 2023-07-14 (×6): 10 mg via INTRAVENOUS
  Filled 2023-07-13 (×6): qty 2

## 2023-07-13 MED ORDER — MAGNESIUM SULFATE 4 GM/100ML IV SOLN
4.0000 g | Freq: Once | INTRAVENOUS | Status: AC
  Administered 2023-07-13: 4 g via INTRAVENOUS
  Filled 2023-07-13: qty 100

## 2023-07-13 MED ORDER — ORAL CARE MOUTH RINSE: 15.0000 mL | OROMUCOSAL | Status: DC | PRN

## 2023-07-13 MED ORDER — INSULIN REGULAR(HUMAN) IN NACL 100-0.9 UT/100ML-% IV SOLN: INTRAVENOUS | Status: DC

## 2023-07-13 MED ORDER — SODIUM CHLORIDE 0.9% FLUSH: 3.0000 mL | INTRAVENOUS | Status: DC | PRN

## 2023-07-13 MED ORDER — BISACODYL 10 MG RE SUPP: 10.0000 mg | Freq: Every day | RECTAL | Status: DC

## 2023-07-13 MED ORDER — PHENYLEPHRINE 80 MCG/ML (10ML) SYRINGE FOR IV PUSH (FOR BLOOD PRESSURE SUPPORT)
PREFILLED_SYRINGE | INTRAVENOUS | Status: DC | PRN
  Administered 2023-07-13 (×2): 80 ug via INTRAVENOUS
  Administered 2023-07-13: 160 ug via INTRAVENOUS
  Administered 2023-07-13: 20 ug via INTRAVENOUS
  Administered 2023-07-13: 80 ug via INTRAVENOUS
  Administered 2023-07-13: 160 ug via INTRAVENOUS

## 2023-07-13 MED ORDER — INSULIN ASPART 100 UNIT/ML IJ SOLN
0.0000 [IU] | Freq: Three times a day (TID) | INTRAMUSCULAR | Status: DC
  Administered 2023-07-14: 2 [IU] via SUBCUTANEOUS
  Administered 2023-07-14: 3 [IU] via SUBCUTANEOUS

## 2023-07-13 MED ORDER — DEXMEDETOMIDINE HCL IN NACL 400 MCG/100ML IV SOLN
0.0000 ug/kg/h | INTRAVENOUS | Status: DC
  Administered 2023-07-13: 0.7 ug/kg/h via INTRAVENOUS
  Filled 2023-07-13: qty 100

## 2023-07-13 MED ORDER — HEPARIN SODIUM (PORCINE) 1000 UNIT/ML IJ SOLN
INTRAMUSCULAR | Status: DC | PRN
  Administered 2023-07-13: 27000 [IU] via INTRAVENOUS

## 2023-07-13 MED ORDER — OXYCODONE HCL 5 MG PO TABS
5.0000 mg | ORAL_TABLET | ORAL | Status: DC | PRN
  Administered 2023-07-13: 5 mg via ORAL
  Administered 2023-07-14 (×2): 10 mg via ORAL
  Administered 2023-07-15 (×3): 5 mg via ORAL
  Administered 2023-07-16: 10 mg via ORAL
  Administered 2023-07-16 (×3): 5 mg via ORAL
  Administered 2023-07-17 – 2023-07-18 (×3): 10 mg via ORAL
  Filled 2023-07-13 (×2): qty 1
  Filled 2023-07-13: qty 2
  Filled 2023-07-13: qty 1
  Filled 2023-07-13: qty 2
  Filled 2023-07-13: qty 1
  Filled 2023-07-13: qty 2
  Filled 2023-07-13 (×3): qty 1
  Filled 2023-07-13 (×3): qty 2

## 2023-07-13 MED ORDER — ACETAMINOPHEN 160 MG/5ML PO SOLN: 1000.0000 mg | Freq: Four times a day (QID) | ORAL | Status: DC

## 2023-07-13 SURGICAL SUPPLY — 107 items
ADH SKN CLS APL DERMABOND .7 (GAUZE/BANDAGES/DRESSINGS) ×4
BAG DECANTER FOR FLEXI CONT (MISCELLANEOUS) ×2 IMPLANT
BLADE CLIPPER SURG (BLADE) ×2 IMPLANT
BLADE STERNUM SYSTEM 6 (BLADE) ×2 IMPLANT
BLADE SURG 11 STRL SS (BLADE) IMPLANT
BNDG CMPR 5X4 KNIT ELC UNQ LF (GAUZE/BANDAGES/DRESSINGS) ×4
BNDG CMPR 6 X 5 YARDS HK CLSR (GAUZE/BANDAGES/DRESSINGS) ×4
BNDG CMPR 6"X 5 YARDS HK CLSR (GAUZE/BANDAGES/DRESSINGS) ×4
BNDG ELASTIC 4INX 5YD STR LF (GAUZE/BANDAGES/DRESSINGS) IMPLANT
BNDG ELASTIC 4X5.8 VLCR STR LF (GAUZE/BANDAGES/DRESSINGS) ×2 IMPLANT
BNDG ELASTIC 6INX 5YD STR LF (GAUZE/BANDAGES/DRESSINGS) IMPLANT
BNDG ELASTIC 6X5.8 VLCR STR LF (GAUZE/BANDAGES/DRESSINGS) ×2 IMPLANT
BNDG GAUZE DERMACEA FLUFF 4 (GAUZE/BANDAGES/DRESSINGS) ×2 IMPLANT
BNDG GZE DERMACEA 4 6PLY (GAUZE/BANDAGES/DRESSINGS) ×4
CABLE SURGICAL S-101-97-12 (CABLE) ×2 IMPLANT
CANISTER SUCT 3000ML PPV (MISCELLANEOUS) ×2 IMPLANT
CANN PRFSN 3/8XCNCT ST RT ANG (MISCELLANEOUS) ×2
CANN PRFSN 3/8XRT ANG TPR 14 (MISCELLANEOUS) ×2
CANNULA MC2 2 STG 29/37 NON-V (CANNULA) ×2 IMPLANT
CANNULA NON VENT 20FR 12 (CANNULA) ×2 IMPLANT
CANNULA PRFSN 3/8XCNCT RT ANG (MISCELLANEOUS) IMPLANT
CANNULA PRFSN 3/8XRT ANG TPR14 (MISCELLANEOUS) IMPLANT
CANNULA SUMP PERICARDIAL (CANNULA) IMPLANT
CANNULA VEN MTL TIP RT (MISCELLANEOUS) ×4
CATH ROBINSON RED A/P 18FR (CATHETERS) ×4 IMPLANT
CLIP RETRACTION 3.0MM CORONARY (MISCELLANEOUS) IMPLANT
CLIP TI MEDIUM 24 (CLIP) IMPLANT
CLIP TI WIDE RED SMALL 24 (CLIP) IMPLANT
CNTNR URN SCR LID CUP LEK RST (MISCELLANEOUS) IMPLANT
CONN 1/2X1/2X1/2 BEN (MISCELLANEOUS) IMPLANT
CONN ST 1/2X1/2 BEN (MISCELLANEOUS) ×2 IMPLANT
CONN ST 3/8 X 1/2 (MISCELLANEOUS) IMPLANT
CONNECTOR BLAKE 2:1 CARIO BLK (MISCELLANEOUS) ×2 IMPLANT
CONT SPEC 4OZ STRL OR WHT (MISCELLANEOUS) ×2
CONTAINER PROTECT SURGISLUSH (MISCELLANEOUS) ×4 IMPLANT
COVER PROBE CYLINDRICAL 5X96 (MISCELLANEOUS) IMPLANT
DERMABOND ADVANCED .7 DNX12 (GAUZE/BANDAGES/DRESSINGS) IMPLANT
DRAIN CHANNEL 19F RND (DRAIN) ×6 IMPLANT
DRAIN CONNECTOR BLAKE 1:1 (MISCELLANEOUS) ×2 IMPLANT
DRAPE CARDIOVASCULAR INCISE (DRAPES) ×2
DRAPE INCISE IOBAN 66X45 STRL (DRAPES) IMPLANT
DRAPE SRG 135X102X78XABS (DRAPES) ×2 IMPLANT
DRAPE WARM FLUID 44X44 (DRAPES) ×2 IMPLANT
DRSG AQUACEL AG ADV 3.5X10 (GAUZE/BANDAGES/DRESSINGS) ×2 IMPLANT
ELECT BLADE 4.0 EZ CLEAN MEGAD (MISCELLANEOUS) ×2
ELECT REM PT RETURN 9FT ADLT (ELECTROSURGICAL) ×4
ELECTRODE BLDE 4.0 EZ CLN MEGD (MISCELLANEOUS) ×2 IMPLANT
ELECTRODE REM PT RTRN 9FT ADLT (ELECTROSURGICAL) ×4 IMPLANT
FELT TEFLON 1X6 (MISCELLANEOUS) ×4 IMPLANT
GAUZE 4X4 16PLY ~~LOC~~+RFID DBL (SPONGE) ×2 IMPLANT
GAUZE SPONGE 4X4 12PLY STRL (GAUZE/BANDAGES/DRESSINGS) ×4 IMPLANT
GAUZE SPONGE 4X4 12PLY STRL LF (GAUZE/BANDAGES/DRESSINGS) IMPLANT
GLOVE BIO SURGEON STRL SZ7 (GLOVE) ×4 IMPLANT
GLOVE BIO SURGEON STRL SZ7.5 (GLOVE) IMPLANT
GLOVE BIOGEL M STRL SZ7.5 (GLOVE) ×4 IMPLANT
GLOVE BIOGEL PI IND STRL 7.0 (GLOVE) IMPLANT
GLOVE BIOGEL PI IND STRL 9 (GLOVE) IMPLANT
GLOVE SURG SS PI 7.5 STRL IVOR (GLOVE) IMPLANT
GOWN STRL REUS W/ TWL LRG LVL3 (GOWN DISPOSABLE) ×8 IMPLANT
GOWN STRL REUS W/ TWL XL LVL3 (GOWN DISPOSABLE) ×4 IMPLANT
GOWN STRL REUS W/TWL LRG LVL3 (GOWN DISPOSABLE) ×8
GOWN STRL REUS W/TWL XL LVL3 (GOWN DISPOSABLE) ×6
HEMOSTAT POWDER SURGIFOAM 1G (HEMOSTASIS) ×4 IMPLANT
INSERT SUTURE HOLDER (MISCELLANEOUS) ×2 IMPLANT
KIT BASIN OR (CUSTOM PROCEDURE TRAY) ×2 IMPLANT
KIT SUCTION CATH 14FR (SUCTIONS) ×2 IMPLANT
KIT TURNOVER KIT B (KITS) ×2 IMPLANT
KIT VASOVIEW HEMOPRO 2 VH 4000 (KITS) ×2 IMPLANT
LEAD PACING MYOCARDI (MISCELLANEOUS) ×2 IMPLANT
LINE VENT (MISCELLANEOUS) IMPLANT
MARKER GRAFT CORONARY BYPASS (MISCELLANEOUS) ×6 IMPLANT
NS IRRIG 1000ML POUR BTL (IV SOLUTION) ×10 IMPLANT
PACK E OPEN HEART (SUTURE) ×2 IMPLANT
PACK OPEN HEART (CUSTOM PROCEDURE TRAY) ×2 IMPLANT
PAD ARMBOARD 7.5X6 YLW CONV (MISCELLANEOUS) ×4 IMPLANT
PAD ELECT DEFIB RADIOL ZOLL (MISCELLANEOUS) ×2 IMPLANT
PENCIL BUTTON HOLSTER BLD 10FT (ELECTRODE) ×2 IMPLANT
POSITIONER HEAD DONUT 9IN (MISCELLANEOUS) ×2 IMPLANT
PUNCH AORTIC ROTATE 4.0MM (MISCELLANEOUS) ×2 IMPLANT
SET MPS 3-ND DEL (MISCELLANEOUS) IMPLANT
SPONGE T-LAP 18X18 ~~LOC~~+RFID (SPONGE) ×8 IMPLANT
SUPPORT HEART JANKE-BARRON (MISCELLANEOUS) ×2 IMPLANT
SUT BONE WAX W31G (SUTURE) ×2 IMPLANT
SUT ETHIBOND 2 0 SH (SUTURE) ×2
SUT ETHIBOND 2 0 SH 36X2 (SUTURE) IMPLANT
SUT ETHIBOND X763 2 0 SH 1 (SUTURE) ×4 IMPLANT
SUT MNCRL AB 3-0 PS2 18 (SUTURE) ×4 IMPLANT
SUT MNCRL AB 4-0 PS2 18 (SUTURE) IMPLANT
SUT PDS AB 1 CTX 36 (SUTURE) ×4 IMPLANT
SUT PROLENE 4 0 RB 1 (SUTURE) ×4
SUT PROLENE 4 0 SH DA (SUTURE) ×2 IMPLANT
SUT PROLENE 4-0 RB1 .5 CRCL 36 (SUTURE) IMPLANT
SUT PROLENE 5 0 C 1 36 (SUTURE) ×6 IMPLANT
SUT PROLENE 7 0 BV 1 (SUTURE) IMPLANT
SUT PROLENE 7 0 BV1 MDA (SUTURE) ×2 IMPLANT
SUT STEEL 6MS V (SUTURE) ×4 IMPLANT
SUT VIC AB 2-0 CT1 27 (SUTURE) ×4
SUT VIC AB 2-0 CT1 TAPERPNT 27 (SUTURE) IMPLANT
SYSTEM SAHARA CHEST DRAIN ATS (WOUND CARE) ×2 IMPLANT
TAPE CLOTH SURG 4X10 WHT LF (GAUZE/BANDAGES/DRESSINGS) IMPLANT
TAPE PAPER 2X10 WHT MICROPORE (GAUZE/BANDAGES/DRESSINGS) IMPLANT
TOWEL GREEN STERILE (TOWEL DISPOSABLE) ×2 IMPLANT
TOWEL GREEN STERILE FF (TOWEL DISPOSABLE) ×2 IMPLANT
TRAY FOLEY SLVR 16FR TEMP STAT (SET/KITS/TRAYS/PACK) ×2 IMPLANT
TUBING LAP HI FLOW INSUFFLATIO (TUBING) ×2 IMPLANT
UNDERPAD 30X36 HEAVY ABSORB (UNDERPADS AND DIAPERS) ×2 IMPLANT
WATER STERILE IRR 1000ML POUR (IV SOLUTION) ×4 IMPLANT

## 2023-07-13 NOTE — Anesthesia Procedure Notes (Signed)
Arterial Line Insertion Start/End7/17/2024 7:00 AM, 07/13/2023 7:10 AM Performed by: Darryl Nestle, CRNA, CRNA  Patient location: Pre-op. Preanesthetic checklist: patient identified, IV checked, site marked, risks and benefits discussed, surgical consent, monitors and equipment checked, pre-op evaluation, timeout performed and anesthesia consent Lidocaine 1% used for infiltration Left, radial was placed Catheter size: 20 G Hand hygiene performed  and maximum sterile barriers used   Attempts: 2 Procedure performed using ultrasound guided technique. Ultrasound Notes:anatomy identified, needle tip was noted to be adjacent to the nerve/plexus identified and no ultrasound evidence of intravascular and/or intraneural injection Following insertion, dressing applied and Biopatch. Post procedure assessment: normal and unchanged  Post procedure complications: unsuccessful attempts. Patient tolerated the procedure well with no immediate complications.

## 2023-07-13 NOTE — Brief Op Note (Signed)
07/13/2023  2:17 PM  PATIENT:  Thomas Harper  62 y.o. male  PRE-OPERATIVE DIAGNOSIS:  CAD, Left Atrial Myxoma  POST-OPERATIVE DIAGNOSIS:  CAD, Left Atrial Myxoma  PROCEDURE:   CORONARY ARTERY BYPASS GRAFTING  x 3 USING LEFT INTERNAL MAMMARY ARTERY (LIMA) AND ENDOSCOPICALLY HARVESTED LEFT GREATER SAPHENOUS VEIN   RESECTION LEFT ATRIAL MYXOMA    STERNAL CYST REMOVAL   Vein harvest time: Vein prep time:  TRANSESOPHAGEAL ECHOCARDIOGRAM   SURGEON:  Corliss Skains, MD - Primary  PHYSICIAN ASSISTANT: Kambrey Hagger  ASSISTANTS: Waldron Labs, RN, RN First Assistant    ANESTHESIA:   general  EBL:   BLOOD ADMINISTERED:none  DRAINS:  Left pleural and mediastinal drains    LOCAL MEDICATIONS USED:  NONE  SPECIMEN:  Left atrial myxoma  DISPOSITION OF SPECIMEN:  PATHOLOGY  COUNTS:  Correct  DICTATION: .Dragon Dictation  PLAN OF CARE: Admit to inpatient   PATIENT DISPOSITION:  ICU - intubated and hemodynamically stable.   Delay start of Pharmacological VTE agent (>24hrs) due to surgical blood loss or risk of bleeding: yes

## 2023-07-13 NOTE — Anesthesia Procedure Notes (Signed)
Central Venous Catheter Insertion Performed by: Val Eagle, MD, anesthesiologist Start/End7/17/2024 7:20 AM, 07/13/2023 7:30 AM Patient location: Pre-op. Preanesthetic checklist: patient identified, IV checked, risks and benefits discussed, surgical consent, monitors and equipment checked, pre-op evaluation, timeout performed and anesthesia consent Lidocaine 1% used for infiltration and patient sedated Hand hygiene performed  and maximum sterile barriers used  Catheter size: 8.5 Fr Sheath introducer Procedure performed using ultrasound guided technique. Ultrasound Notes:anatomy identified, needle tip was noted to be adjacent to the nerve/plexus identified, no ultrasound evidence of intravascular and/or intraneural injection and image(s) printed for medical record Attempts: 1 Following insertion, line sutured, dressing applied and Biopatch. Post procedure assessment: blood return through all ports, free fluid flow and no air  Patient tolerated the procedure well with no immediate complications.
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
## 2023-07-13 NOTE — Procedures (Signed)
Extubation Procedure Note  Patient Details:   Name: Thomas Harper DOB: [birth year] MRN: [id number]   Airway Documentation:    Vent end date: 07/13/23 Vent end time: 2253   Evaluation  O2 sats: stable throughout Complications: No apparent complications Patient did tolerate procedure well. Bilateral Breath Sounds: Clear   Yes  NIF -30 VC 0.75L Pos cuff leak was noted and pt was able to speak post procedure.  Idelle Leech 07/13/2023, 10:54 PM

## 2023-07-13 NOTE — Anesthesia Preprocedure Evaluation (Signed)
Anesthesia Evaluation  Patient identified by MRN, date of birth, ID band Patient awake    Reviewed: Allergy & Precautions, NPO status , Patient's Chart, lab work & pertinent test results  History of Anesthesia Complications Negative for: history of anesthetic complications  Airway Mallampati: III  TM Distance: >3 FB Neck ROM: Full    Dental  (+) Dental Advisory Given,    Pulmonary neg shortness of breath, neg sleep apnea, neg COPD, neg recent URI, former smoker   breath sounds clear to auscultation       Cardiovascular hypertension, Pt. on medications and Pt. on home beta blockers + angina  + CAD   Rhythm:Regular     Neuro/Psych  Headaches  negative psych ROS   GI/Hepatic Neg liver ROS,GERD  Medicated,,  Endo/Other  negative endocrine ROS    Renal/GU Renal disease     Musculoskeletal negative musculoskeletal ROS (+)    Abdominal   Peds  Hematology negative hematology ROS (+)   Anesthesia Other Findings   Reproductive/Obstetrics                             Anesthesia Physical Anesthesia Plan  ASA: 4  Anesthesia Plan: General   Post-op Pain Management:    Induction: Intravenous  PONV Risk Score and Plan: 2 and Ondansetron and Treatment may vary due to age or medical condition  Airway Management Planned: Oral ETT  Additional Equipment: Arterial line, CVP, TEE and Ultrasound Guidance Line Placement  Intra-op Plan:   Post-operative Plan: Post-operative intubation/ventilation  Informed Consent: I have reviewed the patients History and Physical, chart, labs and discussed the procedure including the risks, benefits and alternatives for the proposed anesthesia with the patient or authorized representative who has indicated his/her understanding and acceptance.     Dental advisory given  Plan Discussed with: CRNA  Anesthesia Plan Comments:        Anesthesia Quick  Evaluation

## 2023-07-13 NOTE — Interval H&P Note (Signed)
History and Physical Interval Note:  07/13/2023 8:21 AM  Thomas Harper  has presented today for surgery, with the diagnosis of CAD, Myxoma.  The various methods of treatment have been discussed with the patient and family. After consideration of risks, benefits and other options for treatment, the patient has consented to  Procedure(s): CORONARY ARTERY BYPASS GRAFTING (CABG) (N/A) LEFT ATRIAL MYXOMA RESECTION (N/A) TRANSESOPHAGEAL ECHOCARDIOGRAM (N/A) STERNAL CYST REMOVAL (N/A) as a surgical intervention.  The patient's history has been reviewed, patient examined, no change in status, stable for surgery.  I have reviewed the patient's chart and labs.  Questions were answered to the patient's satisfaction.     [name]

## 2023-07-13 NOTE — Transfer of Care (Signed)
Immediate Anesthesia Transfer of Care Note  Patient: Thomas Harper  Procedure(s) Performed: CORONARY ARTERY BYPASS GRAFTING (CABG) x 3 USING LEFT INTERNAL MAMMARY ARTERY (LIMA) AND ENDOSCOPICALLY HARVESTED LEFT GREATER SAPHENOUS VEIN (Chest) LEFT ATRIAL MYXOMA RESECTION TRANSESOPHAGEAL ECHOCARDIOGRAM STERNAL CYST REMOVAL  Patient Location: ICU  Anesthesia Type:General  Level of Consciousness: Patient remains intubated per anesthesia plan  Airway & Oxygen Therapy: Patient remains intubated per anesthesia plan and Patient placed on Ventilator (see vital sign flow sheet for setting)  Post-op Assessment: Report given to RN and Post -op Vital signs reviewed and stable  Post vital signs: Reviewed and stable  Last Vitals:  Vitals Value Taken Time  BP 126/85 07/13/23 1603  Temp    Pulse 80 07/13/23 1614  Resp 16 07/13/23 1614  SpO2 100 % 07/13/23 1614  Vitals shown include unfiled device data.  Last Pain:  Vitals:   07/13/23 0656  TempSrc:   PainSc: 0-No pain      Patients Stated Pain Goal: 0 (07/13/23 0656)  Complications: No notable events documented.

## 2023-07-13 NOTE — Anesthesia Procedure Notes (Signed)
Central Venous Catheter Insertion Performed by: Val Eagle, MD, anesthesiologist Start/End7/17/2024 7:20 AM, 07/13/2023 7:30 AM Patient location: Pre-op. Preanesthetic checklist: patient identified, IV checked, risks and benefits discussed, surgical consent, monitors and equipment checked, pre-op evaluation, timeout performed and anesthesia consent Hand hygiene performed  and maximum sterile barriers used  Total catheter length 16. Central line was placed.Triple lumen Procedure performed without using ultrasound guided technique. Attempts: 1 Post procedure assessment: blood return through all ports  Patient tolerated the procedure well with no immediate complications. Additional procedure comments: Slick inserted through introducer, no ultrasound needed.

## 2023-07-13 NOTE — Progress Notes (Signed)
  Echocardiogram Echocardiogram Transesophageal has been performed.  Thomas Harper 07/13/2023, 9:41 AM

## 2023-07-13 NOTE — Anesthesia Procedure Notes (Addendum)
Procedure Name: Intubation Date/Time: 07/13/2023 9:02 AM  Performed by: Darryl Nestle, CRNAPre-anesthesia Checklist: Patient identified, Emergency Drugs available, Suction available and Patient being monitored Patient Re-evaluated:Patient Re-evaluated prior to induction Oxygen Delivery Method: Circle system utilized Preoxygenation: Pre-oxygenation with 100% oxygen Induction Type: IV induction Ventilation: Oral airway inserted - appropriate to patient size and Mask ventilation with difficulty Laryngoscope Size: Mac and 3 Grade View: Grade I Tube type: Oral Tube size: 8.0 mm Number of attempts: 1 Airway Equipment and Method: Stylet and Oral airway Placement Confirmation: ETT inserted through vocal cords under direct vision, positive ETCO2 and breath sounds checked- equal and bilateral Secured at: 23 cm Tube secured with: Tape Dental Injury: Teeth and Oropharynx as per pre-operative assessment

## 2023-07-13 NOTE — Op Note (Signed)
[address]       [place] [address]             [phone]                                          07/13/2023 Patient:  Thomas Harper Pre-Op Dx: Three-vessel coronary disease   Left atrial myxoma   Chest wall cyst Post-op Dx: Same Procedure: CABG X 3.  LIMA to LAD, reverse saphenous vein graft to the PDA and obtuse marginal Endoscopic greater saphenous vein harvest on the left Resection left atrial myxoma Resection chest wall cyst   Surgeon and Role:      * Tykesha Konicki, Eliezer Lofts, MD - Primary    *Gaynelle Arabian, PA-C - assisting An experienced assistant was required given the complexity of this surgery and the standard of surgical care. The assistant was needed for exposure, dissection, suctioning, retraction of delicate tissues and sutures, instrument exchange and for overall help during this procedure.    Anesthesia  general EBL: 500 ml Blood Administration: None Xclamp Time: 124 min Pump Time: 160 min  Drains: 19 F blake drain: R, L, mediastinal  Wires: None Counts: correct   Indications: 61yo male with 3V CAD, a left atrial myxoma, and chest wall cyst.  His echo shows preserved biventricular function, and no significant valvular disease.  We discussed the risks and benefit for CABG, left atrial myxoma resection, and chest wall cyst removal.  He is   Findings: Chest wall systems noted right above the sternum.  An elliptical incision was made around this along with our sternotomy incision and was resected with Bovie cautery.    Small LIMA, and thin atretic vein conduit.  LAD was a good target.  PDA and obtuse marginal with good targets.  We recently attempted to enter the right atrium, and cross the septum.  It was difficult to identify the myxoma.  We then opened up the left atrial through Waterston's groove and again it was difficult to identify the myxoma, we then opened up the dome of the left atrium and identified the myxoma.  It was  resectedin its entirety.  Primary closure of the left and right atrial.  Operative Technique: All invasive lines were placed in pre-op holding.  After the risks, benefits and alternatives were thoroughly discussed, the patient was brought to the operative theatre.  Anesthesia was induced, and the patient was prepped and draped in normal sterile fashion.  An appropriate surgical pause was performed, and pre-operative antibiotics were dosed accordingly.  We began with simultaneous incisions along the left leg for harvesting of the greater saphenous vein and the chest for the sternotomy.  In regards to the sternotomy, this was carried down with bovie cautery.  Elliptical incision was made around the subcutaneous cyst this was resected and the sternotomy incision.  The sternum was divided with a reciprocating saw.  Meticulous hemostasis was obtained.  The left internal thoracic artery was exposed and harvested in in pedicled fashion.  The patient was systemically heparinized, and the artery was divided distally, and placed in a papaverine sponge.    The sternal elevator was removed, and a retractor was placed.  The pericardium was divided in the midline and fashioned into a cradle with pericardial stitches.   After we confirmed an appropriate ACT, the ascending aorta was cannulated in  standard fashion.  Bicaval venous cannulation was performed.  Cardiopulmonary bypass was initiated, and the heart retractor was placed. The cross clamp was applied, and a dose of anterograde cardioplegia was given with good arrest of the heart.  We moved to the posterior wall of the heart, and found a good target on the PDA.  An arteriotomy was made, and the vein graft was anastomosed to it in an end to side fashion.  Next we exposed the lateral wall, and found a good target on the obtuse marginal.  An end to side anastomosis with the vein graft was then created.  Finally, we exposed a good target on the LAD, and fashioned an end to  side anastomosis between it and the LITA.    Next we opened up the right atrium and attempted to cross the septum to identify the myxoma.  Unable to located.  Left atrium was then opened up through Morison's groove, but the myxoma also is not evident.  Finally we opened up the dome of the left atrium, and noted Depixol to be intact at this level.  The myxoma was removed in this entirety.  Primary closure of the left atrium, the interatrial septum, and morcellation was performed with 4-0 Prolene pledgeted sutures.  We began to re-warm, and a re-animation dose of cardioplegia was given.  The heart was de-aired, and the cross clamp was removed.  Meticulous hemostasis was obtained.    A partial occludding clamp was then placed on the ascending aorta, and we created an end to side anastomosis between it and the proximal vein grafts.  Rings were placed on the proximal anastomosis.  Hemostasis was obtained, and we separated from cardiopulmonary bypass without event.  The heparin was reversed with protamine.  Chest tubes and wires were placed, and the sternum was re-approximated with sternal wires.  The soft tissue and skin were re-approximated wth absorbable suture.    The patient tolerated the procedure without any immediate complications, and was transferred to the ICU in guarded condition.  Thomas Harper

## 2023-07-14 ENCOUNTER — Encounter (HOSPITAL_COMMUNITY): Payer: Self-pay | Admitting: Thoracic Surgery (Cardiothoracic Vascular Surgery)

## 2023-07-14 ENCOUNTER — Inpatient Hospital Stay (HOSPITAL_COMMUNITY): Payer: 59

## 2023-07-14 DIAGNOSIS — Z951 Presence of aortocoronary bypass graft: Secondary | ICD-10-CM | POA: Diagnosis not present

## 2023-07-14 LAB — GLUCOSE, CAPILLARY
Glucose-Capillary: 172 mg/dL — ABNORMAL HIGH (ref 70–99)
Glucose-Capillary: 154 mg/dL — ABNORMAL HIGH (ref 70–99)
Glucose-Capillary: 141 mg/dL — ABNORMAL HIGH (ref 70–99)
Glucose-Capillary: 152 mg/dL — ABNORMAL HIGH (ref 70–99)
Glucose-Capillary: 114 mg/dL — ABNORMAL HIGH (ref 70–99)
Glucose-Capillary: 128 mg/dL — ABNORMAL HIGH (ref 70–99)

## 2023-07-14 LAB — BASIC METABOLIC PANEL
Anion gap: 10 (ref 5–15)
Anion gap: 9 (ref 5–15)
BUN: 16 mg/dL (ref 8–23)
BUN: 16 mg/dL (ref 8–23)
CO2: 20 mmol/L — ABNORMAL LOW (ref 22–32)
CO2: 22 mmol/L (ref 22–32)
Calcium: 7.7 mg/dL — ABNORMAL LOW (ref 8.9–10.3)
Calcium: 8.1 mg/dL — ABNORMAL LOW (ref 8.9–10.3)
Chloride: 103 mmol/L (ref 98–111)
Chloride: 100 mmol/L (ref 98–111)
Creatinine, Ser: 0.95 mg/dL (ref 0.61–1.24)
Creatinine, Ser: 0.97 mg/dL (ref 0.61–1.24)
GFR, Estimated: >60 mL/min (ref >60)
GFR, Estimated: >60 mL/min (ref >60)
Glucose, Bld: 175 mg/dL — ABNORMAL HIGH (ref 70–99)
Glucose, Bld: 158 mg/dL — ABNORMAL HIGH (ref 70–99)
Potassium: 4.2 mmol/L (ref 3.5–5.1)
Potassium: 4.1 mmol/L (ref 3.5–5.1)
Sodium: 133 mmol/L — ABNORMAL LOW (ref 135–145)
Sodium: 131 mmol/L — ABNORMAL LOW (ref 135–145)

## 2023-07-14 LAB — CBC
HCT: 30.6 % — ABNORMAL LOW (ref 39.0–52.0)
HCT: 32.7 % — ABNORMAL LOW (ref 39.0–52.0)
Hemoglobin: 10.1 g/dL — ABNORMAL LOW (ref 13.0–17.0)
Hemoglobin: 10.8 g/dL — ABNORMAL LOW (ref 13.0–17.0)
MCH: 29.6 pg (ref 26.0–34.0)
MCH: 29.5 pg (ref 26.0–34.0)
MCHC: 33 g/dL (ref 30.0–36.0)
MCHC: 33 g/dL (ref 30.0–36.0)
MCV: 89.7 fL (ref 80.0–100.0)
MCV: 89.3 fL (ref 80.0–100.0)
Platelets: 152 10*3/uL (ref 150–400)
Platelets: 193 10*3/uL (ref 150–400)
RBC: 3.41 MIL/uL — ABNORMAL LOW (ref 4.22–5.81)
RBC: 3.66 MIL/uL — ABNORMAL LOW (ref 4.22–5.81)
RDW: 13.4 % (ref 11.5–15.5)
RDW: 13.7 % (ref 11.5–15.5)
WBC: 11.8 10*3/uL — ABNORMAL HIGH (ref 4.0–10.5)
WBC: 18.1 10*3/uL — ABNORMAL HIGH (ref 4.0–10.5)
nRBC: 0 % (ref 0.0–0.2)
nRBC: 0 % (ref 0.0–0.2)

## 2023-07-14 LAB — MAGNESIUM
Magnesium: 2.4 mg/dL (ref 1.7–2.4)
Magnesium: 2.4 mg/dL (ref 1.7–2.4)

## 2023-07-14 LAB — SURGICAL PATHOLOGY

## 2023-07-14 MED ORDER — INSULIN ASPART 100 UNIT/ML IJ SOLN
0.0000 [IU] | INTRAMUSCULAR | Status: DC
  Administered 2023-07-14 – 2023-07-15 (×3): 2 [IU] via SUBCUTANEOUS

## 2023-07-14 MED ORDER — ORAL CARE MOUTH RINSE: 15.0000 mL | OROMUCOSAL | Status: DC | PRN

## 2023-07-14 MED ORDER — CLOPIDOGREL BISULFATE 75 MG PO TABS
75.0000 mg | ORAL_TABLET | Freq: Every day | ORAL | Status: DC
  Administered 2023-07-14 – 2023-07-18 (×5): 75 mg via ORAL
  Filled 2023-07-14 (×5): qty 1

## 2023-07-14 MED ORDER — ASPIRIN 81 MG PO TBEC
81.0000 mg | DELAYED_RELEASE_TABLET | Freq: Every day | ORAL | Status: DC
  Administered 2023-07-14 – 2023-07-18 (×5): 81 mg via ORAL
  Filled 2023-07-14 (×5): qty 1

## 2023-07-14 MED ORDER — CLONAZEPAM 0.5 MG PO TABS
0.5000 mg | ORAL_TABLET | Freq: Three times a day (TID) | ORAL | Status: DC | PRN
  Administered 2023-07-14 – 2023-07-18 (×5): 0.5 mg via ORAL
  Filled 2023-07-14 (×5): qty 1
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
## 2023-07-14 NOTE — Progress Notes (Signed)
[address]       [place] [address]             [phone]                 1 Day Post-Op Procedure(s) (LRB): CORONARY ARTERY BYPASS GRAFTING (CABG) x 3 USING LEFT INTERNAL MAMMARY ARTERY (LIMA) AND ENDOSCOPICALLY HARVESTED LEFT GREATER SAPHENOUS VEIN (N/A) LEFT ATRIAL MYXOMA RESECTION (N/A) TRANSESOPHAGEAL ECHOCARDIOGRAM (N/A) STERNAL CYST REMOVAL (N/A)   Events: No events _______________________________________________________________ Vitals: BP 118/72   Pulse 66   Temp 98.1 F (36.7 C) (Core)   Resp 20   Ht 5\' 6"  (1.676 m)   Wt 79.7 kg   SpO2 97%   BMI 28.34 kg/m  Filed Weights   07/13/23 0639 07/14/23 0500  Weight: 75.8 kg 79.7 kg     - Neuro: alert NAD  - Cardiovascular: Sinus  Drips: none.   CVP:  [5 mmHg-17 mmHg] 12 mmHg CO:  [3.8 L/min-5.2 L/min] 5.2 L/min  - Pulm: EWOB   ABG    Component Value Date/Time   PHART 7.355 07/13/2023 2205   PCO2ART 37.6 07/13/2023 2205   PO2ART 148 (H) 07/13/2023 2205   HCO3 21.1 07/13/2023 2205   TCO2 22 07/13/2023 2205   ACIDBASEDEF 4.0 (H) 07/13/2023 2205   O2SAT 99 07/13/2023 2205    - Abd: ND - Extremity: warm  .Intake/Output      07/17 0701 07/18 0700 07/18 0701 07/19 0700   I.V. (mL/kg) 3427.6 (43)    Blood 450    IV Piggyback 1690    Total Intake(mL/kg) 5567.6 (69.9)    Urine (mL/kg/hr) 2560 (1.3)    Blood 850    Chest Tube 347    Total Output 3757    Net +1810.6            _______________________________________________________________ Labs:    Latest Ref Rng & Units 07/14/2023    4:31 AM 07/13/2023   10:05 PM 07/13/2023   10:03 PM  CBC  WBC 4.0 - 10.5 K/uL 11.8   11.1   Hemoglobin 13.0 - 17.0 g/dL 56.3  87.5  64.3   Hematocrit 39.0 - 52.0 % 30.6  30.0  31.8   Platelets 150 - 400 K/uL 152   139       Latest Ref Rng & Units 07/14/2023    4:31 AM 07/13/2023   10:05 PM 07/13/2023   10:03 PM  CMP  Glucose 70 - 99 mg/dL 329   518   BUN 8 - 23 mg/dL 16   15    Creatinine 8.41 - 1.24 mg/dL 6.60   6.30   Sodium 160 - 145 mmol/L 133  139  136   Potassium 3.5 - 5.1 mmol/L 4.2  5.0  4.3   Chloride 98 - 111 mmol/L 103   106   CO2 22 - 32 mmol/L 20   20   Calcium 8.9 - 10.3 mg/dL 7.7   7.8   Total Protein 6.5 - 8.1 g/dL   5.2   Total Bilirubin 0.3 - 1.2 mg/dL   1.5   Alkaline Phos 38 - 126 U/L   40   AST 15 - 41 U/L   69   ALT 0 - 44 U/L   26     CXR: PV congestion  _______________________________________________________________  Assessment and Plan: POD 1 s/p CABG, myxoma resection  Neuro: adjusting pain meds CV: on A/S/BB.  Will start plavix today for poor  conduit Pulm: IS, ambulaton Renal: creat stable GI: on diet Heme: stable ID: afebrile Endo: SSI  Dispo: continue ICU care   Corliss Skains 07/14/2023 7:47 AM

## 2023-07-14 NOTE — Hospital Course (Signed)
Referring: Lennette Bihari, MD Primary Care: Dale Ravenel, MD Primary Cardiologist:Christopher Karlyne Greenspan, MD     History of Present Illness:     Thomas Harper 62 y.o. male presents for surgical evaluation of 3V CAD, and a left atrial myxoma.  He has had progressive shortness of breath, and anginal pain for the past several months.  This prompted his evaluation.  He is symptomatic with activity only at this point.  On exam he was also noted to have a chest wall cyst. His echo shows preserved biventricular function, and no significant valvular disease. We discussed the risks and benefit for CABG, left atrial myxoma resection, and chest wall cyst removal. He is agreeable to proceed.   Hospital Course: [name] was admitted for elective surgery on 07/13/2023 and taken to the OR where ABG x 3 was carried out along with resection of the left atrial myxoma and excision of the chest wall cyst.  Following the procedure he separated from cardiopulmonary bypass without difficulty and was transferred to the ICU in stable condition. He was stable hemodynamically and was weaned from the mechanical ventilator support for extubation by 11pm on the day of surgery. He was started on Plavix on post-op day 1 for small venous and arterial conduits available for grafting. He was mobilized and diet was advanced. Chest tubes and monitoring lines were removed routinely. He was transitioned off the Insulin drip. His pre op HGA1C was 5.9. He likely has pre diabetes. He will need further surveillance as an outpatient and we will provide nutrition information with discharge paperwork. He had expected post op blood loss anemia and did not require a post op transfusion. He was transferred to [facility] on post-op day 3. He remained in a stable sinus rhythm.  He had hypokalemia on post op day 4 and was supplemented accordingly. He has been tolerating a diet and has had bowel movements. He has been ambulating on room air  with good oxygenation. PT evaluated and recommended a rolling walker, which was arranged. All wounds are clean, dry, healing without signs of infection. He has mild volume overload and is being diuresed accordingly. I spoke with patient regarding stopping Nexium as he is now on Plavix. He understands but I did tell him he may take Protonix, Prevacid, or Dexilant. He is stable for discharge today.

## 2023-07-14 NOTE — Discharge Summary (Signed)
Physician Discharge Summary  [name] MRN: [id number] DOB/AGE: [birth year] 62 y.o.  Admit date: 07/13/2023 Discharge date: 07/18/2023  Admission Diagnoses: Patient Active Problem List   Diagnosis Date Noted   S/P CABG x 3 07/13/2023   CAD (coronary artery disease) 06/20/2023   Atrial myxoma 06/06/2023   Wart 03/19/2023   Snoring 11/13/2022   SOB (shortness of breath) 11/12/2022   Cutaneous abscess of back excluding buttocks 01/22/2022   Weight loss 10/19/2019   Chest pain 11/12/2018   Hand pain, left 09/10/2017   Right foot pain 09/09/2017   Dermatitis due to plants, including poison ivy, sumac, and oak 05/30/2017   Hypertension 10/24/2016   Left arm pain 09/12/2016   Hyperglycemia 05/07/2016   Headache 12/07/2015   Health care maintenance 07/07/2015   Tobacco use disorder 03/25/2014   Family history of colon cancer 03/25/2013   Kidney stones 03/25/2013   Vitamin D deficiency, unspecified 03/25/2013   Pure hypercholesterolemia 03/25/2013   Pain in soft tissues of limb 03/25/2013   Esophageal reflux 03/25/2013   Family history of malignant neoplasm of gastrointestinal tract 03/25/2013    Discharge Diagnoses:  Patient Active Problem List   Diagnosis Date Noted   S/P CABG x 3 07/13/2023   CAD (coronary artery disease) 06/20/2023   Atrial myxoma 06/06/2023   Wart 03/19/2023   Snoring 11/13/2022   SOB (shortness of breath) 11/12/2022   Cutaneous abscess of back excluding buttocks 01/22/2022   Weight loss 10/19/2019   Chest pain 11/12/2018   Hand pain, left 09/10/2017   Right foot pain 09/09/2017   Dermatitis due to plants, including poison ivy, sumac, and oak 05/30/2017   Hypertension 10/24/2016   Left arm pain 09/12/2016   Hyperglycemia 05/07/2016   Headache 12/07/2015   Health care maintenance 07/07/2015   Tobacco use disorder 03/25/2014   Family history of colon cancer 03/25/2013   Kidney stones 03/25/2013   Vitamin D deficiency, unspecified  03/25/2013   Pure hypercholesterolemia 03/25/2013   Pain in soft tissues of limb 03/25/2013   Esophageal reflux 03/25/2013   Family history of malignant neoplasm of gastrointestinal tract 03/25/2013    Principal Problem:   S/P CABG x 3   Discharged Condition: stable  Referring: Lennette Bihari, MD Primary Care: Dale Ensley, MD Primary Cardiologist:Christopher Karlyne Greenspan, MD     History of Present Illness:     Thomas Harper 62 y.o. male presents for surgical evaluation of 3V CAD, and a left atrial myxoma.  He has had progressive shortness of breath, and anginal pain for the past several months.  This prompted his evaluation.  He is symptomatic with activity only at this point.  On exam he was also noted to have a chest wall cyst. His echo shows preserved biventricular function, and no significant valvular disease. We discussed the risks and benefit for CABG, left atrial myxoma resection, and chest wall cyst removal. He is agreeable to proceed.   Hospital Course: [name] was admitted for elective surgery on 07/13/2023 and taken to the OR where ABG x 3 was carried out along with resection of the left atrial myxoma and excision of the chest wall cyst.  Following the procedure he separated from cardiopulmonary bypass without difficulty and was transferred to the ICU in stable condition. He was stable hemodynamically and was weaned from the mechanical ventilator support for extubation by 11pm on the day of surgery. He was started on Plavix on post-op day 1 for small venous and arterial  conduits available for grafting. He was mobilized and diet was advanced. Chest tubes and monitoring lines were removed routinely. He was transitioned off the Insulin drip. His pre op HGA1C was 5.9. He likely has pre diabetes. He will need further surveillance as an outpatient and we will provide nutrition information with discharge paperwork. He had expected post op blood loss anemia and did not require a post  op transfusion. He was transferred to [facility] on post-op day 3. He remained in a stable sinus rhythm.  He had hypokalemia on post op day 4 and was supplemented accordingly. He has been tolerating a diet and has had bowel movements. He has been ambulating on room air with good oxygenation. PT evaluated and recommended a rolling walker, which was arranged. All wounds are clean, dry, healing without signs of infection. He has mild volume overload and is being diuresed accordingly. I spoke with patient regarding stopping Nexium as he is now on Plavix. He understands but I did tell him he may take Protonix, Prevacid, or Dexilant. He is stable for discharge today.  Consults: pulmonary/intensive care  Significant Diagnostic Studies:   Narrative & Impression  CLINICAL DATA:  [id number] S/P CABG x 3 [id number]   EXAM: CHEST - 2 VIEW   COMPARISON:  07/15/2023   FINDINGS: Interval removal of right IJ catheter and mediastinal drains. Relatively low lung volumes with some residual basilar atelectasis or infiltrate left greater than right stable.   Heart size and mediastinal contours are within normal limits. CABG markers.   Small bilateral pleural effusions.   Sternotomy wires.   IMPRESSION: Low lung volumes with bibasilar atelectasis or infiltrate and small effusions.     Electronically Signed   By: Corlis Leak M.D.   On: 07/17/2023 09:15   Treatments: surgery:  CABG X 3.  LIMA to LAD, reverse saphenous vein graft to the PDA and obtuse marginal Endoscopic greater saphenous vein harvest on the left Resection left atrial myxoma Resection chest wall cyst by Dr. Cliffton Asters on 07/13/2023.  Discharge Exam: Blood pressure 121/85, pulse 89, temperature 98.1 F (36.7 C), temperature source Oral, resp. rate 19, height 5\' 6"  (1.676 m), weight 75.3 kg, SpO2 96%. Cardiovascular: RRR Pulmonary: Clear to auscultation bilaterally Abdomen: Soft, non tender, bowel sounds present. Extremities:  Trace bilateral lower extremity edema. Ecchymosis left thigh Wounds: Clean and dry.  No erythema or signs of infection.  Disposition: Discharge to home.  Discharge Instructions     Amb Referral to Cardiac Rehabilitation   Complete by: As directed    Diagnosis: CABG   CABG X ___: 3   After initial evaluation and assessments completed: Virtual Based Care may be provided alone or in conjunction with Phase 2 Cardiac Rehab based on patient barriers.: Yes   Intensive Cardiac Rehabilitation (ICR) [facility] location only OR Traditional Cardiac Rehabilitation (TCR) *If criteria for ICR are not met will enroll in TCR [facility] only): Yes      Allergies as of 07/18/2023       Reactions   Fish Oil Rash   Lisinopril Other (See Comments)   Fatigue   Simvastatin Rash        Medication List     STOP taking these medications    amLODipine 10 MG tablet Commonly known as: NORVASC   esomeprazole 20 MG capsule Commonly known as: NEXIUM   isosorbide mononitrate 30 MG 24 hr tablet Commonly known as: IMDUR   nitroGLYCERIN 0.4 MG SL tablet Commonly known as: NITROSTAT  TAKE these medications    acetaminophen 500 MG tablet Commonly known as: TYLENOL Take 1,000 mg by mouth every 8 (eight) hours as needed for headache.   aspirin EC 81 MG tablet Take 1 tablet (81 mg total) by mouth daily. Swallow whole.   atorvastatin 80 MG tablet Commonly known as: Lipitor Take 1 tablet (80 mg total) by mouth daily.   clopidogrel 75 MG tablet Commonly known as: PLAVIX Take 1 tablet (75 mg total) by mouth daily.   furosemide 40 MG tablet Commonly known as: LASIX Take 1 tablet (40 mg total) by mouth daily. For 4 days then stop   loratadine 10 MG tablet Commonly known as: CLARITIN Take 10 mg by mouth daily as needed for allergies.   metoprolol succinate 25 MG 24 hr tablet Commonly known as: TOPROL-XL Take 1 tablet (25 mg total) by mouth daily.   potassium chloride SA 20 MEQ tablet Commonly  known as: KLOR-CON M Take 1 tablet (20 mEq total) by mouth daily. For 4 days then stop.   traMADol 50 MG tablet Commonly known as: ULTRAM Take 1-2 tablets (50-100 mg total) by mouth every 6 (six) hours as needed for severe pain.               Durable Medical Equipment  (From admission, onward)           Start     Ordered   07/18/23 0856  For home use only DME Walker rolling  Once       Question Answer Comment  Walker: With 5 Inch Wheels   Patient needs a walker to treat with the following condition Imbalance      07/17/23 0856            Follow-up Information     Lightfoot, Eliezer Lofts, MD Follow up.   Specialty: Cardiothoracic Surgery Why: Appointment is via TELEPHONE;please do NOT come to the office. Dr. Cliffton Asters will call you on 07/22/2023 at 4:00 pm Contact information: [address] [place] [address] [phone]         Jonita Albee PA-C. Go on 08/03/2023.   Why: Appointment time is at 1:30 pm Contact information: [address] [place], [place] [address]   [phone]        Dale Fingal, MD. Call.   Specialty: Internal Medicine Why: for a follow up for one month regarding further surveillance of HGA1C 5.9 (pre diabetes) Contact information: [address] [place] [address] [phone]                 The patient has been discharged on:   1.Beta Blocker:  Yes [ x]                              No   [   ]                              If No, reason:  2.Ace Inhibitor/ARB: Yes [  ]                                     No  [  x  ]  If No, reason:Labile BP  3.Statin:   Yes [ x  ]                  No  [   ]                  If No, reason:  4.Ecasa:  Yes  [ x ]                  No   [   ]                  If No, reason:  5. ACS on Admission? No  P2Y12 Inhibitor:  Yes  [x ]  Small conduit for bypass grafting                                 No  [  ]    Signed: Ardelle Balls, PA-C 07/18/2023, 8:10 AM

## 2023-07-14 NOTE — Discharge Instructions (Signed)
Discharge Instructions:  1. You may shower, please wash incisions daily with soap and water and keep dry.  If you wish to cover wounds with dressing you may do so but please keep clean and change daily.  No tub baths or swimming until incisions have completely healed.  If your incisions become red or develop any drainage please call our office at [phone]  2. No Driving until cleared by Dr. Lightfoot's office and you are no longer using narcotic pain medications  3. Monitor your weight daily.. Please use the same scale and weigh at same time... If you gain 5-10 lbs in 48 hours with associated lower extremity swelling, please contact our office at [phone]  4. Fever of 101.5 for at least 24 hours with no source, please contact our office at [phone]  5. Activity- up as tolerated, please walk at least 3 times per day.  Avoid strenuous activity, no lifting, pushing, or pulling with your arms over 8-10 lbs for a minimum of 6 weeks  6. If any questions or concerns arise, please do not hesitate to contact our office at [phone] 

## 2023-07-15 ENCOUNTER — Inpatient Hospital Stay (HOSPITAL_COMMUNITY): Payer: 59

## 2023-07-15 DIAGNOSIS — D151 Benign neoplasm of heart: Secondary | ICD-10-CM | POA: Diagnosis not present

## 2023-07-15 DIAGNOSIS — I25119 Atherosclerotic heart disease of native coronary artery with unspecified angina pectoris: Secondary | ICD-10-CM | POA: Diagnosis not present

## 2023-07-15 DIAGNOSIS — Z951 Presence of aortocoronary bypass graft: Secondary | ICD-10-CM | POA: Diagnosis not present

## 2023-07-15 LAB — BASIC METABOLIC PANEL
Anion gap: 7 (ref 5–15)
BUN: 13 mg/dL (ref 8–23)
CO2: 26 mmol/L (ref 22–32)
Calcium: 8.3 mg/dL — ABNORMAL LOW (ref 8.9–10.3)
Chloride: 99 mmol/L (ref 98–111)
Creatinine, Ser: 0.82 mg/dL (ref 0.61–1.24)
GFR, Estimated: >60 mL/min (ref >60)
Glucose, Bld: 116 mg/dL — ABNORMAL HIGH (ref 70–99)
Potassium: 4 mmol/L (ref 3.5–5.1)
Sodium: 132 mmol/L — ABNORMAL LOW (ref 135–145)

## 2023-07-15 LAB — GLUCOSE, CAPILLARY
Glucose-Capillary: 103 mg/dL — ABNORMAL HIGH (ref 70–99)
Glucose-Capillary: 115 mg/dL — ABNORMAL HIGH (ref 70–99)
Glucose-Capillary: 113 mg/dL — ABNORMAL HIGH (ref 70–99)
Glucose-Capillary: 114 mg/dL — ABNORMAL HIGH (ref 70–99)
Glucose-Capillary: 127 mg/dL — ABNORMAL HIGH (ref 70–99)
Glucose-Capillary: 101 mg/dL — ABNORMAL HIGH (ref 70–99)

## 2023-07-15 LAB — CBC
HCT: 29.5 % — ABNORMAL LOW (ref 39.0–52.0)
Hemoglobin: 9.7 g/dL — ABNORMAL LOW (ref 13.0–17.0)
MCH: 29.6 pg (ref 26.0–34.0)
MCHC: 32.9 g/dL (ref 30.0–36.0)
MCV: 89.9 fL (ref 80.0–100.0)
Platelets: 169 10*3/uL (ref 150–400)
RBC: 3.28 MIL/uL — ABNORMAL LOW (ref 4.22–5.81)
RDW: 14 % (ref 11.5–15.5)
WBC: 14.9 10*3/uL — ABNORMAL HIGH (ref 4.0–10.5)
nRBC: 0 % (ref 0.0–0.2)

## 2023-07-15 MED ORDER — FUROSEMIDE 40 MG PO TABS
40.0000 mg | ORAL_TABLET | Freq: Every day | ORAL | Status: DC
  Administered 2023-07-15 – 2023-07-18 (×4): 40 mg via ORAL
  Filled 2023-07-15 (×3): qty 1

## 2023-07-15 MED ORDER — SODIUM CHLORIDE 0.9 % IV SOLN: 250.0000 mL | INTRAVENOUS | Status: DC | PRN

## 2023-07-15 MED ORDER — SODIUM CHLORIDE 0.9% FLUSH: 3.0000 mL | INTRAVENOUS | Status: DC | PRN

## 2023-07-15 MED ORDER — SODIUM CHLORIDE 0.9% FLUSH
3.0000 mL | Freq: Two times a day (BID) | INTRAVENOUS | Status: DC
  Administered 2023-07-15 – 2023-07-16 (×3): 3 mL via INTRAVENOUS

## 2023-07-15 MED ORDER — ~~LOC~~ CARDIAC SURGERY, PATIENT & FAMILY EDUCATION: Freq: Once | Status: AC
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
## 2023-07-15 NOTE — Progress Notes (Signed)
      [address]       [place] [address]             [phone]                 2 Days Post-Op Procedure(s) (LRB): CORONARY ARTERY BYPASS GRAFTING (CABG) x 3 USING LEFT INTERNAL MAMMARY ARTERY (LIMA) AND ENDOSCOPICALLY HARVESTED LEFT GREATER SAPHENOUS VEIN (N/A) LEFT ATRIAL MYXOMA RESECTION (N/A) TRANSESOPHAGEAL ECHOCARDIOGRAM (N/A) STERNAL CYST REMOVAL (N/A)   Events: No events _______________________________________________________________ Vitals: BP 136/86   Pulse 90   Temp 97.6 F (36.4 C) (Oral)   Resp 17   Ht 5\' 6"  (1.676 m)   Wt 79.9 kg   SpO2 94%   BMI 28.42 kg/m  Filed Weights   07/13/23 0639 07/14/23 0500 07/15/23 0459  Weight: 75.8 kg 79.7 kg 79.9 kg     - Neuro: alert NAD  - Cardiovascular: Sinus  Drips: none.   CVP:  [16 mmHg-21 mmHg] 21 mmHg  - Pulm: EWOB   ABG    Component Value Date/Time   PHART 7.355 07/13/2023 2205   PCO2ART 37.6 07/13/2023 2205   PO2ART 148 (H) 07/13/2023 2205   HCO3 21.1 07/13/2023 2205   TCO2 22 07/13/2023 2205   ACIDBASEDEF 4.0 (H) 07/13/2023 2205   O2SAT 99 07/13/2023 2205    - Abd: ND - Extremity: warm  .Intake/Output      07/18 0701 07/19 0700 07/19 0701 07/20 0700   P.O.  60   I.V. (mL/kg) 318.4 (4) 20 (0.3)   Blood     IV Piggyback 310.1 0   Total Intake(mL/kg) 628.4 (7.9) 80 (1)   Urine (mL/kg/hr) 355 (0.2) 30 (0.1)   Blood     Chest Tube 350 30   Total Output 705 60   Net -76.6 +20        Urine Occurrence 3 x 1 x      _______________________________________________________________ Labs:    Latest Ref Rng & Units 07/14/2023    5:06 PM 07/14/2023    4:31 AM 07/13/2023   10:05 PM  CBC  WBC 4.0 - 10.5 K/uL 18.1  11.8    Hemoglobin 13.0 - 17.0 g/dL 95.6  38.7  56.4   Hematocrit 39.0 - 52.0 % 32.7  30.6  30.0   Platelets 150 - 400 K/uL 193  152        Latest Ref Rng & Units 07/14/2023    5:06 PM 07/14/2023    4:31 AM 07/13/2023   10:05 PM  CMP  Glucose 70 - 99 mg/dL  332  951    BUN 8 - 23 mg/dL 16  16    Creatinine 8.84 - 1.24 mg/dL 1.66  0.63    Sodium 016 - 145 mmol/L 131  133  139   Potassium 3.5 - 5.1 mmol/L 4.1  4.2  5.0   Chloride 98 - 111 mmol/L 100  103    CO2 22 - 32 mmol/L 22  20    Calcium 8.9 - 10.3 mg/dL 8.1  7.7      CXR: PV congestion  _______________________________________________________________  Assessment and Plan: POD 2 s/p CABG, myxoma resection  Neuro: adjusting pain meds CV: on A/S/BB.  On plavix today for poor conduit Pulm: IS, ambulaton Renal: creat stable GI: on diet Heme: stable ID: [email or web address] Endo: SSI  Dispo: floor   Harve Spradley O Breigh Annett 07/15/2023 9:44 AM

## 2023-07-16 DIAGNOSIS — E871 Hypo-osmolality and hyponatremia: Secondary | ICD-10-CM | POA: Diagnosis not present

## 2023-07-16 DIAGNOSIS — Z951 Presence of aortocoronary bypass graft: Secondary | ICD-10-CM | POA: Diagnosis not present

## 2023-07-16 LAB — BASIC METABOLIC PANEL
Anion gap: 12 (ref 5–15)
BUN: 12 mg/dL (ref 8–23)
CO2: 26 mmol/L (ref 22–32)
Calcium: 8.4 mg/dL — ABNORMAL LOW (ref 8.9–10.3)
Chloride: 97 mmol/L — ABNORMAL LOW (ref 98–111)
Creatinine, Ser: 0.9 mg/dL (ref 0.61–1.24)
GFR, Estimated: >60 mL/min (ref >60)
Glucose, Bld: 96 mg/dL (ref 70–99)
Potassium: 4 mmol/L (ref 3.5–5.1)
Sodium: 135 mmol/L (ref 135–145)

## 2023-07-16 LAB — CBC
HCT: 29.1 % — ABNORMAL LOW (ref 39.0–52.0)
Hemoglobin: 9.8 g/dL — ABNORMAL LOW (ref 13.0–17.0)
MCH: 30.2 pg (ref 26.0–34.0)
MCHC: 33.7 g/dL (ref 30.0–36.0)
MCV: 89.5 fL (ref 80.0–100.0)
Platelets: 162 10*3/uL (ref 150–400)
RBC: 3.25 MIL/uL — ABNORMAL LOW (ref 4.22–5.81)
RDW: 14.1 % (ref 11.5–15.5)
WBC: 12 10*3/uL — ABNORMAL HIGH (ref 4.0–10.5)
nRBC: 0 % (ref 0.0–0.2)

## 2023-07-16 LAB — GLUCOSE, CAPILLARY
Glucose-Capillary: 98 mg/dL (ref 70–99)
Glucose-Capillary: 134 mg/dL — ABNORMAL HIGH (ref 70–99)

## 2023-07-16 MED ORDER — SODIUM CHLORIDE 0.9 % IV SOLN: 250.0000 mL | INTRAVENOUS | Status: DC | PRN

## 2023-07-16 MED ORDER — ~~LOC~~ CARDIAC SURGERY, PATIENT & FAMILY EDUCATION: Freq: Once | Status: AC

## 2023-07-16 MED ORDER — SODIUM CHLORIDE 0.9% FLUSH: 3.0000 mL | INTRAVENOUS | Status: DC | PRN

## 2023-07-16 MED ORDER — METOPROLOL SUCCINATE ER 25 MG PO TB24
25.0000 mg | ORAL_TABLET | Freq: Every day | ORAL | Status: DC
  Administered 2023-07-16 – 2023-07-18 (×3): 25 mg via ORAL
  Filled 2023-07-16 (×3): qty 1

## 2023-07-16 MED ORDER — SODIUM CHLORIDE 0.9% FLUSH
3.0000 mL | Freq: Two times a day (BID) | INTRAVENOUS | Status: DC
  Administered 2023-07-16: 3 mL via INTRAVENOUS

## 2023-07-16 NOTE — Progress Notes (Signed)
NAME:  Thomas Harper, MRN:  [id number], DOB:  03-14-1961, LOS: 3 ADMISSION DATE:  07/13/2023, CONSULTATION DATE:  07/13/23 REFERRING MD:  Cliffton Asters, CHIEF COMPLAINT:  SOB   History of Present Illness:  62 year old man w/ hx of HLD, HTN who presented with exertion SOB found as OP to have 3V CAD as well as atrial myxoma.  Today underwent CABG x 3 and atrial myxoma removal pump time 169, crossclamp 125.  Issues included saphenous vein harvest trouble as well as a small LIMA graft.  Arrives to unit intubated/sedated.  Pertinent  Medical History  CAD GERD HLD HTN  Significant Hospital Events: Including procedures, antibiotic start and stop dates in addition to other pertinent events   7/17 CABG x 3 (LIMA-LAD, GSV- ??), atrial myxoma removal 7/18 extubated, no pressors or inotropes 7/19 stable and progressing   Interim History / Subjective:  Ambulating in the hallway No overnight issues Remain afebrile  Objective   Blood pressure (!) 129/91, pulse 92, temperature 98.4 F (36.9 C), temperature source Oral, resp. rate 18, height 5\' 6"  (1.676 m), weight 77.8 kg, SpO2 94%.        Intake/Output Summary (Last 24 hours) at 07/16/2023 1157 Last data filed at 07/16/2023 0800 Gross per 24 hour  Intake 212.87 ml  Output 925 ml  Net -712.13 ml   Filed Weights   07/14/23 0500 07/15/23 0459 07/16/23 0500  Weight: 79.7 kg 79.9 kg 77.8 kg   Physical exam: General: Middle aged male, lying on the bed HEENT: [place]/AT, eyes anicteric.  moist mucus membranes Neuro: Alert, awake following commands Chest: Coarse breath sounds, no wheezes or rhonchi Heart: Regular rate and rhythm, no murmurs or gallops Abdomen: Soft, nontender, nondistended, bowel sounds present Skin: No rash   Labs Glu 96 Na  135 Hgb 9.8  Resolved Hospital Problem list   Postop vent management  Assessment & Plan:  CAD post CABG  Atrial myxoma s/p resection Expected ABLA postop Hyponatremia  Continue aspirin, Plavix  and statin Continue Toprol Chest tube and wires are out Art line was discontinued Continue gentle diuresis Monitor H&H and electrolytes Continue multimodal pain meds   Best Practice (right click and "Reselect all SmartList Selections" daily)   Diet/type: Regular consistency (see orders) DVT prophylaxis: not indicated GI prophylaxis: PPI Lines: N/A Foley: N/A Code Status:  full code Last date of multidisciplinary goals of care discussion [per primary, improving]  Labs   CBC: Recent Labs  Lab 07/13/23 2203 07/13/23 2205 07/14/23 0431 07/14/23 1706 07/15/23 0500 07/16/23 0206  WBC 11.1*  --  11.8* 18.1* 14.9* 12.0*  HGB 10.5* 10.2* 10.1* 10.8* 9.7* 9.8*  HCT 31.8* 30.0* 30.6* 32.7* 29.5* 29.1*  MCV 90.9  --  89.7 89.3 89.9 89.5  PLT 139*  --  152 193 169 162    Basic Metabolic Panel: Recent Labs  Lab 07/13/23 2203 07/13/23 2205 07/14/23 0431 07/14/23 1706 07/15/23 0500 07/16/23 0206  NA 136 139 133* 131* 132* 135  K 4.3 5.0 4.2 4.1 4.0 4.0  CL 106  --  103 100 99 97*  CO2 20*  --  20* 22 26 26   GLUCOSE 150*  --  175* 158* 116* 96  BUN 15  --  16 16 13 12   CREATININE 1.05  --  0.95 0.97 0.82 0.90  CALCIUM 7.8*  --  7.7* 8.1* 8.3* 8.4*  MG 3.0*  --  2.4 2.4  --   --    GFR: Estimated Creatinine Clearance:  83.5 mL/min (by C-G formula based on SCr of 0.9 mg/dL). Recent Labs  Lab 07/14/23 0431 07/14/23 1706 07/15/23 0500 07/16/23 0206  WBC 11.8* 18.1* 14.9* 12.0*    Liver Function Tests: Recent Labs  Lab 07/11/23 1423 07/13/23 2203  AST 23 69*  ALT 37 26  ALKPHOS 70 40  BILITOT 0.5 1.5*  PROT 7.1 5.2*  ALBUMIN 4.0 3.8   No results for input(s): "LIPASE", "AMYLASE" in the last 168 hours. No results for input(s): "AMMONIA" in the last 168 hours.  ABG    Component Value Date/Time   PHART 7.355 07/13/2023 2205   PCO2ART 37.6 07/13/2023 2205   PO2ART 148 (H) 07/13/2023 2205   HCO3 21.1 07/13/2023 2205   TCO2 22 07/13/2023 2205   ACIDBASEDEF  4.0 (H) 07/13/2023 2205   O2SAT 99 07/13/2023 2205     Coagulation Profile: Recent Labs  Lab 07/11/23 1423 07/13/23 1552  INR 1.0 1.4*    Cardiac Enzymes: No results for input(s): "CKTOTAL", "CKMB", "CKMBINDEX", "TROPONINI" in the last 168 hours.  HbA1C: Hgb A1c MFr Bld  Date/Time Value Ref Range Status  07/11/2023 02:23 PM 5.9 (H) 4.8 - 5.6 % Final    Comment:    (NOTE) Pre diabetes:          5.7%-6.4%  Diabetes:              >6.4%  Glycemic control for   <7.0% adults with diabetes   03/18/2023 08:21 AM 6.2 4.6 - 6.5 % Final    Comment:    Glycemic Control Guidelines for People with Diabetes:Non Diabetic:  <6%Goal of Therapy: <7%Additional Action Suggested:  >8%     CBG: Recent Labs  Lab 07/15/23 1631 07/15/23 1958 07/15/23 2323 07/16/23 0406 07/16/23 0734  GLUCAP 114* 127* 101* 98 134*     Cheri Fowler, MD [facility] See Amion for pager If no response to pager, please call [phone] until 7pm After 7pm, Please call E-link [phone]

## 2023-07-16 NOTE — Progress Notes (Signed)
      [address]       [place] [address]             [phone]      3 Days Post-Op  Procedure(s) (LRB): CORONARY ARTERY BYPASS GRAFTING (CABG) x 3 USING LEFT INTERNAL MAMMARY ARTERY (LIMA) AND ENDOSCOPICALLY HARVESTED LEFT GREATER SAPHENOUS VEIN (N/A) LEFT ATRIAL MYXOMA RESECTION (N/A) TRANSESOPHAGEAL ECHOCARDIOGRAM (N/A) STERNAL CYST REMOVAL (N/A)   Total Length of Stay:  LOS: 3 days    SUBJECTIVE: Ambulated full lap this am Eating well  Vitals:   07/16/23 0700 07/16/23 0731  BP: 122/73   Pulse: 93   Resp: 14   Temp:  98 F (36.7 C)  SpO2: 95%     Intake/Output      07/19 0701 07/20 0700 07/20 0701 07/21 0700   P.O. 120    I.V. (mL/kg) 20 (0.3)    IV Piggyback 89.9    Total Intake(mL/kg) 229.9 (3)    Urine (mL/kg/hr) 955 (0.5)    Stool 0    Chest Tube 30    Total Output 985    Net -755.1         Urine Occurrence 4 x    Stool Occurrence 1 x        sodium chloride Stopped (07/14/23 2249)   sodium chloride     sodium chloride     sodium chloride     albumin human 999 mL/hr at 07/15/23 0800   lactated ringers Stopped (07/13/23 1658)   lactated ringers Stopped (07/15/23 1518)    CBC    Component Value Date/Time   WBC 12.0 (H) 07/16/2023 0206   RBC 3.25 (L) 07/16/2023 0206   HGB 9.8 (L) 07/16/2023 0206   HCT 29.1 (L) 07/16/2023 0206   PLT 162 07/16/2023 0206   MCV 89.5 07/16/2023 0206   MCH 30.2 07/16/2023 0206   MCHC 33.7 07/16/2023 0206   RDW 14.1 07/16/2023 0206   LYMPHSABS 1.4 05/13/2023 1214   MONOABS 0.6 05/13/2023 1214   EOSABS 0.2 05/13/2023 1214   BASOSABS 0.1 05/13/2023 1214   CMP     Component Value Date/Time   NA 135 07/16/2023 0206   K 4.0 07/16/2023 0206   CL 97 (L) 07/16/2023 0206   CO2 26 07/16/2023 0206   GLUCOSE 96 07/16/2023 0206   BUN 12 07/16/2023 0206   CREATININE 0.90 07/16/2023 0206   CALCIUM 8.4 (L) 07/16/2023 0206   PROT 5.2 (L) 07/13/2023 2203   ALBUMIN 3.8 07/13/2023 2203   AST 69  (H) 07/13/2023 2203   ALT 26 07/13/2023 2203   ALKPHOS 40 07/13/2023 2203   BILITOT 1.5 (H) 07/13/2023 2203   GFRNONAA >60 07/16/2023 0206   GFRAA >60 11/04/2018 1539   ABG    Component Value Date/Time   PHART 7.355 07/13/2023 2205   PCO2ART 37.6 07/13/2023 2205   PO2ART 148 (H) 07/13/2023 2205   HCO3 21.1 07/13/2023 2205   TCO2 22 07/13/2023 2205   ACIDBASEDEF 4.0 (H) 07/13/2023 2205   O2SAT 99 07/13/2023 2205   CBG (last 3)  Recent Labs    07/15/23 2323 07/16/23 0406 07/16/23 0734  GLUCAP 101* 98 134*  EXAM Lungs: clear Card: RR Ext: warm Neuro: alert and no focal deficits   ASSESSMENT: POD #3 SP CABG Doing well Will transfer to floor Change bb to Toprol xl daily Continue diuresis   Eugenio Hoes, MD 07/16/2023

## 2023-07-16 NOTE — Anesthesia Postprocedure Evaluation (Signed)
Anesthesia Post Note  Patient: Thomas Harper  Procedure(s) Performed: CORONARY ARTERY BYPASS GRAFTING (CABG) x 3 USING LEFT INTERNAL MAMMARY ARTERY (LIMA) AND ENDOSCOPICALLY HARVESTED LEFT GREATER SAPHENOUS VEIN (Chest) LEFT ATRIAL MYXOMA RESECTION TRANSESOPHAGEAL ECHOCARDIOGRAM STERNAL CYST REMOVAL     Patient location during evaluation: SICU Anesthesia Type: General Level of consciousness: sedated Pain management: pain level controlled Vital Signs Assessment: post-procedure vital signs reviewed and stable Respiratory status: patient remains intubated per anesthesia plan Cardiovascular status: stable Postop Assessment: no apparent nausea or vomiting Anesthetic complications: no   No notable events documented.              Timea Breed

## 2023-07-16 NOTE — Progress Notes (Signed)
CARDIAC REHAB PHASE I     Pt sitting in chair, feeling well today. Pt ambulated in hall once today. Reports tolerating well with no dizziness and mild fatigue towards end of walk. Encouraged 3 walks per day and IS use. Pos OHS including site care, restrictions, risk factors, heart healthy diet, exercise guidelines, OHS booklet, home needs at discharge, IS use at home, sternal precautions and CPR 2 reviewed with pt and wife. All questions and concerns addressed. Will send referral to [facility] for CRP2. Will continue to follow.   [id number] Woodroe Chen, RN BSN 07/16/2023 9:49 AM

## 2023-07-16 NOTE — Progress Notes (Signed)
Tx pt to 4E19 via wheelchair w/ no issues. Pt on RA and able to walk to bed w/ no complications. RN at bedside at report given.

## 2023-07-17 ENCOUNTER — Inpatient Hospital Stay (HOSPITAL_COMMUNITY): Payer: 59

## 2023-07-17 LAB — BASIC METABOLIC PANEL
Anion gap: 8 (ref 5–15)
BUN: 14 mg/dL (ref 8–23)
CO2: 27 mmol/L (ref 22–32)
Calcium: 8.1 mg/dL — ABNORMAL LOW (ref 8.9–10.3)
Chloride: 99 mmol/L (ref 98–111)
Creatinine, Ser: 0.83 mg/dL (ref 0.61–1.24)
GFR, Estimated: >60 mL/min (ref >60)
Glucose, Bld: 98 mg/dL (ref 70–99)
Potassium: 3.4 mmol/L — ABNORMAL LOW (ref 3.5–5.1)
Sodium: 134 mmol/L — ABNORMAL LOW (ref 135–145)

## 2023-07-17 LAB — CBC
HCT: 28 % — ABNORMAL LOW (ref 39.0–52.0)
Hemoglobin: 9.2 g/dL — ABNORMAL LOW (ref 13.0–17.0)
MCH: 29 pg (ref 26.0–34.0)
MCHC: 32.9 g/dL (ref 30.0–36.0)
MCV: 88.3 fL (ref 80.0–100.0)
Platelets: 215 10*3/uL (ref 150–400)
RBC: 3.17 MIL/uL — ABNORMAL LOW (ref 4.22–5.81)
RDW: 14.1 % (ref 11.5–15.5)
WBC: 8.2 10*3/uL (ref 4.0–10.5)
nRBC: 0 % (ref 0.0–0.2)

## 2023-07-17 MED ORDER — POTASSIUM CHLORIDE CRYS ER 20 MEQ PO TBCR
20.0000 meq | EXTENDED_RELEASE_TABLET | Freq: Three times a day (TID) | ORAL | Status: AC
  Administered 2023-07-17 – 2023-07-18 (×3): 20 meq via ORAL
  Filled 2023-07-17 (×3): qty 1

## 2023-07-17 NOTE — Evaluation (Signed)
Physical Therapy Evaluation Patient Details Name: Thomas Harper MRN: [id number] DOB: 1961-04-19 Today's Date: 07/17/2023  History of Present Illness  Pt is a 62 y.o. male who presented 07/13/23 for CABG x3 and atrial myxoma removal, extubated same day. PMH: CAD, GERD, HTN   Clinical Impression  Pt presents with condition above and deficits mentioned below, see PT Problem List. PTA, he was independent without DME, working as a Nutritional therapist, and living with his wife in a 1-level house with 5 STE. Pt reports chronic R leg issues. He demonstrates deficits in bil lower extremity strength and balance when navigating stairs. He relies on the handrails for stability and navigates stairs slowly with a step-to pattern and min guard assist at this time. Pt is capable of ambulating without a RW within the room but prefers the RW for longer distances, appearing to assist him in energy conservation. Recommending a rollator at d/c for this purpose. Pt will likely progress well with continued mobility, thus no PT follow-up is anticipated to be needed at d/c. Educated pt on his sternal precautions, exercises to improve his strength, edema management, and progressing his endurance. Will continue to follow acutely to maximize his safety and independence with functional mobility, specifically stairs, before d/c home.      Assistance Recommended at Discharge PRN  If plan is discharge home, recommend the following:  Can travel by private vehicle  Assistance with cooking/housework;Help with stairs or ramp for entrance        Equipment Recommendations Rollator (4 wheels)  Recommendations for Other Services       Functional Status Assessment Patient has had a recent decline in their functional status and demonstrates the ability to make significant improvements in function in a reasonable and predictable amount of time.     Precautions / Restrictions Precautions Precautions: Sternal Precaution Booklet Issued: Yes  (comment) Precaution Comments: reviewed precautions Restrictions Weight Bearing Restrictions: Yes Other Position/Activity Restrictions: sternal precautions      Mobility  Bed Mobility               General bed mobility comments: Pt sitting in recliner upon arrival and at end of session    Transfers Overall transfer level: Independent Equipment used: None               General transfer comment: Able to transfer sit <> stand multiple times from recliner without use of UEs, no LOB    Ambulation/Gait Ambulation/Gait assistance: Supervision Gait Distance (Feet): 400 Feet Assistive device: None, Rolling walker (2 wheels) Gait Pattern/deviations: Step-through pattern, Decreased stride length Gait velocity: reduced Gait velocity interpretation: >2.62 ft/sec, indicative of community ambulatory   General Gait Details: Pt ambulates short distances in the room without AD and without overt LOB. Prefers to utilize RW for longer distances, no LOB. Min guard for safety and cues provided for improved feet clearance and proximity to RW.  Stairs Stairs: Yes Stairs assistance: Min guard Stair Management: One rail Right, Two rails, Step to pattern, Forwards Number of Stairs: 12 General stair comments: Ascends with R rail and step-to pattern with tendency to lead up with his R but able to lead up with his L when cued. x1 posterior sway on 2nd step but improved stability as he progressed up the stairs, min guard for safety. Descends with bil handrail support or L rail and R HHA, no LOB, min guard for safety. Educated pt on leading up with his "good" (L) leg and down with his "bad (R) leg.  Wheelchair Mobility     Tilt Bed    Modified Rankin (Stroke Patients Only)       Balance Overall balance assessment: Mild deficits observed, not formally tested                                           Pertinent Vitals/Pain Pain Assessment Pain Assessment: Faces Faces  Pain Scale: Hurts little more Pain Location: R leg (chronic), sternum, posterior legs with stairs Pain Descriptors / Indicators: Discomfort, Grimacing, Guarding, Sore Pain Intervention(s): Limited activity within patient's tolerance, Monitored during session, Repositioned    Home Living Family/patient expects to be discharged to:: Private residence Living Arrangements: Spouse/significant other Available Help at Discharge: Family Type of Home: House Home Access: Stairs to enter Entrance Stairs-Rails: Can reach both Entrance Stairs-Number of Steps: 5   Home Layout: One level Home Equipment: Shower seat - built in      Prior Function Prior Level of Function : Independent/Modified Independent;Driving;Working/employed               ADLs Comments: Works as a Merchandiser, retail Extremity Assessment Upper Extremity Assessment: Overall WFL for tasks assessed (sternal precautions)    Lower Extremity Assessment Lower Extremity Assessment: Generalized weakness (noted with functional mobility, MMT scores of 4+ to 5 grossly bil; chronic R leg pain; bil lower leg incisions from surgery)    Cervical / Trunk Assessment Cervical / Trunk Assessment: Other exceptions (sternal precautions)  Communication   Communication: No difficulties  Cognition Arousal/Alertness: Awake/alert Behavior During Therapy: WFL for tasks assessed/performed Overall Cognitive Status: Within Functional Limits for tasks assessed                                          General Comments General comments (skin integrity, edema, etc.): educated pt on exercises to improve LE strength, like sit <> stand with eccentric control and LAQ; educated on elevating legs and performing ankle pumps at rest to manage edema; provided HEP s/p cardiac surgery handout to pt; HR up to 100s; pt and wife verbalized understanding of all education    Exercises      Assessment/Plan    PT Assessment Patient needs continued PT services  PT Problem List Decreased strength;Decreased activity tolerance;Decreased balance;Decreased mobility       PT Treatment Interventions DME instruction;Gait training;Stair training;Functional mobility training;Therapeutic activities;Therapeutic exercise;Balance training;Neuromuscular re-education;Patient/family education    PT Goals (Current goals can be found in the Care Plan section)  Acute Rehab PT Goals Patient Stated Goal: to improve PT Goal Formulation: With patient/family Time For Goal Achievement: 07/31/23 Potential to Achieve Goals: Good    Frequency Min 1X/week     Co-evaluation               AM-PAC PT "6 Clicks" Mobility  Outcome Measure Help needed turning from your back to your side while in a flat bed without using bedrails?: None Help needed moving from lying on your back to sitting on the side of a flat bed without using bedrails?: None Help needed moving to and from a bed to a chair (including a wheelchair)?: None Help needed standing up from a chair using your arms (e.g., wheelchair or bedside  chair)?: None Help needed to walk in hospital room?: A Little Help needed climbing 3-5 steps with a railing? : A Little 6 Click Score: 22    End of Session Equipment Utilized During Treatment: Gait belt Activity Tolerance: Patient tolerated treatment well Patient left: in chair;with call bell/phone within reach;with family/visitor present   PT Visit Diagnosis: Unsteadiness on feet (R26.81);Other abnormalities of gait and mobility (R26.89);Muscle weakness (generalized) (M62.81);Difficulty in walking, not elsewhere classified (R26.2)    Time: 8657-8469 PT Time Calculation (min) (ACUTE ONLY): 23 min   Charges:   PT Evaluation $PT Eval Moderate Complexity: 1 Mod PT Treatments $Therapeutic Activity: 8-22 mins PT General Charges $$ ACUTE PT VISIT: 1 Visit         Raymond Gurney, PT,  DPT Acute Rehabilitation Services  Office: [phone]   Jewel Baize 07/17/2023, 11:00 AM

## 2023-07-17 NOTE — Progress Notes (Signed)
      [address]       [place] [address]             [phone]      4 Days Post-Op Procedure(s) (LRB): CORONARY ARTERY BYPASS GRAFTING (CABG) x 3 USING LEFT INTERNAL MAMMARY ARTERY (LIMA) AND ENDOSCOPICALLY HARVESTED LEFT GREATER SAPHENOUS VEIN (N/A) LEFT ATRIAL MYXOMA RESECTION (N/A) TRANSESOPHAGEAL ECHOCARDIOGRAM (N/A) STERNAL CYST REMOVAL (N/A) Subjective: Awake and alert, says pain is controlled.  Walked in the hall this morning.  On RA BM yesterday  Objective: Vital signs in last 24 hours: Temp:  [98 F (36.7 C)-98.4 F (36.9 C)] 98.2 F (36.8 C) (07/21 0530) Pulse Rate:  [88-100] 89 (07/20 2000) Cardiac Rhythm: Normal sinus rhythm (07/21 0751) Resp:  [8-27] 22 (07/20 1900) BP: (101-138)/(70-91) 133/74 (07/21 0530) SpO2:  [92 %-98 %] 92 % (07/20 2000) Weight:  [77.1 kg] 77.1 kg (07/21 0530)     Intake/Output from previous day: 07/20 0701 - 07/21 0700 In: 63 [I.V.:63] Out: 750 [Urine:750] Intake/Output this shift: No intake/output data recorded.  General appearance: alert, cooperative, and no distress Neurologic: intact Heart: RRR, no arrhythmias Lungs: normal WOB, breath sounds clear, stable sats on  RA Extremities: moderate LE edema Wound: the sternotomy and EVH incisions are intact and dry  Lab Results: Recent Labs    07/16/23 0206 07/17/23 0712  WBC 12.0* 8.2  HGB 9.8* 9.2*  HCT 29.1* 28.0*  PLT 162 215   BMET:  Recent Labs    07/16/23 0206 07/17/23 0712  NA 135 134*  K 4.0 3.4*  CL 97* 99  CO2 26 27  GLUCOSE 96 98  BUN 12 14  CREATININE 0.90 0.83  CALCIUM 8.4* 8.1*    PT/INR: No results for input(s): "LABPROT", "INR" in the last 72 hours. ABG    Component Value Date/Time   PHART 7.355 07/13/2023 2205   HCO3 21.1 07/13/2023 2205   TCO2 22 07/13/2023 2205   ACIDBASEDEF 4.0 (H) 07/13/2023 2205   O2SAT 99 07/13/2023 2205   CBG (last 3)  Recent Labs    07/15/23 2323 07/16/23 0406 07/16/23 0734  GLUCAP  101* 98 134*    Assessment/Plan: S/P Procedure(s) (LRB): CORONARY ARTERY BYPASS GRAFTING (CABG) x 3 USING LEFT INTERNAL MAMMARY ARTERY (LIMA) AND ENDOSCOPICALLY HARVESTED LEFT GREATER SAPHENOUS VEIN (N/A) LEFT ATRIAL MYXOMA RESECTION (N/A) TRANSESOPHAGEAL ECHOCARDIOGRAM (N/A) STERNAL CYST REMOVAL (N/A)  -POD4 CABG x 3 and resection of left atrial myxoma. Stable VS and cardiac rhythm. On ASA, Lipitor, metoprolol and Plavix (small conduit). Progressing with mobility.  -HEME- expected ABL anemia. Hct stable  -PULM- no dyspnea on RA. CXR showing clear lung fields, minimal effusions.  -GI- tolerating PO's, BM yesterday.   -RENAL-normal function, Wt 2kg+, continue diuresis, correct K+.  -Disposition- Planning for discharge to home tomorrow. Wants to work with PT on steps before he leaves. Will request a rolling walker for home use per his request.    LOS: 4 days    Leary Roca, PA-C 07/17/2023

## 2023-07-18 ENCOUNTER — Other Ambulatory Visit: Payer: Self-pay | Admitting: *Deleted

## 2023-07-18 ENCOUNTER — Telehealth: Payer: Self-pay

## 2023-07-18 DIAGNOSIS — Z951 Presence of aortocoronary bypass graft: Secondary | ICD-10-CM

## 2023-07-18 LAB — BASIC METABOLIC PANEL
Anion gap: 11 (ref 5–15)
BUN: 17 mg/dL (ref 8–23)
CO2: 22 mmol/L (ref 22–32)
Calcium: 8.3 mg/dL — ABNORMAL LOW (ref 8.9–10.3)
Chloride: 101 mmol/L (ref 98–111)
Creatinine, Ser: 0.92 mg/dL (ref 0.61–1.24)
GFR, Estimated: >60 mL/min (ref >60)
Glucose, Bld: 95 mg/dL (ref 70–99)
Potassium: 4.4 mmol/L (ref 3.5–5.1)
Sodium: 134 mmol/L — ABNORMAL LOW (ref 135–145)

## 2023-07-18 MED ORDER — CLOPIDOGREL BISULFATE 75 MG PO TABS: 75.0000 mg | ORAL_TABLET | Freq: Every day | ORAL | 1 refills | Status: DC

## 2023-07-18 MED ORDER — POTASSIUM CHLORIDE CRYS ER 20 MEQ PO TBCR
20.0000 meq | EXTENDED_RELEASE_TABLET | Freq: Every day | ORAL | Status: DC
  Filled 2023-07-18: qty 1

## 2023-07-18 MED ORDER — FUROSEMIDE 40 MG PO TABS: 40.0000 mg | ORAL_TABLET | Freq: Every day | ORAL | 0 refills | Status: DC

## 2023-07-18 MED ORDER — POTASSIUM CHLORIDE CRYS ER 20 MEQ PO TBCR: 20.0000 meq | EXTENDED_RELEASE_TABLET | Freq: Every day | ORAL | 0 refills | Status: DC

## 2023-07-18 MED ORDER — TRAMADOL HCL 50 MG PO TABS: 50.0000 mg | ORAL_TABLET | Freq: Four times a day (QID) | ORAL | 0 refills | Status: DC | PRN

## 2023-07-18 MED ORDER — METOPROLOL SUCCINATE ER 25 MG PO TB24: 25.0000 mg | ORAL_TABLET | Freq: Every day | ORAL | 1 refills | Status: DC

## 2023-07-18 MED FILL — Lidocaine HCl Local Preservative Free (PF) Inj 2%: INTRAMUSCULAR | Qty: 14 | Status: AC

## 2023-07-18 MED FILL — Heparin Sodium (Porcine) Inj 1000 Unit/ML: Qty: 1000 | Status: AC

## 2023-07-18 MED FILL — Tranexamic Acid IV Soln 1000 MG/10ML (100 MG/ML): INTRAVENOUS | Qty: 3000 | Status: AC

## 2023-07-18 MED FILL — Potassium Chloride Inj 2 mEq/ML: INTRAVENOUS | Qty: 40 | Status: AC

## 2023-07-18 NOTE — Plan of Care (Addendum)
Discharge instructions discussed with patient.  Patient instructed on home medications, restrictions, and follow up appointments. Belongings gathered and sent with patient.  Patients medications sent to [facility]   Patient states that the current Rollator does not work properly and requesting a new one prior to d/c. CM and bedside RN aware.

## 2023-07-18 NOTE — Telephone Encounter (Signed)
STD /Attending MD Statement completed and faxed to [phone] [employer]  Beginning LOA 07/13/23 through 10/10/23./ form at front desk for pk-up

## 2023-07-18 NOTE — Progress Notes (Signed)
CARDIAC REHAB PHASE I   Pt ready for discharge home.  All educational needs met. No questions or concerns at this time. Referral sent to [facility] for CRP2.    Woodroe Chen, RN BSN 07/18/2023 10:49 AM

## 2023-07-18 NOTE — TOC Initial Note (Signed)
Transition of Care [facility]) - Initial/Assessment Note    Patient Details  Name: Thomas Harper MRN: [id number] Date of Birth: [birth year]  Transition of [facility]) CM/SW Contact:    Leone Haven, RN Phone Number: 07/18/2023, 10:21 AM  Clinical Narrative:                 Patient is from home with wife, indep.  He has PCP and has a follow up coming up but states he will need to reschedule it for a later time.  He states he will need a rollator and has no preference for the agency.  NCM made referral to [facility] with [employer] for the rollator, this will be delivered to the room prior to dc.  Wife is at the bedside and will transport him home, she is his support system.    Expected Discharge Plan: Home/Self Care Barriers to Discharge: No Barriers Identified   Patient Goals and CMS Choice Patient states their goals for this hospitalization and ongoing recovery are:: return home   Choice offered to / list presented to : NA      Expected Discharge Plan and Services In-house Referral: NA Discharge Planning Services: CM Consult Post Acute Care Choice: NA Living arrangements for the past 2 months: Single Family Home Expected Discharge Date: 07/18/23               DME Arranged: Dan Humphreys rolling with seat DME Agency: [employer] Date DME Agency Contacted: 07/18/23 Time DME Agency Contacted: 1019 Representative spoke with at DME Agency: Vonna Kotyk HH Arranged: NA          Prior Living Arrangements/Services Living arrangements for the past 2 months: Single Family Home Lives with:: Spouse Patient language and need for interpreter reviewed:: Yes Do you feel safe going back to the place where you live?: Yes      Need for Family Participation in Patient Care: Yes (Comment) Care giver support system in place?: Yes (comment)   Criminal Activity/Legal Involvement Pertinent to Current Situation/Hospitalization: No - Comment as needed  Activities of Daily Living Home Assistive  Devices/Equipment: None ADL Screening (condition at time of admission) Patient's cognitive ability adequate to safely complete daily activities?: Yes Is the patient deaf or have difficulty hearing?: No Does the patient have difficulty seeing, even when wearing glasses/contacts?: No Does the patient have difficulty concentrating, remembering, or making decisions?: No Patient able to express need for assistance with ADLs?: No Does the patient have difficulty dressing or bathing?: No Independently performs ADLs?: Yes (appropriate for developmental age) Does the patient have difficulty walking or climbing stairs?: No Weakness of Legs: None Weakness of Arms/Hands: None  Permission Sought/Granted Permission sought to share information with : Case Manager Permission granted to share information with : Yes, Verbal Permission Granted              Emotional Assessment Appearance:: Appears stated age Attitude/Demeanor/Rapport: Engaged Affect (typically observed): Appropriate Orientation: : Oriented to Self, Oriented to Place, Oriented to  Time, Oriented to Situation Alcohol / Substance Use: Not Applicable Psych Involvement: No (comment)  Admission diagnosis:  S/P CABG x 3 [Z95.1] Patient Active Problem List   Diagnosis Date Noted   S/P CABG x 3 07/13/2023   CAD (coronary artery disease) 06/20/2023   Atrial myxoma 06/06/2023   Wart 03/19/2023   Snoring 11/13/2022   SOB (shortness of breath) 11/12/2022   Cutaneous abscess of back excluding buttocks 01/22/2022   Weight loss 10/19/2019   Chest pain 11/12/2018  Hand pain, left 09/10/2017   Right foot pain 09/09/2017   Dermatitis due to plants, including poison ivy, sumac, and oak 05/30/2017   Hypertension 10/24/2016   Left arm pain 09/12/2016   Hyperglycemia 05/07/2016   Headache 12/07/2015   Health care maintenance 07/07/2015   Tobacco use disorder 03/25/2014   Family history of colon cancer 03/25/2013   Kidney stones 03/25/2013    Vitamin D deficiency, unspecified 03/25/2013   Pure hypercholesterolemia 03/25/2013   Pain in soft tissues of limb 03/25/2013   Esophageal reflux 03/25/2013   Family history of malignant neoplasm of gastrointestinal tract 03/25/2013   PCP:  Dale Ambler, MD Pharmacy:   [facility] - [place], [place] - [address] [place] [address] Phone: [phone] Fax: [phone]     Social Determinants of Health (SDOH) Social History: SDOH Screenings   Food Insecurity: No Food Insecurity (07/13/2023)  Housing: Patient Unable To Answer (07/13/2023)  Depression (PHQ2-9): Low Risk  (05/13/2023)  Tobacco Use: Medium Risk (07/13/2023)   SDOH Interventions:     Readmission Risk Interventions     No data to display

## 2023-07-18 NOTE — Progress Notes (Addendum)
      [address]       [place] [address]             [phone]        5 Days Post-Op Procedure(s) (LRB): CORONARY ARTERY BYPASS GRAFTING (CABG) x 3 USING LEFT INTERNAL MAMMARY ARTERY (LIMA) AND ENDOSCOPICALLY HARVESTED LEFT GREATER SAPHENOUS VEIN (N/A) LEFT ATRIAL MYXOMA RESECTION (N/A) TRANSESOPHAGEAL ECHOCARDIOGRAM (N/A) STERNAL CYST REMOVAL (N/A)  Subjective: Patient states he is "peeing a lot". He would like to walk the stairs, get his rolling walker and then go home.  Objective: Vital signs in last 24 hours: Temp:  [98.1 F (36.7 C)-98.6 F (37 C)] 98.1 F (36.7 C) (07/22 0505) Pulse Rate:  [78-97] 89 (07/22 0505) Cardiac Rhythm: Normal sinus rhythm (07/21 1936) Resp:  [17-20] 19 (07/22 0505) BP: (121-144)/(75-85) 121/85 (07/22 0505) SpO2:  [90 %-96 %] 96 % (07/22 0505) Weight:  [75.3 kg] 75.3 kg (07/22 0505)  Pre op weight 75.8 kg Current Weight  07/18/23 75.3 kg     Intake/Output from previous day: 07/21 0701 - 07/22 0700 In: -  Out: 600 [Urine:600]   Physical Exam:  Cardiovascular: RRR Pulmonary: Clear to auscultation bilaterally Abdomen: Soft, non tender, bowel sounds present. Extremities: Trace bilateral lower extremity edema. Ecchymosis left thigh Wounds: Clean and dry.  No erythema or signs of infection.  Lab Results: CBC: Recent Labs    07/16/23 0206 07/17/23 0712  WBC 12.0* 8.2  HGB 9.8* 9.2*  HCT 29.1* 28.0*  PLT 162 215   BMET:  Recent Labs    07/17/23 0712 07/18/23 0215  NA 134* 134*  K 3.4* 4.4  CL 99 101  CO2 27 22  GLUCOSE 98 95  BUN 14 17  CREATININE 0.83 0.92  CALCIUM 8.1* 8.3*    PT/INR:  Lab Results  Component Value Date   INR 1.4 (H) 07/13/2023   INR 1.0 07/11/2023   ABG:  INR: Will add last result for INR, ABG once components are confirmed Will add last 4 CBG results once components are confirmed  Assessment/Plan:  1. CV - SR. On Lopressor 25 mg bid and Plavix 75 mg daily (small  conduit). 2.  Pulmonary - On room air. Encourage incentive spirometer. 3. Volume Overload - On Lasix 40 mg daily. 4.  Expected post op acute blood loss anemia - Last H and H stable at 9.2 and 28. 5. Discharge  Donielle M ZimmermanPA-C 7:04 AM  Patient examined and discharge instructions reviewed regarding wound care and activity limitations. Ready for discharge today  patient examined and medical record reviewed,agree with above note. Lovett Sox 07/18/2023

## 2023-07-18 NOTE — Progress Notes (Signed)
Physical Therapy Treatment Patient Details Name: Thomas Harper MRN: [id number] DOB: [birth year] Today's Date: 07/18/2023   History of Present Illness Pt is a 62 y.o. male who presented 07/13/23 for CABG x3 and atrial myxoma removal, extubated same day. PMH: CAD, GERD, HTN    PT Comments  Pt received sitting in the recliner and agreeable to session with wife present. Pt able to demonstrate good stability standing without AD and use of the rollator during ambulation. Pt able to perform a flight of stairs with little unsteadiness progressing to supervision for safety. Pt requiring intermittent cues to maintain sternal precautions during session. Education provided to pt and pt's wife regarding maintaining sternal precautions during daily activities, safely navigating home set up, and activity progression.  Anticipate pt and family will be able to manage pt's mobility needs at home once medically ready for discharge.    Assistance Recommended at Discharge PRN  If plan is discharge home, recommend the following:  Can travel by private vehicle    Assistance with cooking/housework;Help with stairs or ramp for entrance      Equipment Recommendations  Rollator (4 wheels)    Recommendations for Other Services       Precautions / Restrictions Precautions Precautions: Sternal Precaution Booklet Issued: Yes (comment) Precaution Comments: reviewed precautions Restrictions Weight Bearing Restrictions: Yes Other Position/Activity Restrictions: sternal precautions     Mobility  Bed Mobility               General bed mobility comments: Pt sitting in recliner upon arrival and at end of session    Transfers Overall transfer level: Independent Equipment used: None               General transfer comment: Pt maintaining sternal precautions and demonstrating no LOB    Ambulation/Gait Ambulation/Gait assistance: Supervision Gait Distance (Feet): 250 Feet Assistive device:  Rollator (4 wheels) Gait Pattern/deviations: Step-through pattern, Decreased stride length Gait velocity: reduced     General Gait Details: pt demonstrating good stability with rollator requiring supervision for safety. Cues for rollator proximity and use of brakes.   Stairs Stairs: Yes Stairs assistance: Min guard, Supervision Stair Management: One rail Right, Step to pattern, Forwards Number of Stairs: 12 General stair comments: Pt demonstrating a step-to pattern with cues for sequencing with "good" LE up and "bad" LE down with pt alternating at times, but no LOB. Initially min guard progressing to supervision.      Balance Overall balance assessment: Mild deficits observed, not formally tested                                          Cognition Arousal/Alertness: Awake/alert Behavior During Therapy: WFL for tasks assessed/performed Overall Cognitive Status: Within Functional Limits for tasks assessed                                          Exercises      General Comments        Pertinent Vitals/Pain Pain Assessment Pain Assessment: Faces Faces Pain Scale: Hurts a little bit Pain Location: sternum Pain Descriptors / Indicators: Discomfort Pain Intervention(s): Monitored during session     PT Goals (current goals can now be found in the care plan section) Acute Rehab PT Goals Patient Stated Goal: to improve PT  Goal Formulation: With patient/family Time For Goal Achievement: 07/31/23 Potential to Achieve Goals: Good Progress towards PT goals: Progressing toward goals    Frequency    Min 1X/week      PT Plan Current plan remains appropriate       AM-PAC PT "6 Clicks" Mobility   Outcome Measure  Help needed turning from your back to your side while in a flat bed without using bedrails?: None Help needed moving from lying on your back to sitting on the side of a flat bed without using bedrails?: None Help needed  moving to and from a bed to a chair (including a wheelchair)?: None Help needed standing up from a chair using your arms (e.g., wheelchair or bedside chair)?: None Help needed to walk in hospital room?: A Little Help needed climbing 3-5 steps with a railing? : A Little 6 Click Score: 22    End of Session Equipment Utilized During Treatment: Gait belt Activity Tolerance: Patient tolerated treatment well Patient left: in chair;with call bell/phone within reach;with family/visitor present Nurse Communication: Mobility status PT Visit Diagnosis: Unsteadiness on feet (R26.81);Other abnormalities of gait and mobility (R26.89);Muscle weakness (generalized) (M62.81);Difficulty in walking, not elsewhere classified (R26.2)     Time: 4098-1191 PT Time Calculation (min) (ACUTE ONLY): 26 min  Charges:    $Gait Training: 8-22 mins $Self Care/Home Management: 8-22 PT General Charges $$ ACUTE PT VISIT: 1 Visit                     Thomas Harper, PTA Acute Rehabilitation Services Secure Chat Preferred  [phone]    Thomas Harper 07/18/2023, 9:44 AM

## 2023-07-18 NOTE — TOC Transition Note (Signed)
Transition of Care [facility]) - CM/SW Discharge Note   Patient Details  Name: Thomas Harper MRN: [id number] Date of Birth: 1961/03/24  Transition of [facility] CM/SW Contact:  Leone Haven, RN Phone Number: 07/18/2023, 10:23 AM   Clinical Narrative:    For dc today, [employer] to deliver rollator to room prior to dc. Wife to transport home.   Final next level of care: Home/Self Care Barriers to Discharge: No Barriers Identified   Patient Goals and CMS Choice   Choice offered to / list presented to : NA  Discharge Placement                         Discharge Plan and Services Additional resources added to the After Visit Summary for   In-house Referral: NA Discharge Planning Services: CM Consult Post Acute Care Choice: NA          DME Arranged: Walker rolling with seat DME Agency: [employer] Date DME Agency Contacted: 07/18/23 Time DME Agency Contacted: 1019 Representative spoke with at DME Agency: Vonna Kotyk [facility] Arranged: NA          Social Determinants of Health (SDOH) Interventions SDOH Screenings   Food Insecurity: No Food Insecurity (07/13/2023)  Housing: Patient Unable To Answer (07/13/2023)  Depression (PHQ2-9): Low Risk  (05/13/2023)  Tobacco Use: Medium Risk (07/13/2023)     Readmission Risk Interventions     No data to display

## 2023-07-19 ENCOUNTER — Telehealth: Payer: Self-pay

## 2023-07-19 ENCOUNTER — Other Ambulatory Visit: Payer: Self-pay | Admitting: Physician Assistant

## 2023-07-19 NOTE — Transitions of Care (Post Inpatient/ED Visit) (Signed)
07/19/2023  Name: Thomas Harper MRN: [id number] DOB: [birth year]  Today's TOC FU Call Status: Today's TOC FU Call Status:: Successful TOC FU Call Competed TOC FU Call Complete Date: 07/19/23  Transition Care Management Follow-up Telephone Call Date of Discharge: 07/18/23 Discharge Facility: [facility]) Type of Discharge: Inpatient Admission Primary Inpatient Discharge Diagnosis:: bypass surgeon How have you been since you were released from the hospital?: Better Any questions or concerns?: No  Items Reviewed: Did you receive and understand the discharge instructions provided?: Yes Medications obtained,verified, and reconciled?: Yes (Medications Reviewed) Any new allergies since your discharge?: No Dietary orders reviewed?: Yes Do you have support at home?: Yes People in Home: spouse  Medications Reviewed Today: Medications Reviewed Today     Reviewed by Karena Addison, LPN (Licensed Practical Nurse) on 07/19/23 at [phone]  Med List Status: <None>   Medication Order Taking? Sig Documenting Provider Last Dose Status Informant  acetaminophen (TYLENOL) 500 MG tablet [id number]  Take 1,000 mg by mouth every 8 (eight) hours as needed for headache. [provider]  Active Self  aspirin EC 81 MG tablet [id number] No Take 1 tablet (81 mg total) by mouth daily. Swallow whole.  Patient not taking: Reported on 07/06/2023   Parke Poisson, MD Not Taking Active Self  atorvastatin (LIPITOR) 80 MG tablet [id number] No Take 1 tablet (80 mg total) by mouth daily. Lennette Bihari, MD 07/12/2023 Active Self  clopidogrel (PLAVIX) 75 MG tablet [id number]  Take 1 tablet (75 mg total) by mouth daily. Doree Fudge M, PA-C  Active   furosemide (LASIX) 40 MG tablet [id number]  Take 1 tablet (40 mg total) by mouth daily. For 4 days then stop Doree Fudge M, PA-C  Active   loratadine (CLARITIN) 10 MG tablet [id number] No Take 10 mg by mouth daily as needed for allergies. [provider] Past Week Active Self  metoprolol succinate (TOPROL-XL) 25 MG 24 hr tablet [id number]  Take 1 tablet (25 mg total) by mouth daily. Doree Fudge M, PA-C  Active   potassium chloride SA (KLOR-CON M) 20 MEQ tablet [id number]  Take 1 tablet (20 mEq total) by mouth daily. For 4 days then stop. Doree Fudge M, PA-C  Active   traMADol Janean Sark) 50 MG tablet [id number]  Take 1-2 tablets (50-100 mg total) by mouth every 6 (six) hours as needed for severe pain. Ardelle Balls, PA-C  Active             Home Care and Equipment/Supplies: Were Home Health Services Ordered?: NA Has Agency set up a time to come to your home?: No Any new equipment or medical supplies ordered?: Yes Name of Medical supply agency?: [facility] Were you able to get the equipment/medical supplies?: Yes Do you have any questions related to the use of the equipment/supplies?: No  Functional Questionnaire: Do you need assistance with bathing/showering or dressing?: No Do you need assistance with meal preparation?: No Do you need assistance with eating?: No Do you have difficulty maintaining continence: No Do you need assistance with getting out of bed/getting out of a chair/moving?: No Do you have difficulty managing or taking your medications?: No  Follow up appointments reviewed: PCP Follow-up appointment confirmed?: Yes Date of PCP follow-up appointment?: 07/29/23 Follow-up Provider: [facility] Follow-up appointment confirmed?: Yes Date of Specialist follow-up appointment?: 07/22/23 Follow-Up Specialty Provider:: cardio Do you need transportation to your follow-up appointment?: No Do you understand care options if your condition(s) worsen?: Yes-patient verbalized understanding  SIGNATURE Karena Addison, LPN [facility] Nurse Health Advisor Direct Dial [phone]

## 2023-07-20 ENCOUNTER — Ambulatory Visit (INDEPENDENT_AMBULATORY_CARE_PROVIDER_SITE_OTHER): Payer: Self-pay | Admitting: Thoracic Surgery (Cardiothoracic Vascular Surgery)

## 2023-07-20 DIAGNOSIS — D151 Benign neoplasm of heart: Secondary | ICD-10-CM

## 2023-07-20 DIAGNOSIS — Z951 Presence of aortocoronary bypass graft: Secondary | ICD-10-CM

## 2023-07-20 NOTE — Progress Notes (Signed)
     [address]       [place] [address]             [phone]       Patient: Home Provider: Office Consent for Telemedicine visit obtained.  Today's visit was completed via a real-time telehealth (see specific modality noted below). The patient/authorized person provided oral consent at the time of the visit to engage in a telemedicine encounter with the present provider at [facility]. The patient/authorized person was informed of the potential benefits, limitations, and risks of telemedicine. The patient/authorized person expressed understanding that the laws that protect confidentiality also apply to telemedicine. The patient/authorized person acknowledged understanding that telemedicine does not provide emergency services and that he or she would need to call 911 or proceed to the nearest hospital for help if such a need arose.   Total time spent in the clinical discussion 10 minutes.  Telehealth Modality: Phone visit (audio only)  I had a telephone visit with  Thomas Harper who is s/p CABG, LA myxoma resection.  Overall doing well.  Pain is minimal.  Ambulating well. Vitals have been stable.  Thomas Harper will see [place] back in 1 month with a chest x-ray for cardiac rehab clearance.  [name] Scrape

## 2023-07-22 ENCOUNTER — Telehealth: Payer: Self-pay | Admitting: *Deleted

## 2023-07-22 ENCOUNTER — Ambulatory Visit
Admission: RE | Admit: 2023-07-22 | Discharge: 2023-07-22 | Disposition: A | Discharge status: Home / Self Care | Payer: 59 | Source: Ambulatory Visit | Attending: Thoracic Surgery (Cardiothoracic Vascular Surgery) | Admitting: Thoracic Surgery (Cardiothoracic Vascular Surgery)

## 2023-07-22 ENCOUNTER — Telehealth: Payer: Self-pay | Admitting: Thoracic Surgery (Cardiothoracic Vascular Surgery)

## 2023-07-22 ENCOUNTER — Ambulatory Visit: Payer: 59 | Admitting: Internal Medicine

## 2023-07-22 ENCOUNTER — Other Ambulatory Visit: Payer: Self-pay | Admitting: *Deleted

## 2023-07-22 DIAGNOSIS — Z951 Presence of aortocoronary bypass graft: Secondary | ICD-10-CM

## 2023-07-22 NOTE — Telephone Encounter (Signed)
Patient and wife contacted the office stating patient has had increased chest tightness since yesterday. Per patient, he coughed and his chest has not felt the same since. Denies hearing any popping or instability in chest. Denies SOB. Per wife, patient has been doing "too much". States he is taking Tylenol and Tramadol but medication is not helping as much currently. CXR ordered and reviewed by Webb Laws, PA. Patient advised cxr is fine. Advised patient to begin taking Tramadol and Tylenol on schedule to help with discomfort. Wife and patient verbalized understanding.

## 2023-07-27 MED FILL — Heparin Sodium (Porcine) Inj 1000 Unit/ML: INTRAMUSCULAR | Qty: 20 | Status: AC

## 2023-07-27 MED FILL — Sodium Chloride IV Soln 0.9%: INTRAVENOUS | Qty: 2000 | Status: AC

## 2023-07-27 MED FILL — Electrolyte-R (PH 7.4) Solution: INTRAVENOUS | Qty: 3000 | Status: AC

## 2023-07-27 MED FILL — Sodium Bicarbonate IV Soln 8.4%: INTRAVENOUS | Qty: 50 | Status: AC

## 2023-07-27 MED FILL — Calcium Chloride Inj 10%: INTRAVENOUS | Qty: 10 | Status: AC

## 2023-07-27 NOTE — Progress Notes (Signed)
Cardiology Office Note:  .   Date:  08/03/2023  ID:  Thomas Harper, DOB August 19, 1961, MRN [id number] PCP: Dale [place], MD  [facility] Providers Cardiologist:  Little Ishikawa, MD   History of Present Illness: .   Thomas Harper is a 62 y.o. male with a past medical history of CAD, HTN, atrial myxoma, GERD. Patient is followed by Dr. Bjorn Pippin and presents today for follow up after CABG.   Per chart review, patient established care with Dr. Bjorn Pippin in 04/2023 for evaluation of chest pain. He underwent coronary CTA on 06/03/23 that showed coronary calcium score of 434 (86th percentile), findings concerning for left atrial myxoma. There was severe stenosis noted, and FFR confirmed significant functional stenosis in the LAD, LCX, and RCA. Patient underwent echocardiogram on 06/08/23 that showed EF 60-65%, no regional wall motion abnormalities, grade I DD, normal RV systolic function, a 22-16 mm mass attached to the intraatrial septum on the LA side, mild MR. He was sent for left heart catheterization on 06/09/23 that revealed multivessel CAD. Patient was referred to CT surgery for outpatient surgical consultation. He underwent CABGx3 with LIMA-LAD, reverse SVG-PDA and OM, and left atrial myxoma resection on 7/17. His postoperative course was uneventful and he was discharged on 07/18/23. He was discharged on ASA, lipitor, plavix, lasix for 4 days, metoprolol succinate.   Patient presents to the office today accompanied by his wife. Reports that he has been doing OK since his CABG, but his recovery has been a little harder than he expected. He continues to have some pain around his incision that is relieved if he hugs his pillow. Pain is also relieved with his pain medications. He has started to walk around a bit more and has noticed shortness of breath on exertion. However, his shortness of breath has been improving since he has his surgery. Denies syncope, near syncope, dizziness. Denies  palpitations. He takes his BP and HR at home, and he has noticed that his HR has been faster than usual for the past several days. He saw his PCP a few days ago who was concerned that he had an infection because of tachycardia, elevated WBC count, and elevated platelets. He also had a rash under his armpits and was treated with antibiotics. Rash has improved. Reports that he does not have much of an appetite and his wife has been trying to get him to eat more. Ankle edema has improved since his surgery   ROS: Denies syncope, near syncope, dizziness, palpitations. Does have chest pain near his incision, decreased appetite   Studies Reviewed: Marland Kitchen   EKG Interpretation Date/Time:  Wednesday August 03 2023 13:25:04 EDT Ventricular Rate:  131 PR Interval:    QRS Duration:  80 QT Interval:  302 QTC Calculation: 445 R Axis:   38  Text Interpretation: Atrial flutter with 2:1 A-V conduction Nonspecific ST and T wave abnormality When compared with ECG of 14-Jul-2023 07:00, Atrial flutter has replaced Sinus rhythm Vent. rate has increased BY  66 BPM ST depression has replaced ST elevation in Lateral leads T wave inversion now evident in Lateral leads Confirmed by Robet Leu [id number] on 08/03/2023 1:40:19 PM    Cardiac Studies & Procedures   CARDIAC CATHETERIZATION  CARDIAC CATHETERIZATION 06/09/2023  Narrative   Prox RCA to Mid RCA lesion is 99% stenosed.   Prox Cx lesion is 20% stenosed.   Prox Cx to Mid Cx lesion is 40% stenosed.   Mid Cx to Dist Cx lesion  is 75% stenosed.   Mid Cx lesion is 70% stenosed.   Mid LAD-1 lesion is 20% stenosed.   Mid LAD-2 lesion is 50% stenosed.   Mid LAD to Dist LAD lesion is 90% stenosed.   The left ventricular systolic function is normal.   LV end diastolic pressure is normal.   The left ventricular ejection fraction is 50-55% by visual estimate.   Recommend Aspirin 81mg  daily for moderate CAD.  Multivessel CAD with high-grade noncalcified mid 50 to focally  90% LAD stenosis,, diffuse moderate circumflex disease with 75% focal stenosis after marginal vessel, and subtotal mid RCA stenosis with both bridging right to right and left-to-right collaterals supplying a large PDA PLA system.  Low normal LV function with EF estimated at 50 to 55% with subtle focal mid anterolateral wall motion abnormality; LVEDP 12 mm.  RECOMMENDATION: Plan for outpatient surgical consultation in this patient with left atrial myxoma and multivessel CAD.  He has been on amlodipine.  Will initiate beta-blocker and nitrate therapy.  Will also change lovastatin to high potency atorvastatin 80 mg.  Findings Coronary Findings Diagnostic  Dominance: Right  Left Anterior Descending Mid LAD-1 lesion is 20% stenosed. Mid LAD-2 lesion is 50% stenosed. Mid LAD to Dist LAD lesion is 90% stenosed.  Third Septal Branch Vessel is small in size.  Left Circumflex Prox Cx lesion is 20% stenosed. Prox Cx to Mid Cx lesion is 40% stenosed. Mid Cx lesion is 70% stenosed. Mid Cx to Dist Cx lesion is 75% stenosed.  Fourth Obtuse Marginal Branch Vessel is small in size.  Right Coronary Artery Prox RCA to Mid RCA lesion is 99% stenosed.  Right Ventricular Branch Vessel is small in size.  Right Posterior Atrioventricular Artery Collaterals RPAV filled by collaterals from RV Branch.  Third Right Posterolateral Branch Collaterals 3rd RPL filled by collaterals from 1st Mrg.  Collaterals 3rd RPL filled by collaterals from 2nd Sept.  Intervention  No interventions have been documented.     ECHOCARDIOGRAM  ECHOCARDIOGRAM COMPLETE 06/08/2023  Narrative ECHOCARDIOGRAM REPORT    Patient Name:   Thomas Harper Date of Exam: 06/08/2023 Medical Rec #:  [id number]       Height:       66.0 in Accession #:    [id number]      Weight:       166.2 lb Date of Birth:  1961-03-05       BSA:          1.848 m Patient Age:    62 years        BP:           132/86 mmHg Patient Gender: M                HR:           72 bpm. Exam Location:  [place]  Procedure: 2D Echo, 3D Echo, Color Doppler, Cardiac Doppler and Strain Analysis  Indications:    R06.9 DOE; R07.2 Precordial pain  History:        Patient has no prior history of Echocardiogram examinations. CAD, Signs/Symptoms:Chest Pain and Dyspnea; Risk Factors:Hypertension, Dyslipidemia, Former Smoker and Family History of Coronary Artery Disease. Patient complains of chest discomfort with DOE. He denies leg edema.  Sonographer:    Carlos American RVT, RDCS (AE), RDMS Referring Phys: [phone] CHRISTOPHER L SCHUMANN  IMPRESSIONS   1. Left ventricular ejection fraction, by estimation, is 60 to 65%. The left ventricle has normal function. The left ventricle has  no regional wall motion abnormalities. Left ventricular diastolic parameters are consistent with Grade I diastolic dysfunction (impaired relaxation). 2. Right ventricular systolic function is normal. The right ventricular size is normal. 3. Mass measuring 22x16 mm attached to intraatrial sepum on the left atrial side noted. Differencial should include myxoma - most likely v/s thrombus. 4. The mitral valve is normal in structure. Mild mitral valve regurgitation. No evidence of mitral stenosis. 5. The aortic valve is normal in structure. Aortic valve regurgitation is not visualized. No aortic stenosis is present. 6. The inferior vena cava is normal in size with greater than 50% respiratory variability, suggesting right atrial pressure of 3 mmHg.  Comparison(s): CT calcium score 06/03/2023- Pedunculated well circumscribed 20 X 24 mm x 16 mm mass attached to the fossa ovalis of the left atrial septum with a small stalk. This is concerning for atrial myxoma.   FINDINGS Left Ventricle: Left ventricular ejection fraction, by estimation, is 60 to 65%. The left ventricle has normal function. The left ventricle has no regional wall motion abnormalities. The left ventricular  internal cavity size was normal in size. There is no left ventricular hypertrophy. Left ventricular diastolic parameters are consistent with Grade I diastolic dysfunction (impaired relaxation).  Right Ventricle: The right ventricular size is normal. No increase in right ventricular wall thickness. Right ventricular systolic function is normal.  Left Atrium: Mass measuring 22x16 mm attached to intraatrial sepum on the left atrial side noted. Differencial should include myxoma - most likely v/s thrombus. Left atrial size was normal in size.  Right Atrium: Right atrial size was normal in size.  Pericardium: There is no evidence of pericardial effusion.  Mitral Valve: The mitral valve is normal in structure. Mild mitral valve regurgitation. No evidence of mitral valve stenosis.  Tricuspid Valve: The tricuspid valve is normal in structure. Tricuspid valve regurgitation is not demonstrated. No evidence of tricuspid stenosis.  Aortic Valve: The aortic valve is normal in structure. Aortic valve regurgitation is not visualized. No aortic stenosis is present. Aortic valve mean gradient measures 3.0 mmHg. Aortic valve peak gradient measures 6.8 mmHg. Aortic valve area, by VTI measures 2.57 cm.  Pulmonic Valve: The pulmonic valve was normal in structure. Pulmonic valve regurgitation is not visualized. No evidence of pulmonic stenosis.  Aorta: The aortic root is normal in size and structure.  Venous: The inferior vena cava is normal in size with greater than 50% respiratory variability, suggesting right atrial pressure of 3 mmHg.  IAS/Shunts: No atrial level shunt detected by color flow Doppler.   LEFT VENTRICLE PLAX 2D LVIDd:         5.10 cm   Diastology LVIDs:         3.80 cm   LV e' medial:    7.29 cm/s LV PW:         0.90 cm   LV E/e' medial:  8.8 LV IVS:        0.90 cm   LV e' lateral:   10.80 cm/s LVOT diam:     2.10 cm   LV E/e' lateral: 5.9 LV SV:         51 LV SV Index:   28 LVOT  Area:     3.46 cm  3D Volume EF: 3D EF:        54 % LV EDV:       108 ml LV ESV:       49 ml LV SV:        59 ml  RIGHT VENTRICLE RV S prime:     13.90 cm/s TAPSE (M-mode): 2.2 cm  LEFT ATRIUM             Index        RIGHT ATRIUM           Index LA diam:        3.00 cm 1.62 cm/m   RA Area:     16.50 cm LA Vol (A2C):   41.3 ml 22.34 ml/m  RA Volume:   45.40 ml  24.56 ml/m LA Vol (A4C):   48.2 ml 26.08 ml/m LA Biplane Vol: 44.8 ml 24.24 ml/m AORTIC VALVE                    PULMONIC VALVE AV Area (Vmax):    2.36 cm     PV Vmax:       0.80 m/s AV Area (Vmean):   2.53 cm     PV Peak grad:  2.6 mmHg AV Area (VTI):     2.57 cm AV Vmax:           130.00 cm/s AV Vmean:          74.200 cm/s AV VTI:            0.198 m AV Peak Grad:      6.8 mmHg AV Mean Grad:      3.0 mmHg LVOT Vmax:         88.70 cm/s LVOT Vmean:        54.200 cm/s LVOT VTI:          0.147 m LVOT/AV VTI ratio: 0.74  AORTA Ao Root diam: 3.50 cm Ao Arch diam: 2.8 cm  MITRAL VALVE MV Area (PHT): 3.65 cm    SHUNTS MV Decel Time: 208 msec    Systemic VTI:  0.15 m MV E velocity: 64.00 cm/s  Systemic Diam: 2.10 cm MV A velocity: 89.70 cm/s MV E/A ratio:  0.71  Gypsy Balsam MD Electronically signed by Gypsy Balsam MD Signature Date/Time: 06/08/2023/4:54:17 PM    Final   TEE  ECHO INTRAOPERATIVE TEE 07/13/2023  Narrative *INTRAOPERATIVE TRANSESOPHAGEAL REPORT *    Patient Name:   Steffen L Quiggle Date of Exam: 07/13/2023 Medical Rec #:  [id number]       Height:       66.0 in Accession #:    [id number]      Weight:       167.0 lb Date of Birth:  1961-03-08       BSA:          1.85 m Patient Age:    62 years        BP:           144/87 mmHg Patient Gender: M               HR:           83 bpm. Exam Location:  Anesthesiology  Transesophogeal exam was perform intraoperatively during surgical procedure. Patient was closely monitored under general anesthesia during the entirety  of examination.  Indications:     Left atrial myxoma Performing Phys: [id number] Eliezer Lofts LIGHTFOOT Diagnosing Phys: Val Eagle MD  Complications: No known complications during this procedure. POST-OP IMPRESSIONS _ Left Ventricle: has normal systolic function, with an ejection fraction of 65%. The cavity size was normal. The wall motion is normal. _ Right Ventricle: normal function. The cavity was normal. The wall motion is normal. _ Aorta:  there is no dissection present in the aorta. _ Aortic Valve: The aortic valve appears unchanged from pre-bypass. _ Mitral Valve: The mitral valve appears unchanged from pre-bypass. _ Tricuspid Valve: The tricuspid valve appears unchanged from pre-bypass. _ Comments: previously seen LA myxoma no longer present. No ASD or PFO noted post removal.  PRE-OP FINDINGS Left Ventricle: The left ventricle has normal systolic function, with an ejection fraction of 60-65%. The cavity size was normal. No evidence of left ventricular regional wall motion abnormalities. There is no left ventricular hypertrophy.   Right Ventricle: The right ventricle has normal systolic function. The cavity was normal. There is no increase in right ventricular wall thickness.  Left Atrium: Left atrial size was normal in size. No left atrial/left atrial appendage thrombus was detected. 1.6 x 1.88/2 cm myxoma attached to LA septum. The left atrial appendage is well visualized and there is no evidence of thrombus present. Left atrial appendage velocity is normal at greater than 40 cm/s.  Right Atrium: Right atrial size was normal in size.  Interatrial Septum: No atrial level shunt detected by color flow Doppler. There is no evidence of a patent foramen ovale.  Pericardium: There is no evidence of pericardial effusion.  Mitral Valve: The mitral valve is normal in structure. Mitral valve regurgitation is mild by color flow Doppler. The MR jet is centrally-directed. There is No  evidence of mitral stenosis.  Tricuspid Valve: The tricuspid valve was normal in structure. Tricuspid valve regurgitation is trivial by color flow Doppler. No evidence of tricuspid stenosis is present.  Aortic Valve: The aortic valve is tricuspid Aortic valve regurgitation is trivial by color flow Doppler. There is no stenosis of the aortic valve, with a calculated valve area of 2.41 cm.   Pulmonic Valve: The pulmonic valve was normal in structure, with normal. No evidence of pumonic stenosis. Pulmonic valve regurgitation is not visualized by color flow Doppler.   Aorta: There is evidence of plaque in the descending aorta; Grade I, measuring 1-24mm in size. There is evidence of a dissection in the none.  +--------------+--------++ LEFT VENTRICLE         +--------------+--------++ PLAX 2D                +--------------+--------++ LVOT diam:    2.40 cm  +--------------+--------++ LVOT Area:    4.52 cm +--------------+--------++                        +--------------+--------++  +------------------+-----------++ AORTIC VALVE                  +------------------+-----------++ AV Area (Vmax):   2.32 cm    +------------------+-----------++ AV Area (Vmean):  2.36 cm    +------------------+-----------++ AV Area (VTI):    2.41 cm    +------------------+-----------++ AV Vmax:          139.00 cm/s +------------------+-----------++ AV Vmean:         83.800 cm/s +------------------+-----------++ AV VTI:           0.283 m     +------------------+-----------++ AV Peak Grad:     7.7 mmHg    +------------------+-----------++ AV Mean Grad:     3.0 mmHg    +------------------+-----------++ LVOT Vmax:        71.30 cm/s  +------------------+-----------++ LVOT Vmean:       43.800 cm/s +------------------+-----------++ LVOT VTI:         0.150 m     +------------------+-----------++ LVOT/AV VTI ratio:0.53         +------------------+-----------++  +--------------+-------++  AORTA                 +--------------+-------++ Ao Sinus diam:3.20 cm +--------------+-------++ Ao STJ diam:  2.4 cm  +--------------+-------++ Ao Asc diam:  3.00 cm +--------------+-------++  +-------------+---------++ MITRAL VALVE           +--------------+-------+ +-------------+---------++ SHUNTS                MV Peak grad:1.5 mmHg  +--------------+-------+ +-------------+---------++ Systemic VTI: 0.15 m  MV Mean grad:1.0 mmHg  +--------------+-------+ +-------------+---------++ Systemic Diam:2.40 cm MV Vmax:     0.61 m/s  +--------------+-------+ +-------------+---------++ MV Vmean:    31.9 cm/s +-------------+---------++ MV VTI:      0.17 m    +-------------+---------++   Val Eagle MD Electronically signed by Val Eagle MD Signature Date/Time: 07/27/2023/10:02:54 PM    Final    CT SCANS  CT CORONARY MORPH W/CTA COR W/SCORE 06/03/2023  Addendum 06/11/2023 12:18 PM ADDENDUM REPORT: 06/11/2023 12:16  EXAM: OVER-READ INTERPRETATION  CT CHEST  The following report is an over-read performed by radiologist Dr. Curly Shores [facility] on 06/11/2023. This over-read does not include interpretation of cardiac or coronary anatomy or pathology. The coronary CTA interpretation by the cardiologist is attached.  COMPARISON:  11/04/2018  FINDINGS: Cardiovascular:  Findings discussed in the body of the report.  Mediastinum/Nodes: No suspicious adenopathy identified. Imaged mediastinal structures are unremarkable.  Lungs/Pleura: There is dependent basilar subsegmental atelectasis. No pneumonia or pulmonary edema. No pleural effusion or pneumothorax.  Upper Abdomen: No acute abnormality.  Musculoskeletal: Superficial cystic lesion in the midline presternal region likely represents an epidermoid inclusion cyst. This  is stable. There is minimal bilateral gynecomastia. No acute or significant osseous findings.  IMPRESSION: No significant extracardiac incidental findings identified.   Electronically Signed By: Layla Maw M.D. On: 06/11/2023 12:16  Narrative CLINICAL DATA:  62 Year-old Male  EXAM: Cardiac/Coronary  CTA  TECHNIQUE: The patient was scanned on a [employer].  FINDINGS: Scan was triggered in the descending thoracic aorta. Axial non-contrast 3 mm slices were carried out through the heart. The data set was analyzed on a dedicated work station and scored using the Agatson method. Gantry rotation speed was 250 msecs and collimation was .6 mm. 0.8 mg of sl NTG was given. The 3D data set was reconstructed in 5% intervals of the 67-82 % of the R-R cycle. Diastolic phases were analyzed on a dedicated work station using MPR, MIP and VRT modes. The patient received 95 cc of contrast.  Coronary Arteries:  Normal coronary origin.  Right dominance.  Coronary Calcium Score:  Left main: 0  Left anterior descending artery: 364  Left circumflex artery: 58  Right coronary artery: 11  Total: 434  Percentile: 86th for age, sex, and race matched control.  RCA is a large dominant artery that gives rise to PDA and PLA. Minimal non-obstructive luminal narrowing (1-24%) in proximal RCA. There is a severe soft plaque stenosis (70-99%) in the mid RCA. Minimal distal calcified plaque.  Left main is a large artery that gives rise to LAD and LCX arteries. There is no significant plaque.  LAD is a large vessel that gives rise to one large D1 Branch. Minimal non-obstructive mixed plaques (1-24%) in proximal LAD, with mild non-obstructive mixed plaque (25-49%) in the proximal-mid LAD. There is a severe soft plaque stenosis in the mid LAD. Distal stair step artifact.  LCX is a non-dominant artery that gives rise to one large OM1 branch. Mild non-obstructive  mixed plaque  (25-49%) in the proximal LCX. There is a moderate soft plaque stenosis (50-70%) in the mid LCX. Distal stair step artifact.  Other findings:  There is a pedunculated well circumscribed 20 X 24 mm x 16 mm mass attached to the fossa ovalis of the left atrial septum with a small stalk. This is concerning for atrial myxoma. Secondary thrombus is not visualized in this study but cannot be excluded.  Aorta: Normal size.  No calcifications.  No dissection.  Main Pulmonary Artery: Normal size of the pulmonary artery.  Systemic Veins: Normal drainage  Aortic Valve:  Tri-leaflet.  No calcifications.  Mitral valve: Normal  Normal pulmonary vein drainage into the left atrium.  Normal left atrial appendage without a thrombus.  Left Ventricle: Normal size  Left Atrium: Normal size  Right Ventricle: Dilated size  Right Atrium: Dilated size  Pericardium: Normal thickness  Extra-cardiac findings: See attached radiology report for non-cardiac structures.  IMPRESSION: 1. Coronary calcium score of 434. This was 86th percentile for age, sex, and race matched control.  2. Findings are concerning for a left atrial myxoma. Difficult comparison to prior study; left atrial mass is much larger on this, dedicated, study.  3. CAD-RADS 4 Severe stenosis. (70-99% or > 50% left main). Cardiac catheterization or CT FFR is recommended. Consider symptom-guided anti-ischemic pharmacotherapy as well as risk factor modification per guideline directed care.  5. Attempted to contact patient and family X 5. Discussed with inpatient cardiology team; they will continue to reach out to patient.  RECOMMENDATIONS: RECOMMENDATIONS The proposed cut-off value of 1,651 AU yielded a 93 % sensitivity and 75 % specificity in grading AS severity in patients with classical low-flow, low-gradient AS. Proposed different cut-off values to define severe AS for men and women as 2,065 AU and 1,274 AU, respectively.  The joint European and American recommendations for the assessment of AS consider the aortic valve calcium score as a continuum - a very high calcium score suggests severe AS and a low calcium score suggests severe AS is unlikely.  Sunday Shams, et al. 2017 ESC/EACTS Guidelines for the management of valvular heart disease. Eur Heart J 2017;38:2739-91.  Coronary artery calcium (CAC) score is a strong predictor of incident coronary heart disease (CHD) and provides predictive information beyond traditional risk factors. CAC scoring is reasonable to use in the decision to withhold, postpone, or initiate statin therapy in intermediate-risk or selected borderline-risk asymptomatic adults (age 14-75 years and LDL-C >=70 to <190 mg/dL) who do not have diabetes or established atherosclerotic cardiovascular disease (ASCVD).* In intermediate-risk (10-year ASCVD risk >=7.5% to <20%) adults or selected borderline-risk (10-year ASCVD risk >=5% to <7.5%) adults in whom a CAC score is measured for the purpose of making a treatment decision the following recommendations have been made:  If CAC = 0, it is reasonable to withhold statin therapy and reassess in 5 to 10 years, as long as higher risk conditions are absent (diabetes mellitus, family history of premature CHD in first degree relatives (males <55 years; females <65 years), cigarette smoking, LDL >=190 mg/dL or other independent risk factors).  If CAC is 1 to 99, it is reasonable to initiate statin therapy for patients >=1 years of age.  If CAC is >=100 or >=75th percentile, it is reasonable to initiate statin therapy at any age.  Cardiology referral should be considered for patients with CAC scores =400 or >=75th percentile.  *2018 AHA/ACC/AACVPR/AAPA/ABC/ACPM/ADA/AGS/APhA/ASPC/NLA/PCNA Guideline on the Management of Blood Cholesterol: A Report  of the [employer] of Cardiology/American Heart Association Task  Force on Clinical Practice Guidelines. J Am Coll Cardiol. 2019;73(24):3168-3209.  Riley Lam, MD  Electronically Signed: By: Riley Lam M.D. On: 06/03/2023 18:15           Risk Assessment/Calculations:    CHA2DS2-VASc Score = 2  This indicates a 2.2% annual risk of stroke. The patient's score is based upon: CHF History: 0 HTN History: 1 Diabetes History: 0 Stroke History: 0 Vascular Disease History: 1 Age Score: 0 Gender Score: 0      Physical Exam:   VS:  BP 110/88   Pulse (!) 131   Ht 5\' 6"  (1.676 m)   Wt 158 lb (71.7 kg)   BMI 25.50 kg/m    Wt Readings from Last 3 Encounters:  08/03/23 158 lb (71.7 kg)  07/29/23 156 lb (70.8 kg)  07/18/23 165 lb 14.4 oz (75.3 kg)    GEN: Well nourished, well developed in no acute distress. Sitting comfortably on the exam table  NECK: No JVD CARDIAC: Regular rhythm, tachycardic. No murmurs. Radial pulses 2+ bilaterally. Incision in the middle of his chest is clean and dry without erythema, warmth, bleeding, or drainage  RESPIRATORY:  Clear to auscultation without rales, wheezing or rhonchi  ABDOMEN: Soft, non-tender, non-distended EXTREMITIES:  No edema in BLE; No deformity   ASSESSMENT AND PLAN: .    New Atrial Flutter with RVR - EKG today showed 2:1 atrial flutter with HR 131 BPM. BP stable at 110/88 - Patient was not aware that he was in afib. Does have some fatigue and shortness of breath on exertion- unclear whether this is due to afib or recent CABG. Denies palpitations, syncope, near syncope, dizziness - Transition from metoprolol succinate 25 mg daily to metoprolol tartrate 50 mg BID  - CHADS-VASc 2 (HTN, vascular disease). Start eliquis 5 mg BID. For now, continue ASA and plavix as patient is less than 1 month post CABG. I have reached out to Dr. Bjorn Pippin to discuss how long patient should remain on triple therapy/what to do with antiplatelet therapy  - Instructed patient to monitor stools and  urine for signs of bleeding while on triple therapy - Instructed patient to keep a BP/HR log and to contact the office if his SBP is consistently less than 95, if his HR is >140s, or if he develops worsening shortness of breath or lightheadedness  - I have arranged a close follow up appointment for patient on 8/13- if HR remains elevated at that time, may need TEE/DCCV. If HR is elevated, can consider DCCV after 3 weeks uninterrupted eliquis. Can also consider limited echo at that time to assess EF- currently, patient does not have signs/symptoms of heart failure  - Ordered CMP, CBC, mag, and TSH to be collected today   CAD S/p CABG  - Patient recently underwent CABG x3 with LIMA-LAD, reverse SVG-PDA and OM.  - After surgery, patient was on lasix for approx 1 week. Is now euvolemic on exam  - Continue ASA, plavix (on plavix for small venous and arterial conduits available for grafting per CT surgery). As above, I have reached out to Dr. Bjorn Pippin to discuss antiplatelet therapy with eliquis start  - Continue lipitor 80 mg daily  - Transitioned to metoprolol tartrate 50 mg BID as above for rate control   Left Atrial Myxoma s/p resection  - Patient underwent resection of LA myxoma at the time of CABG on 7/17  HLD  - Recent lipid  panel from 07/29/23 showed LDL 72, HDL 36, triglycerides 161, total cholesterol 141 - Continue lipitor 80 mg daily   HTN  - BP well controlled on current mediations  - Provided BP log and instructed patient to monitor BP closely while in aflutter with RVR.     Cardiac Rehabilitation Eligibility Assessment         Dispo: Follow up 8/13   Signed, Jonita Albee, PA-C

## 2023-07-29 ENCOUNTER — Encounter: Payer: Self-pay | Admitting: Internal Medicine

## 2023-07-29 ENCOUNTER — Other Ambulatory Visit: Payer: Self-pay | Admitting: Physician Assistant

## 2023-07-29 ENCOUNTER — Ambulatory Visit (INDEPENDENT_AMBULATORY_CARE_PROVIDER_SITE_OTHER): Payer: 59 | Admitting: Internal Medicine

## 2023-07-29 VITALS — BP 130/80 | HR 103 | Temp 97.6°F | Ht 66.0 in | Wt 156.0 lb

## 2023-07-29 DIAGNOSIS — I2089 Other forms of angina pectoris: Secondary | ICD-10-CM

## 2023-07-29 DIAGNOSIS — K219 Gastro-esophageal reflux disease without esophagitis: Secondary | ICD-10-CM

## 2023-07-29 DIAGNOSIS — E7800 Pure hypercholesterolemia, unspecified: Secondary | ICD-10-CM | POA: Diagnosis not present

## 2023-07-29 DIAGNOSIS — I10 Essential (primary) hypertension: Secondary | ICD-10-CM | POA: Diagnosis not present

## 2023-07-29 DIAGNOSIS — D649 Anemia, unspecified: Secondary | ICD-10-CM | POA: Diagnosis not present

## 2023-07-29 DIAGNOSIS — R21 Rash and other nonspecific skin eruption: Secondary | ICD-10-CM

## 2023-07-29 DIAGNOSIS — R739 Hyperglycemia, unspecified: Secondary | ICD-10-CM | POA: Diagnosis not present

## 2023-07-29 DIAGNOSIS — I25119 Atherosclerotic heart disease of native coronary artery with unspecified angina pectoris: Secondary | ICD-10-CM | POA: Diagnosis not present

## 2023-07-29 DIAGNOSIS — D151 Benign neoplasm of heart: Secondary | ICD-10-CM

## 2023-07-29 DIAGNOSIS — Z951 Presence of aortocoronary bypass graft: Secondary | ICD-10-CM

## 2023-07-29 LAB — IBC + FERRITIN
Ferritin: 154.6 ng/mL (ref 22.0–322.0)
Iron: 31 ug/dL — ABNORMAL LOW (ref 42–165)
Saturation Ratios: 9.8 % — ABNORMAL LOW (ref 20.0–50.0)
TIBC: 315 ug/dL (ref 250.0–450.0)
Transferrin: 225 mg/dL (ref 212.0–360.0)

## 2023-07-29 LAB — HEPATIC FUNCTION PANEL
ALT: 25 U/L (ref 0–53)
AST: 17 U/L (ref 0–37)
Albumin: 4.1 g/dL (ref 3.5–5.2)
Alkaline Phosphatase: 127 U/L — ABNORMAL HIGH (ref 39–117)
Bilirubin, Direct: 0.1 mg/dL (ref 0.0–0.3)
Total Bilirubin: 0.5 mg/dL (ref 0.2–1.2)
Total Protein: 7 g/dL (ref 6.0–8.3)

## 2023-07-29 LAB — LIPID PANEL
Cholesterol: 141 mg/dL (ref 0–200)
HDL: 36.4 mg/dL — ABNORMAL LOW (ref >39.00)
LDL Cholesterol: 72 mg/dL (ref 0–99)
NonHDL: 104.62
Total CHOL/HDL Ratio: 4
Triglycerides: 161 mg/dL — ABNORMAL HIGH (ref 0.0–149.0)
VLDL: 32.2 mg/dL (ref 0.0–40.0)

## 2023-07-29 LAB — CBC WITH DIFFERENTIAL/PLATELET
Basophils Absolute: 0.1 10*3/uL (ref 0.0–0.1)
Basophils Relative: 0.7 % (ref 0.0–3.0)
Eosinophils Absolute: 0.2 10*3/uL (ref 0.0–0.7)
Eosinophils Relative: 1.3 % (ref 0.0–5.0)
HCT: 37 % — ABNORMAL LOW (ref 39.0–52.0)
Hemoglobin: 11.8 g/dL — ABNORMAL LOW (ref 13.0–17.0)
Lymphocytes Relative: 9.8 % — ABNORMAL LOW (ref 12.0–46.0)
Lymphs Abs: 1.3 10*3/uL (ref 0.7–4.0)
MCHC: 31.9 g/dL (ref 30.0–36.0)
MCV: 89 fl (ref 78.0–100.0)
Monocytes Absolute: 1 10*3/uL (ref 0.1–1.0)
Monocytes Relative: 7.7 % (ref 3.0–12.0)
Neutro Abs: 10.3 10*3/uL — ABNORMAL HIGH (ref 1.4–7.7)
Neutrophils Relative %: 80.5 % — ABNORMAL HIGH (ref 43.0–77.0)
Platelets: 713 10*3/uL — ABNORMAL HIGH (ref 150.0–400.0)
RBC: 4.15 Mil/uL — ABNORMAL LOW (ref 4.22–5.81)
RDW: 13.9 % (ref 11.5–15.5)
WBC: 12.8 10*3/uL — ABNORMAL HIGH (ref 4.0–10.5)

## 2023-07-29 LAB — BASIC METABOLIC PANEL
BUN: 18 mg/dL (ref 6–23)
CO2: 27 mEq/L (ref 19–32)
Calcium: 9.6 mg/dL (ref 8.4–10.5)
Chloride: 99 mEq/L (ref 96–112)
Creatinine, Ser: 0.91 mg/dL (ref 0.40–1.50)
GFR: 90.53 mL/min (ref >60.00)
Glucose, Bld: 81 mg/dL (ref 70–99)
Potassium: 4.9 mEq/L (ref 3.5–5.1)
Sodium: 136 mEq/L (ref 135–145)

## 2023-07-29 LAB — VITAMIN B12: Vitamin B-12: 496 pg/mL (ref 211–911)

## 2023-07-29 MED ORDER — CEPHALEXIN 500 MG PO CAPS: 500.0000 mg | ORAL_CAPSULE | Freq: Three times a day (TID) | ORAL | 0 refills | Status: DC

## 2023-07-29 MED ORDER — TRAMADOL HCL 50 MG PO TABS: 50.0000 mg | ORAL_TABLET | Freq: Four times a day (QID) | ORAL | 0 refills | Status: DC | PRN

## 2023-07-29 MED ORDER — PANTOPRAZOLE SODIUM 40 MG PO TBEC: 40.0000 mg | DELAYED_RELEASE_TABLET | Freq: Every day | ORAL | 1 refills | Status: DC
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
## 2023-07-30 ENCOUNTER — Encounter: Payer: Self-pay | Admitting: Internal Medicine

## 2023-07-30 NOTE — Assessment & Plan Note (Signed)
On high dose lipitor.  Has had problems with statins previously.  Also did not tolerate repatha.  Unclear if lipitor contributing to symptoms. Continue lipitor for now.  Low cholesterol diet and exercise.  Follow lipid panel and liver function tests.

## 2023-07-30 NOTE — Assessment & Plan Note (Signed)
Localized right axilla.  Stinging.  No vesicles.  Discussed shingles.  Question - infectious = localized to area with hair.  Keflex as directed.  Call with update.

## 2023-07-30 NOTE — Assessment & Plan Note (Signed)
Blood pressure as outlined. Continue metoprolol.  Follow pressures.  Check metabolic panel.

## 2023-07-30 NOTE — Assessment & Plan Note (Signed)
Chest soreness after surgery.  Incision as outlined.  Avoid heavy lifting.  Keep f/u with cardiology next week.

## 2023-07-30 NOTE — Assessment & Plan Note (Signed)
S/p removal during recent CT surgery.  Continue f/u with CTS.

## 2023-07-30 NOTE — Assessment & Plan Note (Signed)
Low carb diet and exercise.  Follow met b and a1c.  

## 2023-07-30 NOTE — Assessment & Plan Note (Signed)
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 Continue risk factor modification. Continue plavix, metoprolol and lipitor. Continue f/u with cardiology.

## 2023-07-30 NOTE — Assessment & Plan Note (Signed)
Hgb decreased during hospitalization. Check cbc, B12 and iron studies today.

## 2023-07-30 NOTE — Assessment & Plan Note (Signed)
Incision as outlined.  Does not appear to be infected.  Some minimal oozing.  Keep w/u with cardiology and CTS.

## 2023-07-30 NOTE — Assessment & Plan Note (Signed)
Increased reflux as outlined.  Off nexium since starting plavix.  Start protonix.

## 2023-08-02 ENCOUNTER — Other Ambulatory Visit: Payer: Self-pay | Admitting: Physician Assistant

## 2023-08-02 ENCOUNTER — Telehealth: Payer: Self-pay

## 2023-08-02 NOTE — Telephone Encounter (Signed)
-----   Message from Ardelle Balls sent at 07/29/2023  4:41 PM EDT ----- Regarding: RE: Pain medication refill I will refill for patient at his pharmacy. Thanks ----- Message ----- From: Steve Rattler, RN Sent: 07/29/2023   3:52 PM EDT To: Ardelle Balls, PA-C Subject: Pain medication refill                         Hey,  Patient's wife, Lynden Ang called asking if he could have a refill of Tramadol. He has not had a refill yet. Sx CABG with HL 7/17. He is taking the medication 3 times a day currently (when he first came home he was only taking 1 a day and trying to push through the pain, we advised to take when needed). It is incisional pain per wife. If you do prescribe another refill his pharmacy is:  [facility] - [place], [place] - [address]  Phone: [phone] Fax: [phone]   Thanks, Morrie Sheldon

## 2023-08-03 ENCOUNTER — Encounter: Payer: Self-pay | Admitting: Cardiology

## 2023-08-03 ENCOUNTER — Ambulatory Visit: Payer: 59 | Attending: Cardiology | Admitting: Cardiology

## 2023-08-03 VITALS — BP 110/88 | HR 131 | Ht 66.0 in | Wt 158.0 lb

## 2023-08-03 DIAGNOSIS — D151 Benign neoplasm of heart: Secondary | ICD-10-CM

## 2023-08-03 DIAGNOSIS — I2510 Atherosclerotic heart disease of native coronary artery without angina pectoris: Secondary | ICD-10-CM

## 2023-08-03 DIAGNOSIS — I4892 Unspecified atrial flutter: Secondary | ICD-10-CM | POA: Diagnosis not present

## 2023-08-03 DIAGNOSIS — Z951 Presence of aortocoronary bypass graft: Secondary | ICD-10-CM

## 2023-08-03 DIAGNOSIS — E785 Hyperlipidemia, unspecified: Secondary | ICD-10-CM

## 2023-08-03 DIAGNOSIS — I10 Essential (primary) hypertension: Secondary | ICD-10-CM

## 2023-08-03 LAB — COMPREHENSIVE METABOLIC PANEL

## 2023-08-03 LAB — CBC
Hematocrit: 39.2 % (ref 37.5–51.0)
Hemoglobin: 12.5 g/dL — ABNORMAL LOW (ref 13.0–17.7)
MCH: 28.6 pg (ref 26.6–33.0)
MCHC: 31.9 g/dL (ref 31.5–35.7)
MCV: 90 fL (ref 79–97)
Platelets: 621 10*3/uL — ABNORMAL HIGH (ref 150–450)
RBC: 4.37 x10E6/uL (ref 4.14–5.80)
RDW: 12.5 % (ref 11.6–15.4)
WBC: 7.6 10*3/uL (ref 3.4–10.8)

## 2023-08-03 LAB — TSH

## 2023-08-03 LAB — MAGNESIUM

## 2023-08-03 MED ORDER — APIXABAN 5 MG PO TABS: 5.0000 mg | ORAL_TABLET | Freq: Two times a day (BID) | ORAL | 11 refills | Status: DC

## 2023-08-03 MED ORDER — METOPROLOL TARTRATE 50 MG PO TABS: 50.0000 mg | ORAL_TABLET | Freq: Two times a day (BID) | ORAL | 3 refills | Status: DC

## 2023-08-03 MED ORDER — APIXABAN 5 MG PO TABS: 5.0000 mg | ORAL_TABLET | Freq: Two times a day (BID) | ORAL | Status: DC

## 2023-08-03 NOTE — Patient Instructions (Signed)
Medication Instructions:  Stop Toprol-XL Start Metoprolol Tartrate 50 mg ( Take 1 Tablet twice Daily). Start Eliquis 5 mg ( Take 1Tablet Twice Daily). *If you need a refill on your cardiac medications before your next appointment, please call your pharmacy*   Lab Work: CMET, CBC, Magnesium Level, TSH If you have labs (blood work) drawn today and your tests are completely normal, you will receive your results only by: MyChart Message (if you have MyChart) OR A paper copy in the mail If you have any lab test that is abnormal or we need to change your treatment, we will call you to review the results.   Testing/Procedures: No Testing   Follow-Up: At [facility], you and your health needs are our priority.  As part of our continuing mission to provide you with exceptional heart care, we have created designated Provider Care Teams.  These Care Teams include your primary Cardiologist (physician) and Advanced Practice Providers (APPs -  Physician Assistants and Nurse Practitioners) who all work together to provide you with the care you need, when you need it.  We recommend signing up for the patient portal called "MyChart".  Sign up information is provided on this After Visit Summary.  MyChart is used to connect with patients for Virtual Visits (Telemedicine).  Patients are able to view lab/test results, encounter notes, upcoming appointments, etc.  Non-urgent messages can be sent to your provider as well.   To learn more about what you can do with MyChart, go to [email or web address].    Your next appointment:   Keep Scheduled Appointment  Provider:   Joni Reining, DNP, ANP

## 2023-08-04 ENCOUNTER — Telehealth: Payer: Self-pay | Admitting: Internal Medicine

## 2023-08-04 NOTE — Telephone Encounter (Signed)
CBC was repeated yesterday. Platelet count trending down. Pt is going to see cardiology again on 8/13

## 2023-08-04 NOTE — Telephone Encounter (Signed)
Pt called in stating that he went to the heart doctor yesterday 08/03/23 and would like to give a summary/report to DR. Scott of what's going on with him after visit. He would like a call back.

## 2023-08-04 NOTE — Telephone Encounter (Signed)
Need to know how he is doing.  Have symptoms improved.  Needs a f/u cbc (dx thrombocytosis) if no labs yesterday.  Can work in tomorrow if need.

## 2023-08-04 NOTE — Telephone Encounter (Signed)
FYI for you- Patient was just calling to let us know that he was seen at cardiology yesterday and wanted to make sure you were able to see notes and labs,etc. He sees them again Tuesday.

## 2023-08-05 ENCOUNTER — Encounter: Payer: Self-pay | Admitting: Pulmonary Disease

## 2023-08-05 ENCOUNTER — Telehealth: Payer: Self-pay

## 2023-08-05 ENCOUNTER — Ambulatory Visit (INDEPENDENT_AMBULATORY_CARE_PROVIDER_SITE_OTHER): Payer: 59 | Admitting: Pulmonary Disease

## 2023-08-05 VITALS — BP 118/62 | HR 120 | Temp 97.8°F | Ht 66.0 in | Wt 159.6 lb

## 2023-08-05 DIAGNOSIS — R0683 Snoring: Secondary | ICD-10-CM | POA: Diagnosis not present

## 2023-08-05 DIAGNOSIS — R0609 Other forms of dyspnea: Secondary | ICD-10-CM

## 2023-08-05 DIAGNOSIS — I2510 Atherosclerotic heart disease of native coronary artery without angina pectoris: Secondary | ICD-10-CM

## 2023-08-05 NOTE — Patient Instructions (Addendum)
Nice to meet you  Will do a home sleep test to eval about snoring for possible sleep apnea  Will do pulmonary function test to further workup shortness of breath, however would like to give a 53-month window for the sternotomy to heal.  Will plan to do these mid-to-late October then follow-up with me afterwards to discuss results.  Return to clinic in 11 weeks or sooner as needed with Dr. Judeth Horn

## 2023-08-05 NOTE — Telephone Encounter (Signed)
Sent pt a My Chart message regarding results.

## 2023-08-05 NOTE — Progress Notes (Signed)
@Patient  ID: [name], male    DOB: [birth year], 62 y.o.   MRN: [id number]  Chief Complaint  Patient presents with   Consult    States recent card cath, bypass, appointment was scheduled in May for sob, is having A fib, snoring at night, former smoker    Referring provider: Dale Ashburn, MD  HPI:   62 y.o. man with significant CAD status post CABG 06/2023 whom we are seeing for evaluation of dyspnea on exertion.  Discharge summary reviewed.  Most recent cardiology note reviewed.  Most recent PCP note reviewed.  Most recent cardiothoracic surgery note reviewed.  Patient developed shortness of breath.  Over the last several months.  Workup including cardiology evaluation.  Underwent CT coronary 05-2023 with clear lungs but did demonstrate significant CAD with flow limitation as well as concern for left atrial mass.  Eventually underwent echocardiogram demonstrated normal EF.  Then underwent left heart cath with multivessel disease.  Recommended CABG.  Underwent CABG and left atrial myxoma resection.  Eventually discharged in the hospital.  Overall shortness of breath seems to be getting better.  Snores quite a bit.  Concerned about sleep apnea.  Reviewed most recent chest x-ray 07/22/2023.  Clear lungs.  He is a former smoker.  Questionaires / Pulmonary Flowsheets:   ACT:      No data to display          MMRC:     No data to display          Epworth:      No data to display          Tests:   FENO:  No results found for: "NITRICOXIDE"  PFT:     No data to display          WALK:      No data to display          Imaging: Personally reviewed and as per EMR and discussion in this note DG Chest 2 View  Result Date: 07/22/2023 CLINICAL DATA:  Cough and chest pain.  History of recent CABG. EXAM: CHEST - 2 VIEW COMPARISON:  07/17/2023 FINDINGS: The heart size and mediastinal contours are within normal limits. Stable appearance of sternotomy wires.  Improved atelectasis bilaterally with some mild residual atelectasis at the left lung base. There is no evidence of pulmonary edema, consolidation, pneumothorax, nodule or pleural fluid. The visualized skeletal structures are unremarkable. IMPRESSION: Improved atelectasis bilaterally with some mild residual atelectasis at the left lung base. Electronically Signed   By: Irish Lack M.D.   On: 07/22/2023 13:21   DG Chest 2 View  Result Date: 07/17/2023 CLINICAL DATA:  [id number] S/P CABG x 3 [id number] EXAM: CHEST - 2 VIEW COMPARISON:  07/15/2023 FINDINGS: Interval removal of right IJ catheter and mediastinal drains. Relatively low lung volumes with some residual basilar atelectasis or infiltrate left greater than right stable. Heart size and mediastinal contours are within normal limits. CABG markers. Small bilateral pleural effusions. Sternotomy wires. IMPRESSION: Low lung volumes with bibasilar atelectasis or infiltrate and small effusions. Electronically Signed   By: Corlis Leak M.D.   On: 07/17/2023 09:15    Lab Results: Personally reviewed CBC    Component Value Date/Time   WBC 7.6 08/03/2023 1447   WBC 12.8 (H) 07/29/2023 1205   RBC 4.37 08/03/2023 1447   RBC 4.15 (L) 07/29/2023 1205   HGB 12.5 (L) 08/03/2023 1447   HCT 39.2 08/03/2023 1447   PLT 621 (H) 08/03/2023  1447   MCV 90 08/03/2023 1447   MCH 28.6 08/03/2023 1447   MCH 29.0 07/17/2023 0712   MCHC 31.9 08/03/2023 1447   MCHC 31.9 07/29/2023 1205   RDW 12.5 08/03/2023 1447   LYMPHSABS 1.3 07/29/2023 1205   MONOABS 1.0 07/29/2023 1205   EOSABS 0.2 07/29/2023 1205   BASOSABS 0.1 07/29/2023 1205    BMET    Component Value Date/Time   NA 138 08/03/2023 1447   K 5.3 (H) 08/03/2023 1447   CL 99 08/03/2023 1447   CO2 22 08/03/2023 1447   GLUCOSE 93 08/03/2023 1447   GLUCOSE 81 07/29/2023 1205   BUN 18 08/03/2023 1447   CREATININE 0.93 08/03/2023 1447   CALCIUM 9.7 08/03/2023 1447   GFRNONAA >60 07/18/2023 0215   GFRAA >60  11/04/2018 1539    BNP No results found for: "BNP"  ProBNP No results found for: "PROBNP"  Specialty Problems       Pulmonary Problems   SOB (shortness of breath)   Snoring    Allergies  Allergen Reactions   Fish Oil Rash   Lisinopril Other (See Comments)    Fatigue   Simvastatin Rash    Immunization History  Administered Date(s) Administered   Influenza,inj,Quad PF,6+ Mos 11/04/2017, 09/15/2018, 10/19/2019, 10/03/2020, 11/12/2022   PPD Test 06/13/2012    Past Medical History:  Diagnosis Date   Anginal pain (HCC)    Chronic leg pain    s/p injury right leg (age 51)   Coronary artery disease    GERD (gastroesophageal reflux disease)    History of ankle fracture    persistent pain   History of kidney stones    Hypercholesterolemia    Hypertension    Nephrolithiasis    followed by Assunta Gambles   Vitamin D deficiency     Tobacco History: Social History   Tobacco Use  Smoking Status Former   Current packs/day: 0.00   Types: Cigarettes   Quit date: 05/21/2021   Years since quitting: 2.2  Smokeless Tobacco Former   Counseling given: Not Answered   Continue to not smoke  Outpatient Encounter Medications as of 08/05/2023  Medication Sig   acetaminophen (TYLENOL) 500 MG tablet Take 1,000 mg by mouth every 8 (eight) hours as needed for headache.   apixaban (ELIQUIS) 5 MG TABS tablet Take 1 tablet (5 mg total) by mouth 2 (two) times daily.   apixaban (ELIQUIS) 5 MG TABS tablet Take 1 tablet (5 mg total) by mouth 2 (two) times daily. (Patient not taking: Reported on 08/09/2023)   aspirin EC 81 MG tablet Take 1 tablet (81 mg total) by mouth daily. Swallow whole.   atorvastatin (LIPITOR) 80 MG tablet Take 1 tablet (80 mg total) by mouth daily.   calcium carbonate (TUMS - DOSED IN MG ELEMENTAL CALCIUM) 500 MG chewable tablet Chew 1 tablet by mouth continuous as needed for indigestion or heartburn.   cephALEXin (KEFLEX) 500 MG capsule Take 1 capsule (500 mg total) by  mouth 3 (three) times daily. (Patient not taking: Reported on 08/09/2023)   clopidogrel (PLAVIX) 75 MG tablet Take 1 tablet (75 mg total) by mouth daily.   metoprolol tartrate (LOPRESSOR) 50 MG tablet Take 1 tablet (50 mg total) by mouth 2 (two) times daily.   pantoprazole (PROTONIX) 40 MG tablet Take 1 tablet (40 mg total) by mouth daily. Take 30 minutes before your evening meal   traMADol (ULTRAM) 50 MG tablet Take 1 tablet (50 mg total) by mouth every 6 (six)  hours as needed for severe pain.   No facility-administered encounter medications on file as of 08/05/2023.     Review of Systems  Review of Systems  No chest pain with exertion.  No orthopnea or PND.  Comprehensive review of systems otherwise negative. Physical Exam  BP 118/62 (BP Location: Left Arm, Patient Position: Sitting, Cuff Size: Normal)   Pulse (!) 120   Temp 97.8 F (36.6 C) (Oral)   Ht 5\' 6"  (1.676 m)   Wt 159 lb 9.6 oz (72.4 kg)   SpO2 97%   BMI 25.76 kg/m   Wt Readings from Last 5 Encounters:  08/09/23 160 lb 3.2 oz (72.7 kg)  08/05/23 159 lb 9.6 oz (72.4 kg)  08/03/23 158 lb (71.7 kg)  07/29/23 156 lb (70.8 kg)  07/18/23 165 lb 14.4 oz (75.3 kg)    BMI Readings from Last 5 Encounters:  08/09/23 25.86 kg/m  08/05/23 25.76 kg/m  08/03/23 25.50 kg/m  07/29/23 25.18 kg/m  07/18/23 26.78 kg/m     Physical Exam General: Sitting in chair, no acute distress Eyes: EOMI, no icterus Neck: Supple, no JVP appreciated Cardiovascular: Warm, no edema Pulmonary: Clear, no work of breathing Abdomen: Nondistended, bowel sounds present MSK: No synovitis, no joint effusion Neuro: Normal gait, no weakness Psych: Normal mood, full affect   Assessment & Plan:   Dyspnea on exertion: Marked improvement after CABG, likely mostly due to CAD.  Atrial fibrillation could be contributing as well.  History of smoking.  PFTs for further evaluation.  Will need time to heal from recent sternotomy, plan PFTs about 3  months after surgery.  Sleep disordered breathing: Loud snoring etc.  Some concern for sleep apnea raised by partner.  Home sleep test ordered for further evaluation.   Return in about 11 weeks (around 10/21/2023) for f/u Dr. Judeth Horn, after PFT.   Karren Burly, MD 08/15/2023   This appointment required 60 minutes of patient care (this includes precharting, chart review, review of results, face-to-face care, etc.).
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
## 2023-08-09 ENCOUNTER — Ambulatory Visit: Payer: 59 | Attending: Adult Health | Admitting: Adult Health

## 2023-08-09 ENCOUNTER — Encounter: Payer: Self-pay | Admitting: Adult Health

## 2023-08-09 VITALS — BP 110/82 | HR 112 | Ht 66.0 in | Wt 160.2 lb

## 2023-08-09 DIAGNOSIS — E7800 Pure hypercholesterolemia, unspecified: Secondary | ICD-10-CM

## 2023-08-09 DIAGNOSIS — I2510 Atherosclerotic heart disease of native coronary artery without angina pectoris: Secondary | ICD-10-CM

## 2023-08-09 DIAGNOSIS — I4892 Unspecified atrial flutter: Secondary | ICD-10-CM

## 2023-08-09 DIAGNOSIS — Z951 Presence of aortocoronary bypass graft: Secondary | ICD-10-CM

## 2023-08-09 NOTE — Progress Notes (Addendum)
[address]       [place] [address]             [phone]   HPI: Patient returns for routine postoperative follow-up having undergone  CABG X 3 (LIMA to LAD, reverse saphenous vein graft to the PDA, and Obtuse Marginal),  resection left atrial myxoma, and resection of chest wall cyst (pathology showed benign epidermal cyst) by Dr. Cliffton Asters on 07/13/2023. He was started on Plavix on post-op day 1 for small venous and arterial conduits available for grafting.  He was discharged in stable condition on 07/18/2023. He denies shortness of breath or chest pain.   Current Outpatient Medications  Medication Sig Dispense Refill   acetaminophen (TYLENOL) 500 MG tablet Take 1,000 mg by mouth every 8 (eight) hours as needed for headache.     apixaban (ELIQUIS) 5 MG TABS tablet Take 1 tablet (5 mg total) by mouth 2 (two) times daily. 60 tablet 11   apixaban (ELIQUIS) 5 MG TABS tablet Take 1 tablet (5 mg total) by mouth 2 (two) times daily. (Patient not taking: Reported on 08/09/2023)     aspirin EC 81 MG tablet Take 1 tablet (81 mg total) by mouth daily. Swallow whole. 90 tablet 3   atorvastatin (LIPITOR) 80 MG tablet Take 1 tablet (80 mg total) by mouth daily. 30 tablet 11   calcium carbonate (TUMS - DOSED IN MG ELEMENTAL CALCIUM) 500 MG chewable tablet Chew 1 tablet by mouth continuous as needed for indigestion or heartburn.     cephALEXin (KEFLEX) 500 MG capsule Take 1 capsule (500 mg total) by mouth 3 (three) times daily. (Patient not taking: Reported on 08/09/2023) 21 capsule 0   clopidogrel (PLAVIX) 75 MG tablet Take 1 tablet (75 mg total) by mouth daily. 30 tablet 1   metoprolol tartrate (LOPRESSOR) 50 MG tablet Take 1 tablet (50 mg total) by mouth 2 (two) times daily. 180 tablet 3   pantoprazole (PROTONIX) 40 MG tablet Take 1 tablet (40 mg total) by mouth daily. Take 30 minutes before your evening meal 30 tablet 1   traMADol (ULTRAM) 50 MG tablet Take 1 tablet (50 mg total)  by mouth every 6 (six) hours as needed for severe pain. 28 tablet 0  Vital Signs: Vitals:   08/18/23 1423  BP: 108/76  Pulse: 87  Resp: 20  SpO2: 96%    Physical Exam: CV-RRR Pulmonary-Clear to auscultation bilaterally Extremities-No LE edema Wounds-Sternal and bilateral LE wounds are clean, dry well healed  Diagnostic Tests: Narrative & Impression  CLINICAL DATA:  History of CABG   EXAM: CHEST - 2 VIEW   COMPARISON:  07/22/2023   FINDINGS: The heart size and mediastinal contours are within normal limits. Prior sternotomy and CABG. Both lungs are clear. No pneumothorax. The visualized skeletal structures are unremarkable.   IMPRESSION: No active cardiopulmonary disease.     Electronically Signed   By: Duanne Guess D.O.   On: 08/18/2023 13:59      Impression and Plan: He was seen by cardiology NP on 08/07 and on EKG wound in 2:1 a flutter. He was started on Apixaban 5 mg bid and Toprol XL was changed to Lopressor 50 mg bid. He was seen again by cardiology NP on 08/09/2023. EKG done showed junctional rhythm, not a flutter. Patient admitted to a lot of caffeine use. The importance of limiting this was discussed. Lopressor was not increased on this visit secondary to BP. Hopefully, Apixaban will be stopped  at next office visit with cardiology. Lipid panel and LFTS will be checked in 3 months. Per wife, patient using little caffeine now. He was also seen by pulmonary on 08/04/2023 for home sleep test to evaluate for OSA. We discussed the results of today's PA/LAT CXR, driving, participation in cardiac rehab (he was referred to [facility]), and sternal precautions. Of note, he did request one more refill on Ultram and this prescription was sent to [facility]. He was informed he will receive no more refills. He was also instructed he is to not take Ultram during the day if he wants to drive and he is agreeable. He was instructed he can ride his lawnmower after  Labor Day.His wife inquired if he is able to stop baby ec asa as also on Plavix and Apixaban. We will defer to cardiology as patient is on ec asa for CAD, s/p CABG. He will follow up with [facility] PRN and continue to be followed by cardiology.   Ardelle Balls, PA-C [facility] [phone]

## 2023-08-09 NOTE — Patient Instructions (Addendum)
Medication Instructions:  No changes *If you need a refill on your cardiac medications before your next appointment, please call your pharmacy*   Lab Work: No Labs If you have labs (blood work) drawn today and your tests are completely normal, you will receive your results only by: MyChart Message (if you have MyChart) OR A paper copy in the mail If you have any lab test that is abnormal or we need to change your treatment, we will call you to review the results.   Testing/Procedures: No Testing   Follow-Up: At [facility], you and your health needs are our priority.  As part of our continuing mission to provide you with exceptional heart care, we have created designated Provider Care Teams.  These Care Teams include your primary Cardiologist (physician) and Advanced Practice Providers (APPs -  Physician Assistants and Nurse Practitioners) who all work together to provide you with the care you need, when you need it.  We recommend signing up for the patient portal called "MyChart".  Sign up information is provided on this After Visit Summary.  MyChart is used to connect with patients for Virtual Visits (Telemedicine).  Patients are able to view lab/test results, encounter notes, upcoming appointments, etc.  Non-urgent messages can be sent to your provider as well.   To learn more about what you can do with MyChart, go to [email or web address].    Your next appointment:   1 month(s)  Provider:   Robet Leu, PA-C or, Little Ishikawa, MD   Other Instructions No  caffeine.

## 2023-08-11 ENCOUNTER — Encounter (INDEPENDENT_AMBULATORY_CARE_PROVIDER_SITE_OTHER): Payer: Self-pay

## 2023-08-15 ENCOUNTER — Telehealth: Payer: Self-pay

## 2023-08-15 NOTE — Telephone Encounter (Signed)
Received Phase II Cardiac Rehab Referral form for completion.  Per 07/20/23 note from CT Surgeon pt to be cleared at next visit.  Called pt he is aware of his appointment 08/18/23 with CT surgeon office.  Let him know that Dr. Lorin Picket is out of the office this week and will review next week.  Pt verbalized understanding.  Form placed back in black tray.

## 2023-08-17 ENCOUNTER — Other Ambulatory Visit: Payer: Self-pay | Admitting: Thoracic Surgery (Cardiothoracic Vascular Surgery)

## 2023-08-17 DIAGNOSIS — Z951 Presence of aortocoronary bypass graft: Secondary | ICD-10-CM

## 2023-08-18 ENCOUNTER — Ambulatory Visit
Admission: RE | Admit: 2023-08-18 | Discharge: 2023-08-18 | Disposition: A | Discharge status: Home / Self Care | Payer: 59 | Source: Ambulatory Visit | Attending: Thoracic Surgery (Cardiothoracic Vascular Surgery) | Admitting: Thoracic Surgery (Cardiothoracic Vascular Surgery)

## 2023-08-18 ENCOUNTER — Encounter: Payer: Self-pay | Admitting: Physician Assistant

## 2023-08-18 ENCOUNTER — Ambulatory Visit (INDEPENDENT_AMBULATORY_CARE_PROVIDER_SITE_OTHER): Payer: Self-pay | Admitting: Physician Assistant

## 2023-08-18 ENCOUNTER — Other Ambulatory Visit: Payer: Self-pay

## 2023-08-18 VITALS — BP 108/76 | HR 87 | Resp 20 | Ht 66.0 in | Wt 160.6 lb

## 2023-08-18 DIAGNOSIS — Z951 Presence of aortocoronary bypass graft: Secondary | ICD-10-CM

## 2023-08-18 NOTE — Progress Notes (Signed)
Referral placed to outpatient Cardiac Rehabilitation per [place], [place]. Referral placed to [facility], [facility] per patient request.

## 2023-08-18 NOTE — Patient Instructions (Addendum)
The patient states he is not taking any narcotic for pain. As a result, he was instructed it is ok to begin driving short distances (I.e. no more than 30 minutes during the day) for the next several days. Increase the frequency and duration as tolerates.   Also, sternal precautions (no more than lifting 10 pounds) are to continue until 09/13/2023.   Participation in cardiac rehab was highly encouraged.

## 2023-08-25 ENCOUNTER — Other Ambulatory Visit: Payer: Self-pay

## 2023-08-25 ENCOUNTER — Encounter: Payer: 59 | Attending: Family Medicine

## 2023-08-25 DIAGNOSIS — Z951 Presence of aortocoronary bypass graft: Secondary | ICD-10-CM

## 2023-08-25 NOTE — Progress Notes (Signed)
Virtual Visit completed. Patient informed on EP and RD appointment and 6 Minute walk test. Patient also informed of patient health questionnaires on My Chart. Patient Verbalizes understanding. Visit diagnosis can be found in [facility] 07/13/2023.

## 2023-09-03 ENCOUNTER — Other Ambulatory Visit: Payer: Self-pay | Admitting: Cardiovascular Disease

## 2023-09-06 ENCOUNTER — Encounter: Payer: 59 | Attending: Family Medicine | Admitting: *Deleted

## 2023-09-06 VITALS — Ht 65.0 in | Wt 163.5 lb

## 2023-09-06 DIAGNOSIS — Z951 Presence of aortocoronary bypass graft: Secondary | ICD-10-CM | POA: Diagnosis present

## 2023-09-06 DIAGNOSIS — Z723 Lack of physical exercise: Secondary | ICD-10-CM | POA: Diagnosis not present

## 2023-09-06 DIAGNOSIS — Z48812 Encounter for surgical aftercare following surgery on the circulatory system: Secondary | ICD-10-CM | POA: Insufficient documentation

## 2023-09-06 NOTE — Patient Instructions (Signed)
Patient Instructions  Patient Details  Name: Thomas Harper MRN: [id number] Date of Birth: 01-25-1961 Referring Provider:  Glori Luis, MD  Below are your personal goals for exercise, nutrition, and risk factors. Our goal is to help you stay on track towards obtaining and maintaining these goals. We will be discussing your progress on these goals with you throughout the program.  Initial Exercise Prescription:  Initial Exercise Prescription - 09/06/23 1500       Date of Initial Exercise RX and Referring Provider   Date 09/06/23    Referring Provider Dr. Marikay Alar      Oxygen   Maintain Oxygen Saturation 88% or higher      Treadmill   MPH 2.3    Grade 0    Minutes 15    METs 2.76      NuStep   Level 2    Minutes 15    METs 3.1      REL-XR   Level 2    Minutes 15    METs 3.1      T5 Nustep   Level 2    Minutes 15    METs 3.1      Biostep-RELP   Level 2    Minutes 15    METs 3.1      Prescription Details   Duration Progress to 30 minutes of continuous aerobic without signs/symptoms of physical distress      Intensity   THRR 40-80% of Max Heartrate 106-140    Ratings of Perceived Exertion 11-13    Perceived Dyspnea 0-4      Progression   Progression Continue to progress workloads to maintain intensity without signs/symptoms of physical distress.      Resistance Training   Training Prescription Yes    Weight 5lb    Reps 10-15             Exercise Goals: Frequency: Be able to perform aerobic exercise two to three times per week in program working toward 2-5 days per week of home exercise.  Intensity: Work with a perceived exertion of 11 (fairly light) - 15 (hard) while following your exercise prescription.  We will make changes to your prescription with you as you progress through the program.   Duration: Be able to do 30 to 45 minutes of continuous aerobic exercise in addition to a 5 minute warm-up and a 5 minute cool-down routine.    Nutrition Goals: Your personal nutrition goals will be established when you do your nutrition analysis with the dietician.  The following are general nutrition guidelines to follow: Cholesterol < 200mg /day Sodium < 1500mg /day Fiber: Men over 50 yrs - 30 grams per day  Personal Goals:  Personal Goals and Risk Factors at Admission - 09/06/23 1556       Core Components/Risk Factors/Patient Goals on Admission    Weight Management Yes;Weight Maintenance    Intervention Weight Management: Develop a combined nutrition and exercise program designed to reach desired caloric intake, while maintaining appropriate intake of nutrient and fiber, sodium and fats, and appropriate energy expenditure required for the weight goal.;Weight Management: Provide education and appropriate resources to help participant work on and attain dietary goals.;Weight Management/Obesity: Establish reasonable short term and long term weight goals.    Admit Weight 163 lb 8 oz (74.2 kg)    Goal Weight: Short Term 160 lb (72.6 kg)    Goal Weight: Long Term 155 lb (70.3 kg)    Expected Outcomes Short Term: Continue to  assess and modify interventions until short term weight is achieved;Long Term: Adherence to nutrition and physical activity/exercise program aimed toward attainment of established weight goal;Weight Maintenance: Understanding of the daily nutrition guidelines, which includes 25-35% calories from fat, 7% or less cal from saturated fats, less than 200mg  cholesterol, less than 1.5gm of sodium, & 5 or more servings of fruits and vegetables daily;Understanding recommendations for meals to include 15-35% energy as protein, 25-35% energy from fat, 35-60% energy from carbohydrates, less than 200mg  of dietary cholesterol, 20-35 gm of total fiber daily;Understanding of distribution of calorie intake throughout the day with the consumption of 4-5 meals/snacks    Hypertension Yes    Intervention Provide education on lifestyle  modifcations including regular physical activity/exercise, weight management, moderate sodium restriction and increased consumption of fresh fruit, vegetables, and low fat dairy, alcohol moderation, and smoking cessation.;Monitor prescription use compliance.    Expected Outcomes Short Term: Continued assessment and intervention until BP is < 140/8mm HG in hypertensive participants. < 130/39mm HG in hypertensive participants with diabetes, heart failure or chronic kidney disease.;Long Term: Maintenance of blood pressure at goal levels.            Exercise Goals and Review:  Exercise Goals     Row Name 09/06/23 1552             Exercise Goals   Increase Physical Activity Yes       Intervention Provide advice, education, support and counseling about physical activity/exercise needs.;Develop an individualized exercise prescription for aerobic and resistive training based on initial evaluation findings, risk stratification, comorbidities and participant's personal goals.       Expected Outcomes Long Term: Exercising regularly at least 3-5 days a week.;Long Term: Add in home exercise to make exercise part of routine and to increase amount of physical activity.;Short Term: Attend rehab on a regular basis to increase amount of physical activity.       Increase Strength and Stamina Yes       Intervention Provide advice, education, support and counseling about physical activity/exercise needs.;Develop an individualized exercise prescription for aerobic and resistive training based on initial evaluation findings, risk stratification, comorbidities and participant's personal goals.       Expected Outcomes Long Term: Improve cardiorespiratory fitness, muscular endurance and strength as measured by increased METs and functional capacity ( );Short Term: Perform resistance training exercises routinely during rehab and add in resistance training at home;Short Term: Increase workloads from initial exercise  prescription for resistance, speed, and METs.       Able to understand and use rate of perceived exertion (RPE) scale Yes       Intervention Provide education and explanation on how to use RPE scale       Expected Outcomes Long Term:  Able to use RPE to guide intensity level when exercising independently;Short Term: Able to use RPE daily in rehab to express subjective intensity level       Able to understand and use Dyspnea scale Yes       Intervention Provide education and explanation on how to use Dyspnea scale       Expected Outcomes Long Term: Able to use Dyspnea scale to guide intensity level when exercising independently;Short Term: Able to use Dyspnea scale daily in rehab to express subjective sense of shortness of breath during exertion       Knowledge and understanding of Target Heart Rate Range (THRR) Yes       Intervention Provide education and explanation of THRR including  how the numbers were predicted and where they are located for reference       Expected Outcomes Long Term: Able to use THRR to govern intensity when exercising independently;Short Term: Able to use daily as guideline for intensity in rehab;Short Term: Able to state/look up THRR       Able to check pulse independently Yes       Intervention Review the importance of being able to check your own pulse for safety during independent exercise;Provide education and demonstration on how to check pulse in carotid and radial arteries.       Expected Outcomes Long Term: Able to check pulse independently and accurately;Short Term: Able to explain why pulse checking is important during independent exercise       Understanding of Exercise Prescription Yes       Intervention Provide education, explanation, and written materials on patient's individual exercise prescription       Expected Outcomes Short Term: Able to explain program exercise prescription;Long Term: Able to explain home exercise prescription to exercise independently               Initial ITP Copy of goals given to participant.

## 2023-09-06 NOTE — Progress Notes (Signed)
Patient Instructions  Patient Details  Name: Thomas Harper MRN: [id number] Date of Birth: 1961-07-19 Referring Provider:  Glori Luis, MD  Below are your personal goals for exercise, nutrition, and risk factors. Our goal is to help you stay on track towards obtaining and maintaining these goals. We will be discussing your progress on these goals with you throughout the program.  Initial Exercise Prescription:  Initial Exercise Prescription - 09/06/23 1500       Date of Initial Exercise RX and Referring Provider   Date 09/06/23    Referring Provider Dr. Marikay Alar      Oxygen   Maintain Oxygen Saturation 88% or higher      Treadmill   MPH 2.3    Grade 0    Minutes 15    METs 2.76      NuStep   Level 2    Minutes 15    METs 3.1      REL-XR   Level 2    Minutes 15    METs 3.1      T5 Nustep   Level 2    Minutes 15    METs 3.1      Biostep-RELP   Level 2    Minutes 15    METs 3.1      Prescription Details   Duration Progress to 30 minutes of continuous aerobic without signs/symptoms of physical distress      Intensity   THRR 40-80% of Max Heartrate 106-140    Ratings of Perceived Exertion 11-13    Perceived Dyspnea 0-4      Progression   Progression Continue to progress workloads to maintain intensity without signs/symptoms of physical distress.      Resistance Training   Training Prescription Yes    Weight 5lb    Reps 10-15             Exercise Goals: Frequency: Be able to perform aerobic exercise two to three times per week in program working toward 2-5 days per week of home exercise.  Intensity: Work with a perceived exertion of 11 (fairly light) - 15 (hard) while following your exercise prescription.  We will make changes to your prescription with you as you progress through the program.   Duration: Be able to do 30 to 45 minutes of continuous aerobic exercise in addition to a 5 minute warm-up and a 5 minute cool-down routine.    Nutrition Goals: Your personal nutrition goals will be established when you do your nutrition analysis with the dietician.  The following are general nutrition guidelines to follow: Cholesterol < 200mg /day Sodium < 1500mg /day Fiber: Men over 50 yrs - 30 grams per day  Personal Goals:  Personal Goals and Risk Factors at Admission - 09/06/23 1556       Core Components/Risk Factors/Patient Goals on Admission    Weight Management Yes;Weight Maintenance    Intervention Weight Management: Develop a combined nutrition and exercise program designed to reach desired caloric intake, while maintaining appropriate intake of nutrient and fiber, sodium and fats, and appropriate energy expenditure required for the weight goal.;Weight Management: Provide education and appropriate resources to help participant work on and attain dietary goals.;Weight Management/Obesity: Establish reasonable short term and long term weight goals.    Admit Weight 163 lb 8 oz (74.2 kg)    Goal Weight: Short Term 160 lb (72.6 kg)    Goal Weight: Long Term 155 lb (70.3 kg)    Expected Outcomes Short Term: Continue to  assess and modify interventions until short term weight is achieved;Long Term: Adherence to nutrition and physical activity/exercise program aimed toward attainment of established weight goal;Weight Maintenance: Understanding of the daily nutrition guidelines, which includes 25-35% calories from fat, 7% or less cal from saturated fats, less than 200mg  cholesterol, less than 1.5gm of sodium, & 5 or more servings of fruits and vegetables daily;Understanding recommendations for meals to include 15-35% energy as protein, 25-35% energy from fat, 35-60% energy from carbohydrates, less than 200mg  of dietary cholesterol, 20-35 gm of total fiber daily;Understanding of distribution of calorie intake throughout the day with the consumption of 4-5 meals/snacks    Hypertension Yes    Intervention Provide education on lifestyle  modifcations including regular physical activity/exercise, weight management, moderate sodium restriction and increased consumption of fresh fruit, vegetables, and low fat dairy, alcohol moderation, and smoking cessation.;Monitor prescription use compliance.    Expected Outcomes Short Term: Continued assessment and intervention until BP is < 140/52mm HG in hypertensive participants. < 130/58mm HG in hypertensive participants with diabetes, heart failure or chronic kidney disease.;Long Term: Maintenance of blood pressure at goal levels.             Tobacco Use Initial Evaluation: Social History   Tobacco Use  Smoking Status Former   Current packs/day: 0.00   Types: Cigarettes   Quit date: 05/21/2021   Years since quitting: 2.2  Smokeless Tobacco Former    Exercise Goals and Review:  Exercise Goals     Row Name 09/06/23 1552             Exercise Goals   Increase Physical Activity Yes       Intervention Provide advice, education, support and counseling about physical activity/exercise needs.;Develop an individualized exercise prescription for aerobic and resistive training based on initial evaluation findings, risk stratification, comorbidities and participant's personal goals.       Expected Outcomes Long Term: Exercising regularly at least 3-5 days a week.;Long Term: Add in home exercise to make exercise part of routine and to increase amount of physical activity.;Short Term: Attend rehab on a regular basis to increase amount of physical activity.       Increase Strength and Stamina Yes       Intervention Provide advice, education, support and counseling about physical activity/exercise needs.;Develop an individualized exercise prescription for aerobic and resistive training based on initial evaluation findings, risk stratification, comorbidities and participant's personal goals.       Expected Outcomes Long Term: Improve cardiorespiratory fitness, muscular endurance and strength as  measured by increased METs and functional capacity ( );Short Term: Perform resistance training exercises routinely during rehab and add in resistance training at home;Short Term: Increase workloads from initial exercise prescription for resistance, speed, and METs.       Able to understand and use rate of perceived exertion (RPE) scale Yes       Intervention Provide education and explanation on how to use RPE scale       Expected Outcomes Long Term:  Able to use RPE to guide intensity level when exercising independently;Short Term: Able to use RPE daily in rehab to express subjective intensity level       Able to understand and use Dyspnea scale Yes       Intervention Provide education and explanation on how to use Dyspnea scale       Expected Outcomes Long Term: Able to use Dyspnea scale to guide intensity level when exercising independently;Short Term: Able to use Dyspnea scale daily  in rehab to express subjective sense of shortness of breath during exertion       Knowledge and understanding of Target Heart Rate Range (THRR) Yes       Intervention Provide education and explanation of THRR including how the numbers were predicted and where they are located for reference       Expected Outcomes Long Term: Able to use THRR to govern intensity when exercising independently;Short Term: Able to use daily as guideline for intensity in rehab;Short Term: Able to state/look up THRR       Able to check pulse independently Yes       Intervention Review the importance of being able to check your own pulse for safety during independent exercise;Provide education and demonstration on how to check pulse in carotid and radial arteries.       Expected Outcomes Long Term: Able to check pulse independently and accurately;Short Term: Able to explain why pulse checking is important during independent exercise       Understanding of Exercise Prescription Yes       Intervention Provide education, explanation, and written  materials on patient's individual exercise prescription       Expected Outcomes Short Term: Able to explain program exercise prescription;Long Term: Able to explain home exercise prescription to exercise independently              Initial ITP Copy of goals given to participant.

## 2023-09-08 ENCOUNTER — Other Ambulatory Visit: Payer: Self-pay | Admitting: Physician Assistant

## 2023-09-09 ENCOUNTER — Encounter: Payer: 59 | Admitting: *Deleted

## 2023-09-09 DIAGNOSIS — Z951 Presence of aortocoronary bypass graft: Secondary | ICD-10-CM

## 2023-09-09 DIAGNOSIS — Z48812 Encounter for surgical aftercare following surgery on the circulatory system: Secondary | ICD-10-CM | POA: Diagnosis not present

## 2023-09-09 NOTE — Progress Notes (Signed)
Daily Session Note  Patient Details  Name: Thomas Harper MRN: [id number] Date of Birth: 18-Oct-1961 Referring Provider:   Flowsheet Row Cardiac Rehab from 09/06/2023 in [facility]  Referring Provider Dr. Marikay Alar       Encounter Date: 09/09/2023  Check In:  Session Check In - 09/09/23 1119       Check-In   Supervising physician immediately available to respond to emergencies See telemetry face sheet for immediately available ER MD    Location [facility]-Cardiac & Pulmonary Rehab    Staff Present Elige Ko, RCP,RRT,BSRT;Noah Tickle, BS, Exercise Physiologist;Liona Wengert, RN, BSN, CCRP    Virtual Visit No    Medication changes reported     No    Fall or balance concerns reported    No    Warm-up and Cool-down Performed on first and last piece of equipment    Resistance Training Performed Yes    VAD Patient? No    PAD/SET Patient? No      Pain Assessment   Currently in Pain? No/denies                Social History   Tobacco Use  Smoking Status Former   Current packs/day: 0.00   Types: Cigarettes   Quit date: 05/21/2021   Years since quitting: 2.3  Smokeless Tobacco Former    Goals Met:  Exercise tolerated well Personal goals reviewed No report of concerns or symptoms today  Goals Unmet:  Not Applicable  Comments: First full day of exercise!  Patient was oriented to gym and equipment including functions, settings, policies, and procedures.  Patient's individual exercise prescription and treatment plan were reviewed.  All starting workloads were established based on the results of the 6 minute walk test done at initial orientation visit.  The plan for exercise progression was also introduced and progression will be customized based on patient's performance and goals.    Dr. Bethann Punches is Medical Director for [facility] Cardiac Rehabilitation.  Dr. Vida Rigger is Medical Director for [facility] Pulmonary Rehabilitation.

## 2023-09-10 NOTE — Progress Notes (Unsigned)
50-55% by visual estimate.   Recommend Aspirin 81mg  daily for moderate CAD.  Multivessel CAD with high-grade noncalcified mid 50 to focally 90% LAD stenosis,, diffuse moderate circumflex disease with 75% focal stenosis after marginal vessel, and subtotal mid RCA stenosis with both bridging right to right and left-to-right collaterals supplying a large PDA PLA system.  Low normal LV function with EF estimated at 50 to 55% with subtle focal mid anterolateral wall motion abnormality;  LVEDP 12 mm.  RECOMMENDATION: Plan for outpatient surgical consultation in this patient with left atrial myxoma and multivessel CAD.  He has been on amlodipine.  Will initiate beta-blocker and nitrate therapy.  Will also change lovastatin to high potency atorvastatin 80 mg.  Findings Coronary Findings Diagnostic  Dominance: Right  Left Anterior Descending Mid LAD-1 lesion is 20% stenosed. Mid LAD-2 lesion is 50% stenosed. Mid LAD to Dist LAD lesion is 90% stenosed.  Third Septal Branch Vessel is small in size.  Left Circumflex Prox Cx lesion is 20% stenosed. Prox Cx to Mid Cx lesion is 40% stenosed. Mid Cx lesion is 70% stenosed. Mid Cx to Dist Cx lesion is 75% stenosed.  Fourth Obtuse Marginal Branch Vessel is small in size.  Right Coronary Artery Prox RCA to Mid RCA lesion is 99% stenosed.  Right Ventricular Branch Vessel is small in size.  Right Posterior Atrioventricular Artery Collaterals RPAV filled by collaterals from RV Branch.  Third Right Posterolateral Branch Collaterals 3rd RPL filled by collaterals from 1st Mrg.  Collaterals 3rd RPL filled by collaterals from 2nd Sept.  Intervention  No interventions have been documented.     ECHOCARDIOGRAM  ECHOCARDIOGRAM COMPLETE 06/08/2023  Narrative ECHOCARDIOGRAM REPORT    Patient Name:   Thomas Harper Date of Exam: 06/08/2023 Medical Rec #:  [id number]       Height:       66.0 in Accession #:    [id number]      Weight:       166.2 lb Date of Birth:  06-25-1961       BSA:          1.848 m Patient Age:    62 years        BP:           132/86 mmHg Patient Gender: M               HR:           72 bpm. Exam Location:  [facility]  Procedure: 2D Echo, 3D Echo, Color Doppler, Cardiac Doppler and Strain Analysis  Indications:    R06.9 DOE; R07.2 Precordial pain  History:        Patient has no prior history of Echocardiogram examinations. CAD, Signs/Symptoms:Chest Pain and Dyspnea;  Risk Factors:Hypertension, Dyslipidemia, Former Smoker and Family History of Coronary Artery Disease. Patient complains of chest discomfort with DOE. He denies leg edema.  Sonographer:    Carlos American RVT, RDCS (AE), RDMS Referring Phys: [phone] CHRISTOPHER L SCHUMANN  IMPRESSIONS   1. Left ventricular ejection fraction, by estimation, is 60 to 65%. The left ventricle has normal function. The left ventricle has no regional wall motion abnormalities. Left ventricular diastolic parameters are consistent with Grade I diastolic dysfunction (impaired relaxation). 2. Right ventricular systolic function is normal. The right ventricular size is normal. 3. Mass measuring 22x16 mm attached to intraatrial sepum on the left atrial side noted. Differencial should include myxoma - most likely v/s thrombus. 4. The mitral valve is normal in structure.  50-55% by visual estimate.   Recommend Aspirin 81mg  daily for moderate CAD.  Multivessel CAD with high-grade noncalcified mid 50 to focally 90% LAD stenosis,, diffuse moderate circumflex disease with 75% focal stenosis after marginal vessel, and subtotal mid RCA stenosis with both bridging right to right and left-to-right collaterals supplying a large PDA PLA system.  Low normal LV function with EF estimated at 50 to 55% with subtle focal mid anterolateral wall motion abnormality;  LVEDP 12 mm.  RECOMMENDATION: Plan for outpatient surgical consultation in this patient with left atrial myxoma and multivessel CAD.  He has been on amlodipine.  Will initiate beta-blocker and nitrate therapy.  Will also change lovastatin to high potency atorvastatin 80 mg.  Findings Coronary Findings Diagnostic  Dominance: Right  Left Anterior Descending Mid LAD-1 lesion is 20% stenosed. Mid LAD-2 lesion is 50% stenosed. Mid LAD to Dist LAD lesion is 90% stenosed.  Third Septal Branch Vessel is small in size.  Left Circumflex Prox Cx lesion is 20% stenosed. Prox Cx to Mid Cx lesion is 40% stenosed. Mid Cx lesion is 70% stenosed. Mid Cx to Dist Cx lesion is 75% stenosed.  Fourth Obtuse Marginal Branch Vessel is small in size.  Right Coronary Artery Prox RCA to Mid RCA lesion is 99% stenosed.  Right Ventricular Branch Vessel is small in size.  Right Posterior Atrioventricular Artery Collaterals RPAV filled by collaterals from RV Branch.  Third Right Posterolateral Branch Collaterals 3rd RPL filled by collaterals from 1st Mrg.  Collaterals 3rd RPL filled by collaterals from 2nd Sept.  Intervention  No interventions have been documented.     ECHOCARDIOGRAM  ECHOCARDIOGRAM COMPLETE 06/08/2023  Narrative ECHOCARDIOGRAM REPORT    Patient Name:   Thomas Harper Date of Exam: 06/08/2023 Medical Rec #:  [id number]       Height:       66.0 in Accession #:    [id number]      Weight:       166.2 lb Date of Birth:  06-25-1961       BSA:          1.848 m Patient Age:    62 years        BP:           132/86 mmHg Patient Gender: M               HR:           72 bpm. Exam Location:  [facility]  Procedure: 2D Echo, 3D Echo, Color Doppler, Cardiac Doppler and Strain Analysis  Indications:    R06.9 DOE; R07.2 Precordial pain  History:        Patient has no prior history of Echocardiogram examinations. CAD, Signs/Symptoms:Chest Pain and Dyspnea;  Risk Factors:Hypertension, Dyslipidemia, Former Smoker and Family History of Coronary Artery Disease. Patient complains of chest discomfort with DOE. He denies leg edema.  Sonographer:    Carlos American RVT, RDCS (AE), RDMS Referring Phys: [phone] CHRISTOPHER L SCHUMANN  IMPRESSIONS   1. Left ventricular ejection fraction, by estimation, is 60 to 65%. The left ventricle has normal function. The left ventricle has no regional wall motion abnormalities. Left ventricular diastolic parameters are consistent with Grade I diastolic dysfunction (impaired relaxation). 2. Right ventricular systolic function is normal. The right ventricular size is normal. 3. Mass measuring 22x16 mm attached to intraatrial sepum on the left atrial side noted. Differencial should include myxoma - most likely v/s thrombus. 4. The mitral valve is normal in structure.  cm/m   RA Area:     16.50 cm LA Vol  (A2C):   41.3 ml 22.34 ml/m  RA Volume:   45.40 ml  24.56 ml/m LA Vol (A4C):   48.2 ml 26.08 ml/m LA Biplane Vol: 44.8 ml 24.24 ml/m AORTIC VALVE                    PULMONIC VALVE AV Area (Vmax):    2.36 cm     PV Vmax:       0.80 m/s AV Area (Vmean):   2.53 cm     PV Peak grad:  2.6 mmHg AV Area (VTI):     2.57 cm AV Vmax:           130.00 cm/s AV Vmean:          74.200 cm/s AV VTI:            0.198 m AV Peak Grad:      6.8 mmHg AV Mean Grad:      3.0 mmHg LVOT Vmax:         88.70 cm/s LVOT Vmean:        54.200 cm/s LVOT VTI:          0.147 m LVOT/AV VTI ratio: 0.74  AORTA Ao Root diam: 3.50 cm Ao Arch diam: 2.8 cm  MITRAL VALVE MV Area (PHT): 3.65 cm    SHUNTS MV Decel Time: 208 msec    Systemic VTI:  0.15 m MV E velocity: 64.00 cm/s  Systemic Diam: 2.10 cm MV A velocity: 89.70 cm/s MV E/A ratio:  0.71  Gypsy Balsam MD Electronically signed by Gypsy Balsam MD Signature Date/Time: 06/08/2023/4:54:17 PM    Final   TEE  ECHO INTRAOPERATIVE TEE 07/13/2023  Narrative *INTRAOPERATIVE TRANSESOPHAGEAL REPORT *    Patient Name:   Thomas Harper Date of Exam: 07/13/2023 Medical Rec #:  [id number]       Height:       66.0 in Accession #:    [id number]      Weight:       167.0 lb Date of Birth:  03-Jan-1961       BSA:          1.85 m Patient Age:    62 years        BP:           144/87 mmHg Patient Gender: M               HR:           83 bpm. Exam Location:  Anesthesiology  Transesophogeal exam was perform intraoperatively during surgical procedure. Patient was closely monitored under general anesthesia during the entirety of examination.  Indications:     Left atrial myxoma Performing Phys: [id number] Eliezer Lofts LIGHTFOOT Diagnosing Phys: Val Eagle MD  Complications: No known complications during this procedure. POST-OP IMPRESSIONS _ Left Ventricle: has normal systolic function, with an ejection fraction of 65%. The cavity size was normal. The  wall motion is normal. _ Right Ventricle: normal function. The cavity was normal. The wall motion is normal. _ Aorta: there is no dissection present in the aorta. _ Aortic Valve: The aortic valve appears unchanged from pre-bypass. _ Mitral Valve: The mitral valve appears unchanged from pre-bypass. _ Tricuspid Valve: The tricuspid valve appears unchanged from pre-bypass. _ Comments: previously seen LA myxoma no longer present. No ASD or PFO noted post removal.  PRE-OP FINDINGS Left Ventricle: The left ventricle  cm/m   RA Area:     16.50 cm LA Vol  (A2C):   41.3 ml 22.34 ml/m  RA Volume:   45.40 ml  24.56 ml/m LA Vol (A4C):   48.2 ml 26.08 ml/m LA Biplane Vol: 44.8 ml 24.24 ml/m AORTIC VALVE                    PULMONIC VALVE AV Area (Vmax):    2.36 cm     PV Vmax:       0.80 m/s AV Area (Vmean):   2.53 cm     PV Peak grad:  2.6 mmHg AV Area (VTI):     2.57 cm AV Vmax:           130.00 cm/s AV Vmean:          74.200 cm/s AV VTI:            0.198 m AV Peak Grad:      6.8 mmHg AV Mean Grad:      3.0 mmHg LVOT Vmax:         88.70 cm/s LVOT Vmean:        54.200 cm/s LVOT VTI:          0.147 m LVOT/AV VTI ratio: 0.74  AORTA Ao Root diam: 3.50 cm Ao Arch diam: 2.8 cm  MITRAL VALVE MV Area (PHT): 3.65 cm    SHUNTS MV Decel Time: 208 msec    Systemic VTI:  0.15 m MV E velocity: 64.00 cm/s  Systemic Diam: 2.10 cm MV A velocity: 89.70 cm/s MV E/A ratio:  0.71  Gypsy Balsam MD Electronically signed by Gypsy Balsam MD Signature Date/Time: 06/08/2023/4:54:17 PM    Final   TEE  ECHO INTRAOPERATIVE TEE 07/13/2023  Narrative *INTRAOPERATIVE TRANSESOPHAGEAL REPORT *    Patient Name:   Thomas Harper Date of Exam: 07/13/2023 Medical Rec #:  [id number]       Height:       66.0 in Accession #:    [id number]      Weight:       167.0 lb Date of Birth:  03-Jan-1961       BSA:          1.85 m Patient Age:    62 years        BP:           144/87 mmHg Patient Gender: M               HR:           83 bpm. Exam Location:  Anesthesiology  Transesophogeal exam was perform intraoperatively during surgical procedure. Patient was closely monitored under general anesthesia during the entirety of examination.  Indications:     Left atrial myxoma Performing Phys: [id number] Eliezer Lofts LIGHTFOOT Diagnosing Phys: Val Eagle MD  Complications: No known complications during this procedure. POST-OP IMPRESSIONS _ Left Ventricle: has normal systolic function, with an ejection fraction of 65%. The cavity size was normal. The  wall motion is normal. _ Right Ventricle: normal function. The cavity was normal. The wall motion is normal. _ Aorta: there is no dissection present in the aorta. _ Aortic Valve: The aortic valve appears unchanged from pre-bypass. _ Mitral Valve: The mitral valve appears unchanged from pre-bypass. _ Tricuspid Valve: The tricuspid valve appears unchanged from pre-bypass. _ Comments: previously seen LA myxoma no longer present. No ASD or PFO noted post removal.  PRE-OP FINDINGS Left Ventricle: The left ventricle  50-55% by visual estimate.   Recommend Aspirin 81mg  daily for moderate CAD.  Multivessel CAD with high-grade noncalcified mid 50 to focally 90% LAD stenosis,, diffuse moderate circumflex disease with 75% focal stenosis after marginal vessel, and subtotal mid RCA stenosis with both bridging right to right and left-to-right collaterals supplying a large PDA PLA system.  Low normal LV function with EF estimated at 50 to 55% with subtle focal mid anterolateral wall motion abnormality;  LVEDP 12 mm.  RECOMMENDATION: Plan for outpatient surgical consultation in this patient with left atrial myxoma and multivessel CAD.  He has been on amlodipine.  Will initiate beta-blocker and nitrate therapy.  Will also change lovastatin to high potency atorvastatin 80 mg.  Findings Coronary Findings Diagnostic  Dominance: Right  Left Anterior Descending Mid LAD-1 lesion is 20% stenosed. Mid LAD-2 lesion is 50% stenosed. Mid LAD to Dist LAD lesion is 90% stenosed.  Third Septal Branch Vessel is small in size.  Left Circumflex Prox Cx lesion is 20% stenosed. Prox Cx to Mid Cx lesion is 40% stenosed. Mid Cx lesion is 70% stenosed. Mid Cx to Dist Cx lesion is 75% stenosed.  Fourth Obtuse Marginal Branch Vessel is small in size.  Right Coronary Artery Prox RCA to Mid RCA lesion is 99% stenosed.  Right Ventricular Branch Vessel is small in size.  Right Posterior Atrioventricular Artery Collaterals RPAV filled by collaterals from RV Branch.  Third Right Posterolateral Branch Collaterals 3rd RPL filled by collaterals from 1st Mrg.  Collaterals 3rd RPL filled by collaterals from 2nd Sept.  Intervention  No interventions have been documented.     ECHOCARDIOGRAM  ECHOCARDIOGRAM COMPLETE 06/08/2023  Narrative ECHOCARDIOGRAM REPORT    Patient Name:   Thomas Harper Date of Exam: 06/08/2023 Medical Rec #:  [id number]       Height:       66.0 in Accession #:    [id number]      Weight:       166.2 lb Date of Birth:  06-25-1961       BSA:          1.848 m Patient Age:    62 years        BP:           132/86 mmHg Patient Gender: M               HR:           72 bpm. Exam Location:  [facility]  Procedure: 2D Echo, 3D Echo, Color Doppler, Cardiac Doppler and Strain Analysis  Indications:    R06.9 DOE; R07.2 Precordial pain  History:        Patient has no prior history of Echocardiogram examinations. CAD, Signs/Symptoms:Chest Pain and Dyspnea;  Risk Factors:Hypertension, Dyslipidemia, Former Smoker and Family History of Coronary Artery Disease. Patient complains of chest discomfort with DOE. He denies leg edema.  Sonographer:    Carlos American RVT, RDCS (AE), RDMS Referring Phys: [phone] CHRISTOPHER L SCHUMANN  IMPRESSIONS   1. Left ventricular ejection fraction, by estimation, is 60 to 65%. The left ventricle has normal function. The left ventricle has no regional wall motion abnormalities. Left ventricular diastolic parameters are consistent with Grade I diastolic dysfunction (impaired relaxation). 2. Right ventricular systolic function is normal. The right ventricular size is normal. 3. Mass measuring 22x16 mm attached to intraatrial sepum on the left atrial side noted. Differencial should include myxoma - most likely v/s thrombus. 4. The mitral valve is normal in structure.  cm/m   RA Area:     16.50 cm LA Vol  (A2C):   41.3 ml 22.34 ml/m  RA Volume:   45.40 ml  24.56 ml/m LA Vol (A4C):   48.2 ml 26.08 ml/m LA Biplane Vol: 44.8 ml 24.24 ml/m AORTIC VALVE                    PULMONIC VALVE AV Area (Vmax):    2.36 cm     PV Vmax:       0.80 m/s AV Area (Vmean):   2.53 cm     PV Peak grad:  2.6 mmHg AV Area (VTI):     2.57 cm AV Vmax:           130.00 cm/s AV Vmean:          74.200 cm/s AV VTI:            0.198 m AV Peak Grad:      6.8 mmHg AV Mean Grad:      3.0 mmHg LVOT Vmax:         88.70 cm/s LVOT Vmean:        54.200 cm/s LVOT VTI:          0.147 m LVOT/AV VTI ratio: 0.74  AORTA Ao Root diam: 3.50 cm Ao Arch diam: 2.8 cm  MITRAL VALVE MV Area (PHT): 3.65 cm    SHUNTS MV Decel Time: 208 msec    Systemic VTI:  0.15 m MV E velocity: 64.00 cm/s  Systemic Diam: 2.10 cm MV A velocity: 89.70 cm/s MV E/A ratio:  0.71  Gypsy Balsam MD Electronically signed by Gypsy Balsam MD Signature Date/Time: 06/08/2023/4:54:17 PM    Final   TEE  ECHO INTRAOPERATIVE TEE 07/13/2023  Narrative *INTRAOPERATIVE TRANSESOPHAGEAL REPORT *    Patient Name:   Thomas Harper Date of Exam: 07/13/2023 Medical Rec #:  [id number]       Height:       66.0 in Accession #:    [id number]      Weight:       167.0 lb Date of Birth:  03-Jan-1961       BSA:          1.85 m Patient Age:    62 years        BP:           144/87 mmHg Patient Gender: M               HR:           83 bpm. Exam Location:  Anesthesiology  Transesophogeal exam was perform intraoperatively during surgical procedure. Patient was closely monitored under general anesthesia during the entirety of examination.  Indications:     Left atrial myxoma Performing Phys: [id number] Eliezer Lofts LIGHTFOOT Diagnosing Phys: Val Eagle MD  Complications: No known complications during this procedure. POST-OP IMPRESSIONS _ Left Ventricle: has normal systolic function, with an ejection fraction of 65%. The cavity size was normal. The  wall motion is normal. _ Right Ventricle: normal function. The cavity was normal. The wall motion is normal. _ Aorta: there is no dissection present in the aorta. _ Aortic Valve: The aortic valve appears unchanged from pre-bypass. _ Mitral Valve: The mitral valve appears unchanged from pre-bypass. _ Tricuspid Valve: The tricuspid valve appears unchanged from pre-bypass. _ Comments: previously seen LA myxoma no longer present. No ASD or PFO noted post removal.  PRE-OP FINDINGS Left Ventricle: The left ventricle  50-55% by visual estimate.   Recommend Aspirin 81mg  daily for moderate CAD.  Multivessel CAD with high-grade noncalcified mid 50 to focally 90% LAD stenosis,, diffuse moderate circumflex disease with 75% focal stenosis after marginal vessel, and subtotal mid RCA stenosis with both bridging right to right and left-to-right collaterals supplying a large PDA PLA system.  Low normal LV function with EF estimated at 50 to 55% with subtle focal mid anterolateral wall motion abnormality;  LVEDP 12 mm.  RECOMMENDATION: Plan for outpatient surgical consultation in this patient with left atrial myxoma and multivessel CAD.  He has been on amlodipine.  Will initiate beta-blocker and nitrate therapy.  Will also change lovastatin to high potency atorvastatin 80 mg.  Findings Coronary Findings Diagnostic  Dominance: Right  Left Anterior Descending Mid LAD-1 lesion is 20% stenosed. Mid LAD-2 lesion is 50% stenosed. Mid LAD to Dist LAD lesion is 90% stenosed.  Third Septal Branch Vessel is small in size.  Left Circumflex Prox Cx lesion is 20% stenosed. Prox Cx to Mid Cx lesion is 40% stenosed. Mid Cx lesion is 70% stenosed. Mid Cx to Dist Cx lesion is 75% stenosed.  Fourth Obtuse Marginal Branch Vessel is small in size.  Right Coronary Artery Prox RCA to Mid RCA lesion is 99% stenosed.  Right Ventricular Branch Vessel is small in size.  Right Posterior Atrioventricular Artery Collaterals RPAV filled by collaterals from RV Branch.  Third Right Posterolateral Branch Collaterals 3rd RPL filled by collaterals from 1st Mrg.  Collaterals 3rd RPL filled by collaterals from 2nd Sept.  Intervention  No interventions have been documented.     ECHOCARDIOGRAM  ECHOCARDIOGRAM COMPLETE 06/08/2023  Narrative ECHOCARDIOGRAM REPORT    Patient Name:   Thomas Harper Date of Exam: 06/08/2023 Medical Rec #:  [id number]       Height:       66.0 in Accession #:    [id number]      Weight:       166.2 lb Date of Birth:  06-25-1961       BSA:          1.848 m Patient Age:    62 years        BP:           132/86 mmHg Patient Gender: M               HR:           72 bpm. Exam Location:  [facility]  Procedure: 2D Echo, 3D Echo, Color Doppler, Cardiac Doppler and Strain Analysis  Indications:    R06.9 DOE; R07.2 Precordial pain  History:        Patient has no prior history of Echocardiogram examinations. CAD, Signs/Symptoms:Chest Pain and Dyspnea;  Risk Factors:Hypertension, Dyslipidemia, Former Smoker and Family History of Coronary Artery Disease. Patient complains of chest discomfort with DOE. He denies leg edema.  Sonographer:    Carlos American RVT, RDCS (AE), RDMS Referring Phys: [phone] CHRISTOPHER L SCHUMANN  IMPRESSIONS   1. Left ventricular ejection fraction, by estimation, is 60 to 65%. The left ventricle has normal function. The left ventricle has no regional wall motion abnormalities. Left ventricular diastolic parameters are consistent with Grade I diastolic dysfunction (impaired relaxation). 2. Right ventricular systolic function is normal. The right ventricular size is normal. 3. Mass measuring 22x16 mm attached to intraatrial sepum on the left atrial side noted. Differencial should include myxoma - most likely v/s thrombus. 4. The mitral valve is normal in structure.  cm/m   RA Area:     16.50 cm LA Vol  (A2C):   41.3 ml 22.34 ml/m  RA Volume:   45.40 ml  24.56 ml/m LA Vol (A4C):   48.2 ml 26.08 ml/m LA Biplane Vol: 44.8 ml 24.24 ml/m AORTIC VALVE                    PULMONIC VALVE AV Area (Vmax):    2.36 cm     PV Vmax:       0.80 m/s AV Area (Vmean):   2.53 cm     PV Peak grad:  2.6 mmHg AV Area (VTI):     2.57 cm AV Vmax:           130.00 cm/s AV Vmean:          74.200 cm/s AV VTI:            0.198 m AV Peak Grad:      6.8 mmHg AV Mean Grad:      3.0 mmHg LVOT Vmax:         88.70 cm/s LVOT Vmean:        54.200 cm/s LVOT VTI:          0.147 m LVOT/AV VTI ratio: 0.74  AORTA Ao Root diam: 3.50 cm Ao Arch diam: 2.8 cm  MITRAL VALVE MV Area (PHT): 3.65 cm    SHUNTS MV Decel Time: 208 msec    Systemic VTI:  0.15 m MV E velocity: 64.00 cm/s  Systemic Diam: 2.10 cm MV A velocity: 89.70 cm/s MV E/A ratio:  0.71  Gypsy Balsam MD Electronically signed by Gypsy Balsam MD Signature Date/Time: 06/08/2023/4:54:17 PM    Final   TEE  ECHO INTRAOPERATIVE TEE 07/13/2023  Narrative *INTRAOPERATIVE TRANSESOPHAGEAL REPORT *    Patient Name:   Thomas Harper Date of Exam: 07/13/2023 Medical Rec #:  [id number]       Height:       66.0 in Accession #:    [id number]      Weight:       167.0 lb Date of Birth:  03-Jan-1961       BSA:          1.85 m Patient Age:    62 years        BP:           144/87 mmHg Patient Gender: M               HR:           83 bpm. Exam Location:  Anesthesiology  Transesophogeal exam was perform intraoperatively during surgical procedure. Patient was closely monitored under general anesthesia during the entirety of examination.  Indications:     Left atrial myxoma Performing Phys: [id number] Eliezer Lofts LIGHTFOOT Diagnosing Phys: Val Eagle MD  Complications: No known complications during this procedure. POST-OP IMPRESSIONS _ Left Ventricle: has normal systolic function, with an ejection fraction of 65%. The cavity size was normal. The  wall motion is normal. _ Right Ventricle: normal function. The cavity was normal. The wall motion is normal. _ Aorta: there is no dissection present in the aorta. _ Aortic Valve: The aortic valve appears unchanged from pre-bypass. _ Mitral Valve: The mitral valve appears unchanged from pre-bypass. _ Tricuspid Valve: The tricuspid valve appears unchanged from pre-bypass. _ Comments: previously seen LA myxoma no longer present. No ASD or PFO noted post removal.  PRE-OP FINDINGS Left Ventricle: The left ventricle  cm/m   RA Area:     16.50 cm LA Vol  (A2C):   41.3 ml 22.34 ml/m  RA Volume:   45.40 ml  24.56 ml/m LA Vol (A4C):   48.2 ml 26.08 ml/m LA Biplane Vol: 44.8 ml 24.24 ml/m AORTIC VALVE                    PULMONIC VALVE AV Area (Vmax):    2.36 cm     PV Vmax:       0.80 m/s AV Area (Vmean):   2.53 cm     PV Peak grad:  2.6 mmHg AV Area (VTI):     2.57 cm AV Vmax:           130.00 cm/s AV Vmean:          74.200 cm/s AV VTI:            0.198 m AV Peak Grad:      6.8 mmHg AV Mean Grad:      3.0 mmHg LVOT Vmax:         88.70 cm/s LVOT Vmean:        54.200 cm/s LVOT VTI:          0.147 m LVOT/AV VTI ratio: 0.74  AORTA Ao Root diam: 3.50 cm Ao Arch diam: 2.8 cm  MITRAL VALVE MV Area (PHT): 3.65 cm    SHUNTS MV Decel Time: 208 msec    Systemic VTI:  0.15 m MV E velocity: 64.00 cm/s  Systemic Diam: 2.10 cm MV A velocity: 89.70 cm/s MV E/A ratio:  0.71  Gypsy Balsam MD Electronically signed by Gypsy Balsam MD Signature Date/Time: 06/08/2023/4:54:17 PM    Final   TEE  ECHO INTRAOPERATIVE TEE 07/13/2023  Narrative *INTRAOPERATIVE TRANSESOPHAGEAL REPORT *    Patient Name:   Thomas Harper Date of Exam: 07/13/2023 Medical Rec #:  [id number]       Height:       66.0 in Accession #:    [id number]      Weight:       167.0 lb Date of Birth:  03-Jan-1961       BSA:          1.85 m Patient Age:    62 years        BP:           144/87 mmHg Patient Gender: M               HR:           83 bpm. Exam Location:  Anesthesiology  Transesophogeal exam was perform intraoperatively during surgical procedure. Patient was closely monitored under general anesthesia during the entirety of examination.  Indications:     Left atrial myxoma Performing Phys: [id number] Eliezer Lofts LIGHTFOOT Diagnosing Phys: Val Eagle MD  Complications: No known complications during this procedure. POST-OP IMPRESSIONS _ Left Ventricle: has normal systolic function, with an ejection fraction of 65%. The cavity size was normal. The  wall motion is normal. _ Right Ventricle: normal function. The cavity was normal. The wall motion is normal. _ Aorta: there is no dissection present in the aorta. _ Aortic Valve: The aortic valve appears unchanged from pre-bypass. _ Mitral Valve: The mitral valve appears unchanged from pre-bypass. _ Tricuspid Valve: The tricuspid valve appears unchanged from pre-bypass. _ Comments: previously seen LA myxoma no longer present. No ASD or PFO noted post removal.  PRE-OP FINDINGS Left Ventricle: The left ventricle

## 2023-09-12 ENCOUNTER — Encounter: Payer: 59 | Admitting: *Deleted

## 2023-09-12 ENCOUNTER — Other Ambulatory Visit: Payer: Self-pay | Admitting: Physician Assistant

## 2023-09-12 ENCOUNTER — Ambulatory Visit: Payer: 59 | Attending: Cardiology | Admitting: Cardiology

## 2023-09-12 VITALS — BP 102/64 | HR 76 | Ht 65.0 in | Wt 166.4 lb

## 2023-09-12 DIAGNOSIS — D151 Benign neoplasm of heart: Secondary | ICD-10-CM | POA: Diagnosis not present

## 2023-09-12 DIAGNOSIS — Z951 Presence of aortocoronary bypass graft: Secondary | ICD-10-CM

## 2023-09-12 DIAGNOSIS — I2510 Atherosclerotic heart disease of native coronary artery without angina pectoris: Secondary | ICD-10-CM | POA: Diagnosis not present

## 2023-09-12 DIAGNOSIS — E785 Hyperlipidemia, unspecified: Secondary | ICD-10-CM

## 2023-09-12 DIAGNOSIS — I483 Typical atrial flutter: Secondary | ICD-10-CM

## 2023-09-12 DIAGNOSIS — I10 Essential (primary) hypertension: Secondary | ICD-10-CM

## 2023-09-12 MED ORDER — TRAMADOL HCL 50 MG PO TABS: 50.0000 mg | ORAL_TABLET | Freq: Four times a day (QID) | ORAL | 0 refills | Status: DC | PRN

## 2023-09-12 NOTE — Patient Instructions (Signed)
Medication Instructions:  STOP ELIQUIS *If you need a refill on your cardiac medications before your next appointment, please call your pharmacy*  Lab Work: NONE If you have labs (blood work) drawn today and your tests are completely normal, you will receive your results only by: MyChart Message (if you have MyChart) OR A paper copy in the mail If you have any lab test that is abnormal or we need to change your treatment, we will call you to review the results.  Follow-Up: At [facility], you and your health needs are our priority.  As part of our continuing mission to provide you with exceptional heart care, we have created designated Provider Care Teams.  These Care Teams include your primary Cardiologist (physician) and Advanced Practice Providers (APPs -  Physician Assistants and Nurse Practitioners) who all work together to provide you with the care you need, when you need it.  Your next appointment:   4-6 week(s)  Provider:   Little Ishikawa, MD  or Robet Leu, PA-C

## 2023-09-12 NOTE — Progress Notes (Signed)
Daily Session Note  Patient Details  Name: Thomas Harper MRN: [id number] Date of Birth: 1961-01-06 Referring Provider:   Flowsheet Row Cardiac Rehab from 09/06/2023 in [facility] Cardiac and Pulmonary Rehab  Referring Provider Dr. Marikay Alar       Encounter Date: 09/12/2023  Check In:  Session Check In - 09/12/23 1118       Check-In   Supervising physician immediately available to respond to emergencies See telemetry face sheet for immediately available ER MD    Location [facility]-Cardiac & Pulmonary Rehab    Staff Present Thomas Harper, BS, ACSM CEP, Exercise Physiologist;Thomas Codd Katrinka Blazing, RN, ADN;Thomas Best, MS, Exercise Physiologist    Virtual Visit No    Medication changes reported     No    Fall or balance concerns reported    No    Warm-up and Cool-down Performed on first and last piece of equipment    Resistance Training Performed Yes    VAD Patient? No    PAD/SET Patient? No      Pain Assessment   Currently in Pain? No/denies                Social History   Tobacco Use  Smoking Status Former   Current packs/day: 0.00   Types: Cigarettes   Quit date: 05/21/2021   Years since quitting: 2.3  Smokeless Tobacco Former    Goals Met:  Independence with exercise equipment Exercise tolerated well No report of concerns or symptoms today Strength training completed today  Goals Unmet:  Not Applicable  Comments: Pt able to follow exercise prescription today without complaint.  Will continue to monitor for progression.    Dr. Bethann Punches is Medical Director for [facility] Cardiac Rehabilitation.  Dr. Vida Rigger is Medical Director for [facility] Pulmonary Rehabilitation.

## 2023-09-13 ENCOUNTER — Encounter: Payer: Self-pay | Admitting: Internal Medicine

## 2023-09-13 ENCOUNTER — Ambulatory Visit (INDEPENDENT_AMBULATORY_CARE_PROVIDER_SITE_OTHER): Payer: 59 | Admitting: Internal Medicine

## 2023-09-13 ENCOUNTER — Encounter: Payer: Self-pay | Admitting: Cardiology

## 2023-09-13 VITALS — BP 128/74 | HR 88 | Temp 98.2°F | Resp 16 | Ht 66.0 in | Wt 164.4 lb

## 2023-09-13 DIAGNOSIS — D151 Benign neoplasm of heart: Secondary | ICD-10-CM

## 2023-09-13 DIAGNOSIS — I10 Essential (primary) hypertension: Secondary | ICD-10-CM | POA: Diagnosis not present

## 2023-09-13 DIAGNOSIS — Z23 Encounter for immunization: Secondary | ICD-10-CM | POA: Diagnosis not present

## 2023-09-13 DIAGNOSIS — E7800 Pure hypercholesterolemia, unspecified: Secondary | ICD-10-CM

## 2023-09-13 DIAGNOSIS — D75839 Thrombocytosis, unspecified: Secondary | ICD-10-CM | POA: Insufficient documentation

## 2023-09-13 DIAGNOSIS — G2581 Restless legs syndrome: Secondary | ICD-10-CM | POA: Diagnosis not present

## 2023-09-13 DIAGNOSIS — K219 Gastro-esophageal reflux disease without esophagitis: Secondary | ICD-10-CM

## 2023-09-13 DIAGNOSIS — R739 Hyperglycemia, unspecified: Secondary | ICD-10-CM

## 2023-09-13 DIAGNOSIS — I25119 Atherosclerotic heart disease of native coronary artery with unspecified angina pectoris: Secondary | ICD-10-CM | POA: Diagnosis not present

## 2023-09-13 DIAGNOSIS — D649 Anemia, unspecified: Secondary | ICD-10-CM

## 2023-09-13 LAB — CBC WITH DIFFERENTIAL/PLATELET
Basophils Absolute: 0 10*3/uL (ref 0.0–0.1)
Basophils Relative: 0.8 % (ref 0.0–3.0)
Eosinophils Absolute: 0.1 10*3/uL (ref 0.0–0.7)
Eosinophils Relative: 1.6 % (ref 0.0–5.0)
HCT: 44.3 % (ref 39.0–52.0)
Hemoglobin: 14.2 g/dL (ref 13.0–17.0)
Lymphocytes Relative: 24.7 % (ref 12.0–46.0)
Lymphs Abs: 1.4 10*3/uL (ref 0.7–4.0)
MCHC: 32 g/dL (ref 30.0–36.0)
MCV: 88.4 fl (ref 78.0–100.0)
Monocytes Absolute: 0.6 10*3/uL (ref 0.1–1.0)
Monocytes Relative: 10.6 % (ref 3.0–12.0)
Neutro Abs: 3.7 10*3/uL (ref 1.4–7.7)
Neutrophils Relative %: 62.3 % (ref 43.0–77.0)
Platelets: 297 10*3/uL (ref 150.0–400.0)
RBC: 5.01 Mil/uL (ref 4.22–5.81)
RDW: 14.8 % (ref 11.5–15.5)
WBC: 5.9 10*3/uL (ref 4.0–10.5)

## 2023-09-13 LAB — HEPATIC FUNCTION PANEL
ALT: 42 U/L (ref 0–53)
AST: 25 U/L (ref 0–37)
Albumin: 4.3 g/dL (ref 3.5–5.2)
Alkaline Phosphatase: 96 U/L (ref 39–117)
Bilirubin, Direct: 0.1 mg/dL (ref 0.0–0.3)
Total Bilirubin: 0.3 mg/dL (ref 0.2–1.2)
Total Protein: 7 g/dL (ref 6.0–8.3)

## 2023-09-13 LAB — IBC + FERRITIN
Ferritin: 50.7 ng/mL (ref 22.0–322.0)
Iron: 68 ug/dL (ref 42–165)
Saturation Ratios: 19.7 % — ABNORMAL LOW (ref 20.0–50.0)
TIBC: 345.8 ug/dL (ref 250.0–450.0)
Transferrin: 247 mg/dL (ref 212.0–360.0)

## 2023-09-13 LAB — BASIC METABOLIC PANEL
BUN: 18 mg/dL (ref 6–23)
CO2: 26 meq/L (ref 19–32)
Calcium: 9.6 mg/dL (ref 8.4–10.5)
Chloride: 102 meq/L (ref 96–112)
Creatinine, Ser: 0.98 mg/dL (ref 0.40–1.50)
GFR: 82.76 mL/min (ref >60.00)
Glucose, Bld: 83 mg/dL (ref 70–99)
Potassium: 4.9 meq/L (ref 3.5–5.1)
Sodium: 138 meq/L (ref 135–145)
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
## 2023-09-14 ENCOUNTER — Encounter: Payer: 59 | Admitting: *Deleted

## 2023-09-14 DIAGNOSIS — Z48812 Encounter for surgical aftercare following surgery on the circulatory system: Secondary | ICD-10-CM | POA: Diagnosis not present

## 2023-09-14 DIAGNOSIS — Z951 Presence of aortocoronary bypass graft: Secondary | ICD-10-CM

## 2023-09-14 NOTE — Progress Notes (Signed)
Daily Session Note  Patient Details  Name: Thomas Harper MRN: [id number] Date of Birth: [birth year] Referring Provider:   Flowsheet Row Cardiac Rehab from 09/06/2023 in [facility] Cardiac and Pulmonary Rehab  Referring Provider Dr. Marikay Alar       Encounter Date: 09/14/2023  Check In:  Session Check In - 09/14/23 1135       Check-In   Supervising physician immediately available to respond to emergencies See telemetry face sheet for immediately available ER MD    Location [facility]-Cardiac & Pulmonary Rehab    Staff Present Rory Percy, MS, Exercise Physiologist;Joseph Reino Kent, RCP,RRT,BSRT;Maxon Berwyn BS, , Exercise Physiologist;Kathrina Crosley Katrinka Blazing, RN, ADN    Virtual Visit No    Medication changes reported     No    Fall or balance concerns reported    No    Warm-up and Cool-down Performed on first and last piece of equipment    Resistance Training Performed Yes    VAD Patient? No    PAD/SET Patient? No      Pain Assessment   Currently in Pain? No/denies                Social History   Tobacco Use  Smoking Status Former   Current packs/day: 0.00   Types: Cigarettes   Quit date: 05/21/2021   Years since quitting: 2.3  Smokeless Tobacco Former    Goals Met:  Independence with exercise equipment Exercise tolerated well No report of concerns or symptoms today Strength training completed today  Goals Unmet:  Not Applicable  Comments: Pt able to follow exercise prescription today without complaint.  Will continue to monitor for progression.    Dr. Bethann Punches is Medical Director for [facility] Cardiac Rehabilitation.  Dr. Vida Rigger is Medical Director for [facility] Pulmonary Rehabilitation.

## 2023-09-14 NOTE — Progress Notes (Signed)
Cardiac Individual Treatment Plan  Patient Details  Name: Thomas Harper MRN: [id number] Date of Birth: 1961-06-15 Referring Provider:   Flowsheet Row Cardiac Rehab from 09/06/2023 in [facility] Cardiac and Pulmonary Rehab  Referring Provider Dr. Marikay Alar       Initial Encounter Date:  Flowsheet Row Cardiac Rehab from 09/06/2023 in [facility] Cardiac and Pulmonary Rehab  Date 09/06/23       Visit Diagnosis: S/P CABG x 3  Patient's Home Medications on Admission:  Current Outpatient Medications:    acetaminophen (TYLENOL) 500 MG tablet, Take 1,000 mg by mouth every 8 (eight) hours as needed for headache., Disp: , Rfl:    aspirin EC 81 MG tablet, Take 1 tablet (81 mg total) by mouth daily. Swallow whole., Disp: 90 tablet, Rfl: 3   atorvastatin (LIPITOR) 80 MG tablet, Take 1 tablet (80 mg total) by mouth daily., Disp: 30 tablet, Rfl: 11   calcium carbonate (TUMS - DOSED IN MG ELEMENTAL CALCIUM) 500 MG chewable tablet, Chew 1 tablet by mouth continuous as needed for indigestion or heartburn., Disp: , Rfl:    clopidogrel (PLAVIX) 75 MG tablet, Take 1 tablet (75 mg total) by mouth daily., Disp: 30 tablet, Rfl: 1   metoprolol tartrate (LOPRESSOR) 50 MG tablet, Take 1 tablet (50 mg total) by mouth 2 (two) times daily., Disp: 180 tablet, Rfl: 3   pantoprazole (PROTONIX) 40 MG tablet, Take 1 tablet (40 mg total) by mouth daily. Take 30 minutes before your evening meal, Disp: 30 tablet, Rfl: 1   traMADol (ULTRAM) 50 MG tablet, Take 1 tablet (50 mg total) by mouth every 6 (six) hours as needed., Disp: 15 tablet, Rfl: 0  Past Medical History: Past Medical History:  Diagnosis Date   Anginal pain (HCC)    Chronic leg pain    s/p injury right leg (age 20)   Coronary artery disease    GERD (gastroesophageal reflux disease)    History of ankle fracture    persistent pain   History of kidney stones    Hypercholesterolemia    Hypertension    Nephrolithiasis    followed by Assunta Gambles   Vitamin D  deficiency     Tobacco Use: Social History   Tobacco Use  Smoking Status Former   Current packs/day: 0.00   Types: Cigarettes   Quit date: 05/21/2021   Years since quitting: 2.3  Smokeless Tobacco Former    Labs: Review Flowsheet  More data exists      Latest Ref Rng & Units 11/12/2022 03/18/2023 07/11/2023 07/13/2023 07/29/2023  Labs for ITP Cardiac and Pulmonary Rehab  Cholestrol 0 - 200 mg/dL 409  811  - - 914   LDL (calc) 0 - 99 mg/dL 782  956  - - 72   HDL-C >39.00 mg/dL 21.30  86.57  - - 84.69   Trlycerides 0.0 - 149.0 mg/dL 62.9  528.4  - - 132.4   Hemoglobin A1c 4.8 - 5.6 % - 6.2  5.9  - -  PH, Arterial 7.35 - 7.45 - - 7.42  7.355  7.358  7.338  7.444  7.362  -  PCO2 arterial 32 - 48 mmHg - - 38  37.6  40.9  45.1  32.1  44.0  -  Bicarbonate 20.0 - 28.0 mmol/L - - 24.6  21.1  23.2  24.2  22.0  25.0  -  TCO2 22 - 32 mmol/L - - - 22  25  24  26  26  27   24  23  24  27  26   -  Acid-base deficit 0.0 - 2.0 mmol/L - - - 4.0  2.0  2.0  2.0  1.0  -  O2 Saturation % - - 99  99  99  99  100  100  -    Details       Multiple values from one day are sorted in reverse-chronological order          Exercise Target Goals: Exercise Program Goal: Individual exercise prescription set using results from initial 6 min walk test and THRR while considering  patient's activity barriers and safety.   Exercise Prescription Goal: Initial exercise prescription builds to 30-45 minutes a day of aerobic activity, 2-3 days per week.  Home exercise guidelines will be given to patient during program as part of exercise prescription that the participant will acknowledge.   Education: Aerobic Exercise: - Group verbal and visual presentation on the components of exercise prescription. Introduces F.I.T.T principle from ACSM for exercise prescriptions.  Reviews F.I.T.T. principles of aerobic exercise including progression. Written material given at graduation.   Education: Resistance Exercise: -  Group verbal and visual presentation on the components of exercise prescription. Introduces F.I.T.T principle from ACSM for exercise prescriptions  Reviews F.I.T.T. principles of resistance exercise including progression. Written material given at graduation.    Education: Exercise & Equipment Safety: - Individual verbal instruction and demonstration of equipment use and safety with use of the equipment. Flowsheet Row Cardiac Rehab from 09/06/2023 in [facility] Cardiac and Pulmonary Rehab  Date 09/06/23  Educator mc  Instruction Review Code 1- Verbalizes Understanding       Education: Exercise Physiology & General Exercise Guidelines: - Group verbal and written instruction with models to review the exercise physiology of the cardiovascular system and associated critical values. Provides general exercise guidelines with specific guidelines to those with heart or lung disease.    Education: Flexibility, Balance, Mind/Body Relaxation: - Group verbal and visual presentation with interactive activity on the components of exercise prescription. Introduces F.I.T.T principle from ACSM for exercise prescriptions. Reviews F.I.T.T. principles of flexibility and balance exercise training including progression. Also discusses the mind body connection.  Reviews various relaxation techniques to help reduce and manage stress (i.e. Deep breathing, progressive muscle relaxation, and visualization). Balance handout provided to take home. Written material given at graduation.   Activity Barriers & Risk Stratification:  Activity Barriers & Cardiac Risk Stratification - 09/06/23 1547       Activity Barriers & Cardiac Risk Stratification   Activity Barriers None    Cardiac Risk Stratification High             6 Minute Walk:  6 Minute Walk     Row Name 09/06/23 1545         6 Minute Walk   Phase Initial     Distance 1200 feet     Walk Time 6 minutes     # of Rest Breaks 0     MPH 2.3     METS 3.1      RPE 11     Perceived Dyspnea  0     VO2 Peak 10.7     Symptoms No     Resting HR 72 bpm     Resting BP 106/64     Resting Oxygen Saturation  96 %     Exercise Oxygen Saturation  during 6 min walk 98 %     Max Ex. HR 107 bpm  Max Ex. BP 114/70     2 Minute Post BP 110/68              Oxygen Initial Assessment:   Oxygen Re-Evaluation:   Oxygen Discharge (Final Oxygen Re-Evaluation):   Initial Exercise Prescription:  Initial Exercise Prescription - 09/06/23 1500       Date of Initial Exercise RX and Referring Provider   Date 09/06/23    Referring Provider Dr. Marikay Alar      Oxygen   Maintain Oxygen Saturation 88% or higher      Treadmill   MPH 2.3    Grade 0    Minutes 15    METs 2.76      NuStep   Level 2    Minutes 15    METs 3.1      REL-XR   Level 2    Minutes 15    METs 3.1      T5 Nustep   Level 2    Minutes 15    METs 3.1      Biostep-RELP   Level 2    Minutes 15    METs 3.1      Prescription Details   Duration Progress to 30 minutes of continuous aerobic without signs/symptoms of physical distress      Intensity   THRR 40-80% of Max Heartrate 106-140    Ratings of Perceived Exertion 11-13    Perceived Dyspnea 0-4      Progression   Progression Continue to progress workloads to maintain intensity without signs/symptoms of physical distress.      Resistance Training   Training Prescription Yes    Weight 5lb    Reps 10-15             Perform Capillary Blood Glucose checks as needed.  Exercise Prescription Changes:   Exercise Prescription Changes     Row Name 09/06/23 1500             Response to Exercise   Blood Pressure (Admit) 106/64       Blood Pressure (Exercise) 114/70       Blood Pressure (Exit) 110/68       Heart Rate (Admit) 72 bpm       Heart Rate (Exercise) 107 bpm       Heart Rate (Exit) 73 bpm       Oxygen Saturation (Admit) 96 %       Oxygen Saturation (Exercise) 98 %       Oxygen  Saturation (Exit) 98 %       Rating of Perceived Exertion (Exercise) 11       Perceived Dyspnea (Exercise) 0       Symptoms none       Comments Orientation       Duration Progress to 30 minutes of  aerobic without signs/symptoms of physical distress       Intensity THRR unchanged         Progression   Progression Continue to progress workloads to maintain intensity without signs/symptoms of physical distress.       Average METs 3.1                Exercise Comments:   Exercise Comments     Row Name 09/09/23 1121           Exercise Comments First full day of exercise!  Patient was oriented to gym and equipment including functions, settings, policies, and procedures.  Patient's individual exercise prescription  and treatment plan were reviewed.  All starting workloads were established based on the results of the 6 minute walk test done at initial orientation visit.  The plan for exercise progression was also introduced and progression will be customized based on patient's performance and goals.                Exercise Goals and Review:   Exercise Goals     Row Name 09/06/23 1552             Exercise Goals   Increase Physical Activity Yes       Intervention Provide advice, education, support and counseling about physical activity/exercise needs.;Develop an individualized exercise prescription for aerobic and resistive training based on initial evaluation findings, risk stratification, comorbidities and participant's personal goals.       Expected Outcomes Long Term: Exercising regularly at least 3-5 days a week.;Long Term: Add in home exercise to make exercise part of routine and to increase amount of physical activity.;Short Term: Attend rehab on a regular basis to increase amount of physical activity.       Increase Strength and Stamina Yes       Intervention Provide advice, education, support and counseling about physical activity/exercise needs.;Develop an  individualized exercise prescription for aerobic and resistive training based on initial evaluation findings, risk stratification, comorbidities and participant's personal goals.       Expected Outcomes Long Term: Improve cardiorespiratory fitness, muscular endurance and strength as measured by increased METs and functional capacity ( );Short Term: Perform resistance training exercises routinely during rehab and add in resistance training at home;Short Term: Increase workloads from initial exercise prescription for resistance, speed, and METs.       Able to understand and use rate of perceived exertion (RPE) scale Yes       Intervention Provide education and explanation on how to use RPE scale       Expected Outcomes Long Term:  Able to use RPE to guide intensity level when exercising independently;Short Term: Able to use RPE daily in rehab to express subjective intensity level       Able to understand and use Dyspnea scale Yes       Intervention Provide education and explanation on how to use Dyspnea scale       Expected Outcomes Long Term: Able to use Dyspnea scale to guide intensity level when exercising independently;Short Term: Able to use Dyspnea scale daily in rehab to express subjective sense of shortness of breath during exertion       Knowledge and understanding of Target Heart Rate Range (THRR) Yes       Intervention Provide education and explanation of THRR including how the numbers were predicted and where they are located for reference       Expected Outcomes Long Term: Able to use THRR to govern intensity when exercising independently;Short Term: Able to use daily as guideline for intensity in rehab;Short Term: Able to state/look up THRR       Able to check pulse independently Yes       Intervention Review the importance of being able to check your own pulse for safety during independent exercise;Provide education and demonstration on how to check pulse in carotid and radial arteries.        Expected Outcomes Long Term: Able to check pulse independently and accurately;Short Term: Able to explain why pulse checking is important during independent exercise       Understanding of Exercise Prescription Yes  Intervention Provide education, explanation, and written materials on patient's individual exercise prescription       Expected Outcomes Short Term: Able to explain program exercise prescription;Long Term: Able to explain home exercise prescription to exercise independently                Exercise Goals Re-Evaluation :  Exercise Goals Re-Evaluation     Row Name 09/09/23 1121             Exercise Goal Re-Evaluation   Exercise Goals Review Able to understand and use rate of perceived exertion (RPE) scale;Knowledge and understanding of Target Heart Rate Range (THRR);Able to understand and use Dyspnea scale;Understanding of Exercise Prescription       Comments Reviewed RPE  and dyspnea scale, THR and program prescription with pt today.  Pt voiced understanding and was given a copy of goals to take home.       Expected Outcomes Short: Use RPE daily to regulate intensity. Long: Follow program prescription in THR.                Discharge Exercise Prescription (Final Exercise Prescription Changes):  Exercise Prescription Changes - 09/06/23 1500       Response to Exercise   Blood Pressure (Admit) 106/64    Blood Pressure (Exercise) 114/70    Blood Pressure (Exit) 110/68    Heart Rate (Admit) 72 bpm    Heart Rate (Exercise) 107 bpm    Heart Rate (Exit) 73 bpm    Oxygen Saturation (Admit) 96 %    Oxygen Saturation (Exercise) 98 %    Oxygen Saturation (Exit) 98 %    Rating of Perceived Exertion (Exercise) 11    Perceived Dyspnea (Exercise) 0    Symptoms none    Comments Orientation    Duration Progress to 30 minutes of  aerobic without signs/symptoms of physical distress    Intensity THRR unchanged      Progression   Progression Continue to  progress workloads to maintain intensity without signs/symptoms of physical distress.    Average METs 3.1             Nutrition:  Target Goals: Understanding of nutrition guidelines, daily intake of sodium 1500mg , cholesterol 200mg , calories 30% from fat and 7% or less from saturated fats, daily to have 5 or more servings of fruits and vegetables.  Education: All About Nutrition: -Group instruction provided by verbal, written material, interactive activities, discussions, models, and posters to present general guidelines for heart healthy nutrition including fat, fiber, MyPlate, the role of sodium in heart healthy nutrition, utilization of the nutrition label, and utilization of this knowledge for meal planning. Follow up email sent as well. Written material given at graduation.   Biometrics:  Pre Biometrics - 09/06/23 1553       Pre Biometrics   Height 5\' 5"  (1.651 m)    Weight 163 lb 8 oz (74.2 kg)    Waist Circumference 37 inches    Hip Circumference 37.5 inches    Waist to Hip Ratio 0.99 %    BMI (Calculated) 27.21    Single Leg Stand 30 seconds              Nutrition Therapy Plan and Nutrition Goals:   Nutrition Assessments:  MEDIFICTS Score Key: >=70 Need to make dietary changes  40-70 Heart Healthy Diet <= 40 Therapeutic Level Cholesterol Diet   Picture Your Plate Scores: <16 Unhealthy dietary pattern with much room for improvement. 41-50 Dietary pattern unlikely  to meet recommendations for good health and room for improvement. 51-60 More healthful dietary pattern, with some room for improvement.  >60 Healthy dietary pattern, although there may be some specific behaviors that could be improved.    Nutrition Goals Re-Evaluation:   Nutrition Goals Discharge (Final Nutrition Goals Re-Evaluation):   Psychosocial: Target Goals: Acknowledge presence or absence of significant depression and/or stress, maximize coping skills, provide positive support  system. Participant is able to verbalize types and ability to use techniques and skills needed for reducing stress and depression.   Education: Stress, Anxiety, and Depression - Group verbal and visual presentation to define topics covered.  Reviews how body is impacted by stress, anxiety, and depression.  Also discusses healthy ways to reduce stress and to treat/manage anxiety and depression.  Written material given at graduation.   Education: Sleep Hygiene -Provides group verbal and written instruction about how sleep can affect your health.  Define sleep hygiene, discuss sleep cycles and impact of sleep habits. Review good sleep hygiene tips.    Initial Review & Psychosocial Screening:  Initial Psych Review & Screening - 08/25/23 1005       Initial Review   Current issues with None Identified      Family Dynamics   Good Support System? Yes    Comments He can look to his wife for support. He is ready to start rehab and get into healthy lifestyle.      Barriers   Psychosocial barriers to participate in program There are no identifiable barriers or psychosocial needs.;The patient should benefit from training in stress management and relaxation.      Screening Interventions   Interventions Encouraged to exercise;To provide support and resources with identified psychosocial needs;Provide feedback about the scores to participant    Expected Outcomes Short Term goal: Utilizing psychosocial counselor, staff and physician to assist with identification of specific Stressors or current issues interfering with healing process. Setting desired goal for each stressor or current issue identified.;Long Term Goal: Stressors or current issues are controlled or eliminated.;Short Term goal: Identification and review with participant of any Quality of Life or Depression concerns found by scoring the questionnaire.;Long Term goal: The participant improves quality of Life and PHQ9 Scores as seen by post scores  and/or verbalization of changes             Quality of Life Scores:   Quality of Life - 09/12/23 1121       Quality of Life   Select Quality of Life      Quality of Life Scores   Health/Function Pre 21.2 %    Socioeconomic Pre 20.64 %    Psych/Spiritual Pre 22.07 %    Family Pre 19.17 %    GLOBAL Pre 21.06 %            Scores of 19 and below usually indicate a poorer quality of life in these areas.  A difference of  2-3 points is a clinically meaningful difference.  A difference of 2-3 points in the total score of the Quality of Life Index has been associated with significant improvement in overall quality of life, self-image, physical symptoms, and general health in studies assessing change in quality of life.  PHQ-9: Review Flowsheet  More data exists      09/06/2023 05/13/2023 05/02/2023 11/12/2022 01/22/2022  Depression screen PHQ 2/9  Decreased Interest 0 1 0 0 0  Down, Depressed, Hopeless 0 0 0 0 0  PHQ - 2 Score 0 1 0  0 0  Altered sleeping 2 1 0 - -  Tired, decreased energy 2 1 0 - -  Change in appetite 0 0 0 - -  Feeling bad or failure about yourself  0 0 0 - -  Trouble concentrating 0 0 0 - -  Moving slowly or fidgety/restless 0 0 0 - -  Suicidal thoughts 0 0 0 - -  PHQ-9 Score 4 3 0 - -  Difficult doing work/chores Somewhat difficult Not difficult at all Not difficult at all - -    Details           Interpretation of Total Score  Total Score Depression Severity:  1-4 = Minimal depression, 5-9 = Mild depression, 10-14 = Moderate depression, 15-19 = Moderately severe depression, 20-27 = Severe depression   Psychosocial Evaluation and Intervention:  Psychosocial Evaluation - 08/25/23 1008       Psychosocial Evaluation & Interventions   Interventions Encouraged to exercise with the program and follow exercise prescription;Relaxation education;Stress management education    Comments He can look to his wife for support. He is ready to start rehab and get  into healthy lifestyle.    Expected Outcomes Short: Start [facility] to help with mood. Long: Maintain a healthy mental state    Continue Psychosocial Services  Follow up required by staff             Psychosocial Re-Evaluation:   Psychosocial Discharge (Final Psychosocial Re-Evaluation):   Vocational Rehabilitation: Provide vocational rehab assistance to qualifying candidates.   Vocational Rehab Evaluation & Intervention:   Education: Education Goals: Education classes will be provided on a variety of topics geared toward better understanding of heart health and risk factor modification. Participant will state understanding/return demonstration of topics presented as noted by education test scores.  Learning Barriers/Preferences:  Learning Barriers/Preferences - 08/25/23 1004       Learning Barriers/Preferences   Learning Barriers None    Learning Preferences None             General Cardiac Education Topics:  AED/CPR: - Group verbal and written instruction with the use of models to demonstrate the basic use of the AED with the basic ABC's of resuscitation.   Anatomy and Cardiac Procedures: - Group verbal and visual presentation and models provide information about basic cardiac anatomy and function. Reviews the testing methods done to diagnose heart disease and the outcomes of the test results. Describes the treatment choices: Medical Management, Angioplasty, or Coronary Bypass Surgery for treating various heart conditions including Myocardial Infarction, Angina, Valve Disease, and Cardiac Arrhythmias.  Written material given at graduation.   Medication Safety: - Group verbal and visual instruction to review commonly prescribed medications for heart and lung disease. Reviews the medication, class of the drug, and side effects. Includes the steps to properly store meds and maintain the prescription regimen.  Written material given at graduation.   Intimacy: -  Group verbal instruction through game format to discuss how heart and lung disease can affect sexual intimacy. Written material given at graduation..   Know Your Numbers and Heart Failure: - Group verbal and visual instruction to discuss disease risk factors for cardiac and pulmonary disease and treatment options.  Reviews associated critical values for Overweight/Obesity, Hypertension, Cholesterol, and Diabetes.  Discusses basics of heart failure: signs/symptoms and treatments.  Introduces Heart Failure Zone chart for action plan for heart failure.  Written material given at graduation.   Infection Prevention: - Provides verbal and written material to individual with  discussion of infection control including proper hand washing and proper equipment cleaning during exercise session. Flowsheet Row Cardiac Rehab from 09/06/2023 in [facility] Cardiac and Pulmonary Rehab  Date 09/06/23  Educator mc  Instruction Review Code 1- Verbalizes Understanding       Falls Prevention: - Provides verbal and written material to individual with discussion of falls prevention and safety. Flowsheet Row Cardiac Rehab from 09/06/2023 in [facility] Cardiac and Pulmonary Rehab  Date 09/06/23  Educator mc  Instruction Review Code 1- Verbalizes Understanding       Other: -Provides group and verbal instruction on various topics (see comments)   Knowledge Questionnaire Score:  Knowledge Questionnaire Score - 09/12/23 1110       Knowledge Questionnaire Score   Pre Score 23             Core Components/Risk Factors/Patient Goals at Admission:  Personal Goals and Risk Factors at Admission - 09/06/23 1556       Core Components/Risk Factors/Patient Goals on Admission    Weight Management Yes;Weight Maintenance    Intervention Weight Management: Develop a combined nutrition and exercise program designed to reach desired caloric intake, while maintaining appropriate intake of nutrient and fiber, sodium and fats, and  appropriate energy expenditure required for the weight goal.;Weight Management: Provide education and appropriate resources to help participant work on and attain dietary goals.;Weight Management/Obesity: Establish reasonable short term and long term weight goals.    Admit Weight 163 lb 8 oz (74.2 kg)    Goal Weight: Short Term 160 lb (72.6 kg)    Goal Weight: Long Term 155 lb (70.3 kg)    Expected Outcomes Short Term: Continue to assess and modify interventions until short term weight is achieved;Long Term: Adherence to nutrition and physical activity/exercise program aimed toward attainment of established weight goal;Weight Maintenance: Understanding of the daily nutrition guidelines, which includes 25-35% calories from fat, 7% or less cal from saturated fats, less than 200mg  cholesterol, less than 1.5gm of sodium, & 5 or more servings of fruits and vegetables daily;Understanding recommendations for meals to include 15-35% energy as protein, 25-35% energy from fat, 35-60% energy from carbohydrates, less than 200mg  of dietary cholesterol, 20-35 gm of total fiber daily;Understanding of distribution of calorie intake throughout the day with the consumption of 4-5 meals/snacks    Hypertension Yes    Intervention Provide education on lifestyle modifcations including regular physical activity/exercise, weight management, moderate sodium restriction and increased consumption of fresh fruit, vegetables, and low fat dairy, alcohol moderation, and smoking cessation.;Monitor prescription use compliance.    Expected Outcomes Short Term: Continued assessment and intervention until BP is < 140/28mm HG in hypertensive participants. < 130/25mm HG in hypertensive participants with diabetes, heart failure or chronic kidney disease.;Long Term: Maintenance of blood pressure at goal levels.             Education:Diabetes - Individual verbal and written instruction to review signs/symptoms of diabetes, desired ranges of  glucose level fasting, after meals and with exercise. Acknowledge that pre and post exercise glucose checks will be done for 3 sessions at entry of program.   Core Components/Risk Factors/Patient Goals Review:    Core Components/Risk Factors/Patient Goals at Discharge (Final Review):    ITP Comments:  ITP Comments     Row Name 08/25/23 1003 09/06/23 1543 09/09/23 1120       ITP Comments Virtual Visit completed. Patient informed on EP and RD appointment and 6 Minute walk test. Patient also informed of patient health questionnaires on  My Chart. Patient Verbalizes understanding. Visit diagnosis can be found in [facility] 07/13/2023. Completed and gym orientation. Initial ITP created and sent for review to Dr. Cristal Ford, Medical Director. First full day of exercise!  Patient was oriented to gym and equipment including functions, settings, policies, and procedures.  Patient's individual exercise prescription and treatment plan were reviewed.  All starting workloads were established based on the results of the 6 minute walk test done at initial orientation visit.  The plan for exercise progression was also introduced and progression will be customized based on patient's performance and goals.              Comments: Initial ITP

## 2023-09-16 ENCOUNTER — Telehealth: Payer: Self-pay | Admitting: Internal Medicine

## 2023-09-16 ENCOUNTER — Encounter: Payer: 59 | Admitting: *Deleted

## 2023-09-16 DIAGNOSIS — Z48812 Encounter for surgical aftercare following surgery on the circulatory system: Secondary | ICD-10-CM | POA: Diagnosis not present

## 2023-09-16 DIAGNOSIS — Z951 Presence of aortocoronary bypass graft: Secondary | ICD-10-CM

## 2023-09-16 NOTE — Progress Notes (Signed)
Daily Session Note  Patient Details  Name: Thomas Harper MRN: [id number] Date of Birth: 1961-08-15 Referring Provider:   Flowsheet Row Cardiac Rehab from 09/06/2023 in [facility] Cardiac and Pulmonary Rehab  Referring Provider Dr. Marikay Alar       Encounter Date: 09/16/2023  Check In:  Session Check In - 09/16/23 1146       Check-In   Supervising physician immediately available to respond to emergencies See telemetry face sheet for immediately available ER MD    Location [facility]-Cardiac & Pulmonary Rehab    Staff Present Cora Collum, RN, BSN, CCRP;Noah Tickle, BS, Exercise Physiologist;Joseph [place], [place]    Virtual Visit No    Medication changes reported     No    Fall or balance concerns reported    No    Warm-up and Cool-down Performed on first and last piece of equipment    Resistance Training Performed Yes    VAD Patient? No    PAD/SET Patient? No      Pain Assessment   Currently in Pain? No/denies                Social History   Tobacco Use  Smoking Status Former   Current packs/day: 0.00   Types: Cigarettes   Quit date: 05/21/2021   Years since quitting: 2.3  Smokeless Tobacco Former    Goals Met:  Independence with exercise equipment Exercise tolerated well No report of concerns or symptoms today  Goals Unmet:  Not Applicable  Comments: Pt able to follow exercise prescription today without complaint.  Will continue to monitor for progression.    Dr. Bethann Punches is Medical Director for [facility] Cardiac Rehabilitation.  Dr. Vida Rigger is Medical Director for [facility] Pulmonary Rehabilitation.

## 2023-09-16 NOTE — Telephone Encounter (Signed)
Patient's wife just called and she was wanted to know what her husband can take for his indigestion over the counter medication. His number is [phone]

## 2023-09-16 NOTE — Telephone Encounter (Signed)
Patient & wife aware of the name of the medication.   Famotidine (Pepcid) 20mg  daily

## 2023-09-18 ENCOUNTER — Encounter: Payer: Self-pay | Admitting: Internal Medicine

## 2023-09-18 NOTE — Assessment & Plan Note (Signed)
S/p removal during recent CT surgery.

## 2023-09-18 NOTE — Assessment & Plan Note (Signed)
Recheck cbc today to confirm stable/improved.  Decreased during recent hospitalization.

## 2023-09-18 NOTE — Assessment & Plan Note (Signed)
Increased reflux as outlined.  Off nexium since starting plavix.  Start protonix.  Add pepcid.  Notify if persistent.

## 2023-09-18 NOTE — Assessment & Plan Note (Signed)
On high dose lipitor. Did not tolerate repatha.  Continue lipitor for now.  Low cholesterol diet and exercise.  Follow lipid panel and liver function tests.

## 2023-09-18 NOTE — Assessment & Plan Note (Addendum)
Blood pressure as outlined.  Continue metoprolol.  Follow pressures.  Follow metabolic panel.

## 2023-09-18 NOTE — Assessment & Plan Note (Signed)
Low carb diet and exercise.  Follow met b and a1c.  

## 2023-09-18 NOTE — Assessment & Plan Note (Signed)
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 Continue risk factor modification. Continue plavix, metoprolol and lipitor. Continue f/u with cardiology. Continue cardiac rehab.

## 2023-09-18 NOTE — Assessment & Plan Note (Signed)
Recheck cbc to confirm wnl.

## 2023-09-19 ENCOUNTER — Telehealth: Payer: Self-pay | Admitting: Cardiology

## 2023-09-19 ENCOUNTER — Encounter: Payer: 59 | Admitting: *Deleted

## 2023-09-19 DIAGNOSIS — Z48812 Encounter for surgical aftercare following surgery on the circulatory system: Secondary | ICD-10-CM | POA: Diagnosis not present

## 2023-09-19 DIAGNOSIS — Z951 Presence of aortocoronary bypass graft: Secondary | ICD-10-CM

## 2023-09-19 MED ORDER — CLOPIDOGREL BISULFATE 75 MG PO TABS: 75.0000 mg | ORAL_TABLET | Freq: Every day | ORAL | 3 refills | Status: DC

## 2023-09-19 NOTE — Telephone Encounter (Signed)
Ok to refill

## 2023-09-19 NOTE — Telephone Encounter (Signed)
Pt c/o medication issue:  1. Name of Medication: Eliquis  2. How are you currently taking this medication (dosage and times per day)?   3. Are you having a reaction (difficulty breathing--STAT)?   4. What is your medication issue? She wants to know if this medicine have been discontinued for this patient

## 2023-09-19 NOTE — Telephone Encounter (Signed)
*  STAT* If patient is at the pharmacy, call can be transferred to refill team.   1. Which medications need to be refilled? (please list name of each medication and dose if known)   clopidogrel (PLAVIX) 75 MG tablet     2. Would you like to learn more about the convenience, safety, & potential cost savings by using the [facility]? No   3. Are you open to using the [facility] (Type [facility].) No   4. Which pharmacy/location (including street and city if local pharmacy) is medication to be sent to?  [facility] - [place], [place] - [address]     5. Do they need a 30 day or 90 day supply? 90 day  Pt is currently out of medication

## 2023-09-19 NOTE — Telephone Encounter (Signed)
Call to [place] at pharmacy . Advised Eliquis Dc'd on 9/16.   While on phone, She ask for plavix refill.  Advised will confirm the med and dose and will send if authorized

## 2023-09-19 NOTE — Progress Notes (Signed)
Daily Session Note  Patient Details  Name: Thomas Harper MRN: [id number] Date of Birth: 1961-04-20 Referring Provider:   Flowsheet Row Cardiac Rehab from 09/06/2023 in [facility] Cardiac and Pulmonary Rehab  Referring Provider Dr. Marikay Alar       Encounter Date: 09/19/2023  Check In:  Session Check In - 09/19/23 1140       Check-In   Supervising physician immediately available to respond to emergencies See telemetry face sheet for immediately available ER MD    Location [facility]-Cardiac & Pulmonary Rehab    Staff Present Rory Percy, MS, Exercise Physiologist;Meredith Jewel Baize, RN BSN;Maxon Conetta BS, , Exercise Physiologist;Kelly Madilyn Fireman, BS, ACSM CEP, Exercise Physiologist;Elon Eoff Katrinka Blazing, RN, ADN    Virtual Visit No    Medication changes reported     No    Fall or balance concerns reported    No    Warm-up and Cool-down Performed on first and last piece of equipment    Resistance Training Performed Yes    VAD Patient? No    PAD/SET Patient? No      Pain Assessment   Currently in Pain? No/denies                Social History   Tobacco Use  Smoking Status Former   Current packs/day: 0.00   Types: Cigarettes   Quit date: 05/21/2021   Years since quitting: 2.3  Smokeless Tobacco Former    Goals Met:  Independence with exercise equipment Exercise tolerated well No report of concerns or symptoms today Strength training completed today  Goals Unmet:  Not Applicable  Comments: Pt able to follow exercise prescription today without complaint.  Will continue to monitor for progression.    Dr. Bethann Punches is Medical Director for [facility] Cardiac Rehabilitation.  Dr. Vida Rigger is Medical Director for [facility] Pulmonary Rehabilitation.

## 2023-09-20 ENCOUNTER — Telehealth: Payer: Self-pay | Admitting: Internal Medicine

## 2023-09-20 ENCOUNTER — Encounter: Payer: Self-pay | Admitting: *Deleted

## 2023-09-20 NOTE — Telephone Encounter (Signed)
Pt called wanting to know where he received his colonoscopy from in january

## 2023-09-21 ENCOUNTER — Encounter: Payer: 59 | Admitting: *Deleted

## 2023-09-21 DIAGNOSIS — Z951 Presence of aortocoronary bypass graft: Secondary | ICD-10-CM

## 2023-09-21 NOTE — Progress Notes (Signed)
Daily Session Note  Patient Details  Name: Thomas Harper MRN: [id number] Date of Birth: 16-Mar-1961 Referring Provider:   Flowsheet Row Cardiac Rehab from 09/06/2023 in [facility] Cardiac and Pulmonary Rehab  Referring Provider Dr. Marikay Alar       Encounter Date: 09/21/2023  Check In:  Session Check In - 09/21/23 1139       Check-In   Supervising physician immediately available to respond to emergencies See telemetry face sheet for immediately available ER MD    Location [facility]-Cardiac & Pulmonary Rehab    Staff Present Bess Kinds RN, BSN;Laureen Manson Passey, BS, RRT, CPFT;Maxon Conetta BS, , Exercise Physiologist;Naquita Nappier Katrinka Blazing, RN, ADN    Virtual Visit No    Medication changes reported     No    Fall or balance concerns reported    No    Warm-up and Cool-down Performed on first and last piece of equipment    Resistance Training Performed Yes    VAD Patient? No    PAD/SET Patient? No      Pain Assessment   Currently in Pain? No/denies                Social History   Tobacco Use  Smoking Status Former   Current packs/day: 0.00   Types: Cigarettes   Quit date: 05/21/2021   Years since quitting: 2.3  Smokeless Tobacco Former    Goals Met:  Independence with exercise equipment Exercise tolerated well No report of concerns or symptoms today Strength training completed today  Goals Unmet:  Not Applicable  Comments: Pt able to follow exercise prescription today without complaint.  Will continue to monitor for progression.    Dr. Bethann Punches is Medical Director for [facility] Cardiac Rehabilitation.  Dr. Vida Rigger is Medical Director for [facility] Pulmonary Rehabilitation.

## 2023-09-22 ENCOUNTER — Other Ambulatory Visit: Payer: Self-pay | Admitting: Internal Medicine

## 2023-09-26 ENCOUNTER — Encounter: Payer: 59 | Admitting: *Deleted

## 2023-09-26 DIAGNOSIS — Z951 Presence of aortocoronary bypass graft: Secondary | ICD-10-CM

## 2023-09-26 DIAGNOSIS — Z48812 Encounter for surgical aftercare following surgery on the circulatory system: Secondary | ICD-10-CM | POA: Diagnosis not present

## 2023-09-26 NOTE — Progress Notes (Signed)
Daily Session Note  Patient Details  Name: ATLEY NEUBERT MRN: [id number] Date of Birth: November 25, 1961 Referring Provider:   Flowsheet Row Cardiac Rehab from 09/06/2023 in [facility] Cardiac and Pulmonary Rehab  Referring Provider Dr. Marikay Alar       Encounter Date: 09/26/2023  Check In:  Session Check In - 09/26/23 1126       Check-In   Supervising physician immediately available to respond to emergencies See telemetry face sheet for immediately available ER MD    Location [facility]-Cardiac & Pulmonary Rehab    Staff Present Rory Percy, MS, Exercise Physiologist;Maxon Suzzette Righter, , Exercise Physiologist;Kelly Madilyn Fireman, BS, ACSM CEP, Exercise Physiologist;Shakara Tweedy Katrinka Blazing, RN, ADN    Virtual Visit No    Medication changes reported     No    Fall or balance concerns reported    No    Warm-up and Cool-down Performed on first and last piece of equipment    Resistance Training Performed Yes    VAD Patient? No    PAD/SET Patient? No      Pain Assessment   Currently in Pain? No/denies                Social History   Tobacco Use  Smoking Status Former   Current packs/day: 0.00   Types: Cigarettes   Quit date: 05/21/2021   Years since quitting: 2.3  Smokeless Tobacco Former    Goals Met:  Independence with exercise equipment Exercise tolerated well No report of concerns or symptoms today Strength training completed today  Goals Unmet:  Not Applicable  Comments: Pt able to follow exercise prescription today without complaint.  Will continue to monitor for progression.    Dr. Bethann Punches is Medical Director for [facility] Cardiac Rehabilitation.  Dr. Vida Rigger is Medical Director for [facility] Pulmonary Rehabilitation.

## 2023-09-28 ENCOUNTER — Ambulatory Visit: Payer: 59 | Admitting: Pulmonary Disease

## 2023-09-28 ENCOUNTER — Encounter: Payer: Self-pay | Admitting: *Deleted

## 2023-09-28 ENCOUNTER — Encounter: Payer: 59 | Attending: Family Medicine | Admitting: *Deleted

## 2023-09-28 DIAGNOSIS — Z951 Presence of aortocoronary bypass graft: Secondary | ICD-10-CM | POA: Insufficient documentation

## 2023-09-28 DIAGNOSIS — M25511 Pain in right shoulder: Secondary | ICD-10-CM | POA: Diagnosis present

## 2023-09-28 DIAGNOSIS — R0609 Other forms of dyspnea: Secondary | ICD-10-CM | POA: Diagnosis not present

## 2023-09-28 DIAGNOSIS — Z48812 Encounter for surgical aftercare following surgery on the circulatory system: Secondary | ICD-10-CM | POA: Insufficient documentation

## 2023-09-28 DIAGNOSIS — M6281 Muscle weakness (generalized): Secondary | ICD-10-CM | POA: Diagnosis present

## 2023-09-28 DIAGNOSIS — M25619 Stiffness of unspecified shoulder, not elsewhere classified: Secondary | ICD-10-CM | POA: Diagnosis present

## 2023-09-28 LAB — PULMONARY FUNCTION TEST
DL/VA % pred: 80 %
DL/VA: 3.45 ml/min/mmHg/L
DLCO cor % pred: 69 %
DLCO cor: 16.78 ml/min/mmHg
DLCO unc % pred: 68 %
DLCO unc: 16.58 ml/min/mmHg
FEF 25-75 Post: 2.24 L/s
FEF 25-75 Pre: 1.63 L/s
FEF2575-%Change-Post: 37 %
FEF2575-%Pred-Post: 89 %
FEF2575-%Pred-Pre: 65 %
FEV1-%Change-Post: 9 %
FEV1-%Pred-Post: 72 %
FEV1-%Pred-Pre: 66 %
FEV1-Post: 2.2 L
FEV1-Pre: 2.02 L
FEV1FVC-%Change-Post: -1 %
FEV1FVC-%Pred-Pre: 100 %
FEV6-%Change-Post: 10 %
FEV6-%Pred-Post: 77 %
FEV6-%Pred-Pre: 69 %
FEV6-Post: 2.97 L
FEV6-Pre: 2.68 L
FEV6FVC-%Pred-Post: 105 %
FEV6FVC-%Pred-Pre: 105 %
FVC-%Change-Post: 10 %
FVC-%Pred-Post: 73 %
FVC-%Pred-Pre: 66 %
FVC-Post: 2.97 L
FVC-Pre: 2.68 L
Post FEV1/FVC ratio: 74 %
Post FEV6/FVC ratio: 100 %
Pre FEV1/FVC ratio: 75 %
Pre FEV6/FVC Ratio: 100 %
RV % pred: 128 %
RV: 2.65 L
TLC % pred: 91 %
TLC: 5.64 L

## 2023-09-28 NOTE — Progress Notes (Signed)
Cardiac Individual Treatment Plan  Patient Details  Name: Thomas Harper MRN: [id number] Date of Birth: 10-20-1961 Referring Provider:   Flowsheet Row Cardiac Rehab from 09/06/2023 in [facility] Cardiac and Pulmonary Rehab  Referring Provider Dr. Marikay Alar       Initial Encounter Date:  Flowsheet Row Cardiac Rehab from 09/06/2023 in [facility] Cardiac and Pulmonary Rehab  Date 09/06/23       Visit Diagnosis: S/P CABG x 3  Patient's Home Medications on Admission:  Current Outpatient Medications:    acetaminophen (TYLENOL) 500 MG tablet, Take 1,000 mg by mouth every 8 (eight) hours as needed for headache., Disp: , Rfl:    aspirin EC 81 MG tablet, Take 1 tablet (81 mg total) by mouth daily. Swallow whole., Disp: 90 tablet, Rfl: 3   atorvastatin (LIPITOR) 80 MG tablet, Take 1 tablet (80 mg total) by mouth daily., Disp: 30 tablet, Rfl: 11   calcium carbonate (TUMS - DOSED IN MG ELEMENTAL CALCIUM) 500 MG chewable tablet, Chew 1 tablet by mouth continuous as needed for indigestion or heartburn., Disp: , Rfl:    clopidogrel (PLAVIX) 75 MG tablet, Take 1 tablet (75 mg total) by mouth daily., Disp: 90 tablet, Rfl: 3   metoprolol tartrate (LOPRESSOR) 50 MG tablet, Take 1 tablet (50 mg total) by mouth 2 (two) times daily., Disp: 180 tablet, Rfl: 3   pantoprazole (PROTONIX) 40 MG tablet, TAKE ONE TABLET (40 MG TOTAL) BY MOUTH DAILY. TAKE 30 MINUTES BEFORE YOUR EVENING MEAL, Disp: 90 tablet, Rfl: 3   traMADol (ULTRAM) 50 MG tablet, Take 1 tablet (50 mg total) by mouth every 6 (six) hours as needed., Disp: 15 tablet, Rfl: 0  Past Medical History: Past Medical History:  Diagnosis Date   Anginal pain (HCC)    Chronic leg pain    s/p injury right leg (age 9)   Coronary artery disease    GERD (gastroesophageal reflux disease)    History of ankle fracture    persistent pain   History of kidney stones    Hypercholesterolemia    Hypertension    Nephrolithiasis    followed by Assunta Gambles   Vitamin D  deficiency     Tobacco Use: Social History   Tobacco Use  Smoking Status Former   Current packs/day: 0.00   Types: Cigarettes   Quit date: 05/21/2021   Years since quitting: 2.3  Smokeless Tobacco Former    Labs: Review Flowsheet  More data exists      Latest Ref Rng & Units 11/12/2022 03/18/2023 07/11/2023 07/13/2023 07/29/2023  Labs for ITP Cardiac and Pulmonary Rehab  Cholestrol 0 - 200 mg/dL 409  811  - - 914   LDL (calc) 0 - 99 mg/dL 782  956  - - 72   HDL-C >39.00 mg/dL 21.30  86.57  - - 84.69   Trlycerides 0.0 - 149.0 mg/dL 62.9  528.4  - - 132.4   Hemoglobin A1c 4.8 - 5.6 % - 6.2  5.9  - -  PH, Arterial 7.35 - 7.45 - - 7.42  7.355  7.358  7.338  7.444  7.362  -  PCO2 arterial 32 - 48 mmHg - - 38  37.6  40.9  45.1  32.1  44.0  -  Bicarbonate 20.0 - 28.0 mmol/L - - 24.6  21.1  23.2  24.2  22.0  25.0  -  TCO2 22 - 32 mmol/L - - - 22  25  24  26  26  27   24  23  24  27  26   -  Acid-base deficit 0.0 - 2.0 mmol/L - - - 4.0  2.0  2.0  2.0  1.0  -  O2 Saturation % - - 99  99  99  99  100  100  -    Details       Multiple values from one day are sorted in reverse-chronological order          Exercise Target Goals: Exercise Program Goal: Individual exercise prescription set using results from initial 6 min walk test and THRR while considering  patient's activity barriers and safety.   Exercise Prescription Goal: Initial exercise prescription builds to 30-45 minutes a day of aerobic activity, 2-3 days per week.  Home exercise guidelines will be given to patient during program as part of exercise prescription that the participant will acknowledge.   Education: Aerobic Exercise: - Group verbal and visual presentation on the components of exercise prescription. Introduces F.I.T.T principle from ACSM for exercise prescriptions.  Reviews F.I.T.T. principles of aerobic exercise including progression. Written material given at graduation.   Education: Resistance Exercise: -  Group verbal and visual presentation on the components of exercise prescription. Introduces F.I.T.T principle from ACSM for exercise prescriptions  Reviews F.I.T.T. principles of resistance exercise including progression. Written material given at graduation.    Education: Exercise & Equipment Safety: - Individual verbal instruction and demonstration of equipment use and safety with use of the equipment. Flowsheet Row Cardiac Rehab from 09/28/2023 in [facility] Cardiac and Pulmonary Rehab  Date 09/06/23  Educator mc  Instruction Review Code 1- Verbalizes Understanding       Education: Exercise Physiology & General Exercise Guidelines: - Group verbal and written instruction with models to review the exercise physiology of the cardiovascular system and associated critical values. Provides general exercise guidelines with specific guidelines to those with heart or lung disease.    Education: Flexibility, Balance, Mind/Body Relaxation: - Group verbal and visual presentation with interactive activity on the components of exercise prescription. Introduces F.I.T.T principle from ACSM for exercise prescriptions. Reviews F.I.T.T. principles of flexibility and balance exercise training including progression. Also discusses the mind body connection.  Reviews various relaxation techniques to help reduce and manage stress (i.e. Deep breathing, progressive muscle relaxation, and visualization). Balance handout provided to take home. Written material given at graduation.   Activity Barriers & Risk Stratification:  Activity Barriers & Cardiac Risk Stratification - 09/06/23 1547       Activity Barriers & Cardiac Risk Stratification   Activity Barriers None    Cardiac Risk Stratification High             6 Minute Walk:  6 Minute Walk     Row Name 09/06/23 1545         6 Minute Walk   Phase Initial     Distance 1200 feet     Walk Time 6 minutes     # of Rest Breaks 0     MPH 2.3     METS 3.1      RPE 11     Perceived Dyspnea  0     VO2 Peak 10.7     Symptoms No     Resting HR 72 bpm     Resting BP 106/64     Resting Oxygen Saturation  96 %     Exercise Oxygen Saturation  during 6 min walk 98 %     Max Ex. HR 107 bpm  Max Ex. BP 114/70     2 Minute Post BP 110/68              Oxygen Initial Assessment:   Oxygen Re-Evaluation:   Oxygen Discharge (Final Oxygen Re-Evaluation):   Initial Exercise Prescription:  Initial Exercise Prescription - 09/06/23 1500       Date of Initial Exercise RX and Referring Provider   Date 09/06/23    Referring Provider Dr. Marikay Alar      Oxygen   Maintain Oxygen Saturation 88% or higher      Treadmill   MPH 2.3    Grade 0    Minutes 15    METs 2.76      NuStep   Level 2    Minutes 15    METs 3.1      REL-XR   Level 2    Minutes 15    METs 3.1      T5 Nustep   Level 2    Minutes 15    METs 3.1      Biostep-RELP   Level 2    Minutes 15    METs 3.1      Prescription Details   Duration Progress to 30 minutes of continuous aerobic without signs/symptoms of physical distress      Intensity   THRR 40-80% of Max Heartrate 106-140    Ratings of Perceived Exertion 11-13    Perceived Dyspnea 0-4      Progression   Progression Continue to progress workloads to maintain intensity without signs/symptoms of physical distress.      Resistance Training   Training Prescription Yes    Weight 5lb    Reps 10-15             Perform Capillary Blood Glucose checks as needed.  Exercise Prescription Changes:   Exercise Prescription Changes     Row Name 09/06/23 1500 09/20/23 1400           Response to Exercise   Blood Pressure (Admit) 106/64 102/58      Blood Pressure (Exercise) 114/70 142/80      Blood Pressure (Exit) 110/68 96/64      Heart Rate (Admit) 72 bpm 86 bpm      Heart Rate (Exercise) 107 bpm 103 bpm      Heart Rate (Exit) 73 bpm 77 bpm      Oxygen Saturation (Admit) 96 % --       Oxygen Saturation (Exercise) 98 % --      Oxygen Saturation (Exit) 98 % --      Rating of Perceived Exertion (Exercise) 11 14      Perceived Dyspnea (Exercise) 0 --      Symptoms none --      Comments Orientation --      Duration Progress to 30 minutes of  aerobic without signs/symptoms of physical distress Continue with 30 min of aerobic exercise without signs/symptoms of physical distress.      Intensity THRR unchanged THRR unchanged        Progression   Progression Continue to progress workloads to maintain intensity without signs/symptoms of physical distress. Continue to progress workloads to maintain intensity without signs/symptoms of physical distress.      Average METs 3.1 3.03        Resistance Training   Training Prescription -- Yes      Weight -- 5lb      Reps -- 10-15  Treadmill   MPH -- 2.6      Grade -- 0      Minutes -- 15      METs -- 2.99        NuStep   Level -- 4      Minutes -- 15      METs -- 4.3        REL-XR   Level -- 3      Minutes -- 15      METs -- 3.1        T5 Nustep   Level -- 3      Minutes -- 15      METs -- 2.3        Biostep-RELP   Level -- 2      Minutes -- 15      METs -- 3        Oxygen   Maintain Oxygen Saturation -- 88% or higher               Exercise Comments:   Exercise Comments     Row Name 09/09/23 1121           Exercise Comments First full day of exercise!  Patient was oriented to gym and equipment including functions, settings, policies, and procedures.  Patient's individual exercise prescription and treatment plan were reviewed.  All starting workloads were established based on the results of the 6 minute walk test done at initial orientation visit.  The plan for exercise progression was also introduced and progression will be customized based on patient's performance and goals.                Exercise Goals and Review:   Exercise Goals     Row Name 09/06/23 1552              Exercise Goals   Increase Physical Activity Yes       Intervention Provide advice, education, support and counseling about physical activity/exercise needs.;Develop an individualized exercise prescription for aerobic and resistive training based on initial evaluation findings, risk stratification, comorbidities and participant's personal goals.       Expected Outcomes Long Term: Exercising regularly at least 3-5 days a week.;Long Term: Add in home exercise to make exercise part of routine and to increase amount of physical activity.;Short Term: Attend rehab on a regular basis to increase amount of physical activity.       Increase Strength and Stamina Yes       Intervention Provide advice, education, support and counseling about physical activity/exercise needs.;Develop an individualized exercise prescription for aerobic and resistive training based on initial evaluation findings, risk stratification, comorbidities and participant's personal goals.       Expected Outcomes Long Term: Improve cardiorespiratory fitness, muscular endurance and strength as measured by increased METs and functional capacity ( );Short Term: Perform resistance training exercises routinely during rehab and add in resistance training at home;Short Term: Increase workloads from initial exercise prescription for resistance, speed, and METs.       Able to understand and use rate of perceived exertion (RPE) scale Yes       Intervention Provide education and explanation on how to use RPE scale       Expected Outcomes Long Term:  Able to use RPE to guide intensity level when exercising independently;Short Term: Able to use RPE daily in rehab to express subjective intensity level       Able to understand and use Dyspnea scale Yes  Intervention Provide education and explanation on how to use Dyspnea scale       Expected Outcomes Long Term: Able to use Dyspnea scale to guide intensity level when exercising independently;Short Term:  Able to use Dyspnea scale daily in rehab to express subjective sense of shortness of breath during exertion       Knowledge and understanding of Target Heart Rate Range (THRR) Yes       Intervention Provide education and explanation of THRR including how the numbers were predicted and where they are located for reference       Expected Outcomes Long Term: Able to use THRR to govern intensity when exercising independently;Short Term: Able to use daily as guideline for intensity in rehab;Short Term: Able to state/look up THRR       Able to check pulse independently Yes       Intervention Review the importance of being able to check your own pulse for safety during independent exercise;Provide education and demonstration on how to check pulse in carotid and radial arteries.       Expected Outcomes Long Term: Able to check pulse independently and accurately;Short Term: Able to explain why pulse checking is important during independent exercise       Understanding of Exercise Prescription Yes       Intervention Provide education, explanation, and written materials on patient's individual exercise prescription       Expected Outcomes Short Term: Able to explain program exercise prescription;Long Term: Able to explain home exercise prescription to exercise independently                Exercise Goals Re-Evaluation :  Exercise Goals Re-Evaluation     Row Name 09/09/23 1121 09/20/23 1459           Exercise Goal Re-Evaluation   Exercise Goals Review Able to understand and use rate of perceived exertion (RPE) scale;Knowledge and understanding of Target Heart Rate Range (THRR);Able to understand and use Dyspnea scale;Understanding of Exercise Prescription Increase Physical Activity;Understanding of Exercise Prescription;Increase Strength and Stamina      Comments Reviewed RPE  and dyspnea scale, THR and program prescription with pt today.  Pt voiced understanding and was given a copy of goals to take  home. Arkin is off to a good start in the program. He has had 4 exercise sessions so far. He has increased the treadmill to a speed of 2.6. We will continue to monitor his progress in the program.      Expected Outcomes Short: Use RPE daily to regulate intensity. Long: Follow program prescription in THR. Short: Continue to follow current exercise prescription and progressively increase workloads. Long: Continue exercise to improve strength and stamina.               Discharge Exercise Prescription (Final Exercise Prescription Changes):  Exercise Prescription Changes - 09/20/23 1400       Response to Exercise   Blood Pressure (Admit) 102/58    Blood Pressure (Exercise) 142/80    Blood Pressure (Exit) 96/64    Heart Rate (Admit) 86 bpm    Heart Rate (Exercise) 103 bpm    Heart Rate (Exit) 77 bpm    Rating of Perceived Exertion (Exercise) 14    Duration Continue with 30 min of aerobic exercise without signs/symptoms of physical distress.    Intensity THRR unchanged      Progression   Progression Continue to progress workloads to maintain intensity without signs/symptoms of physical distress.    Average  METs 3.03      Resistance Training   Training Prescription Yes    Weight 5lb    Reps 10-15      Treadmill   MPH 2.6    Grade 0    Minutes 15    METs 2.99      NuStep   Level 4    Minutes 15    METs 4.3      REL-XR   Level 3    Minutes 15    METs 3.1      T5 Nustep   Level 3    Minutes 15    METs 2.3      Biostep-RELP   Level 2    Minutes 15    METs 3      Oxygen   Maintain Oxygen Saturation 88% or higher             Nutrition:  Target Goals: Understanding of nutrition guidelines, daily intake of sodium 1500mg , cholesterol 200mg , calories 30% from fat and 7% or less from saturated fats, daily to have 5 or more servings of fruits and vegetables.  Education: All About Nutrition: -Group instruction provided by verbal, written material, interactive  activities, discussions, models, and posters to present general guidelines for heart healthy nutrition including fat, fiber, MyPlate, the role of sodium in heart healthy nutrition, utilization of the nutrition label, and utilization of this knowledge for meal planning. Follow up email sent as well. Written material given at graduation.   Biometrics:  Pre Biometrics - 09/06/23 1553       Pre Biometrics   Height 5\' 5"  (1.651 m)    Weight 163 lb 8 oz (74.2 kg)    Waist Circumference 37 inches    Hip Circumference 37.5 inches    Waist to Hip Ratio 0.99 %    BMI (Calculated) 27.21    Single Leg Stand 30 seconds              Nutrition Therapy Plan and Nutrition Goals:   Nutrition Assessments:  MEDIFICTS Score Key: >=70 Need to make dietary changes  40-70 Heart Healthy Diet <= 40 Therapeutic Level Cholesterol Diet  Flowsheet Row Cardiac Rehab from 09/12/2023 in [facility] Cardiac and Pulmonary Rehab  Picture Your Plate Total Score on Admission 66      Picture Your Plate Scores: <78 Unhealthy dietary pattern with much room for improvement. 41-50 Dietary pattern unlikely to meet recommendations for good health and room for improvement. 51-60 More healthful dietary pattern, with some room for improvement.  >60 Healthy dietary pattern, although there may be some specific behaviors that could be improved.    Nutrition Goals Re-Evaluation:   Nutrition Goals Discharge (Final Nutrition Goals Re-Evaluation):   Psychosocial: Target Goals: Acknowledge presence or absence of significant depression and/or stress, maximize coping skills, provide positive support system. Participant is able to verbalize types and ability to use techniques and skills needed for reducing stress and depression.   Education: Stress, Anxiety, and Depression - Group verbal and visual presentation to define topics covered.  Reviews how body is impacted by stress, anxiety, and depression.  Also discusses healthy  ways to reduce stress and to treat/manage anxiety and depression.  Written material given at graduation.   Education: Sleep Hygiene -Provides group verbal and written instruction about how sleep can affect your health.  Define sleep hygiene, discuss sleep cycles and impact of sleep habits. Review good sleep hygiene tips.    Initial Review & Psychosocial Screening:  Initial  Psych Review & Screening - 08/25/23 1005       Initial Review   Current issues with None Identified      Family Dynamics   Good Support System? Yes    Comments He can look to his wife for support. He is ready to start rehab and get into healthy lifestyle.      Barriers   Psychosocial barriers to participate in program There are no identifiable barriers or psychosocial needs.;The patient should benefit from training in stress management and relaxation.      Screening Interventions   Interventions Encouraged to exercise;To provide support and resources with identified psychosocial needs;Provide feedback about the scores to participant    Expected Outcomes Short Term goal: Utilizing psychosocial counselor, staff and physician to assist with identification of specific Stressors or current issues interfering with healing process. Setting desired goal for each stressor or current issue identified.;Long Term Goal: Stressors or current issues are controlled or eliminated.;Short Term goal: Identification and review with participant of any Quality of Life or Depression concerns found by scoring the questionnaire.;Long Term goal: The participant improves quality of Life and PHQ9 Scores as seen by post scores and/or verbalization of changes             Quality of Life Scores:   Quality of Life - 09/12/23 1121       Quality of Life   Select Quality of Life      Quality of Life Scores   Health/Function Pre 21.2 %    Socioeconomic Pre 20.64 %    Psych/Spiritual Pre 22.07 %    Family Pre 19.17 %    GLOBAL Pre 21.06 %             Scores of 19 and below usually indicate a poorer quality of life in these areas.  A difference of  2-3 points is a clinically meaningful difference.  A difference of 2-3 points in the total score of the Quality of Life Index has been associated with significant improvement in overall quality of life, self-image, physical symptoms, and general health in studies assessing change in quality of life.  PHQ-9: Review Flowsheet  More data exists      09/06/2023 05/13/2023 05/02/2023 11/12/2022 01/22/2022  Depression screen PHQ 2/9  Decreased Interest 0 1 0 0 0  Down, Depressed, Hopeless 0 0 0 0 0  PHQ - 2 Score 0 1 0 0 0  Altered sleeping 2 1 0 - -  Tired, decreased energy 2 1 0 - -  Change in appetite 0 0 0 - -  Feeling bad or failure about yourself  0 0 0 - -  Trouble concentrating 0 0 0 - -  Moving slowly or fidgety/restless 0 0 0 - -  Suicidal thoughts 0 0 0 - -  PHQ-9 Score 4 3 0 - -  Difficult doing work/chores Somewhat difficult Not difficult at all Not difficult at all - -    Details           Interpretation of Total Score  Total Score Depression Severity:  1-4 = Minimal depression, 5-9 = Mild depression, 10-14 = Moderate depression, 15-19 = Moderately severe depression, 20-27 = Severe depression   Psychosocial Evaluation and Intervention:  Psychosocial Evaluation - 08/25/23 1008       Psychosocial Evaluation & Interventions   Interventions Encouraged to exercise with the program and follow exercise prescription;Relaxation education;Stress management education    Comments He can look to his wife for  support. He is ready to start rehab and get into healthy lifestyle.    Expected Outcomes Short: Start [facility] to help with mood. Long: Maintain a healthy mental state    Continue Psychosocial Services  Follow up required by staff             Psychosocial Re-Evaluation:   Psychosocial Discharge (Final Psychosocial Re-Evaluation):   Vocational  Rehabilitation: Provide vocational rehab assistance to qualifying candidates.   Vocational Rehab Evaluation & Intervention:   Education: Education Goals: Education classes will be provided on a variety of topics geared toward better understanding of heart health and risk factor modification. Participant will state understanding/return demonstration of topics presented as noted by education test scores.  Learning Barriers/Preferences:  Learning Barriers/Preferences - 08/25/23 1004       Learning Barriers/Preferences   Learning Barriers None    Learning Preferences None             General Cardiac Education Topics:  AED/CPR: - Group verbal and written instruction with the use of models to demonstrate the basic use of the AED with the basic ABC's of resuscitation.   Anatomy and Cardiac Procedures: - Group verbal and visual presentation and models provide information about basic cardiac anatomy and function. Reviews the testing methods done to diagnose heart disease and the outcomes of the test results. Describes the treatment choices: Medical Management, Angioplasty, or Coronary Bypass Surgery for treating various heart conditions including Myocardial Infarction, Angina, Valve Disease, and Cardiac Arrhythmias.  Written material given at graduation.   Medication Safety: - Group verbal and visual instruction to review commonly prescribed medications for heart and lung disease. Reviews the medication, class of the drug, and side effects. Includes the steps to properly store meds and maintain the prescription regimen.  Written material given at graduation. Flowsheet Row Cardiac Rehab from 09/28/2023 in [facility] Cardiac and Pulmonary Rehab  Date 09/14/23  Educator SB  Instruction Review Code 1- Verbalizes Understanding       Intimacy: - Group verbal instruction through game format to discuss how heart and lung disease can affect sexual intimacy. Written material given at  graduation..   Know Your Numbers and Heart Failure: - Group verbal and visual instruction to discuss disease risk factors for cardiac and pulmonary disease and treatment options.  Reviews associated critical values for Overweight/Obesity, Hypertension, Cholesterol, and Diabetes.  Discusses basics of heart failure: signs/symptoms and treatments.  Introduces Heart Failure Zone chart for action plan for heart failure.  Written material given at graduation. Flowsheet Row Cardiac Rehab from 09/28/2023 in [facility] Cardiac and Pulmonary Rehab  Date 09/28/23  Educator SB  Instruction Review Code 1- Verbalizes Understanding       Infection Prevention: - Provides verbal and written material to individual with discussion of infection control including proper hand washing and proper equipment cleaning during exercise session. Flowsheet Row Cardiac Rehab from 09/28/2023 in [facility] Cardiac and Pulmonary Rehab  Date 09/06/23  Educator mc  Instruction Review Code 1- Verbalizes Understanding       Falls Prevention: - Provides verbal and written material to individual with discussion of falls prevention and safety. Flowsheet Row Cardiac Rehab from 09/28/2023 in [facility]  Date 09/06/23  Educator mc  Instruction Review Code 1- Verbalizes Understanding       Other: -Provides group and verbal instruction on various topics (see comments)   Knowledge Questionnaire Score:  Knowledge Questionnaire Score - 09/12/23 1110       Knowledge Questionnaire Score  Pre Score 23             Core Components/Risk Factors/Patient Goals at Admission:  Personal Goals and Risk Factors at Admission - 09/06/23 1556       Core Components/Risk Factors/Patient Goals on Admission    Weight Management Yes;Weight Maintenance    Intervention Weight Management: Develop a combined nutrition and exercise program designed to reach desired caloric intake, while maintaining appropriate intake of  nutrient and fiber, sodium and fats, and appropriate energy expenditure required for the weight goal.;Weight Management: Provide education and appropriate resources to help participant work on and attain dietary goals.;Weight Management/Obesity: Establish reasonable short term and long term weight goals.    Admit Weight 163 lb 8 oz (74.2 kg)    Goal Weight: Short Term 160 lb (72.6 kg)    Goal Weight: Long Term 155 lb (70.3 kg)    Expected Outcomes Short Term: Continue to assess and modify interventions until short term weight is achieved;Long Term: Adherence to nutrition and physical activity/exercise program aimed toward attainment of established weight goal;Weight Maintenance: Understanding of the daily nutrition guidelines, which includes 25-35% calories from fat, 7% or less cal from saturated fats, less than 200mg  cholesterol, less than 1.5gm of sodium, & 5 or more servings of fruits and vegetables daily;Understanding recommendations for meals to include 15-35% energy as protein, 25-35% energy from fat, 35-60% energy from carbohydrates, less than 200mg  of dietary cholesterol, 20-35 gm of total fiber daily;Understanding of distribution of calorie intake throughout the day with the consumption of 4-5 meals/snacks    Hypertension Yes    Intervention Provide education on lifestyle modifcations including regular physical activity/exercise, weight management, moderate sodium restriction and increased consumption of fresh fruit, vegetables, and low fat dairy, alcohol moderation, and smoking cessation.;Monitor prescription use compliance.    Expected Outcomes Short Term: Continued assessment and intervention until BP is < 140/49mm HG in hypertensive participants. < 130/80mm HG in hypertensive participants with diabetes, heart failure or chronic kidney disease.;Long Term: Maintenance of blood pressure at goal levels.             Education:Diabetes - Individual verbal and written instruction to review  signs/symptoms of diabetes, desired ranges of glucose level fasting, after meals and with exercise. Acknowledge that pre and post exercise glucose checks will be done for 3 sessions at entry of program.   Core Components/Risk Factors/Patient Goals Review:    Core Components/Risk Factors/Patient Goals at Discharge (Final Review):    ITP Comments:  ITP Comments     Row Name 08/25/23 1003 09/06/23 1543 09/09/23 1120 09/28/23 1258     ITP Comments Virtual Visit completed. Patient informed on EP and RD appointment and 6 Minute walk test. Patient also informed of patient health questionnaires on My Chart. Patient Verbalizes understanding. Visit diagnosis can be found in [facility] 07/13/2023. Completed and gym orientation. Initial ITP created and sent for review to Dr. Cristal Ford, Medical Director. First full day of exercise!  Patient was oriented to gym and equipment including functions, settings, policies, and procedures.  Patient's individual exercise prescription and treatment plan were reviewed.  All starting workloads were established based on the results of the 6 minute walk test done at initial orientation visit.  The plan for exercise progression was also introduced and progression will be customized based on patient's performance and goals. 30 Day review completed. Medical Director ITP review done, changes made as directed, and signed approval by Medical Director.    new to program  Comments:

## 2023-09-28 NOTE — Progress Notes (Signed)
Full PFT performed today. °

## 2023-09-28 NOTE — Patient Instructions (Signed)
Full PFT performed today. °

## 2023-09-28 NOTE — Progress Notes (Signed)
Daily Session Note  Patient Details  Name: Thomas Harper MRN: [id number] Date of Birth: 10-09-1961 Referring Provider:   Flowsheet Row Cardiac Rehab from 09/06/2023 in [facility] Cardiac and Pulmonary Rehab  Referring Provider Dr. Marikay Alar       Encounter Date: 09/28/2023  Check In:  Session Check In - 09/28/23 1114       Check-In   Supervising physician immediately available to respond to emergencies See telemetry face sheet for immediately available ER MD    Location [facility]-Cardiac & Pulmonary Rehab    Staff Present Rory Percy, MS, Exercise Physiologist;Joseph Shelbie Proctor, RN, ADN    Virtual Visit No    Medication changes reported     No    Fall or balance concerns reported    No    Warm-up and Cool-down Performed on first and last piece of equipment    Resistance Training Performed Yes    VAD Patient? No    PAD/SET Patient? No      Pain Assessment   Currently in Pain? No/denies                Social History   Tobacco Use  Smoking Status Former   Current packs/day: 0.00   Types: Cigarettes   Quit date: 05/21/2021   Years since quitting: 2.3  Smokeless Tobacco Former    Goals Met:  Independence with exercise equipment Exercise tolerated well No report of concerns or symptoms today Strength training completed today  Goals Unmet:  Not Applicable  Comments: Pt able to follow exercise prescription today without complaint.  Will continue to monitor for progression.    Dr. Bethann Punches is Medical Director for [facility] Cardiac Rehabilitation.  Dr. Vida Rigger is Medical Director for [facility] Pulmonary Rehabilitation.

## 2023-09-28 NOTE — Progress Notes (Signed)
Cardiology Office Note:  .   Date:  10/11/2023  ID:  Thomas Harper, DOB 10/22/1961, MRN [id number] PCP: Thomas Talco, MD  [facility] Providers Cardiologist:  Thomas Ishikawa, MD  History of Present Illness: .   Thomas Harper is a 62 y.o. male with past medical history of CAD, hypertension, atrial myxoma, GERD, postop atrial flutter.  Patient is followed by Dr. Gaynelle Harper and presents today for a follow-up appointment.   Per chart review, patient established care with Dr. Bjorn Harper in 04/2023 for evaluation of chest pain. He underwent coronary CTA on 06/03/23 that showed coronary calcium score of 434 (86th percentile), findings concerning for left atrial myxoma. There was severe stenosis noted, and FFR confirmed significant functional stenosis in the LAD, LCX, and RCA. Patient underwent echocardiogram on 06/08/23 that showed EF 60-65%, no regional wall motion abnormalities, grade I DD, normal RV systolic function, a 22-16 mm mass attached to the intraatrial septum on the LA side, mild MR. He was sent for left heart catheterization on 06/09/23 that revealed multivessel CAD. Patient was referred to CT surgery for outpatient surgical consultation. He underwent CABGx3 with LIMA-LAD, reverse SVG-PDA and OM, and left atrial myxoma resection on 7/17. His postoperative course was uneventful and he was discharged on 07/18/23. He was discharged on ASA, lipitor, plavix, lasix for 4 days, metoprolol succinate.    Patient was seen in clinic on 8/7.  At that time, EKG 2:1 atrial flutter.  His metoprolol to tartrate was increased to 25 mg twice daily and he was started on Eliquis.  Remained on aspirin, Plavix was discontinued.  When he was seen back in clinic on 8/13, an EKG showed sinus tachycardia.  Patient admitted to drinking a lot of caffeine, and he was instructed to cut back on his caffeine intake.  Most recently seen in clinic on 09/12/23. At that time, he was maintaining NSR. Eliquis was  discontinued. He remained on ASA, plavix, metoprolol lipitor.   On interview today, patient reports that he has been doing well from a cardiac perspective.  He continues to participate in cardiac rehab.  Denies chest pain on exertion.  Rarely, he will have a "twinge" of chest pain at the bottom of his sternum near her surgical incision. Since his surgery, he has had some pain in his right shoulder. Now working with PT.  Denies shortness of breath.  Denies palpitations, tachycardia.  He monitors his heart rate with an Apple Watch, and heart rate is usually in the 60s.  Denies syncope, near syncope.  Occasionally will get dizzy if he stands up too fast, but otherwise denies dizziness.  He is hoping to return to work next week.  Denies bleeding on aspirin, Plavix  ROS: Denies chest pain, shortness of breath, ankle edema, syncope, near syncope, palpitations, elevated HR   Studies Reviewed: .   Cardiac Studies & Procedures   CARDIAC CATHETERIZATION  CARDIAC CATHETERIZATION 06/09/2023  Narrative   Prox RCA to Mid RCA lesion is 99% stenosed.   Prox Cx lesion is 20% stenosed.   Prox Cx to Mid Cx lesion is 40% stenosed.   Mid Cx to Dist Cx lesion is 75% stenosed.   Mid Cx lesion is 70% stenosed.   Mid LAD-1 lesion is 20% stenosed.   Mid LAD-2 lesion is 50% stenosed.   Mid LAD to Dist LAD lesion is 90% stenosed.   The left ventricular systolic function is normal.   LV end diastolic pressure is normal.   The  left ventricular ejection fraction is 50-55% by visual estimate.   Recommend Aspirin 81mg  daily for moderate CAD.  Multivessel CAD with high-grade noncalcified mid 50 to focally 90% LAD stenosis,, diffuse moderate circumflex disease with 75% focal stenosis after marginal vessel, and subtotal mid RCA stenosis with both bridging right to right and left-to-right collaterals supplying a large PDA PLA system.  Low normal LV function with EF estimated at 50 to 55% with subtle focal mid anterolateral  wall motion abnormality; LVEDP 12 mm.  RECOMMENDATION: Plan for outpatient surgical consultation in this patient with left atrial myxoma and multivessel CAD.  He has been on amlodipine.  Will initiate beta-blocker and nitrate therapy.  Will also change lovastatin to high potency atorvastatin 80 mg.  Findings Coronary Findings Diagnostic  Dominance: Right  Left Anterior Descending Mid LAD-1 lesion is 20% stenosed. Mid LAD-2 lesion is 50% stenosed. Mid LAD to Dist LAD lesion is 90% stenosed.  Third Septal Branch Vessel is small in size.  Left Circumflex Prox Cx lesion is 20% stenosed. Prox Cx to Mid Cx lesion is 40% stenosed. Mid Cx lesion is 70% stenosed. Mid Cx to Dist Cx lesion is 75% stenosed.  Fourth Obtuse Marginal Branch Vessel is small in size.  Right Coronary Artery Prox RCA to Mid RCA lesion is 99% stenosed.  Right Ventricular Branch Vessel is small in size.  Right Posterior Atrioventricular Artery Collaterals RPAV filled by collaterals from RV Branch.  Third Right Posterolateral Branch Collaterals 3rd RPL filled by collaterals from 1st Mrg.  Collaterals 3rd RPL filled by collaterals from 2nd Sept.  Intervention  No interventions have been documented.     ECHOCARDIOGRAM  ECHOCARDIOGRAM COMPLETE 06/08/2023  Narrative ECHOCARDIOGRAM REPORT    Patient Name:   Thomas Harper Date of Exam: 06/08/2023 Medical Rec #:  [id number]       Height:       66.0 in Accession #:    [id number]      Weight:       166.2 lb Date of Birth:  05-24-1961       BSA:          1.848 m Patient Age:    62 years        BP:           132/86 mmHg Patient Gender: M               HR:           72 bpm. Exam Location:  [place]  Procedure: 2D Echo, 3D Echo, Color Doppler, Cardiac Doppler and Strain Analysis  Indications:    R06.9 DOE; R07.2 Precordial pain  History:        Patient has no prior history of Echocardiogram examinations. CAD, Signs/Symptoms:Chest Pain and Dyspnea;  Risk Factors:Hypertension, Dyslipidemia, Former Smoker and Family History of Coronary Artery Disease. Patient complains of chest discomfort with DOE. He denies leg edema.  Sonographer:    Thomas Harper RVT, RDCS (AE), RDMS Referring Phys: [phone] CHRISTOPHER L SCHUMANN  IMPRESSIONS   1. Left ventricular ejection fraction, by estimation, is 60 to 65%. The left ventricle has normal function. The left ventricle has no regional wall motion abnormalities. Left ventricular diastolic parameters are consistent with Grade I diastolic dysfunction (impaired relaxation). 2. Right ventricular systolic function is normal. The right ventricular size is normal. 3. Mass measuring 22x16 mm attached to intraatrial sepum on the left atrial side noted. Differencial should include myxoma - most likely v/s thrombus. 4. The mitral  valve is normal in structure. Mild mitral valve regurgitation. No evidence of mitral stenosis. 5. The aortic valve is normal in structure. Aortic valve regurgitation is not visualized. No aortic stenosis is present. 6. The inferior vena cava is normal in size with greater than 50% respiratory variability, suggesting right atrial pressure of 3 mmHg.  Comparison(s): CT calcium score 06/03/2023- Pedunculated well circumscribed 20 X 24 mm x 16 mm mass attached to the fossa ovalis of the left atrial septum with a small stalk. This is concerning for atrial myxoma.   FINDINGS Left Ventricle: Left ventricular ejection fraction, by estimation, is 60 to 65%. The left ventricle has normal function. The left ventricle has no regional wall motion abnormalities. The left ventricular internal cavity size was normal in size. There is no left ventricular hypertrophy. Left ventricular diastolic parameters are consistent with Grade I diastolic dysfunction (impaired relaxation).  Right Ventricle: The right ventricular size is normal. No increase in right ventricular wall thickness. Right ventricular systolic  function is normal.  Left Atrium: Mass measuring 22x16 mm attached to intraatrial sepum on the left atrial side noted. Differencial should include myxoma - most likely v/s thrombus. Left atrial size was normal in size.  Right Atrium: Right atrial size was normal in size.  Pericardium: There is no evidence of pericardial effusion.  Mitral Valve: The mitral valve is normal in structure. Mild mitral valve regurgitation. No evidence of mitral valve stenosis.  Tricuspid Valve: The tricuspid valve is normal in structure. Tricuspid valve regurgitation is not demonstrated. No evidence of tricuspid stenosis.  Aortic Valve: The aortic valve is normal in structure. Aortic valve regurgitation is not visualized. No aortic stenosis is present. Aortic valve mean gradient measures 3.0 mmHg. Aortic valve peak gradient measures 6.8 mmHg. Aortic valve area, by VTI measures 2.57 cm.  Pulmonic Valve: The pulmonic valve was normal in structure. Pulmonic valve regurgitation is not visualized. No evidence of pulmonic stenosis.  Aorta: The aortic root is normal in size and structure.  Venous: The inferior vena cava is normal in size with greater than 50% respiratory variability, suggesting right atrial pressure of 3 mmHg.  IAS/Shunts: No atrial level shunt detected by color flow Doppler.   LEFT VENTRICLE PLAX 2D LVIDd:         5.10 cm   Diastology LVIDs:         3.80 cm   LV e' medial:    7.29 cm/s LV PW:         0.90 cm   LV E/e' medial:  8.8 LV IVS:        0.90 cm   LV e' lateral:   10.80 cm/s LVOT diam:     2.10 cm   LV E/e' lateral: 5.9 LV SV:         51 LV SV Index:   28 LVOT Area:     3.46 cm  3D Volume EF: 3D EF:        54 % LV EDV:       108 ml LV ESV:       49 ml LV SV:        59 ml  RIGHT VENTRICLE RV S prime:     13.90 cm/s TAPSE (M-mode): 2.2 cm  LEFT ATRIUM             Index        RIGHT ATRIUM           Index LA diam:  3.00 cm 1.62 cm/m   RA Area:     16.50 cm LA Vol  (A2C):   41.3 ml 22.34 ml/m  RA Volume:   45.40 ml  24.56 ml/m LA Vol (A4C):   48.2 ml 26.08 ml/m LA Biplane Vol: 44.8 ml 24.24 ml/m AORTIC VALVE                    PULMONIC VALVE AV Area (Vmax):    2.36 cm     PV Vmax:       0.80 m/s AV Area (Vmean):   2.53 cm     PV Peak grad:  2.6 mmHg AV Area (VTI):     2.57 cm AV Vmax:           130.00 cm/s AV Vmean:          74.200 cm/s AV VTI:            0.198 m AV Peak Grad:      6.8 mmHg AV Mean Grad:      3.0 mmHg LVOT Vmax:         88.70 cm/s LVOT Vmean:        54.200 cm/s LVOT VTI:          0.147 m LVOT/AV VTI ratio: 0.74  AORTA Ao Root diam: 3.50 cm Ao Arch diam: 2.8 cm  MITRAL VALVE MV Area (PHT): 3.65 cm    SHUNTS MV Decel Time: 208 msec    Systemic VTI:  0.15 m MV E velocity: 64.00 cm/s  Systemic Diam: 2.10 cm MV A velocity: 89.70 cm/s MV E/A ratio:  0.71  Gypsy Balsam MD Electronically signed by Gypsy Balsam MD Signature Date/Time: 06/08/2023/4:54:17 PM    Final   TEE  ECHO INTRAOPERATIVE TEE 07/13/2023  Narrative *INTRAOPERATIVE TRANSESOPHAGEAL REPORT *    Patient Name:   Shloima L Thompson Date of Exam: 07/13/2023 Medical Rec #:  [id number]       Height:       66.0 in Accession #:    [id number]      Weight:       167.0 lb Date of Birth:  10-Apr-1961       BSA:          1.85 m Patient Age:    62 years        BP:           144/87 mmHg Patient Gender: M               HR:           83 bpm. Exam Location:  Anesthesiology  Transesophogeal exam was perform intraoperatively during surgical procedure. Patient was closely monitored under general anesthesia during the entirety of examination.  Indications:     Left atrial myxoma Performing Phys: [id number] Eliezer Lofts LIGHTFOOT Diagnosing Phys: Val Eagle MD  Complications: No known complications during this procedure. POST-OP IMPRESSIONS _ Left Ventricle: has normal systolic function, with an ejection fraction of 65%. The cavity size was normal. The  wall motion is normal. _ Right Ventricle: normal function. The cavity was normal. The wall motion is normal. _ Aorta: there is no dissection present in the aorta. _ Aortic Valve: The aortic valve appears unchanged from pre-bypass. _ Mitral Valve: The mitral valve appears unchanged from pre-bypass. _ Tricuspid Valve: The tricuspid valve appears unchanged from pre-bypass. _ Comments: previously seen LA myxoma no longer present. No ASD or PFO noted post removal.  PRE-OP FINDINGS Left Ventricle:  The left ventricle has normal systolic function, with an ejection fraction of 60-65%. The cavity size was normal. No evidence of left ventricular regional wall motion abnormalities. There is no left ventricular hypertrophy.   Right Ventricle: The right ventricle has normal systolic function. The cavity was normal. There is no increase in right ventricular wall thickness.  Left Atrium: Left atrial size was normal in size. No left atrial/left atrial appendage thrombus was detected. 1.6 x 1.88/2 cm myxoma attached to LA septum. The left atrial appendage is well visualized and there is no evidence of thrombus present. Left atrial appendage velocity is normal at greater than 40 cm/s.  Right Atrium: Right atrial size was normal in size.  Interatrial Septum: No atrial level shunt detected by color flow Doppler. There is no evidence of a patent foramen ovale.  Pericardium: There is no evidence of pericardial effusion.  Mitral Valve: The mitral valve is normal in structure. Mitral valve regurgitation is mild by color flow Doppler. The MR jet is centrally-directed. There is No evidence of mitral stenosis.  Tricuspid Valve: The tricuspid valve was normal in structure. Tricuspid valve regurgitation is trivial by color flow Doppler. No evidence of tricuspid stenosis is present.  Aortic Valve: The aortic valve is tricuspid Aortic valve regurgitation is trivial by color flow Doppler. There is no stenosis of the  aortic valve, with a calculated valve area of 2.41 cm.   Pulmonic Valve: The pulmonic valve was normal in structure, with normal. No evidence of pumonic stenosis. Pulmonic valve regurgitation is not visualized by color flow Doppler.   Aorta: There is evidence of plaque in the descending aorta; Grade I, measuring 1-20mm in size. There is evidence of a dissection in the none.  +--------------+--------++ LEFT VENTRICLE         +--------------+--------++ PLAX 2D                +--------------+--------++ LVOT diam:    2.40 cm  +--------------+--------++ LVOT Area:    4.52 cm +--------------+--------++                        +--------------+--------++  +------------------+-----------++ AORTIC VALVE                  +------------------+-----------++ AV Area (Vmax):   2.32 cm    +------------------+-----------++ AV Area (Vmean):  2.36 cm    +------------------+-----------++ AV Area (VTI):    2.41 cm    +------------------+-----------++ AV Vmax:          139.00 cm/s +------------------+-----------++ AV Vmean:         83.800 cm/s +------------------+-----------++ AV VTI:           0.283 m     +------------------+-----------++ AV Peak Grad:     7.7 mmHg    +------------------+-----------++ AV Mean Grad:     3.0 mmHg    +------------------+-----------++ LVOT Vmax:        71.30 cm/s  +------------------+-----------++ LVOT Vmean:       43.800 cm/s +------------------+-----------++ LVOT VTI:         0.150 m     +------------------+-----------++ LVOT/AV VTI ratio:0.53        +------------------+-----------++  +--------------+-------++ AORTA                 +--------------+-------++ Ao Sinus diam:3.20 cm +--------------+-------++ Ao STJ diam:  2.4 cm  +--------------+-------++ Ao Asc diam:  3.00 cm +--------------+-------++  +-------------+---------++ MITRAL VALVE            +--------------+-------+ +-------------+---------++  SHUNTS                MV Peak grad:1.5 mmHg  +--------------+-------+ +-------------+---------++ Systemic VTI: 0.15 m  MV Mean grad:1.0 mmHg  +--------------+-------+ +-------------+---------++ Systemic Diam:2.40 cm MV Vmax:     0.61 m/s  +--------------+-------+ +-------------+---------++ MV Vmean:    31.9 cm/s +-------------+---------++ MV VTI:      0.17 m    +-------------+---------++   Val Eagle MD Electronically signed by Val Eagle MD Signature Date/Time: 07/27/2023/10:02:54 PM    Final    CT SCANS  CT CORONARY MORPH W/CTA COR W/SCORE 06/03/2023  Addendum 06/11/2023 12:18 PM ADDENDUM REPORT: 06/11/2023 12:16  EXAM: OVER-READ INTERPRETATION  CT CHEST  The following report is an over-read performed by radiologist Dr. Curly Shores [facility] on 06/11/2023. This over-read does not include interpretation of cardiac or coronary anatomy or pathology. The coronary CTA interpretation by the cardiologist is attached.  COMPARISON:  11/04/2018  FINDINGS: Cardiovascular:  Findings discussed in the body of the report.  Mediastinum/Nodes: No suspicious adenopathy identified. Imaged mediastinal structures are unremarkable.  Lungs/Pleura: There is dependent basilar subsegmental atelectasis. No pneumonia or pulmonary edema. No pleural effusion or pneumothorax.  Upper Abdomen: No acute abnormality.  Musculoskeletal: Superficial cystic lesion in the midline presternal region likely represents an epidermoid inclusion cyst. This is stable. There is minimal bilateral gynecomastia. No acute or significant osseous findings.  IMPRESSION: No significant extracardiac incidental findings identified.   Electronically Signed By: Layla Maw M.D. On: 06/11/2023 12:16  Narrative CLINICAL DATA:  62 Year-old Male  EXAM: Cardiac/Coronary  CTA  TECHNIQUE: The  patient was scanned on a [employer].  FINDINGS: Scan was triggered in the descending thoracic aorta. Axial non-contrast 3 mm slices were carried out through the heart. The data set was analyzed on a dedicated work station and scored using the Agatson method. Gantry rotation speed was 250 msecs and collimation was .6 mm. 0.8 mg of sl NTG was given. The 3D data set was reconstructed in 5% intervals of the 67-82 % of the R-R cycle. Diastolic phases were analyzed on a dedicated work station using MPR, MIP and VRT modes. The patient received 95 cc of contrast.  Coronary Arteries:  Normal coronary origin.  Right dominance.  Coronary Calcium Score:  Left main: 0  Left anterior descending artery: 364  Left circumflex artery: 58  Right coronary artery: 11  Total: 434  Percentile: 86th for age, sex, and race matched control.  RCA is a large dominant artery that gives rise to PDA and PLA. Minimal non-obstructive luminal narrowing (1-24%) in proximal RCA. There is a severe soft plaque stenosis (70-99%) in the mid RCA. Minimal distal calcified plaque.  Left main is a large artery that gives rise to LAD and LCX arteries. There is no significant plaque.  LAD is a large vessel that gives rise to one large D1 Branch. Minimal non-obstructive mixed plaques (1-24%) in proximal LAD, with mild non-obstructive mixed plaque (25-49%) in the proximal-mid LAD. There is a severe soft plaque stenosis in the mid LAD. Distal stair step artifact.  LCX is a non-dominant artery that gives rise to one large OM1 branch. Mild non-obstructive mixed plaque (25-49%) in the proximal LCX. There is a moderate soft plaque stenosis (50-70%) in the mid LCX. Distal stair step artifact.  Other findings:  There is a pedunculated well circumscribed 20 X 24 mm x 16 mm mass attached to the fossa ovalis of the left atrial septum with a small  stalk. This is concerning for atrial myxoma. Secondary thrombus  is not visualized in this study but cannot be excluded.  Aorta: Normal size.  No calcifications.  No dissection.  Main Pulmonary Artery: Normal size of the pulmonary artery.  Systemic Veins: Normal drainage  Aortic Valve:  Tri-leaflet.  No calcifications.  Mitral valve: Normal  Normal pulmonary vein drainage into the left atrium.  Normal left atrial appendage without a thrombus.  Left Ventricle: Normal size  Left Atrium: Normal size  Right Ventricle: Dilated size  Right Atrium: Dilated size  Pericardium: Normal thickness  Extra-cardiac findings: See attached radiology report for non-cardiac structures.  IMPRESSION: 1. Coronary calcium score of 434. This was 86th percentile for age, sex, and race matched control.  2. Findings are concerning for a left atrial myxoma. Difficult comparison to prior study; left atrial mass is much larger on this, dedicated, study.  3. CAD-RADS 4 Severe stenosis. (70-99% or > 50% left main). Cardiac catheterization or CT FFR is recommended. Consider symptom-guided anti-ischemic pharmacotherapy as well as risk factor modification per guideline directed care.  5. Attempted to contact patient and family X 5. Discussed with inpatient cardiology team; they will continue to reach out to patient.  RECOMMENDATIONS: RECOMMENDATIONS The proposed cut-off value of 1,651 AU yielded a 93 % sensitivity and 75 % specificity in grading AS severity in patients with classical low-flow, low-gradient AS. Proposed different cut-off values to define severe AS for men and women as 2,065 AU and 1,274 AU, respectively. The joint European and Harper recommendations for the assessment of AS consider the aortic valve calcium score as a continuum - a very high calcium score suggests severe AS and a low calcium score suggests severe AS is unlikely.  Sunday Shams, et al. 2017 ESC/EACTS Guidelines for the management of valvular heart disease.  Eur Heart J 2017;38:2739-91.  Coronary artery calcium (CAC) score is a strong predictor of incident coronary heart disease (CHD) and provides predictive information beyond traditional risk factors. CAC scoring is reasonable to use in the decision to withhold, postpone, or initiate statin therapy in intermediate-risk or selected borderline-risk asymptomatic adults (age 26-75 years and LDL-C >=70 to <190 mg/dL) who do not have diabetes or established atherosclerotic cardiovascular disease (ASCVD).* In intermediate-risk (10-year ASCVD risk >=7.5% to <20%) adults or selected borderline-risk (10-year ASCVD risk >=5% to <7.5%) adults in whom a CAC score is measured for the purpose of making a treatment decision the following recommendations have been made:  If CAC = 0, it is reasonable to withhold statin therapy and reassess in 5 to 10 years, as long as higher risk conditions are absent (diabetes mellitus, family history of premature CHD in first degree relatives (males <55 years; females <65 years), cigarette smoking, LDL >=190 mg/dL or other independent risk factors).  If CAC is 1 to 99, it is reasonable to initiate statin therapy for patients >=52 years of age.  If CAC is >=100 or >=75th percentile, it is reasonable to initiate statin therapy at any age.  Cardiology referral should be considered for patients with CAC scores =400 or >=75th percentile.  *2018 AHA/ACC/AACVPR/AAPA/ABC/ACPM/ADA/AGS/APhA/ASPC/NLA/PCNA Guideline on the Management of Blood Cholesterol: A Report of the Harper College of Cardiology/Har[place] Heart Association Task Force on Clinical Practice Guidelines. J Am Coll Cardiol. 2019;73(24):3168-3209.  Riley Lam, MD  Electronically Signed: By: Riley Lam M.D. On: 06/03/2023 18:15          Risk Assessment/Calculations:  Physical Exam:   VS:  BP 116/74 (BP Location: Right Arm, Patient Position: Sitting, Cuff Size: Normal)    Pulse 65   Ht 5\' 6"  (1.676 m)   Wt 168 lb 6.4 oz (76.4 kg)   SpO2 98%   BMI 27.18 kg/m    Wt Readings from Last 3 Encounters:  10/11/23 168 lb 6.4 oz (76.4 kg)  10/06/23 168 lb (76.2 kg)  09/13/23 164 lb 6.4 oz (74.6 kg)    GEN: Well nourished, well developed in no acute distress. Sitting comfortably on the exam table  NECK: No JVD; No carotid bruits CARDIAC: RRR, no murmurs, rubs, gallops. Radial pulses 2+ bilaterally  RESPIRATORY:  Clear to auscultation without rales, wheezing or rhonchi. Normal work of breathing on room air   ABDOMEN: Soft, non-tender, non-distended EXTREMITIES:  No edema in BLE; No deformity   ASSESSMENT AND PLAN: .    CAD S/p CABG  - Patient underwent CABG x3 with LIMA-LAD, reverse SVG-PDA and OM in 06/2023  - Echocardiogram from 05/2023 prior to CABG showed EF 60-65% without regional wall motion abnormalities  - Patient denies chest pain, SOB on exertion. Rarely has a "twinge" of chest pain near the bottom of his sternum, but no pain otherwise  - Continue ASA, plavix - denies bleeding. Hemoglobin 14.2 on 9/17  - Continue lipitor 80 mg daily  - Continue metoprolol tartrate 50 mg BID  - He is now 3 months post CABG. Has been participating in cardiac rehab without chest pain or SOB. Patient cleared to go back to work on Monday 10/21. Work note provided   Post-operative atrial flutter - Resolved  - Patient underwent CABG on 07/13/23. At follow up appointment on 8/7, he was found to be in atrial flutter with HR in the 130s. At his appointment on 8/13, he was in sinus tachycardia. Has been maintaining NSR since. Eliquis has been stopped.  - No palpitations, tachycardia at home. Wears apple watch and denies alerts for afib, tachycardia  - Continue metoprolol tartrate 50 mg BID    Left Atrial Myxoma s/p resection  - Patient underwent resection of LA myxoma at the time of CABG on 7/17   HLD  - Recent lipid panel from 07/29/23 showed LDL 72, HDL 36, triglycerides 161,  total cholesterol 141 - Continue lipitor 80 mg daily    HTN  - BP well controlled on current mediations  - Continue metoprolol tartrate 50 mg BID    Dispo: Follow up in 4 months   Signed, Jonita Albee, PA-C

## 2023-09-30 ENCOUNTER — Encounter: Payer: 59 | Admitting: *Deleted

## 2023-09-30 DIAGNOSIS — Z951 Presence of aortocoronary bypass graft: Secondary | ICD-10-CM

## 2023-09-30 DIAGNOSIS — M25511 Pain in right shoulder: Secondary | ICD-10-CM | POA: Diagnosis not present

## 2023-09-30 NOTE — Progress Notes (Signed)
Daily Session Note  Patient Details  Name: Thomas Harper MRN: [id number] Date of Birth: [birth year] Referring Provider:   Flowsheet Row Cardiac Rehab from 09/06/2023 in [facility] Cardiac and Pulmonary Rehab  Referring Provider Dr. Marikay Alar       Encounter Date: 09/30/2023  Check In:  Session Check In - 09/30/23 1136       Check-In   Supervising physician immediately available to respond to emergencies See telemetry face sheet for immediately available ER MD    Location [facility]-Cardiac & Pulmonary Rehab    Staff Present Elige Ko, RCP,RRT,BSRT;Cora Collum, RN, BSN, CCRP;Noah Tickle, BS, Exercise Physiologist    Virtual Visit No    Medication changes reported     No    Fall or balance concerns reported    No    Warm-up and Cool-down Performed on first and last piece of equipment    Resistance Training Performed Yes    VAD Patient? No    PAD/SET Patient? No      Pain Assessment   Currently in Pain? No/denies                Social History   Tobacco Use  Smoking Status Former   Current packs/day: 0.00   Types: Cigarettes   Quit date: 05/21/2021   Years since quitting: 2.3  Smokeless Tobacco Former    Goals Met:  Independence with exercise equipment Exercise tolerated well No report of concerns or symptoms today  Goals Unmet:  Not Applicable  Comments: Pt able to follow exercise prescription today without complaint.  Will continue to monitor for progression.    Dr. Bethann Punches is Medical Director for [facility] Cardiac Rehabilitation.  Dr. Vida Rigger is Medical Director for [facility] Pulmonary Rehabilitation.

## 2023-10-03 ENCOUNTER — Encounter: Payer: 59 | Admitting: *Deleted

## 2023-10-03 ENCOUNTER — Telehealth: Payer: Self-pay

## 2023-10-03 DIAGNOSIS — Z951 Presence of aortocoronary bypass graft: Secondary | ICD-10-CM

## 2023-10-03 DIAGNOSIS — M25511 Pain in right shoulder: Secondary | ICD-10-CM | POA: Diagnosis not present

## 2023-10-03 NOTE — Telephone Encounter (Signed)
Patient's wife, Obed Samek, called to state patient has possible pulled muscle in right arm - pain when he moves his arm in certain ways - cardiac rehab cannot address.  I scheduled an appointment for patient to see Dr. Dale Maybell on 10/11/2023, and let Lynden Ang know that I will send a message to Dr. Lorin Picket so she is aware.

## 2023-10-03 NOTE — Progress Notes (Signed)
Daily Session Note  Patient Details  Name: Thomas Harper MRN: [id number] Date of Birth: Apr 29, 1961 Referring Provider:   Flowsheet Row Cardiac Rehab from 09/06/2023 in [facility] Cardiac and Pulmonary Rehab  Referring Provider Dr. Marikay Alar       Encounter Date: 10/03/2023  Check In:  Session Check In - 10/03/23 1122       Check-In   Supervising physician immediately available to respond to emergencies See telemetry face sheet for immediately available ER MD    Location [facility]-Cardiac & Pulmonary Rehab    Staff Present Cora Collum, RN, BSN, CCRP;Margaret Best, MS, Exercise Physiologist;Maxon Conetta BS, , Exercise Physiologist;Kelly Madilyn Fireman, BS, ACSM CEP, Exercise Physiologist;Walterine Amodei Katrinka Blazing, RN, ADN    Virtual Visit No    Medication changes reported     No    Fall or balance concerns reported    No    Warm-up and Cool-down Performed on first and last piece of equipment    Resistance Training Performed Yes    VAD Patient? No    PAD/SET Patient? No      Pain Assessment   Currently in Pain? No/denies                Social History   Tobacco Use  Smoking Status Former   Current packs/day: 0.00   Types: Cigarettes   Quit date: 05/21/2021   Years since quitting: 2.3  Smokeless Tobacco Former    Goals Met:  Independence with exercise equipment Exercise tolerated well No report of concerns or symptoms today Strength training completed today  Goals Unmet:  Not Applicable  Comments: Pt able to follow exercise prescription today without complaint.  Will continue to monitor for progression.    Dr. Bethann Punches is Medical Director for [facility] Cardiac Rehabilitation.  Dr. Vida Rigger is Medical Director for [facility] Pulmonary Rehabilitation.

## 2023-10-03 NOTE — Telephone Encounter (Signed)
Spoke with patient & he states that when he was seen at cardiac rehab they feel that this is related to how the had his arm positioned during surgery. Pt denies any chest pain or trouble with breathing. Just with ROM.   I have moved his appt to Thurs 10/06/23 @ 11am. Pt aware

## 2023-10-05 ENCOUNTER — Encounter: Payer: 59 | Admitting: *Deleted

## 2023-10-05 DIAGNOSIS — Z951 Presence of aortocoronary bypass graft: Secondary | ICD-10-CM

## 2023-10-05 DIAGNOSIS — M25511 Pain in right shoulder: Secondary | ICD-10-CM | POA: Diagnosis not present

## 2023-10-05 NOTE — Progress Notes (Signed)
Daily Session Note  Patient Details  Name: Thomas Harper MRN: [id number] Date of Birth: May 05, 1961 Referring Provider:   Flowsheet Row Cardiac Rehab from 09/06/2023 in [facility] Cardiac and Pulmonary Rehab  Referring Provider Dr. Marikay Alar       Encounter Date: 10/05/2023  Check In:  Session Check In - 10/05/23 1130       Check-In   Supervising physician immediately available to respond to emergencies See telemetry face sheet for immediately available ER MD    Location [facility]-Cardiac & Pulmonary Rehab    Staff Present Rory Percy, MS, Exercise Physiologist;Meredith Jewel Baize, RN BSN;Joseph Reino Kent, Guinevere Ferrari, RN, ADN    Virtual Visit No    Medication changes reported     No    Fall or balance concerns reported    No    Warm-up and Cool-down Performed on first and last piece of equipment    Resistance Training Performed Yes    VAD Patient? No    PAD/SET Patient? No      Pain Assessment   Currently in Pain? No/denies                Social History   Tobacco Use  Smoking Status Former   Current packs/day: 0.00   Types: Cigarettes   Quit date: 05/21/2021   Years since quitting: 2.3  Smokeless Tobacco Former    Goals Met:  Independence with exercise equipment Exercise tolerated well No report of concerns or symptoms today Strength training completed today  Goals Unmet:  Not Applicable  Comments: Pt able to follow exercise prescription today without complaint.  Will continue to monitor for progression.    Dr. Bethann Punches is Medical Director for [facility] Cardiac Rehabilitation.  Dr. Vida Rigger is Medical Director for [facility] Pulmonary Rehabilitation.

## 2023-10-06 ENCOUNTER — Ambulatory Visit (INDEPENDENT_AMBULATORY_CARE_PROVIDER_SITE_OTHER): Payer: 59 | Admitting: Internal Medicine

## 2023-10-06 ENCOUNTER — Encounter: Payer: Self-pay | Admitting: Internal Medicine

## 2023-10-06 VITALS — BP 124/72 | HR 62 | Temp 98.2°F | Resp 16 | Ht 66.0 in | Wt 168.0 lb

## 2023-10-06 DIAGNOSIS — K219 Gastro-esophageal reflux disease without esophagitis: Secondary | ICD-10-CM

## 2023-10-06 DIAGNOSIS — R739 Hyperglycemia, unspecified: Secondary | ICD-10-CM

## 2023-10-06 DIAGNOSIS — E7800 Pure hypercholesterolemia, unspecified: Secondary | ICD-10-CM

## 2023-10-06 DIAGNOSIS — M25511 Pain in right shoulder: Secondary | ICD-10-CM | POA: Diagnosis not present

## 2023-10-06 DIAGNOSIS — Z951 Presence of aortocoronary bypass graft: Secondary | ICD-10-CM

## 2023-10-06 DIAGNOSIS — I25119 Atherosclerotic heart disease of native coronary artery with unspecified angina pectoris: Secondary | ICD-10-CM

## 2023-10-06 DIAGNOSIS — I10 Essential (primary) hypertension: Secondary | ICD-10-CM
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
## 2023-10-07 ENCOUNTER — Encounter: Payer: 59 | Admitting: *Deleted

## 2023-10-07 DIAGNOSIS — Z951 Presence of aortocoronary bypass graft: Secondary | ICD-10-CM

## 2023-10-07 DIAGNOSIS — M25511 Pain in right shoulder: Secondary | ICD-10-CM | POA: Diagnosis not present

## 2023-10-07 NOTE — Progress Notes (Signed)
Daily Session Note  Patient Details  Name: Thomas Harper MRN: [id number] Date of Birth: 03-11-1961 Referring Provider:   Flowsheet Row Cardiac Rehab from 09/06/2023 in [facility] Cardiac and Pulmonary Rehab  Referring Provider Dr. Marikay Alar       Encounter Date: 10/07/2023  Check In:  Session Check In - 10/07/23 1152       Check-In   Supervising physician immediately available to respond to emergencies See telemetry face sheet for immediately available ER MD    Location [facility]-Cardiac & Pulmonary Rehab    Staff Present Bess Kinds RN, BSN;Margaret Best, MS, Exercise Physiologist;Noah Tickle, BS, Exercise Physiologist;Maxon Conetta BS, , Exercise Physiologist    Virtual Visit No    Medication changes reported     No    Fall or balance concerns reported    No    Warm-up and Cool-down Performed on first and last piece of equipment    Resistance Training Performed Yes    VAD Patient? No    PAD/SET Patient? No      Pain Assessment   Currently in Pain? No/denies                Social History   Tobacco Use  Smoking Status Former   Current packs/day: 0.00   Types: Cigarettes   Quit date: 05/21/2021   Years since quitting: 2.3  Smokeless Tobacco Former    Goals Met:  Independence with exercise equipment Exercise tolerated well No report of concerns or symptoms today Strength training completed today  Goals Unmet:  Not Applicable  Comments: Pt able to follow exercise prescription today without complaint.  Will continue to monitor for progression.    Dr. Bethann Punches is Medical Director for [facility] Cardiac Rehabilitation.  Dr. Vida Rigger is Medical Director for [facility] Pulmonary Rehabilitation.

## 2023-10-09 NOTE — Assessment & Plan Note (Signed)
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 Continue risk factor modification. Continue plavix, metoprolol and lipitor. Continue f/u with cardiology. Continue cardiac rehab.

## 2023-10-09 NOTE — Assessment & Plan Note (Signed)
Right upper arm pain as outlined. Limited rom.  Refer to PT for further evaluation and treatment.

## 2023-10-09 NOTE — Assessment & Plan Note (Signed)
Low carb diet and exercise.  Follow met b and a1c.   

## 2023-10-09 NOTE — Assessment & Plan Note (Signed)
Blood pressure as outlined.  Continue metoprolol.  Follow pressures.  Follow metabolic panel.

## 2023-10-09 NOTE — Assessment & Plan Note (Signed)
Protonix/pepcid.  Follow.

## 2023-10-09 NOTE — Assessment & Plan Note (Signed)
On high dose lipitor. Did not tolerate repatha.  Continue lipitor. Low cholesterol diet and exercise.  Follow lipid panel and liver function tests.

## 2023-10-09 NOTE — Assessment & Plan Note (Signed)
Incision healed well. Doing well with cardiac rehab.  Continue f/u with cardiology.  Energy improving.

## 2023-10-10 ENCOUNTER — Encounter: Payer: 59 | Admitting: *Deleted

## 2023-10-10 ENCOUNTER — Ambulatory Visit: Payer: 59 | Attending: Internal Medicine

## 2023-10-10 DIAGNOSIS — M25511 Pain in right shoulder: Secondary | ICD-10-CM | POA: Diagnosis not present

## 2023-10-10 DIAGNOSIS — M25619 Stiffness of unspecified shoulder, not elsewhere classified: Secondary | ICD-10-CM | POA: Insufficient documentation

## 2023-10-10 DIAGNOSIS — M6281 Muscle weakness (generalized): Secondary | ICD-10-CM | POA: Insufficient documentation

## 2023-10-10 DIAGNOSIS — Z951 Presence of aortocoronary bypass graft: Secondary | ICD-10-CM

## 2023-10-10 NOTE — Progress Notes (Signed)
Daily Session Note  Patient Details  Name: Thomas Harper MRN: [id number] Date of Birth: 12-23-1961 Referring Provider:   Flowsheet Row Cardiac Rehab from 09/06/2023 in [facility] Cardiac and Pulmonary Rehab  Referring Provider Dr. Marikay Alar       Encounter Date: 10/10/2023  Check In:  Session Check In - 10/10/23 1050       Check-In   Supervising physician immediately available to respond to emergencies See telemetry face sheet for immediately available ER MD    Location [facility]-Cardiac & Pulmonary Rehab    Staff Present Rory Percy, MS, Exercise Physiologist;Maxon Suzzette Righter, , Exercise Physiologist;Kelly Madilyn Fireman, BS, ACSM CEP, Exercise Physiologist;Glorious Flicker Katrinka Blazing, RN, ADN    Virtual Visit No    Medication changes reported     No    Fall or balance concerns reported    No    Warm-up and Cool-down Performed on first and last piece of equipment    Resistance Training Performed Yes    VAD Patient? No    PAD/SET Patient? No      Pain Assessment   Currently in Pain? No/denies                Social History   Tobacco Use  Smoking Status Former   Current packs/day: 0.00   Types: Cigarettes   Quit date: 05/21/2021   Years since quitting: 2.3  Smokeless Tobacco Former    Goals Met:  Independence with exercise equipment Exercise tolerated well No report of concerns or symptoms today Strength training completed today  Goals Unmet:  Not Applicable  Comments: Pt able to follow exercise prescription today without complaint.  Will continue to monitor for progression.    Dr. Bethann Punches is Medical Director for [facility] Cardiac Rehabilitation.  Dr. Vida Rigger is Medical Director for [facility] Pulmonary Rehabilitation.
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
## 2023-10-11 ENCOUNTER — Ambulatory Visit: Payer: 59 | Admitting: Internal Medicine

## 2023-10-11 ENCOUNTER — Ambulatory Visit: Payer: 59 | Attending: Cardiology | Admitting: Cardiology

## 2023-10-11 ENCOUNTER — Encounter: Payer: Self-pay | Admitting: Cardiology

## 2023-10-11 VITALS — BP 116/74 | HR 65 | Ht 66.0 in | Wt 168.4 lb

## 2023-10-11 DIAGNOSIS — D151 Benign neoplasm of heart: Secondary | ICD-10-CM

## 2023-10-11 DIAGNOSIS — Z951 Presence of aortocoronary bypass graft: Secondary | ICD-10-CM | POA: Diagnosis not present

## 2023-10-11 DIAGNOSIS — E785 Hyperlipidemia, unspecified: Secondary | ICD-10-CM | POA: Diagnosis not present

## 2023-10-11 DIAGNOSIS — I2510 Atherosclerotic heart disease of native coronary artery without angina pectoris: Secondary | ICD-10-CM

## 2023-10-11 DIAGNOSIS — I10 Essential (primary) hypertension: Secondary | ICD-10-CM

## 2023-10-11 NOTE — Patient Instructions (Signed)
Medication Instructions:  No changes *If you need a refill on your cardiac medications before your next appointment, please call your pharmacy*   Lab Work: No Labs   Testing/Procedures: No Testing   Follow-Up: At [facility], you and your health needs are our priority.  As part of our continuing mission to provide you with exceptional heart care, we have created designated Provider Care Teams.  These Care Teams include your primary Cardiologist (physician) and Advanced Practice Providers (APPs -  Physician Assistants and Nurse Practitioners) who all work together to provide you with the care you need, when you need it.  We recommend signing up for the patient portal called "MyChart".  Sign up information is provided on this After Visit Summary.  MyChart is used to connect with patients for Virtual Visits (Telemedicine).  Patients are able to view lab/test results, encounter notes, upcoming appointments, etc.  Non-urgent messages can be sent to your provider as well.   To learn more about what you can do with MyChart, go to [email or web address].    Your next appointment:   4 month(s)  Provider:   Little Ishikawa, MD

## 2023-10-12 ENCOUNTER — Ambulatory Visit: Payer: 59 | Admitting: Physical Therapy

## 2023-10-12 ENCOUNTER — Encounter: Payer: 59 | Admitting: *Deleted

## 2023-10-12 DIAGNOSIS — M6281 Muscle weakness (generalized): Secondary | ICD-10-CM

## 2023-10-12 DIAGNOSIS — M25619 Stiffness of unspecified shoulder, not elsewhere classified: Secondary | ICD-10-CM

## 2023-10-12 DIAGNOSIS — Z951 Presence of aortocoronary bypass graft: Secondary | ICD-10-CM

## 2023-10-12 DIAGNOSIS — M25511 Pain in right shoulder: Secondary | ICD-10-CM | POA: Diagnosis not present

## 2023-10-12 NOTE — Progress Notes (Signed)
Daily Session Note  Patient Details  Name: Thomas Harper MRN: [id number] Date of Birth: 12/04/1961 Referring Provider:   Flowsheet Row Cardiac Rehab from 09/06/2023 in [facility]  Referring Provider Dr. Marikay Alar       Encounter Date: 10/12/2023  Check In:  Session Check In - 10/12/23 1105       Check-In   Supervising physician immediately available to respond to emergencies See telemetry face sheet for immediately available ER MD    Location [facility]    Staff Present Cora Collum, RN, BSN, CCRP;Joseph Hood, RCP,RRT,BSRT;Maxon Heidelberg BS, , Exercise Physiologist;Cully Luckow Katrinka Blazing, RN, [place]    Virtual Visit No    Medication changes reported     No    Fall or balance concerns reported    No    Warm-up and Cool-down Performed on first and last piece of equipment    Resistance Training Performed Yes    VAD Patient? No    PAD/SET Patient? No      Pain Assessment   Currently in Pain? No/denies                Social History   Tobacco Use  Smoking Status Former   Current packs/day: 0.00   Types: Cigarettes   Quit date: 05/21/2021   Years since quitting: 2.3  Smokeless Tobacco Former    Goals Met:  Independence with exercise equipment Exercise tolerated well No report of concerns or symptoms today Strength training completed today  Goals Unmet:  Not Applicable  Comments: Pt able to follow exercise prescription today without complaint.  Will continue to monitor for progression.    Dr. Bethann Punches is Medical Director for [facility].  Dr. Vida Rigger is Medical Director for [facility].
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
## 2023-10-13 ENCOUNTER — Encounter: Payer: 59 | Admitting: Physical Therapy

## 2023-10-14 ENCOUNTER — Encounter: Payer: 59 | Admitting: *Deleted

## 2023-10-14 DIAGNOSIS — Z951 Presence of aortocoronary bypass graft: Secondary | ICD-10-CM

## 2023-10-14 DIAGNOSIS — M25511 Pain in right shoulder: Secondary | ICD-10-CM | POA: Diagnosis not present

## 2023-10-14 NOTE — Progress Notes (Signed)
Daily Session Note  Patient Details  Name: Thomas Harper MRN: [id number] Date of Birth: 1961-01-04 Referring Provider:   Flowsheet Row Cardiac Rehab from 09/06/2023 in [facility]  Referring Provider Dr. Marikay Alar       Encounter Date: 10/14/2023  Check In:  Session Check In - 10/14/23 1135       Check-In   Supervising physician immediately available to respond to emergencies See telemetry face sheet for immediately available ER MD    Location [facility]-Cardiac & Pulmonary Rehab    Staff Present Elige Ko, RCP,RRT,BSRT;Noah Tickle, BS, Exercise Physiologist;Firman Petrow, RN, BSN, CCRP    Virtual Visit No    Medication changes reported     No    Fall or balance concerns reported    No    Warm-up and Cool-down Performed on first and last piece of equipment    Resistance Training Performed Yes    VAD Patient? No    PAD/SET Patient? No      Pain Assessment   Currently in Pain? No/denies                Social History   Tobacco Use  Smoking Status Former   Current packs/day: 0.00   Types: Cigarettes   Quit date: 05/21/2021   Years since quitting: 2.4  Smokeless Tobacco Former    Goals Met:  Independence with exercise equipment Exercise tolerated well No report of concerns or symptoms today  Goals Unmet:  Not Applicable  Comments: Pt able to follow exercise prescription today without complaint.  Will continue to monitor for progression.    Dr. Bethann Punches is Medical Director for [facility] Cardiac Rehabilitation.  Dr. Vida Rigger is Medical Director for [facility] Pulmonary Rehabilitation.

## 2023-10-19 ENCOUNTER — Encounter: Payer: 59 | Admitting: Physical Therapy

## 2023-10-19 DIAGNOSIS — G4733 Obstructive sleep apnea (adult) (pediatric): Secondary | ICD-10-CM | POA: Diagnosis not present

## 2023-10-19 NOTE — Progress Notes (Signed)
Recommend auto-titrating CPAP 5-15 cm H2O. Please let patient know and please send DME order if patient agrees to CPAP therapy. Thanks!

## 2023-10-21 ENCOUNTER — Encounter: Payer: 59 | Admitting: *Deleted

## 2023-10-21 ENCOUNTER — Ambulatory Visit: Payer: 59 | Admitting: Physical Therapy

## 2023-10-21 DIAGNOSIS — M25511 Pain in right shoulder: Secondary | ICD-10-CM

## 2023-10-21 DIAGNOSIS — M25619 Stiffness of unspecified shoulder, not elsewhere classified: Secondary | ICD-10-CM

## 2023-10-21 DIAGNOSIS — Z951 Presence of aortocoronary bypass graft: Secondary | ICD-10-CM

## 2023-10-21 DIAGNOSIS — M6281 Muscle weakness (generalized): Secondary | ICD-10-CM

## 2023-10-21 NOTE — Progress Notes (Signed)
Daily Session Note  Patient Details  Name: Thomas Harper MRN: [id number] Date of Birth: 1961/08/07 Referring Provider:   Flowsheet Row Cardiac Rehab from 09/06/2023 in [facility] Cardiac and Pulmonary Rehab  Referring Provider Dr. Marikay Alar       Encounter Date: 10/21/2023  Check In:  Session Check In - 10/21/23 1122       Check-In   Supervising physician immediately available to respond to emergencies See telemetry face sheet for immediately available ER MD    Location [facility]-Cardiac & Pulmonary Re[facility]    Staff Present Elige Ko, RCP,RRT,BSRT;Noah Tickle, BS, Exercise Physiologist;Kianna Billet, RN, BSN, CCRP    Virtual Visit No    Medication changes reported     No    Fall or balance concerns reported    No    Warm-up and Cool-down Performed on first and last piece of equipment    Resistance Training Performed Yes    VAD Patient? No    PAD/SET Patient? No      Pain Assessment   Currently in Pain? No/denies                Social History   Tobacco Use  Smoking Status Former   Current packs/day: 0.00   Types: Cigarettes   Quit date: 05/21/2021   Years since quitting: 2.4  Smokeless Tobacco Former    Goals Met:  Independence with exercise equipment Exercise tolerated well No report of concerns or symptoms today  Goals Unmet:  Not Applicable  Comments: Pt able to follow exercise prescription today without complaint.  Will continue to monitor for progression.    Dr. Bethann Punches is Medical Director for [facility] Cardiac Rehabilitation.  Dr. Vida Rigger is Medical Director for [facility] Pulmonary Rehabilitation.
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
## 2023-10-26 ENCOUNTER — Encounter: Payer: 59 | Admitting: Physical Therapy

## 2023-10-26 ENCOUNTER — Encounter: Payer: Self-pay | Admitting: *Deleted

## 2023-10-26 DIAGNOSIS — Z951 Presence of aortocoronary bypass graft: Secondary | ICD-10-CM

## 2023-10-26 NOTE — Progress Notes (Signed)
Cardiac Individual Treatment Plan  Patient Details  Name: Thomas Harper MRN: [id number] Date of Birth: 1961-03-16 Referring Provider:   Flowsheet Row Cardiac Rehab from 09/06/2023 in [facility] Cardiac and Pulmonary Rehab  Referring Provider Dr. Marikay Alar       Initial Encounter Date:  Flowsheet Row Cardiac Rehab from 09/06/2023 in [facility] Cardiac and Pulmonary Rehab  Date 09/06/23       Visit Diagnosis: S/P CABG x 3  Patient's Home Medications on Admission:  Current Outpatient Medications:    acetaminophen (TYLENOL) 500 MG tablet, Take 1,000 mg by mouth every 8 (eight) hours as needed for headache., Disp: , Rfl:    aspirin EC 81 MG tablet, Take 1 tablet (81 mg total) by mouth daily. Swallow whole., Disp: 90 tablet, Rfl: 3   atorvastatin (LIPITOR) 80 MG tablet, Take 1 tablet (80 mg total) by mouth daily., Disp: 30 tablet, Rfl: 11   calcium carbonate (TUMS - DOSED IN MG ELEMENTAL CALCIUM) 500 MG chewable tablet, Chew 1 tablet by mouth continuous as needed for indigestion or heartburn., Disp: , Rfl:    clopidogrel (PLAVIX) 75 MG tablet, Take 1 tablet (75 mg total) by mouth daily., Disp: 90 tablet, Rfl: 3   lansoprazole (PREVACID) 15 MG capsule, Take 15 mg by mouth daily in the afternoon., Disp: , Rfl:    metoprolol tartrate (LOPRESSOR) 50 MG tablet, Take 1 tablet (50 mg total) by mouth 2 (two) times daily., Disp: 180 tablet, Rfl: 3   pantoprazole (PROTONIX) 40 MG tablet, TAKE ONE TABLET (40 MG TOTAL) BY MOUTH DAILY. TAKE 30 MINUTES BEFORE YOUR EVENING MEAL, Disp: 90 tablet, Rfl: 3   traMADol (ULTRAM) 50 MG tablet, Take 1 tablet (50 mg total) by mouth every 6 (six) hours as needed., Disp: 15 tablet, Rfl: 0  Past Medical History: Past Medical History:  Diagnosis Date   Anginal pain (HCC)    Chronic leg pain    s/p injury right leg (age 7)   Coronary artery disease    GERD (gastroesophageal reflux disease)    History of ankle fracture    persistent pain   History of kidney stones     Hypercholesterolemia    Hypertension    Nephrolithiasis    followed by Assunta Gambles   Vitamin D deficiency     Tobacco Use: Social History   Tobacco Use  Smoking Status Former   Current packs/day: 0.00   Types: Cigarettes   Quit date: 05/21/2021   Years since quitting: 2.4  Smokeless Tobacco Former    Labs: Review Flowsheet  More data exists      Latest Ref Rng & Units 11/12/2022 03/18/2023 07/11/2023 07/13/2023 07/29/2023  Labs for ITP Cardiac and Pulmonary Rehab  Cholestrol 0 - 200 mg/dL 409  811  - - 914   LDL (calc) 0 - 99 mg/dL 782  956  - - 72   HDL-C >39.00 mg/dL 21.30  86.57  - - 84.69   Trlycerides 0.0 - 149.0 mg/dL 62.9  528.4  - - 132.4   Hemoglobin A1c 4.8 - 5.6 % - 6.2  5.9  - -  PH, Arterial 7.35 - 7.45 - - 7.42  7.355  7.358  7.338  7.444  7.362  -  PCO2 arterial 32 - 48 mmHg - - 38  37.6  40.9  45.1  32.1  44.0  -  Bicarbonate 20.0 - 28.0 mmol/L - - 24.6  21.1  23.2  24.2  22.0  25.0  -  TCO2  22 - 32 mmol/L - - - 22  25  24  26  26  27  24  23  24  27  26   -  Acid-base deficit 0.0 - 2.0 mmol/L - - - 4.0  2.0  2.0  2.0  1.0  -  O2 Saturation % - - 99  99  99  99  100  100  -    Details       Multiple values from one day are sorted in reverse-chronological order          Exercise Target Goals: Exercise Program Goal: Individual exercise prescription set using results from initial 6 min walk test and THRR while considering  patient's activity barriers and safety.   Exercise Prescription Goal: Initial exercise prescription builds to 30-45 minutes a day of aerobic activity, 2-3 days per week.  Home exercise guidelines will be given to patient during program as part of exercise prescription that the participant will acknowledge.   Education: Aerobic Exercise: - Group verbal and visual presentation on the components of exercise prescription. Introduces F.I.T.T principle from ACSM for exercise prescriptions.  Reviews F.I.T.T. principles of aerobic exercise  including progression. Written material given at graduation.   Education: Resistance Exercise: - Group verbal and visual presentation on the components of exercise prescription. Introduces F.I.T.T principle from ACSM for exercise prescriptions  Reviews F.I.T.T. principles of resistance exercise including progression. Written material given at graduation.    Education: Exercise & Equipment Safety: - Individual verbal instruction and demonstration of equipment use and safety with use of the equipment. Flowsheet Row Cardiac Rehab from 10/12/2023 in [facility] Cardiac and Pulmonary Rehab  Date 09/06/23  Educator mc  Instruction Review Code 1- Verbalizes Understanding       Education: Exercise Physiology & General Exercise Guidelines: - Group verbal and written instruction with models to review the exercise physiology of the cardiovascular system and associated critical values. Provides general exercise guidelines with specific guidelines to those with heart or lung disease.  Flowsheet Row Cardiac Rehab from 10/12/2023 in [facility] Cardiac and Pulmonary Rehab  Date 10/12/23  Educator NT  Instruction Review Code 1- [employer] Understanding       Education: Flexibility, Balance, Mind/Body Relaxation: - Group verbal and visual presentation with interactive activity on the components of exercise prescription. Introduces F.I.T.T principle from ACSM for exercise prescriptions. Reviews F.I.T.T. principles of flexibility and balance exercise training including progression. Also discusses the mind body connection.  Reviews various relaxation techniques to help reduce and manage stress (i.e. Deep breathing, progressive muscle relaxation, and visualization). Balance handout provided to take home. Written material given at graduation.   Activity Barriers & Risk Stratification:  Activity Barriers & Cardiac Risk Stratification - 09/06/23 1547       Activity Barriers & Cardiac Risk Stratification   Activity  Barriers None    Cardiac Risk Stratification High             6 Minute Walk:  6 Minute Walk     Row Name 09/06/23 1545         6 Minute Walk   Phase Initial     Distance 1200 feet     Walk Time 6 minutes     # of Rest Breaks 0     MPH 2.3     METS 3.1     RPE 11     Perceived Dyspnea  0     VO2 Peak 10.7     Symptoms No  Resting HR 72 bpm     Resting BP 106/64     Resting Oxygen Saturation  96 %     Exercise Oxygen Saturation  during 6 min walk 98 %     Max Ex. HR 107 bpm     Max Ex. BP 114/70     2 Minute Post BP 110/68              Oxygen Initial Assessment:   Oxygen Re-Evaluation:   Oxygen Discharge (Final Oxygen Re-Evaluation):   Initial Exercise Prescription:  Initial Exercise Prescription - 09/06/23 1500       Date of Initial Exercise RX and Referring Provider   Date 09/06/23    Referring Provider Dr. Marikay Alar      Oxygen   Maintain Oxygen Saturation 88% or higher      Treadmill   MPH 2.3    Grade 0    Minutes 15    METs 2.76      NuStep   Level 2    Minutes 15    METs 3.1      REL-XR   Level 2    Minutes 15    METs 3.1      T5 Nustep   Level 2    Minutes 15    METs 3.1      Biostep-RELP   Level 2    Minutes 15    METs 3.1      Prescription Details   Duration Progress to 30 minutes of continuous aerobic without signs/symptoms of physical distress      Intensity   THRR 40-80% of Max Heartrate 106-140    Ratings of Perceived Exertion 11-13    Perceived Dyspnea 0-4      Progression   Progression Continue to progress workloads to maintain intensity without signs/symptoms of physical distress.      Resistance Training   Training Prescription Yes    Weight 5lb    Reps 10-15             Perform Capillary Blood Glucose checks as needed.  Exercise Prescription Changes:   Exercise Prescription Changes     Row Name 09/06/23 1500 09/20/23 1400 10/07/23 1100 10/12/23 1100 10/19/23 1000     Response  to Exercise   Blood Pressure (Admit) 106/64 102/58 128/72 -- 112/64   Blood Pressure (Exercise) 114/70 142/80 132/62 -- 178/82   Blood Pressure (Exit) 110/68 96/64 110/66 -- 104/60   Heart Rate (Admit) 72 bpm 86 bpm 74 bpm -- 69 bpm   Heart Rate (Exercise) 107 bpm 103 bpm 96 bpm -- 97 bpm   Heart Rate (Exit) 73 bpm 77 bpm 74 bpm -- 72 bpm   Oxygen Saturation (Admit) 96 % -- -- -- --   Oxygen Saturation (Exercise) 98 % -- -- -- --   Oxygen Saturation (Exit) 98 % -- -- -- --   Rating of Perceived Exertion (Exercise) 11 14 14  -- 13   Perceived Dyspnea (Exercise) 0 -- -- -- --   Symptoms none -- none -- none   Comments Orientation -- -- -- --   Duration Progress to 30 minutes of  aerobic without signs/symptoms of physical distress Continue with 30 min of aerobic exercise without signs/symptoms of physical distress. Continue with 30 min of aerobic exercise without signs/symptoms of physical distress. -- Continue with 30 min of aerobic exercise without signs/symptoms of physical distress.   Intensity THRR unchanged THRR unchanged THRR unchanged -- THRR unchanged  Progression   Progression Continue to progress workloads to maintain intensity without signs/symptoms of physical distress. Continue to progress workloads to maintain intensity without signs/symptoms of physical distress. Continue to progress workloads to maintain intensity without signs/symptoms of physical distress. -- Continue to progress workloads to maintain intensity without signs/symptoms of physical distress.   Average METs 3.1 3.03 3.32 -- 3.47     Resistance Training   Training Prescription -- Yes Yes -- Yes   Weight -- 5lb 5lb -- 5 lb   Reps -- 10-15 10-15 -- 10-15     Interval Training   Interval Training -- -- No -- No     Treadmill   MPH -- 2.6 2.6 -- 2.7   Grade -- 0 2 -- 1.5   Minutes -- 15 15 -- 15   METs -- 2.99 3.71 -- 2.63     NuStep   Level -- 4 -- -- 4   Minutes -- 15 -- -- 15   METs -- 4.3 -- --  4.7     REL-XR   Level -- 3 4 -- 3   Minutes -- 15 15 -- 15   METs -- 3.1 3.7 -- --     T5 Nustep   Level -- 3 3 -- 3   Minutes -- 15 15 -- 15   METs -- 2.3 2.1 -- 2.5     Biostep-RELP   Level -- 2 3 -- 2   Minutes -- 15 15 -- 15   METs -- 3 -- -- 4     Home Exercise Plan   Plans to continue exercise at -- -- -- Home (comment)  stationary bike, walking at home, looking into getting a gym membership Home (comment)  stationary bike, walking at home, looking into getting a gym membership   Frequency -- -- -- Add 4 additional days to program exercise sessions. Add 4 additional days to program exercise sessions.   Initial Home Exercises Provided -- -- -- 10/12/23 10/12/23     Oxygen   Maintain Oxygen Saturation -- 88% or higher 88% or higher 88% or higher 88% or higher            Exercise Comments:   Exercise Comments     Row Name 09/09/23 1121           Exercise Comments First full day of exercise!  Patient was oriented to gym and equipment including functions, settings, policies, and procedures.  Patient's individual exercise prescription and treatment plan were reviewed.  All starting workloads were established based on the results of the 6 minute walk test done at initial orientation visit.  The plan for exercise progression was also introduced and progression will be customized based on patient's performance and goals.                Exercise Goals and Review:   Exercise Goals     Row Name 09/06/23 1552             Exercise Goals   Increase Physical Activity Yes       Intervention Provide advice, education, support and counseling about physical activity/exercise needs.;Develop an individualized exercise prescription for aerobic and resistive training based on initial evaluation findings, risk stratification, comorbidities and participant's personal goals.       Expected Outcomes Long Term: Exercising regularly at least 3-5 days a week.;Long Term: Add in  home exercise to make exercise part of routine and to increase amount of physical activity.;Short Term: Attend  rehab on a regular basis to increase amount of physical activity.       Increase Strength and Stamina Yes       Intervention Provide advice, education, support and counseling about physical activity/exercise needs.;Develop an individualized exercise prescription for aerobic and resistive training based on initial evaluation findings, risk stratification, comorbidities and participant's personal goals.       Expected Outcomes Long Term: Improve cardiorespiratory fitness, muscular endurance and strength as measured by increased METs and functional capacity ( );Short Term: Perform resistance training exercises routinely during rehab and add in resistance training at home;Short Term: Increase workloads from initial exercise prescription for resistance, speed, and METs.       Able to understand and use rate of perceived exertion (RPE) scale Yes       Intervention Provide education and explanation on how to use RPE scale       Expected Outcomes Long Term:  Able to use RPE to guide intensity level when exercising independently;Short Term: Able to use RPE daily in rehab to express subjective intensity level       Able to understand and use Dyspnea scale Yes       Intervention Provide education and explanation on how to use Dyspnea scale       Expected Outcomes Long Term: Able to use Dyspnea scale to guide intensity level when exercising independently;Short Term: Able to use Dyspnea scale daily in rehab to express subjective sense of shortness of breath during exertion       Knowledge and understanding of Target Heart Rate Range (THRR) Yes       Intervention Provide education and explanation of THRR including how the numbers were predicted and where they are located for reference       Expected Outcomes Long Term: Able to use THRR to govern intensity when exercising independently;Short Term: Able to  use daily as guideline for intensity in rehab;Short Term: Able to state/look up THRR       Able to check pulse independently Yes       Intervention Review the importance of being able to check your own pulse for safety during independent exercise;Provide education and demonstration on how to check pulse in carotid and radial arteries.       Expected Outcomes Long Term: Able to check pulse independently and accurately;Short Term: Able to explain why pulse checking is important during independent exercise       Understanding of Exercise Prescription Yes       Intervention Provide education, explanation, and written materials on patient's individual exercise prescription       Expected Outcomes Short Term: Able to explain program exercise prescription;Long Term: Able to explain home exercise prescription to exercise independently                Exercise Goals Re-Evaluation :  Exercise Goals Re-Evaluation     Row Name 09/09/23 1121 09/20/23 1459 10/07/23 1127 10/12/23 1124 10/19/23 1005     Exercise Goal Re-Evaluation   Exercise Goals Review Able to understand and use rate of perceived exertion (RPE) scale;Knowledge and understanding of Target Heart Rate Range (THRR);Able to understand and use Dyspnea scale;Understanding of Exercise Prescription Increase Physical Activity;Understanding of Exercise Prescription;Increase Strength and Stamina Increase Physical Activity;Understanding of Exercise Prescription;Increase Strength and Stamina Increase Physical Activity;Understanding of Exercise Prescription;Increase Strength and Stamina;Able to understand and use Dyspnea scale;Knowledge and understanding of Target Heart Rate Range (THRR);Able to check pulse independently;Able to understand and use rate of perceived exertion (RPE)  scale Increase Physical Activity;Increase Strength and Stamina;Understanding of Exercise Prescription   Comments Reviewed RPE  and dyspnea scale, THR and program prescription with  pt today.  Pt voiced understanding and was given a copy of goals to take home. Jamesrobert is off to a good start in the program. He has had 4 exercise sessions so far. He has increased the treadmill to a speed of 2.6. We will continue to monitor his progress in the program. Trey Paula continues to do well in rehab. He recently increased his treadmill workload by adding 2% incline while maintaining a speed of 2.6 mph. He also improved to level 4 on the XR and level 3 on the biostep. We will continue to monitor his progress in the program. Reviewed home exercise with pt today.  Pt plans to use his stationary bike and walk at home for exercise. He also states that he lives close to a gym and may look into getting a membership. Reviewed THR, pulse, RPE, sign and symptoms, pulse oximetery and when to call 911 or MD.  Also discussed weather considerations and indoor options.  Pt voiced understanding. Trey Paula is doing well in rehab. He has stayed consistent at level 3 on the T5 nustep and XR, level 2 on the biostep, and level 4 on the T4 nustep. He also has fluctuated his treadmill workload with a speed ranging from 2.3-2.7 mph and an incline ranging from 1.5-2.5%. We will continue to monitor his progress in the program.   Expected Outcomes Short: Use RPE daily to regulate intensity. Long: Follow program prescription in THR. Short: Continue to follow current exercise prescription and progressively increase workloads. Long: Continue exercise to improve strength and stamina. Short: Continue to progressively increase workloads. Long: Continue exercise to improve strength and stamina. Short: Add four additional day of exercise in addition to coming once a week to rehab. Long: Continue to exercise independently. Short: Try increasing to level 3 on the biostep. Long: Continue to increase overall METs and stamina.            Discharge Exercise Prescription (Final Exercise Prescription Changes):  Exercise Prescription Changes -  10/19/23 1000       Response to Exercise   Blood Pressure (Admit) 112/64    Blood Pressure (Exercise) 178/82    Blood Pressure (Exit) 104/60    Heart Rate (Admit) 69 bpm    Heart Rate (Exercise) 97 bpm    Heart Rate (Exit) 72 bpm    Rating of Perceived Exertion (Exercise) 13    Symptoms none    Duration Continue with 30 min of aerobic exercise without signs/symptoms of physical distress.    Intensity THRR unchanged      Progression   Progression Continue to progress workloads to maintain intensity without signs/symptoms of physical distress.    Average METs 3.47      Resistance Training   Training Prescription Yes    Weight 5 lb    Reps 10-15      Interval Training   Interval Training No      Treadmill   MPH 2.7    Grade 1.5    Minutes 15    METs 2.63      NuStep   Level 4    Minutes 15    METs 4.7      REL-XR   Level 3    Minutes 15      T5 Nustep   Level 3    Minutes 15    METs 2.5  Biostep-RELP   Level 2    Minutes 15    METs 4      Home Exercise Plan   Plans to continue exercise at Home (comment)   stationary bike, walking at home, looking into getting a gym membership   Frequency Add 4 additional days to program exercise sessions.    Initial Home Exercises Provided 10/12/23      Oxygen   Maintain Oxygen Saturation 88% or higher             Nutrition:  Target Goals: Understanding of nutrition guidelines, daily intake of sodium 1500mg , cholesterol 200mg , calories 30% from fat and 7% or less from saturated fats, daily to have 5 or more servings of fruits and vegetables.  Education: All About Nutrition: -Group instruction provided by verbal, written material, interactive activities, discussions, models, and posters to present general guidelines for heart healthy nutrition including fat, fiber, MyPlate, the role of sodium in heart healthy nutrition, utilization of the nutrition label, and utilization of this knowledge for meal planning.  Follow up email sent as well. Written material given at graduation.   Biometrics:  Pre Biometrics - 09/06/23 1553       Pre Biometrics   Height 5\' 5"  (1.651 m)    Weight 163 lb 8 oz (74.2 kg)    Waist Circumference 37 inches    Hip Circumference 37.5 inches    Waist to Hip Ratio 0.99 %    BMI (Calculated) 27.21    Single Leg Stand 30 seconds              Nutrition Therapy Plan and Nutrition Goals:   Nutrition Assessments:  MEDIFICTS Score Key: >=70 Need to make dietary changes  40-70 Heart Healthy Diet <= 40 Therapeutic Level Cholesterol Diet  Flowsheet Row Cardiac Rehab from 09/12/2023 in [facility] Cardiac and Pulmonary Rehab  Picture Your Plate Total Score on Admission 66      Picture Your Plate Scores: <40 Unhealthy dietary pattern with much room for improvement. 41-50 Dietary pattern unlikely to meet recommendations for good health and room for improvement. 51-60 More healthful dietary pattern, with some room for improvement.  >60 Healthy dietary pattern, although there may be some specific behaviors that could be improved.    Nutrition Goals Re-Evaluation:   Nutrition Goals Discharge (Final Nutrition Goals Re-Evaluation):   Psychosocial: Target Goals: Acknowledge presence or absence of significant depression and/or stress, maximize coping skills, provide positive support system. Participant is able to verbalize types and ability to use techniques and skills needed for reducing stress and depression.   Education: Stress, Anxiety, and Depression - Group verbal and visual presentation to define topics covered.  Reviews how body is impacted by stress, anxiety, and depression.  Also discusses healthy ways to reduce stress and to treat/manage anxiety and depression.  Written material given at graduation. Flowsheet Row Cardiac Rehab from 10/12/2023 in [facility] Cardiac and Pulmonary Rehab  Date 10/05/23  Educator SB  Instruction Review Code 1- [employer] Understanding        Education: Sleep Hygiene -Provides group verbal and written instruction about how sleep can affect your health.  Define sleep hygiene, discuss sleep cycles and impact of sleep habits. Review good sleep hygiene tips.    Initial Review & Psychosocial Screening:  Initial Psych Review & Screening - 08/25/23 1005       Initial Review   Current issues with None Identified      Family Dynamics   Good Support System? Yes  Comments He can look to his wife for support. He is ready to start rehab and get into healthy lifestyle.      Barriers   Psychosocial barriers to participate in program There are no identifiable barriers or psychosocial needs.;The patient should benefit from training in stress management and relaxation.      Screening Interventions   Interventions Encouraged to exercise;To provide support and resources with identified psychosocial needs;Provide feedback about the scores to participant    Expected Outcomes Short Term goal: Utilizing psychosocial counselor, staff and physician to assist with identification of specific Stressors or current issues interfering with healing process. Setting desired goal for each stressor or current issue identified.;Long Term Goal: Stressors or current issues are controlled or eliminated.;Short Term goal: Identification and review with participant of any Quality of Life or Depression concerns found by scoring the questionnaire.;Long Term goal: The participant improves quality of Life and PHQ9 Scores as seen by post scores and/or verbalization of changes             Quality of Life Scores:   Quality of Life - 09/12/23 1121       Quality of Life   Select Quality of Life      Quality of Life Scores   Health/Function Pre 21.2 %    Socioeconomic Pre 20.64 %    Psych/Spiritual Pre 22.07 %    Family Pre 19.17 %    GLOBAL Pre 21.06 %            Scores of 19 and below usually indicate a poorer quality of life in these areas.  A  difference of  2-3 points is a clinically meaningful difference.  A difference of 2-3 points in the total score of the Quality of Life Index has been associated with significant improvement in overall quality of life, self-image, physical symptoms, and general health in studies assessing change in quality of life.  PHQ-9: Review Flowsheet  More data exists      09/06/2023 05/13/2023 05/02/2023 11/12/2022 01/22/2022  Depression screen PHQ 2/9  Decreased Interest 0 1 0 0 0  Down, Depressed, Hopeless 0 0 0 0 0  PHQ - 2 Score 0 1 0 0 0  Altered sleeping 2 1 0 - -  Tired, decreased energy 2 1 0 - -  Change in appetite 0 0 0 - -  Feeling bad or failure about yourself  0 0 0 - -  Trouble concentrating 0 0 0 - -  Moving slowly or fidgety/restless 0 0 0 - -  Suicidal thoughts 0 0 0 - -  PHQ-9 Score 4 3 0 - -  Difficult doing work/chores Somewhat difficult Not difficult at all Not difficult at all - -    Details           Interpretation of Total Score  Total Score Depression Severity:  1-4 = Minimal depression, 5-9 = Mild depression, 10-14 = Moderate depression, 15-19 = Moderately severe depression, 20-27 = Severe depression   Psychosocial Evaluation and Intervention:  Psychosocial Evaluation - 08/25/23 1008       Psychosocial Evaluation & Interventions   Interventions Encouraged to exercise with the program and follow exercise prescription;Relaxation education;Stress management education    Comments He can look to his wife for support. He is ready to start rehab and get into healthy lifestyle.    Expected Outcomes Short: Start [facility] to help with mood. Long: Maintain a healthy mental state    Continue Psychosocial Services  Follow  up required by staff             Psychosocial Re-Evaluation:   Psychosocial Discharge (Final Psychosocial Re-Evaluation):   Vocational Rehabilitation: Provide vocational rehab assistance to qualifying candidates.   Vocational Rehab Evaluation &  Intervention:   Education: Education Goals: Education classes will be provided on a variety of topics geared toward better understanding of heart health and risk factor modification. Participant will state understanding/return demonstration of topics presented as noted by education test scores.  Learning Barriers/Preferences:  Learning Barriers/Preferences - 08/25/23 1004       Learning Barriers/Preferences   Learning Barriers None    Learning Preferences None             General Cardiac Education Topics:  AED/CPR: - Group verbal and written instruction with the use of models to demonstrate the basic use of the AED with the basic ABC's of resuscitation.   Anatomy and Cardiac Procedures: - Group verbal and visual presentation and models provide information about basic cardiac anatomy and function. Reviews the testing methods done to diagnose heart disease and the outcomes of the test results. Describes the treatment choices: Medical Management, Angioplasty, or Coronary Bypass Surgery for treating various heart conditions including Myocardial Infarction, Angina, Valve Disease, and Cardiac Arrhythmias.  Written material given at graduation.   Medication Safety: - Group verbal and visual instruction to review commonly prescribed medications for heart and lung disease. Reviews the medication, class of the drug, and side effects. Includes the steps to properly store meds and maintain the prescription regimen.  Written material given at graduation. Flowsheet Row Cardiac Rehab from 10/12/2023 in [facility] Cardiac and Pulmonary Rehab  Date 09/14/23  Educator SB  Instruction Review Code 1- Verbalizes Understanding       Intimacy: - Group verbal instruction through game format to discuss how heart and lung disease can affect sexual intimacy. Written material given at graduation..   Know Your Numbers and Heart Failure: - Group verbal and visual instruction to discuss disease risk factors  for cardiac and pulmonary disease and treatment options.  Reviews associated critical values for Overweight/Obesity, Hypertension, Cholesterol, and Diabetes.  Discusses basics of heart failure: signs/symptoms and treatments.  Introduces Heart Failure Zone chart for action plan for heart failure.  Written material given at graduation. Flowsheet Row Cardiac Rehab from 10/12/2023 in [facility] Cardiac and Pulmonary Rehab  Date 09/28/23  Educator SB  Instruction Review Code 1- Verbalizes Understanding       Infection Prevention: - Provides verbal and written material to individual with discussion of infection control including proper hand washing and proper equipment cleaning during exercise session. Flowsheet Row Cardiac Rehab from 10/12/2023 in [facility] Cardiac and Pulmonary Rehab  Date 09/06/23  Educator mc  Instruction Review Code 1- Verbalizes Understanding       Falls Prevention: - Provides verbal and written material to individual with discussion of falls prevention and safety. Flowsheet Row Cardiac Rehab from 10/12/2023 in [facility] Cardiac and Pulmonary Rehab  Date 09/06/23  Educator mc  Instruction Review Code 1- Verbalizes Understanding       Other: -Provides group and verbal instruction on various topics (see comments)   Knowledge Questionnaire Score:  Knowledge Questionnaire Score - 09/12/23 1110       Knowledge Questionnaire Score   Pre Score 23             Core Components/Risk Factors/Patient Goals at Admission:  Personal Goals and Risk Factors at Admission - 09/06/23 1556  Core Components/Risk Factors/Patient Goals on Admission    Weight Management Yes;Weight Maintenance    Intervention Weight Management: Develop a combined nutrition and exercise program designed to reach desired caloric intake, while maintaining appropriate intake of nutrient and fiber, sodium and fats, and appropriate energy expenditure required for the weight goal.;Weight Management: Provide  education and appropriate resources to help participant work on and attain dietary goals.;Weight Management/Obesity: Establish reasonable short term and long term weight goals.    Admit Weight 163 lb 8 oz (74.2 kg)    Goal Weight: Short Term 160 lb (72.6 kg)    Goal Weight: Long Term 155 lb (70.3 kg)    Expected Outcomes Short Term: Continue to assess and modify interventions until short term weight is achieved;Long Term: Adherence to nutrition and physical activity/exercise program aimed toward attainment of established weight goal;Weight Maintenance: Understanding of the daily nutrition guidelines, which includes 25-35% calories from fat, 7% or less cal from saturated fats, less than 200mg  cholesterol, less than 1.5gm of sodium, & 5 or more servings of fruits and vegetables daily;Understanding recommendations for meals to include 15-35% energy as protein, 25-35% energy from fat, 35-60% energy from carbohydrates, less than 200mg  of dietary cholesterol, 20-35 gm of total fiber daily;Understanding of distribution of calorie intake throughout the day with the consumption of 4-5 meals/snacks    Hypertension Yes    Intervention Provide education on lifestyle modifcations including regular physical activity/exercise, weight management, moderate sodium restriction and increased consumption of fresh fruit, vegetables, and low fat dairy, alcohol moderation, and smoking cessation.;Monitor prescription use compliance.    Expected Outcomes Short Term: Continued assessment and intervention until BP is < 140/30mm HG in hypertensive participants. < 130/62mm HG in hypertensive participants with diabetes, heart failure or chronic kidney disease.;Long Term: Maintenance of blood pressure at goal levels.             Education:Diabetes - Individual verbal and written instruction to review signs/symptoms of diabetes, desired ranges of glucose level fasting, after meals and with exercise. Acknowledge that pre and post  exercise glucose checks will be done for 3 sessions at entry of program.   Core Components/Risk Factors/Patient Goals Review:    Core Components/Risk Factors/Patient Goals at Discharge (Final Review):    ITP Comments:  ITP Comments     Row Name 08/25/23 1003 09/06/23 1543 09/09/23 1120 09/28/23 1258 10/26/23 1238   ITP Comments Virtual Visit completed. Patient informed on EP and RD appointment and 6 Minute walk test. Patient also informed of patient health questionnaires on My Chart. Patient Verbalizes understanding. Visit diagnosis can be found in [facility] 07/13/2023. Completed and gym orientation. Initial ITP created and sent for review to Dr. Cristal Ford, Medical Director. First full day of exercise!  Patient was oriented to gym and equipment including functions, settings, policies, and procedures.  Patient's individual exercise prescription and treatment plan were reviewed.  All starting workloads were established based on the results of the 6 minute walk test done at initial orientation visit.  The plan for exercise progression was also introduced and progression will be customized based on patient's performance and goals. 30 Day review completed. Medical Director ITP review done, changes made as directed, and signed approval by Medical Director.    new to program 30 Day review completed. Medical Director ITP review done, changes made as directed, and signed approval by Medical Director.            Comments:

## 2023-10-28 ENCOUNTER — Encounter: Payer: 59 | Attending: Family Medicine | Admitting: *Deleted

## 2023-10-28 ENCOUNTER — Ambulatory Visit: Payer: 59 | Admitting: Physical Therapy

## 2023-10-28 DIAGNOSIS — M6281 Muscle weakness (generalized): Secondary | ICD-10-CM | POA: Insufficient documentation

## 2023-10-28 DIAGNOSIS — M25619 Stiffness of unspecified shoulder, not elsewhere classified: Secondary | ICD-10-CM | POA: Insufficient documentation

## 2023-10-28 DIAGNOSIS — M25511 Pain in right shoulder: Secondary | ICD-10-CM | POA: Insufficient documentation

## 2023-10-28 DIAGNOSIS — Z951 Presence of aortocoronary bypass graft: Secondary | ICD-10-CM | POA: Insufficient documentation

## 2023-10-28 DIAGNOSIS — Z48812 Encounter for surgical aftercare following surgery on the circulatory system: Secondary | ICD-10-CM | POA: Insufficient documentation

## 2023-10-28 NOTE — Progress Notes (Signed)
Daily Session Note  Patient Details  Name: Thomas Harper MRN: [id number] Date of Birth: Jul 01, 1961 Referring Provider:   Flowsheet Row Cardiac Rehab from 09/06/2023 in [facility] Cardiac and Pulmonary Rehab  Referring Provider Dr. Marikay Alar       Encounter Date: 10/28/2023  Check In:  Session Check In - 10/28/23 1119       Check-In   Supervising physician immediately available to respond to emergencies See telemetry face sheet for immediately available ER MD    Location [facility]-Cardiac & Pulmonary Rehab    Staff Present Ronette Deter, BS, Exercise Physiologist;Joseph Shelbie Proctor, RN, [place]    Virtual Visit No    Medication changes reported     No    Fall or balance concerns reported    No    Warm-up and Cool-down Performed on first and last piece of equipment    Resistance Training Performed Yes    VAD Patient? No    PAD/SET Patient? No      Pain Assessment   Currently in Pain? No/denies                Social History   Tobacco Use  Smoking Status Former   Current packs/day: 0.00   Types: Cigarettes   Quit date: 05/21/2021   Years since quitting: 2.4  Smokeless Tobacco Former    Goals Met:  Independence with exercise equipment Exercise tolerated well No report of concerns or symptoms today Strength training completed today  Goals Unmet:  Not Applicable  Comments: Pt able to follow exercise prescription today without complaint.  Will continue to monitor for progression.    Dr. Bethann Punches is Medical Director for [facility] Cardiac Rehabilitation.  Dr. Vida Rigger is Medical Director for [facility] Pulmonary Rehabilitation.
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
## 2023-11-02 ENCOUNTER — Ambulatory Visit: Payer: 59 | Admitting: Pulmonary Disease
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
## 2023-11-04 ENCOUNTER — Ambulatory Visit: Payer: 59 | Admitting: Physical Therapy

## 2023-11-04 ENCOUNTER — Encounter: Payer: 59 | Admitting: *Deleted

## 2023-11-04 ENCOUNTER — Encounter: Payer: Self-pay | Admitting: Physical Therapy

## 2023-11-04 DIAGNOSIS — M25619 Stiffness of unspecified shoulder, not elsewhere classified: Secondary | ICD-10-CM

## 2023-11-04 DIAGNOSIS — M25511 Pain in right shoulder: Secondary | ICD-10-CM

## 2023-11-04 DIAGNOSIS — Z951 Presence of aortocoronary bypass graft: Secondary | ICD-10-CM

## 2023-11-04 DIAGNOSIS — M6281 Muscle weakness (generalized): Secondary | ICD-10-CM

## 2023-11-04 DIAGNOSIS — Z48812 Encounter for surgical aftercare following surgery on the circulatory system: Secondary | ICD-10-CM | POA: Diagnosis not present

## 2023-11-04 NOTE — Progress Notes (Signed)
Daily Session Note  Patient Details  Name: HAZIQ SPIEWAK MRN: [id number] Date of Birth: February 09, 1961 Referring Provider:   Flowsheet Row Cardiac Rehab from 09/06/2023 in [facility] Cardiac and Pulmonary Rehab  Referring Provider Dr. Marikay Alar       Encounter Date: 11/04/2023  Check In:  Session Check In - 11/04/23 1116       Check-In   Supervising physician immediately available to respond to emergencies See telemetry face sheet for immediately available ER MD    Location [facility]-Cardiac & Pulmonary Rehab    Staff Present Cora Collum, RN, BSN, CCRP;Noah Tickle, BS, Exercise Physiologist;Joseph [place], [place]    Virtual Visit No    Medication changes reported     No    Fall or balance concerns reported    No    Warm-up and Cool-down Performed on first and last piece of equipment    Resistance Training Performed Yes    VAD Patient? No    PAD/SET Patient? No      Pain Assessment   Currently in Pain? No/denies                Social History   Tobacco Use  Smoking Status Former   Current packs/day: 0.00   Types: Cigarettes   Quit date: 05/21/2021   Years since quitting: 2.4  Smokeless Tobacco Former    Goals Met:  Independence with exercise equipment Exercise tolerated well No report of concerns or symptoms today  Goals Unmet:  Not Applicable  Comments: Pt able to follow exercise prescription today without complaint.  Will continue to monitor for progression.    Dr. Bethann Punches is Medical Director for [facility] Cardiac Rehabilitation.  Dr. Vida Rigger is Medical Director for [facility] Pulmonary Rehabilitation.

## 2023-11-11 ENCOUNTER — Encounter: Payer: 59 | Admitting: *Deleted

## 2023-11-11 ENCOUNTER — Ambulatory Visit: Payer: 59 | Admitting: Physical Therapy

## 2023-11-11 DIAGNOSIS — Z48812 Encounter for surgical aftercare following surgery on the circulatory system: Secondary | ICD-10-CM | POA: Diagnosis not present

## 2023-11-11 DIAGNOSIS — M6281 Muscle weakness (generalized): Secondary | ICD-10-CM

## 2023-11-11 DIAGNOSIS — M25511 Pain in right shoulder: Secondary | ICD-10-CM

## 2023-11-11 DIAGNOSIS — Z951 Presence of aortocoronary bypass graft: Secondary | ICD-10-CM

## 2023-11-11 DIAGNOSIS — M25619 Stiffness of unspecified shoulder, not elsewhere classified: Secondary | ICD-10-CM

## 2023-11-11 NOTE — Progress Notes (Signed)
Daily Session Note  Patient Details  Name: Thomas Harper MRN: [id number] Date of Birth: 07-09-1961 Referring Provider:   Flowsheet Row Cardiac Rehab from 09/06/2023 in [facility] Cardiac and Pulmonary Rehab  Referring Provider Dr. Marikay Alar       Encounter Date: 11/11/2023  Check In:  Session Check In - 11/11/23 1119       Check-In   Supervising physician immediately available to respond to emergencies See telemetry face sheet for immediately available ER MD    Location [facility]-Cardiac & Pulmonary Rehab    Staff Present Cora Collum, RN, BSN, CCRP;Noah Tickle, BS, Exercise Physiologist;Joseph [place], [place]    Virtual Visit No    Medication changes reported     No    Fall or balance concerns reported    No    Warm-up and Cool-down Performed on first and last piece of equipment    Resistance Training Performed Yes    VAD Patient? No    PAD/SET Patient? No      Pain Assessment   Currently in Pain? No/denies                Social History   Tobacco Use  Smoking Status Former   Current packs/day: 0.00   Types: Cigarettes   Quit date: 05/21/2021   Years since quitting: 2.4  Smokeless Tobacco Former    Goals Met:  Independence with exercise equipment Exercise tolerated well No report of concerns or symptoms today  Goals Unmet:  Not Applicable  Comments: Pt able to follow exercise prescription today without complaint.  Will continue to monitor for progression.    Dr. Bethann Punches is Medical Director for [facility] Cardiac Rehabilitation.  Dr. Vida Rigger is Medical Director for [facility] Pulmonary Rehabilitation.
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
## 2023-11-14 ENCOUNTER — Encounter: Payer: Self-pay | Admitting: Internal Medicine

## 2023-11-14 ENCOUNTER — Ambulatory Visit (INDEPENDENT_AMBULATORY_CARE_PROVIDER_SITE_OTHER): Payer: 59 | Admitting: Pulmonary Disease

## 2023-11-14 ENCOUNTER — Encounter: Payer: Self-pay | Admitting: Pulmonary Disease

## 2023-11-14 VITALS — BP 116/78 | HR 63 | Temp 97.8°F | Ht 66.0 in | Wt 177.0 lb

## 2023-11-14 DIAGNOSIS — R0600 Dyspnea, unspecified: Secondary | ICD-10-CM

## 2023-11-14 DIAGNOSIS — G4733 Obstructive sleep apnea (adult) (pediatric): Secondary | ICD-10-CM

## 2023-11-14 DIAGNOSIS — G4730 Sleep apnea, unspecified: Secondary | ICD-10-CM | POA: Insufficient documentation

## 2023-11-14 MED ORDER — STIOLTO RESPIMAT 2.5-2.5 MCG/ACT IN AERS: 2.0000 | INHALATION_SPRAY | Freq: Every day | RESPIRATORY_TRACT | Status: DC

## 2023-11-14 MED ORDER — ALBUTEROL SULFATE HFA 108 (90 BASE) MCG/ACT IN AERS: 2.0000 | INHALATION_SPRAY | Freq: Four times a day (QID) | RESPIRATORY_TRACT | 6 refills | Status: DC | PRN

## 2023-11-14 NOTE — Patient Instructions (Signed)
Nice to see you again I am glad you are doing well  Use Stiolto 2 puffs once a day every day.  I provided samples, should last 4 weeks.  If you find this helps your breathing during the day send a message I will prescribe.  If it does not help, no worries, no need to continue it.  I also prescribed an albuterol rescue inhaler for you to have.  Keep it with you at work in case you get more short of breath and need to use it.  We will order a CPAP machine with a nasal pillow to treat your sleep apnea.  Once the machine is ordered, please contact us to schedule an appointment 31 to 90 days after you start using it so we can go over the results of using it and how it is helping you.  It may be that your neck scheduled follow-up will fall within this timeframe and if so that be great.  If not we can reschedule.  Return to clinic in 3 months or sooner as needed with Dr. Judeth Horn

## 2023-11-14 NOTE — Progress Notes (Signed)
@Patient  ID: Thomas Harper, male    DOB: 12-Jan-1961, 62 y.o.   MRN: [id number]  Chief Complaint  Patient presents with  . Follow-up    Doing well. Still has snoring.     Referring provider: Dale [place], MD  HPI:   62 y.o. man with significant CAD status post CABG 06/2023 whom we are seeing for evaluation of dyspnea on exertion.  Most recent cardiology note x 3 reviewed.  Most recent PCP note reviewed.  Returns for routine follow-up.  Doing well.  Dyspnea much improved after CABG.  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  PFTs performed in the interim.  Full interpretation below.  This demonstrates air trapping.  Likely a contributor to possible symptoms.  Discussed role and rationale for inhaler therapy.  Reviewed sleep study performed at last visit.  This revealed mild sleep apnea AHI 9.7.  Recommended auto titrating CPAP.  He wishes to discuss today.  We discussed at length.  After some discussion he is okay trying if okay to use a nasal pillow.  Will prescribe with this.  HPI initial visit: Patient developed shortness of breath.  Over the last several months.  Workup including cardiology evaluation.  Underwent CT coronary 05-2023 with clear lungs but did demonstrate significant CAD with flow limitation as well as concern for left atrial mass.  Eventually underwent echocardiogram demonstrated normal EF.  Then underwent left heart cath with multivessel disease.  Recommended CABG.  Underwent CABG and left atrial myxoma resection.  Eventually discharged in the hospital.  Overall shortness of breath seems to be getting better.  Snores quite a bit.  Concerned about sleep apnea.  Reviewed most recent chest x-ray 07/22/2023.  Clear lungs.  He is a former smoker.  Questionaires / Pulmonary Flowsheets:   ACT:      No data to display          MMRC:     No data to display          Epworth:      No data to display          Tests:    FENO:  No results found for: "NITRICOXIDE"  PFT:    Latest Ref Rng & Units 09/28/2023    3:50 PM  PFT Results  FVC-Pre L 2.68   FVC-Predicted Pre % 66   FVC-Post L 2.97   FVC-Predicted Post % 73   Pre FEV1/FVC % % 75   Post FEV1/FCV % % 74   FEV1-Pre L 2.02   FEV1-Predicted Pre % 66   FEV1-Post L 2.20   DLCO uncorrected ml/min/mmHg 16.58   DLCO UNC% % 68   DLCO corrected ml/min/mmHg 16.78   DLCO COR %Predicted % 69   DLVA Predicted % 80   TLC L 5.64   TLC % Predicted % 91   RV % Predicted % 128   Personally reviewed interpreted spirometry suggestive of moderate restriction versus air trapping.  No bronchodilator response.  Lung volumes consistent with air trapping.  DLCO moderately reduced.  WALK:      No data to display          Imaging: Personally reviewed and as per EMR and discussion in this note No results found.  Lab Results: Personally reviewed CBC    Component Value Date/Time   WBC 5.9 09/13/2023 1214   RBC 5.01 09/13/2023 1214   HGB 14.2 09/13/2023 1214   HGB 12.5 (L)  08/03/2023 1447   HCT 44.3 09/13/2023 1214   HCT 39.2 08/03/2023 1447   PLT 297.0 09/13/2023 1214   PLT 621 (H) 08/03/2023 1447   MCV 88.4 09/13/2023 1214   MCV 90 08/03/2023 1447   MCH 28.6 08/03/2023 1447   MCH 29.0 07/17/2023 0712   MCHC 32.0 09/13/2023 1214   RDW 14.8 09/13/2023 1214   RDW 12.5 08/03/2023 1447   LYMPHSABS 1.4 09/13/2023 1214   MONOABS 0.6 09/13/2023 1214   EOSABS 0.1 09/13/2023 1214   BASOSABS 0.0 09/13/2023 1214    BMET    Component Value Date/Time   NA 138 09/13/2023 1214   NA 138 08/03/2023 1447   K 4.9 09/13/2023 1214   CL 102 09/13/2023 1214   CO2 26 09/13/2023 1214   GLUCOSE 83 09/13/2023 1214   BUN 18 09/13/2023 1214   BUN 18 08/03/2023 1447   CREATININE 0.98 09/13/2023 1214   CALCIUM 9.6 09/13/2023 1214   GFRNONAA >60 07/18/2023 0215   GFRAA >60 11/04/2018 1539    BNP No results found for: "BNP"  ProBNP No results found for:  "PROBNP"  Specialty Problems       Pulmonary Problems   SOB (shortness of breath)    Pulmonary 07/2023 - recommended PFTs (3 months after CABG)      Snoring    Evaluated by pulmonary 07/2023 - plan HST.         Allergies  Allergen Reactions  . Fish Oil Rash  . Lisinopril Other (See Comments)    Fatigue  . Simvastatin Rash    Immunization History  Administered Date(s) Administered  . Influenza, Seasonal, Injecte, Preservative Fre 09/13/2023  . Influenza,inj,Quad PF,6+ Mos 11/04/2017, 09/15/2018, 10/19/2019, 10/03/2020, 11/12/2022  . PPD Test 06/13/2012    Past Medical History:  Diagnosis Date  . Anginal pain (HCC)   . Chronic leg pain    s/p injury right leg (age 21)  . Coronary artery disease   . GERD (gastroesophageal reflux disease)   . History of ankle fracture    persistent pain  . History of kidney stones   . Hypercholesterolemia   . Hypertension   . Nephrolithiasis    followed by Assunta Gambles  . Vitamin D deficiency     Tobacco History: Social History   Tobacco Use  Smoking Status Former  . Current packs/day: 0.00  . Types: Cigarettes  . Quit date: 05/21/2021  . Years since quitting: 2.4  Smokeless Tobacco Former   Counseling given: Not Answered   Continue to not smoke  Outpatient Encounter Medications as of 11/14/2023  Medication Sig  . acetaminophen (TYLENOL) 500 MG tablet Take 1,000 mg by mouth every 8 (eight) hours as needed for headache.  Marland Kitchen aspirin EC 81 MG tablet Take 1 tablet (81 mg total) by mouth daily. Swallow whole.  Marland Kitchen atorvastatin (LIPITOR) 80 MG tablet Take 1 tablet (80 mg total) by mouth daily.  . calcium carbonate (TUMS - DOSED IN MG ELEMENTAL CALCIUM) 500 MG chewable tablet Chew 1 tablet by mouth continuous as needed for indigestion or heartburn.  . clopidogrel (PLAVIX) 75 MG tablet Take 1 tablet (75 mg total) by mouth daily.  . lansoprazole (PREVACID) 15 MG capsule Take 15 mg by mouth daily in the afternoon.  . pantoprazole  (PROTONIX) 40 MG tablet TAKE ONE TABLET (40 MG TOTAL) BY MOUTH DAILY. TAKE 30 MINUTES BEFORE YOUR EVENING MEAL  . traMADol (ULTRAM) 50 MG tablet Take 1 tablet (50 mg total) by mouth every 6 (six)  hours as needed.  . metoprolol tartrate (LOPRESSOR) 50 MG tablet Take 1 tablet (50 mg total) by mouth 2 (two) times daily.   No facility-administered encounter medications on file as of 11/14/2023.     Review of Systems  Review of Systems  N/a Physical Exam  BP 116/78 (BP Location: Right Arm, Patient Position: Sitting, Cuff Size: Small)   Pulse 63   Temp 97.8 F (36.6 C) (Oral)   Ht 5\' 6"  (1.676 m)   Wt 177 lb (80.3 kg)   SpO2 96%   BMI 28.57 kg/m   Wt Readings from Last 5 Encounters:  11/14/23 177 lb (80.3 kg)  10/11/23 168 lb 6.4 oz (76.4 kg)  10/06/23 168 lb (76.2 kg)  09/13/23 164 lb 6.4 oz (74.6 kg)  09/12/23 166 lb 6.4 oz (75.5 kg)    BMI Readings from Last 5 Encounters:  11/14/23 28.57 kg/m  10/11/23 27.18 kg/m  10/06/23 27.12 kg/m  09/13/23 26.53 kg/m  09/12/23 27.69 kg/m     Physical Exam General: Sitting in chair, no acute distress Eyes: EOMI, no icterus Neck: Supple, no JVP appreciated Cardiovascular: Warm, no edema Pulmonary: Clear, no work of breathing Abdomen: Nondistended, bowel sounds present MSK: No synovitis, no joint effusion Neuro: Normal gait, no weakness Psych: Normal mood, full affect   Assessment & Plan:   Dyspnea on exertion: Marked improvement after CABG, likely mostly due to CAD.  Atrial fibrillation could be contributing as well.  History of smoking.  PFTs 09/2023 did reveal air trapping.  All symptoms largely improved okay to trial bronchodilators.  Stiolto samples.  Albuterol as needed prescribed today..  Obstructive sleep apnea: Loud snoring etc. in the past.  Sleep study 09/2023 reveals mild sleep apnea AHI 9.7.  Recommend CPAP.  Will place order for auto titrating CPAP.  With nasal pillows.  Will need CPAP adherence visit in the  future once he starts using machine.   Return in about 3 months (around 02/14/2024) for f/u Dr. Judeth Horn.   Karren Burly, MD 11/14/2023   This appointment required 40 minutes of patient care (this includes precharting, chart review, review of results, face-to-face care, etc.).

## 2023-11-16 ENCOUNTER — Encounter: Payer: Self-pay | Admitting: *Deleted

## 2023-11-16 DIAGNOSIS — Z951 Presence of aortocoronary bypass graft: Secondary | ICD-10-CM

## 2023-11-16 NOTE — Progress Notes (Signed)
Cardiac Individual Treatment Plan  Patient Details  Name: Thomas Harper MRN: [id number] Date of Birth: 1961/01/24 Referring Provider:   Flowsheet Row Cardiac Rehab from 09/06/2023 in [facility]  Referring Provider Dr. Marikay Alar       Initial Encounter Date:  Flowsheet Row Cardiac Rehab from 09/06/2023 in [facility] Cardiac and Pulmonary Rehab  Date 09/06/23       Visit Diagnosis: S/P CABG x 3  Patient's Home Medications on Admission:  Current Outpatient Medications:    acetaminophen (TYLENOL) 500 MG tablet, Take 1,000 mg by mouth every 8 (eight) hours as needed for headache., Disp: , Rfl:    albuterol (VENTOLIN HFA) 108 (90 Base) MCG/ACT inhaler, Inhale 2 puffs into the lungs every 6 (six) hours as needed for wheezing or shortness of breath., Disp: 8 g, Rfl: 6   aspirin EC 81 MG tablet, Take 1 tablet (81 mg total) by mouth daily. Swallow whole., Disp: 90 tablet, Rfl: 3   atorvastatin (LIPITOR) 80 MG tablet, Take 1 tablet (80 mg total) by mouth daily., Disp: 30 tablet, Rfl: 11   calcium carbonate (TUMS - DOSED IN MG ELEMENTAL CALCIUM) 500 MG chewable tablet, Chew 1 tablet by mouth continuous as needed for indigestion or heartburn., Disp: , Rfl:    clopidogrel (PLAVIX) 75 MG tablet, Take 1 tablet (75 mg total) by mouth daily., Disp: 90 tablet, Rfl: 3   lansoprazole (PREVACID) 15 MG capsule, Take 15 mg by mouth daily in the afternoon., Disp: , Rfl:    metoprolol tartrate (LOPRESSOR) 50 MG tablet, Take 1 tablet (50 mg total) by mouth 2 (two) times daily., Disp: 180 tablet, Rfl: 3   pantoprazole (PROTONIX) 40 MG tablet, TAKE ONE TABLET (40 MG TOTAL) BY MOUTH DAILY. TAKE 30 MINUTES BEFORE YOUR EVENING MEAL, Disp: 90 tablet, Rfl: 3   Tiotropium Bromide-Olodaterol (STIOLTO RESPIMAT) 2.5-2.5 MCG/ACT AERS, Inhale 2 puffs into the lungs daily., Disp: , Rfl:    traMADol (ULTRAM) 50 MG tablet, Take 1 tablet (50 mg total) by mouth every 6 (six) hours as needed., Disp: 15  tablet, Rfl: 0  Past Medical History: Past Medical History:  Diagnosis Date   Anginal pain (HCC)    Chronic leg pain    s/p injury right leg (age 32)   Coronary artery disease    GERD (gastroesophageal reflux disease)    History of ankle fracture    persistent pain   History of kidney stones    Hypercholesterolemia    Hypertension    Nephrolithiasis    followed by Assunta Gambles   Vitamin D deficiency     Tobacco Use: Social History   Tobacco Use  Smoking Status Former   Current packs/day: 0.00   Types: Cigarettes   Quit date: 05/21/2021   Years since quitting: 2.4  Smokeless Tobacco Former    Labs: Review Flowsheet  More data exists      Latest Ref Rng & Units 11/12/2022 03/18/2023 07/11/2023 07/13/2023 07/29/2023  Labs for ITP Cardiac and Pulmonary Rehab  Cholestrol 0 - 200 mg/dL 829  562  - - 130   LDL (calc) 0 - 99 mg/dL 865  784  - - 72   HDL-C >39.00 mg/dL 69.62  95.28  - - 41.32   Trlycerides 0.0 - 149.0 mg/dL 44.0  102.7  - - 253.6   Hemoglobin A1c 4.8 - 5.6 % - 6.2  5.9  - -  PH, Arterial 7.35 - 7.45 - - 7.42  7.355  7.358  7.338  7.444  7.362  -  PCO2 arterial 32 - 48 mmHg - - 38  37.6  40.9  45.1  32.1  44.0  -  Bicarbonate 20.0 - 28.0 mmol/L - - 24.6  21.1  23.2  24.2  22.0  25.0  -  TCO2 22 - 32 mmol/L - - - 22  25  24  26  26  27  24  23  24  27  26   -  Acid-base deficit 0.0 - 2.0 mmol/L - - - 4.0  2.0  2.0  2.0  1.0  -  O2 Saturation % - - 99  99  99  99  100  100  -    Details       Multiple values from one day are sorted in reverse-chronological order          Exercise Target Goals: Exercise Program Goal: Individual exercise prescription set using results from initial 6 min walk test and THRR while considering  patient's activity barriers and safety.   Exercise Prescription Goal: Initial exercise prescription builds to 30-45 minutes a day of aerobic activity, 2-3 days per week.  Home exercise guidelines will be given to patient during program as  part of exercise prescription that the participant will acknowledge.   Education: Aerobic Exercise: - Group verbal and visual presentation on the components of exercise prescription. Introduces F.I.T.T principle from ACSM for exercise prescriptions.  Reviews F.I.T.T. principles of aerobic exercise including progression. Written material given at graduation.   Education: Resistance Exercise: - Group verbal and visual presentation on the components of exercise prescription. Introduces F.I.T.T principle from ACSM for exercise prescriptions  Reviews F.I.T.T. principles of resistance exercise including progression. Written material given at graduation.    Education: Exercise & Equipment Safety: - Individual verbal instruction and demonstration of equipment use and safety with use of the equipment. Flowsheet Row Cardiac Rehab from 10/12/2023 in [facility] Cardiac and Pulmonary Rehab  Date 09/06/23  Educator mc  Instruction Review Code 1- Verbalizes Understanding       Education: Exercise Physiology & General Exercise Guidelines: - Group verbal and written instruction with models to review the exercise physiology of the cardiovascular system and associated critical values. Provides general exercise guidelines with specific guidelines to those with heart or lung disease.  Flowsheet Row Cardiac Rehab from 10/12/2023 in [facility] Cardiac and Pulmonary Rehab  Date 10/12/23  Educator NT  Instruction Review Code 1- [employer] Understanding       Education: Flexibility, Balance, Mind/Body Relaxation: - Group verbal and visual presentation with interactive activity on the components of exercise prescription. Introduces F.I.T.T principle from ACSM for exercise prescriptions. Reviews F.I.T.T. principles of flexibility and balance exercise training including progression. Also discusses the mind body connection.  Reviews various relaxation techniques to help reduce and manage stress (i.e. Deep breathing,  progressive muscle relaxation, and visualization). Balance handout provided to take home. Written material given at graduation.   Activity Barriers & Risk Stratification:  Activity Barriers & Cardiac Risk Stratification - 09/06/23 1547       Activity Barriers & Cardiac Risk Stratification   Activity Barriers None    Cardiac Risk Stratification High             6 Minute Walk:  6 Minute Walk     Row Name 09/06/23 1545         6 Minute Walk   Phase Initial     Distance 1200 feet     Walk  Time 6 minutes     # of Rest Breaks 0     MPH 2.3     METS 3.1     RPE 11     Perceived Dyspnea  0     VO2 Peak 10.7     Symptoms No     Resting HR 72 bpm     Resting BP 106/64     Resting Oxygen Saturation  96 %     Exercise Oxygen Saturation  during 6 min walk 98 %     Max Ex. HR 107 bpm     Max Ex. BP 114/70     2 Minute Post BP 110/68              Oxygen Initial Assessment:   Oxygen Re-Evaluation:   Oxygen Discharge (Final Oxygen Re-Evaluation):   Initial Exercise Prescription:  Initial Exercise Prescription - 09/06/23 1500       Date of Initial Exercise RX and Referring Provider   Date 09/06/23    Referring Provider Dr. Marikay Alar      Oxygen   Maintain Oxygen Saturation 88% or higher      Treadmill   MPH 2.3    Grade 0    Minutes 15    METs 2.76      NuStep   Level 2    Minutes 15    METs 3.1      REL-XR   Level 2    Minutes 15    METs 3.1      T5 Nustep   Level 2    Minutes 15    METs 3.1      Biostep-RELP   Level 2    Minutes 15    METs 3.1      Prescription Details   Duration Progress to 30 minutes of continuous aerobic without signs/symptoms of physical distress      Intensity   THRR 40-80% of Max Heartrate 106-140    Ratings of Perceived Exertion 11-13    Perceived Dyspnea 0-4      Progression   Progression Continue to progress workloads to maintain intensity without signs/symptoms of physical distress.       Resistance Training   Training Prescription Yes    Weight 5lb    Reps 10-15             Perform Capillary Blood Glucose checks as needed.  Exercise Prescription Changes:   Exercise Prescription Changes     Row Name 09/06/23 1500 09/20/23 1400 10/07/23 1100 10/12/23 1100 10/19/23 1000     Response to Exercise   Blood Pressure (Admit) 106/64 102/58 128/72 -- 112/64   Blood Pressure (Exercise) 114/70 142/80 132/62 -- 178/82   Blood Pressure (Exit) 110/68 96/64 110/66 -- 104/60   Heart Rate (Admit) 72 bpm 86 bpm 74 bpm -- 69 bpm   Heart Rate (Exercise) 107 bpm 103 bpm 96 bpm -- 97 bpm   Heart Rate (Exit) 73 bpm 77 bpm 74 bpm -- 72 bpm   Oxygen Saturation (Admit) 96 % -- -- -- --   Oxygen Saturation (Exercise) 98 % -- -- -- --   Oxygen Saturation (Exit) 98 % -- -- -- --   Rating of Perceived Exertion (Exercise) 11 14 14  -- 13   Perceived Dyspnea (Exercise) 0 -- -- -- --   Symptoms none -- none -- none   Comments Orientation -- -- -- --   Duration Progress to 30 minutes of  aerobic  without signs/symptoms of physical distress Continue with 30 min of aerobic exercise without signs/symptoms of physical distress. Continue with 30 min of aerobic exercise without signs/symptoms of physical distress. -- Continue with 30 min of aerobic exercise without signs/symptoms of physical distress.   Intensity THRR unchanged THRR unchanged THRR unchanged -- THRR unchanged     Progression   Progression Continue to progress workloads to maintain intensity without signs/symptoms of physical distress. Continue to progress workloads to maintain intensity without signs/symptoms of physical distress. Continue to progress workloads to maintain intensity without signs/symptoms of physical distress. -- Continue to progress workloads to maintain intensity without signs/symptoms of physical distress.   Average METs 3.1 3.03 3.32 -- 3.47     Resistance Training   Training Prescription -- Yes Yes -- Yes    Weight -- 5lb 5lb -- 5 lb   Reps -- 10-15 10-15 -- 10-15     Interval Training   Interval Training -- -- No -- No     Treadmill   MPH -- 2.6 2.6 -- 2.7   Grade -- 0 2 -- 1.5   Minutes -- 15 15 -- 15   METs -- 2.99 3.71 -- 2.63     NuStep   Level -- 4 -- -- 4   Minutes -- 15 -- -- 15   METs -- 4.3 -- -- 4.7     REL-XR   Level -- 3 4 -- 3   Minutes -- 15 15 -- 15   METs -- 3.1 3.7 -- --     T5 Nustep   Level -- 3 3 -- 3   Minutes -- 15 15 -- 15   METs -- 2.3 2.1 -- 2.5     Biostep-RELP   Level -- 2 3 -- 2   Minutes -- 15 15 -- 15   METs -- 3 -- -- 4     Home Exercise Plan   Plans to continue exercise at -- -- -- Home (comment)  stationary bike, walking at home, looking into getting a gym membership Home (comment)  stationary bike, walking at home, looking into getting a gym membership   Frequency -- -- -- Add 4 additional days to program exercise sessions. Add 4 additional days to program exercise sessions.   Initial Home Exercises Provided -- -- -- 10/12/23 10/12/23     Oxygen   Maintain Oxygen Saturation -- 88% or higher 88% or higher 88% or higher 88% or higher    Row Name 11/04/23 1100             Response to Exercise   Blood Pressure (Admit) 100/60       Blood Pressure (Exit) 100/56       Heart Rate (Admit) 62 bpm       Heart Rate (Exercise) 100 bpm       Heart Rate (Exit) 70 bpm       Rating of Perceived Exertion (Exercise) 14       Symptoms none       Duration Continue with 30 min of aerobic exercise without signs/symptoms of physical distress.       Intensity THRR unchanged         Progression   Progression Continue to progress workloads to maintain intensity without signs/symptoms of physical distress.       Average METs 3.6         Resistance Training   Training Prescription Yes       Weight 5 lb  Reps 10-15         Interval Training   Interval Training No         Treadmill   MPH 2.3       Grade 2.5       Minutes 15       METs 3.55          NuStep   Level 4       Minutes 15       METs 3         Home Exercise Plan   Plans to continue exercise at Home (comment)  stationary bike, walking at home, looking into getting a gym membership       Frequency Add 4 additional days to program exercise sessions.       Initial Home Exercises Provided 10/12/23         Oxygen   Maintain Oxygen Saturation 88% or higher                Exercise Comments:   Exercise Comments     Row Name 09/09/23 1121           Exercise Comments First full day of exercise!  Patient was oriented to gym and equipment including functions, settings, policies, and procedures.  Patient's individual exercise prescription and treatment plan were reviewed.  All starting workloads were established based on the results of the 6 minute walk test done at initial orientation visit.  The plan for exercise progression was also introduced and progression will be customized based on patient's performance and goals.                Exercise Goals and Review:   Exercise Goals     Row Name 09/06/23 1552             Exercise Goals   Increase Physical Activity Yes       Intervention Provide advice, education, support and counseling about physical activity/exercise needs.;Develop an individualized exercise prescription for aerobic and resistive training based on initial evaluation findings, risk stratification, comorbidities and participant's personal goals.       Expected Outcomes Long Term: Exercising regularly at least 3-5 days a week.;Long Term: Add in home exercise to make exercise part of routine and to increase amount of physical activity.;Short Term: Attend rehab on a regular basis to increase amount of physical activity.       Increase Strength and Stamina Yes       Intervention Provide advice, education, support and counseling about physical activity/exercise needs.;Develop an individualized exercise prescription for aerobic and resistive  training based on initial evaluation findings, risk stratification, comorbidities and participant's personal goals.       Expected Outcomes Long Term: Improve cardiorespiratory fitness, muscular endurance and strength as measured by increased METs and functional capacity ( );Short Term: Perform resistance training exercises routinely during rehab and add in resistance training at home;Short Term: Increase workloads from initial exercise prescription for resistance, speed, and METs.       Able to understand and use rate of perceived exertion (RPE) scale Yes       Intervention Provide education and explanation on how to use RPE scale       Expected Outcomes Long Term:  Able to use RPE to guide intensity level when exercising independently;Short Term: Able to use RPE daily in rehab to express subjective intensity level       Able to understand and use Dyspnea scale Yes  Intervention Provide education and explanation on how to use Dyspnea scale       Expected Outcomes Long Term: Able to use Dyspnea scale to guide intensity level when exercising independently;Short Term: Able to use Dyspnea scale daily in rehab to express subjective sense of shortness of breath during exertion       Knowledge and understanding of Target Heart Rate Range (THRR) Yes       Intervention Provide education and explanation of THRR including how the numbers were predicted and where they are located for reference       Expected Outcomes Long Term: Able to use THRR to govern intensity when exercising independently;Short Term: Able to use daily as guideline for intensity in rehab;Short Term: Able to state/look up THRR       Able to check pulse independently Yes       Intervention Review the importance of being able to check your own pulse for safety during independent exercise;Provide education and demonstration on how to check pulse in carotid and radial arteries.       Expected Outcomes Long Term: Able to check pulse  independently and accurately;Short Term: Able to explain why pulse checking is important during independent exercise       Understanding of Exercise Prescription Yes       Intervention Provide education, explanation, and written materials on patient's individual exercise prescription       Expected Outcomes Short Term: Able to explain program exercise prescription;Long Term: Able to explain home exercise prescription to exercise independently                Exercise Goals Re-Evaluation :  Exercise Goals Re-Evaluation     Row Name 09/09/23 1121 09/20/23 1459 10/07/23 1127 10/12/23 1124 10/19/23 1005     Exercise Goal Re-Evaluation   Exercise Goals Review Able to understand and use rate of perceived exertion (RPE) scale;Knowledge and understanding of Target Heart Rate Range (THRR);Able to understand and use Dyspnea scale;Understanding of Exercise Prescription Increase Physical Activity;Understanding of Exercise Prescription;Increase Strength and Stamina Increase Physical Activity;Understanding of Exercise Prescription;Increase Strength and Stamina Increase Physical Activity;Understanding of Exercise Prescription;Increase Strength and Stamina;Able to understand and use Dyspnea scale;Knowledge and understanding of Target Heart Rate Range (THRR);Able to check pulse independently;Able to understand and use rate of perceived exertion (RPE) scale Increase Physical Activity;Increase Strength and Stamina;Understanding of Exercise Prescription   Comments Reviewed RPE  and dyspnea scale, THR and program prescription with pt today.  Pt voiced understanding and was given a copy of goals to take home. Winnie is off to a good start in the program. He has had 4 exercise sessions so far. He has increased the treadmill to a speed of 2.6. We will continue to monitor his progress in the program. Trey Paula continues to do well in rehab. He recently increased his treadmill workload by adding 2% incline while maintaining a  speed of 2.6 mph. He also improved to level 4 on the XR and level 3 on the biostep. We will continue to monitor his progress in the program. Reviewed home exercise with pt today.  Pt plans to use his stationary bike and walk at home for exercise. He also states that he lives close to a gym and may look into getting a membership. Reviewed THR, pulse, RPE, sign and symptoms, pulse oximetery and when to call 911 or MD.  Also discussed weather considerations and indoor options.  Pt voiced understanding. Trey Paula is doing well in rehab. He has stayed consistent  at level 3 on the T5 nustep and XR, level 2 on the biostep, and level 4 on the T4 nustep. He also has fluctuated his treadmill workload with a speed ranging from 2.3-2.7 mph and an incline ranging from 1.5-2.5%. We will continue to monitor his progress in the program.   Expected Outcomes Short: Use RPE daily to regulate intensity. Long: Follow program prescription in THR. Short: Continue to follow current exercise prescription and progressively increase workloads. Long: Continue exercise to improve strength and stamina. Short: Continue to progressively increase workloads. Long: Continue exercise to improve strength and stamina. Short: Add four additional day of exercise in addition to coming once a week to rehab. Long: Continue to exercise independently. Short: Try increasing to level 3 on the biostep. Long: Continue to increase overall METs and stamina.    Row Name 11/04/23 1127             Exercise Goal Re-Evaluation   Exercise Goals Review Increase Physical Activity;Increase Strength and Stamina;Understanding of Exercise Prescription       Comments Trey Paula continues to do well in rehab, although he is only coming once a week due to work. He has continued to do well on the treadmill at a speed of 2.3 mph with a 2.5% incline. He also has stayed consistent at level 4 on the T4 nustep and using 5 lb handweights for resistance training. We will continue to  monitor his progress in the program.       Expected Outcomes Short: Continue to progressively increase workloads and exercise on days away from rehab. Long: Continue to increase overall METs and stamina.                Discharge Exercise Prescription (Final Exercise Prescription Changes):  Exercise Prescription Changes - 11/04/23 1100       Response to Exercise   Blood Pressure (Admit) 100/60    Blood Pressure (Exit) 100/56    Heart Rate (Admit) 62 bpm    Heart Rate (Exercise) 100 bpm    Heart Rate (Exit) 70 bpm    Rating of Perceived Exertion (Exercise) 14    Symptoms none    Duration Continue with 30 min of aerobic exercise without signs/symptoms of physical distress.    Intensity THRR unchanged      Progression   Progression Continue to progress workloads to maintain intensity without signs/symptoms of physical distress.    Average METs 3.6      Resistance Training   Training Prescription Yes    Weight 5 lb    Reps 10-15      Interval Training   Interval Training No      Treadmill   MPH 2.3    Grade 2.5    Minutes 15    METs 3.55      NuStep   Level 4    Minutes 15    METs 3      Home Exercise Plan   Plans to continue exercise at Home (comment)   stationary bike, walking at home, looking into getting a gym membership   Frequency Add 4 additional days to program exercise sessions.    Initial Home Exercises Provided 10/12/23      Oxygen   Maintain Oxygen Saturation 88% or higher             Nutrition:  Target Goals: Understanding of nutrition guidelines, daily intake of sodium 1500mg , cholesterol 200mg , calories 30% from fat and 7% or less from saturated fats, daily to  have 5 or more servings of fruits and vegetables.  Education: All About Nutrition: -Group instruction provided by verbal, written material, interactive activities, discussions, models, and posters to present general guidelines for heart healthy nutrition including fat, fiber, MyPlate,  the role of sodium in heart healthy nutrition, utilization of the nutrition label, and utilization of this knowledge for meal planning. Follow up email sent as well. Written material given at graduation.   Biometrics:  Pre Biometrics - 09/06/23 1553       Pre Biometrics   Height 5\' 5"  (1.651 m)    Weight 163 lb 8 oz (74.2 kg)    Waist Circumference 37 inches    Hip Circumference 37.5 inches    Waist to Hip Ratio 0.99 %    BMI (Calculated) 27.21    Single Leg Stand 30 seconds              Nutrition Therapy Plan and Nutrition Goals:   Nutrition Assessments:  MEDIFICTS Score Key: >=70 Need to make dietary changes  40-70 Heart Healthy Diet <= 40 Therapeutic Level Cholesterol Diet  Flowsheet Row Cardiac Rehab from 09/12/2023 in [facility] Cardiac and Pulmonary Rehab  Picture Your Plate Total Score on Admission 66      Picture Your Plate Scores: <40 Unhealthy dietary pattern with much room for improvement. 41-50 Dietary pattern unlikely to meet recommendations for good health and room for improvement. 51-60 More healthful dietary pattern, with some room for improvement.  >60 Healthy dietary pattern, although there may be some specific behaviors that could be improved.    Nutrition Goals Re-Evaluation:  Nutrition Goals Re-Evaluation     Row Name 11/11/23 1119             Goals   Comment Patient deferred dietitian appointment                Nutrition Goals Discharge (Final Nutrition Goals Re-Evaluation):  Nutrition Goals Re-Evaluation - 11/11/23 1119       Goals   Comment Patient deferred dietitian appointment             Psychosocial: Target Goals: Acknowledge presence or absence of significant depression and/or stress, maximize coping skills, provide positive support system. Participant is able to verbalize types and ability to use techniques and skills needed for reducing stress and depression.   Education: Stress, Anxiety, and Depression - Group  verbal and visual presentation to define topics covered.  Reviews how body is impacted by stress, anxiety, and depression.  Also discusses healthy ways to reduce stress and to treat/manage anxiety and depression.  Written material given at graduation. Flowsheet Row Cardiac Rehab from 10/12/2023 in [facility] Cardiac and Pulmonary Rehab  Date 10/05/23  Educator SB  Instruction Review Code 1- [employer] Understanding       Education: Sleep Hygiene -Provides group verbal and written instruction about how sleep can affect your health.  Define sleep hygiene, discuss sleep cycles and impact of sleep habits. Review good sleep hygiene tips.    Initial Review & Psychosocial Screening:  Initial Psych Review & Screening - 08/25/23 1005       Initial Review   Current issues with None Identified      Family Dynamics   Good Support System? Yes    Comments He can look to his wife for support. He is ready to start rehab and get into healthy lifestyle.      Barriers   Psychosocial barriers to participate in program There are no identifiable barriers or psychosocial  needs.;The patient should benefit from training in stress management and relaxation.      Screening Interventions   Interventions Encouraged to exercise;To provide support and resources with identified psychosocial needs;Provide feedback about the scores to participant    Expected Outcomes Short Term goal: Utilizing psychosocial counselor, staff and physician to assist with identification of specific Stressors or current issues interfering with healing process. Setting desired goal for each stressor or current issue identified.;Long Term Goal: Stressors or current issues are controlled or eliminated.;Short Term goal: Identification and review with participant of any Quality of Life or Depression concerns found by scoring the questionnaire.;Long Term goal: The participant improves quality of Life and PHQ9 Scores as seen by post scores and/or  verbalization of changes             Quality of Life Scores:   Quality of Life - 09/12/23 1121       Quality of Life   Select Quality of Life      Quality of Life Scores   Health/Function Pre 21.2 %    Socioeconomic Pre 20.64 %    Psych/Spiritual Pre 22.07 %    Family Pre 19.17 %    GLOBAL Pre 21.06 %            Scores of 19 and below usually indicate a poorer quality of life in these areas.  A difference of  2-3 points is a clinically meaningful difference.  A difference of 2-3 points in the total score of the Quality of Life Index has been associated with significant improvement in overall quality of life, self-image, physical symptoms, and general health in studies assessing change in quality of life.  PHQ-9: Review Flowsheet  More data exists      09/06/2023 05/13/2023 05/02/2023 11/12/2022 01/22/2022  Depression screen PHQ 2/9  Decreased Interest 0 1 0 0 0  Down, Depressed, Hopeless 0 0 0 0 0  PHQ - 2 Score 0 1 0 0 0  Altered sleeping 2 1 0 - -  Tired, decreased energy 2 1 0 - -  Change in appetite 0 0 0 - -  Feeling bad or failure about yourself  0 0 0 - -  Trouble concentrating 0 0 0 - -  Moving slowly or fidgety/restless 0 0 0 - -  Suicidal thoughts 0 0 0 - -  PHQ-9 Score 4 3 0 - -  Difficult doing work/chores Somewhat difficult Not difficult at all Not difficult at all - -    Details           Interpretation of Total Score  Total Score Depression Severity:  1-4 = Minimal depression, 5-9 = Mild depression, 10-14 = Moderate depression, 15-19 = Moderately severe depression, 20-27 = Severe depression   Psychosocial Evaluation and Intervention:  Psychosocial Evaluation - 08/25/23 1008       Psychosocial Evaluation & Interventions   Interventions Encouraged to exercise with the program and follow exercise prescription;Relaxation education;Stress management education    Comments He can look to his wife for support. He is ready to start rehab and get into  healthy lifestyle.    Expected Outcomes Short: Start [facility] to help with mood. Long: Maintain a healthy mental state    Continue Psychosocial Services  Follow up required by staff             Psychosocial Re-Evaluation:  Psychosocial Re-Evaluation     Row Name 11/11/23 1118  Psychosocial Re-Evaluation   Current issues with None Identified       Comments Patient reports no issues with their current mental states, sleep, stress, depression or anxiety. Will follow up with patient in a few weeks for any changes.       Expected Outcomes Short: Continue to exercise regularly to support mental health and notify staff of any changes. Long: maintain mental health and well being through teaching of rehab or prescribed medications independently.       Interventions Encouraged to attend Cardiac Rehabilitation for the exercise       Continue Psychosocial Services  Follow up required by staff                Psychosocial Discharge (Final Psychosocial Re-Evaluation):  Psychosocial Re-Evaluation - 11/11/23 1118       Psychosocial Re-Evaluation   Current issues with None Identified    Comments Patient reports no issues with their current mental states, sleep, stress, depression or anxiety. Will follow up with patient in a few weeks for any changes.    Expected Outcomes Short: Continue to exercise regularly to support mental health and notify staff of any changes. Long: maintain mental health and well being through teaching of rehab or prescribed medications independently.    Interventions Encouraged to attend Cardiac Rehabilitation for the exercise    Continue Psychosocial Services  Follow up required by staff             Vocational Rehabilitation: Provide vocational rehab assistance to qualifying candidates.   Vocational Rehab Evaluation & Intervention:   Education: Education Goals: Education classes will be provided on a variety of topics geared toward better  understanding of heart health and risk factor modification. Participant will state understanding/return demonstration of topics presented as noted by education test scores.  Learning Barriers/Preferences:  Learning Barriers/Preferences - 08/25/23 1004       Learning Barriers/Preferences   Learning Barriers None    Learning Preferences None             General Cardiac Education Topics:  AED/CPR: - Group verbal and written instruction with the use of models to demonstrate the basic use of the AED with the basic ABC's of resuscitation.   Anatomy and Cardiac Procedures: - Group verbal and visual presentation and models provide information about basic cardiac anatomy and function. Reviews the testing methods done to diagnose heart disease and the outcomes of the test results. Describes the treatment choices: Medical Management, Angioplasty, or Coronary Bypass Surgery for treating various heart conditions including Myocardial Infarction, Angina, Valve Disease, and Cardiac Arrhythmias.  Written material given at graduation.   Medication Safety: - Group verbal and visual instruction to review commonly prescribed medications for heart and lung disease. Reviews the medication, class of the drug, and side effects. Includes the steps to properly store meds and maintain the prescription regimen.  Written material given at graduation. Flowsheet Row Cardiac Rehab from 10/12/2023 in [facility] Cardiac and Pulmonary Rehab  Date 09/14/23  Educator SB  Instruction Review Code 1- Verbalizes Understanding       Intimacy: - Group verbal instruction through game format to discuss how heart and lung disease can affect sexual intimacy. Written material given at graduation..   Know Your Numbers and Heart Failure: - Group verbal and visual instruction to discuss disease risk factors for cardiac and pulmonary disease and treatment options.  Reviews associated critical values for Overweight/Obesity,  Hypertension, Cholesterol, and Diabetes.  Discusses basics of heart failure: signs/symptoms and treatments.  Introduces Heart Failure Zone chart for action plan for heart failure.  Written material given at graduation. Flowsheet Row Cardiac Rehab from 10/12/2023 in [facility] Cardiac and Pulmonary Rehab  Date 09/28/23  Educator SB  Instruction Review Code 1- Verbalizes Understanding       Infection Prevention: - Provides verbal and written material to individual with discussion of infection control including proper hand washing and proper equipment cleaning during exercise session. Flowsheet Row Cardiac Rehab from 10/12/2023 in [facility] Cardiac and Pulmonary Rehab  Date 09/06/23  Educator mc  Instruction Review Code 1- Verbalizes Understanding       Falls Prevention: - Provides verbal and written material to individual with discussion of falls prevention and safety. Flowsheet Row Cardiac Rehab from 10/12/2023 in [facility] Cardiac and Pulmonary Rehab  Date 09/06/23  Educator mc  Instruction Review Code 1- Verbalizes Understanding       Other: -Provides group and verbal instruction on various topics (see comments)   Knowledge Questionnaire Score:  Knowledge Questionnaire Score - 09/12/23 1110       Knowledge Questionnaire Score   Pre Score 23             Core Components/Risk Factors/Patient Goals at Admission:  Personal Goals and Risk Factors at Admission - 09/06/23 1556       Core Components/Risk Factors/Patient Goals on Admission    Weight Management Yes;Weight Maintenance    Intervention Weight Management: Develop a combined nutrition and exercise program designed to reach desired caloric intake, while maintaining appropriate intake of nutrient and fiber, sodium and fats, and appropriate energy expenditure required for the weight goal.;Weight Management: Provide education and appropriate resources to help participant work on and attain dietary goals.;Weight Management/Obesity:  Establish reasonable short term and long term weight goals.    Admit Weight 163 lb 8 oz (74.2 kg)    Goal Weight: Short Term 160 lb (72.6 kg)    Goal Weight: Long Term 155 lb (70.3 kg)    Expected Outcomes Short Term: Continue to assess and modify interventions until short term weight is achieved;Long Term: Adherence to nutrition and physical activity/exercise program aimed toward attainment of established weight goal;Weight Maintenance: Understanding of the daily nutrition guidelines, which includes 25-35% calories from fat, 7% or less cal from saturated fats, less than 200mg  cholesterol, less than 1.5gm of sodium, & 5 or more servings of fruits and vegetables daily;Understanding recommendations for meals to include 15-35% energy as protein, 25-35% energy from fat, 35-60% energy from carbohydrates, less than 200mg  of dietary cholesterol, 20-35 gm of total fiber daily;Understanding of distribution of calorie intake throughout the day with the consumption of 4-5 meals/snacks    Hypertension Yes    Intervention Provide education on lifestyle modifcations including regular physical activity/exercise, weight management, moderate sodium restriction and increased consumption of fresh fruit, vegetables, and low fat dairy, alcohol moderation, and smoking cessation.;Monitor prescription use compliance.    Expected Outcomes Short Term: Continued assessment and intervention until BP is < 140/26mm HG in hypertensive participants. < 130/42mm HG in hypertensive participants with diabetes, heart failure or chronic kidney disease.;Long Term: Maintenance of blood pressure at goal levels.             Education:Diabetes - Individual verbal and written instruction to review signs/symptoms of diabetes, desired ranges of glucose level fasting, after meals and with exercise. Acknowledge that pre and post exercise glucose checks will be done for 3 sessions at entry of program.   Core Components/Risk Factors/Patient Goals  Review:   Goals  and Risk Factor Review     Row Name 11/11/23 1120             Core Components/Risk Factors/Patient Goals Review   Personal Goals Review Weight Management/Obesity       Review Trey Paula states that he wants to maintain his weight at this point. He has started working and feels pretty well health wise. He was 170lbs today and wants to maintain that weight. He has no questions about his medications at this time.       Expected Outcomes Short: continue to exercise. Long: maintain weight independently                Core Components/Risk Factors/Patient Goals at Discharge (Final Review):   Goals and Risk Factor Review - 11/11/23 1120       Core Components/Risk Factors/Patient Goals Review   Personal Goals Review Weight Management/Obesity    Review Trey Paula states that he wants to maintain his weight at this point. He has started working and feels pretty well health wise. He was 170lbs today and wants to maintain that weight. He has no questions about his medications at this time.    Expected Outcomes Short: continue to exercise. Long: maintain weight independently             ITP Comments:  ITP Comments     Row Name 08/25/23 1003 09/06/23 1543 09/09/23 1120 09/28/23 1258 10/26/23 1238   ITP Comments Virtual Visit completed. Patient informed on EP and RD appointment and 6 Minute walk test. Patient also informed of patient health questionnaires on My Chart. Patient Verbalizes understanding. Visit diagnosis can be found in [facility] 07/13/2023. Completed and gym orientation. Initial ITP created and sent for review to Dr. Cristal Ford, Medical Director. First full day of exercise!  Patient was oriented to gym and equipment including functions, settings, policies, and procedures.  Patient's individual exercise prescription and treatment plan were reviewed.  All starting workloads were established based on the results of the 6 minute walk test done at initial orientation visit.  The  plan for exercise progression was also introduced and progression will be customized based on patient's performance and goals. 30 Day review completed. Medical Director ITP review done, changes made as directed, and signed approval by Medical Director.    new to program 30 Day review completed. Medical Director ITP review done, changes made as directed, and signed approval by Medical Director.    Row Name 11/16/23 1006           ITP Comments 30 Day review completed. Medical Director ITP review done, changes made as directed, and signed approval by Medical Director.                Comments:

## 2023-11-18 ENCOUNTER — Ambulatory Visit: Payer: 59

## 2023-11-18 ENCOUNTER — Encounter: Payer: 59 | Admitting: Physical Therapy

## 2023-11-18 ENCOUNTER — Encounter: Payer: 59 | Admitting: *Deleted

## 2023-11-18 DIAGNOSIS — M25619 Stiffness of unspecified shoulder, not elsewhere classified: Secondary | ICD-10-CM

## 2023-11-18 DIAGNOSIS — M25511 Pain in right shoulder: Secondary | ICD-10-CM

## 2023-11-18 DIAGNOSIS — Z48812 Encounter for surgical aftercare following surgery on the circulatory system: Secondary | ICD-10-CM | POA: Diagnosis not present

## 2023-11-18 DIAGNOSIS — Z951 Presence of aortocoronary bypass graft: Secondary | ICD-10-CM

## 2023-11-18 DIAGNOSIS — M6281 Muscle weakness (generalized): Secondary | ICD-10-CM
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
## 2023-11-18 NOTE — Progress Notes (Signed)
Daily Session Note  Patient Details  Name: Thomas Harper MRN: [id number] Date of Birth: 30-Aug-1961 Referring Provider:   Flowsheet Row Cardiac Rehab from 09/06/2023 in [facility]  Referring Provider Dr. Marikay Alar       Encounter Date: 11/18/2023  Check In:  Session Check In - 11/18/23 1121       Check-In   Supervising physician immediately available to respond to emergencies See telemetry face sheet for immediately available ER MD    Location [facility]-Cardiac & Pulmonary Rehab    Staff Present Cora Collum, RN, BSN, CCRP;Noah Tickle, BS, Exercise Physiologist;Joseph Norris, [place]    Virtual Visit No    Medication changes reported     No    Fall or balance concerns reported    No    Warm-up and Cool-down Performed on first and last piece of equipment    Resistance Training Performed Yes    VAD Patient? No    PAD/SET Patient? No      Pain Assessment   Currently in Pain? No/denies                Social History   Tobacco Use  Smoking Status Former   Current packs/day: 0.00   Types: Cigarettes   Quit date: 05/21/2021   Years since quitting: 2.4  Smokeless Tobacco Former    Goals Met:  Independence with exercise equipment Exercise tolerated well No report of concerns or symptoms today  Goals Unmet:  Not Applicable  Comments: Pt able to follow exercise prescription today without complaint.  Will continue to monitor for progression.    Dr. Bethann Punches is Medical Director for [facility] Cardiac Rehabilitation.  Dr. Vida Rigger is Medical Director for [facility] Pulmonary Rehabilitation.

## 2023-12-02 ENCOUNTER — Encounter: Payer: 59 | Attending: Family Medicine | Admitting: *Deleted

## 2023-12-02 ENCOUNTER — Ambulatory Visit: Payer: 59 | Admitting: Physical Therapy

## 2023-12-02 DIAGNOSIS — Z951 Presence of aortocoronary bypass graft: Secondary | ICD-10-CM | POA: Insufficient documentation

## 2023-12-02 DIAGNOSIS — M25619 Stiffness of unspecified shoulder, not elsewhere classified: Secondary | ICD-10-CM | POA: Insufficient documentation

## 2023-12-02 DIAGNOSIS — M6281 Muscle weakness (generalized): Secondary | ICD-10-CM | POA: Insufficient documentation

## 2023-12-02 DIAGNOSIS — M25511 Pain in right shoulder: Secondary | ICD-10-CM | POA: Insufficient documentation
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
## 2023-12-02 NOTE — Progress Notes (Signed)
Daily Session Note  Patient Details  Name: BRADDOCK THORNGREN MRN: [id number] Date of Birth: 04-01-1961 Referring Provider:   Flowsheet Row Cardiac Rehab from 09/06/2023 in [facility] Cardiac and Pulmonary Rehab  Referring Provider Dr. Marikay Alar       Encounter Date: 12/02/2023  Check In:  Session Check In - 12/02/23 1122       Check-In   Supervising physician immediately available to respond to emergencies See telemetry face sheet for immediately available ER MD    Location [facility]-Cardiac & Pulmonary Rehab    Staff Present Cora Collum, RN, BSN, CCRP;Joseph Hood, RCP,RRT,BSRT;Noah Tickle, [place], Exercise Physiologist    Virtual Visit No    Medication changes reported     No    Fall or balance concerns reported    No    Warm-up and Cool-down Performed on first and last piece of equipment    Resistance Training Performed Yes    VAD Patient? No    PAD/SET Patient? No      Pain Assessment   Currently in Pain? No/denies                Social History   Tobacco Use  Smoking Status Former   Current packs/day: 0.00   Types: Cigarettes   Quit date: 05/21/2021   Years since quitting: 2.5  Smokeless Tobacco Former    Goals Met:  Independence with exercise equipment Exercise tolerated well No report of concerns or symptoms today  Goals Unmet:  Not Applicable  Comments: Pt able to follow exercise prescription today without complaint.  Will continue to monitor for progression.    Dr. Bethann Punches is Medical Director for [facility] Cardiac Rehabilitation.  Dr. Vida Rigger is Medical Director for [facility] Pulmonary Rehabilitation.

## 2023-12-09 ENCOUNTER — Ambulatory Visit (INDEPENDENT_AMBULATORY_CARE_PROVIDER_SITE_OTHER): Payer: 59 | Admitting: Internal Medicine

## 2023-12-09 ENCOUNTER — Ambulatory Visit: Payer: 59 | Admitting: Physical Therapy

## 2023-12-09 ENCOUNTER — Ambulatory Visit: Payer: 59

## 2023-12-09 ENCOUNTER — Encounter: Payer: 59 | Admitting: *Deleted

## 2023-12-09 VITALS — BP 128/76 | HR 67 | Temp 97.5°F | Ht 66.0 in | Wt 169.2 lb

## 2023-12-09 DIAGNOSIS — K219 Gastro-esophageal reflux disease without esophagitis: Secondary | ICD-10-CM

## 2023-12-09 DIAGNOSIS — I10 Essential (primary) hypertension: Secondary | ICD-10-CM | POA: Diagnosis not present

## 2023-12-09 DIAGNOSIS — Z951 Presence of aortocoronary bypass graft: Secondary | ICD-10-CM | POA: Diagnosis not present

## 2023-12-09 DIAGNOSIS — Z800 Family history of malignant neoplasm of digestive organs: Secondary | ICD-10-CM

## 2023-12-09 DIAGNOSIS — D75839 Thrombocytosis, unspecified: Secondary | ICD-10-CM

## 2023-12-09 DIAGNOSIS — Z125 Encounter for screening for malignant neoplasm of prostate: Secondary | ICD-10-CM

## 2023-12-09 DIAGNOSIS — E7800 Pure hypercholesterolemia, unspecified: Secondary | ICD-10-CM

## 2023-12-09 DIAGNOSIS — D151 Benign neoplasm of heart: Secondary | ICD-10-CM

## 2023-12-09 DIAGNOSIS — M25511 Pain in right shoulder: Secondary | ICD-10-CM

## 2023-12-09 DIAGNOSIS — G4730 Sleep apnea, unspecified: Secondary | ICD-10-CM

## 2023-12-09 DIAGNOSIS — R739 Hyperglycemia, unspecified: Secondary | ICD-10-CM | POA: Diagnosis not present

## 2023-12-09 DIAGNOSIS — I25119 Atherosclerotic heart disease of native coronary artery with unspecified angina pectoris: Secondary | ICD-10-CM
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
## 2023-12-09 NOTE — Progress Notes (Signed)
Daily Session Note  Patient Details  Name: Thomas Harper MRN: [id number] Date of Birth: 09-09-1961 Referring Provider:   Flowsheet Row Cardiac Rehab from 09/06/2023 in [facility] Cardiac and Pulmonary Rehab  Referring Provider Dr. Marikay Alar       Encounter Date: 12/09/2023  Check In:  Session Check In - 12/09/23 1114       Check-In   Supervising physician immediately available to respond to emergencies See telemetry face sheet for immediately available ER MD    Location [facility]-Cardiac & Pulmonary Rehab    Staff Present Ronette Deter, BS, Exercise Physiologist;Joseph Shelbie Proctor, RN, [place]    Virtual Visit No    Medication changes reported     No    Fall or balance concerns reported    No    Warm-up and Cool-down Performed on first and last piece of equipment    Resistance Training Performed Yes    VAD Patient? No    PAD/SET Patient? No      Pain Assessment   Currently in Pain? No/denies                Social History   Tobacco Use  Smoking Status Former   Current packs/day: 0.00   Types: Cigarettes   Quit date: 05/21/2021   Years since quitting: 2.5  Smokeless Tobacco Former    Goals Met:  Independence with exercise equipment Exercise tolerated well No report of concerns or symptoms today Strength training completed today  Goals Unmet:  Not Applicable  Comments: Pt able to follow exercise prescription today without complaint.  Will continue to monitor for progression.    Dr. Bethann Punches is Medical Director for [facility] Cardiac Rehabilitation.  Dr. Vida Rigger is Medical Director for [facility] Pulmonary Rehabilitation.

## 2023-12-10 ENCOUNTER — Encounter: Payer: Self-pay | Admitting: Internal Medicine

## 2023-12-10 ENCOUNTER — Telehealth: Payer: Self-pay | Admitting: Internal Medicine

## 2023-12-10 NOTE — Assessment & Plan Note (Signed)
On high dose lipitor. Did not tolerate repatha.  Continue lipitor. Low cholesterol diet and exercise.  Follow lipid panel and liver function tests.

## 2023-12-10 NOTE — Assessment & Plan Note (Signed)
S/p removal during recent CT surgery.

## 2023-12-10 NOTE — Telephone Encounter (Signed)
Per note, he had colonoscopy 1-01/2023 - Dr Mia Creek. Need copy of report.  Also, need to contact pulmonary.  He saw pulmonary about sleep apnea. Having trouble tolerating cpap. Please schedule f/u with pulmonary to discuss.

## 2023-12-10 NOTE — Assessment & Plan Note (Signed)
Platelet count wnl - 09/13/23.

## 2023-12-10 NOTE — Assessment & Plan Note (Signed)
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 Continue risk factor modification. Continue plavix, metoprolol and lipitor. Continue f/u with cardiology. Continue cardiac rehab.

## 2023-12-10 NOTE — Assessment & Plan Note (Signed)
S/p colonoscopy 01/2023. Small TA. Recommended f/u in 5 years. Need copy of report.

## 2023-12-10 NOTE — Assessment & Plan Note (Signed)
Blood pressure as outlined.  Continue metoprolol.  Follow pressures.  Follow metabolic panel.  

## 2023-12-10 NOTE — Assessment & Plan Note (Signed)
PT.  Better. Still with some pain as outlined. Wants to continue to follow.

## 2023-12-10 NOTE — Assessment & Plan Note (Signed)
Low carb diet and exercise.  Follow met b and a1c.   

## 2023-12-10 NOTE — Assessment & Plan Note (Signed)
Protonix/pepcid.  Follow.

## 2023-12-10 NOTE — Assessment & Plan Note (Signed)
F/u pulmonary 10/2023 - Obstructive sleep apnea: Loud snoring etc. in the past.  Sleep study 09/2023 reveals mild sleep apnea AHI 9.7.  Recommend CPAP.  Order for auto titrating CPAP.  With nasal pillows.  Discussed today. Not tolerating.  Discussed the need for f/u with pulmonary.

## 2023-12-14 ENCOUNTER — Encounter: Payer: Self-pay | Admitting: *Deleted

## 2023-12-14 DIAGNOSIS — Z951 Presence of aortocoronary bypass graft: Secondary | ICD-10-CM

## 2023-12-14 NOTE — Progress Notes (Signed)
Cardiac Individual Treatment Plan  Patient Details  Name: Thomas Harper MRN: [id number] Date of Birth: July 30, 1961 Referring Provider:   Flowsheet Row Cardiac Rehab from 09/06/2023 in [facility] Cardiac and Pulmonary Rehab  Referring Provider Dr. Marikay Alar       Initial Encounter Date:  Flowsheet Row Cardiac Rehab from 09/06/2023 in [facility] Cardiac and Pulmonary Rehab  Date 09/06/23       Visit Diagnosis: S/P CABG x 3  Patient's Home Medications on Admission:  Current Outpatient Medications:    acetaminophen (TYLENOL) 500 MG tablet, Take 1,000 mg by mouth every 8 (eight) hours as needed for headache., Disp: , Rfl:    albuterol (VENTOLIN HFA) 108 (90 Base) MCG/ACT inhaler, Inhale 2 puffs into the lungs every 6 (six) hours as needed for wheezing or shortness of breath., Disp: 8 g, Rfl: 6   aspirin EC 81 MG tablet, Take 1 tablet (81 mg total) by mouth daily. Swallow whole., Disp: 90 tablet, Rfl: 3   atorvastatin (LIPITOR) 80 MG tablet, Take 1 tablet (80 mg total) by mouth daily., Disp: 30 tablet, Rfl: 11   calcium carbonate (TUMS - DOSED IN MG ELEMENTAL CALCIUM) 500 MG chewable tablet, Chew 1 tablet by mouth continuous as needed for indigestion or heartburn., Disp: , Rfl:    clopidogrel (PLAVIX) 75 MG tablet, Take 1 tablet (75 mg total) by mouth daily., Disp: 90 tablet, Rfl: 3   lansoprazole (PREVACID) 15 MG capsule, Take 15 mg by mouth daily in the afternoon., Disp: , Rfl:    metoprolol tartrate (LOPRESSOR) 50 MG tablet, Take 1 tablet (50 mg total) by mouth 2 (two) times daily., Disp: 180 tablet, Rfl: 3   pantoprazole (PROTONIX) 40 MG tablet, TAKE ONE TABLET (40 MG TOTAL) BY MOUTH DAILY. TAKE 30 MINUTES BEFORE YOUR EVENING MEAL, Disp: 90 tablet, Rfl: 3   Tiotropium Bromide-Olodaterol (STIOLTO RESPIMAT) 2.5-2.5 MCG/ACT AERS, Inhale 2 puffs into the lungs daily., Disp: , Rfl:    traMADol (ULTRAM) 50 MG tablet, Take 1 tablet (50 mg total) by mouth every 6 (six) hours as needed., Disp: 15  tablet, Rfl: 0  Past Medical History: Past Medical History:  Diagnosis Date   Anginal pain (HCC)    Chronic leg pain    s/p injury right leg (age 85)   Coronary artery disease    GERD (gastroesophageal reflux disease)    History of ankle fracture    persistent pain   History of kidney stones    Hypercholesterolemia    Hypertension    Nephrolithiasis    followed by Assunta Gambles   Vitamin D deficiency     Tobacco Use: Social History   Tobacco Use  Smoking Status Former   Current packs/day: 0.00   Types: Cigarettes   Quit date: 05/21/2021   Years since quitting: 2.5  Smokeless Tobacco Former    Labs: Review Flowsheet  More data exists      Latest Ref Rng & Units 11/12/2022 03/18/2023 07/11/2023 07/13/2023 07/29/2023  Labs for ITP Cardiac and Pulmonary Rehab  Cholestrol 0 - 200 mg/dL 829  562  - - 130   LDL (calc) 0 - 99 mg/dL 865  784  - - 72   HDL-C >39.00 mg/dL 69.62  95.28  - - 41.32   Trlycerides 0.0 - 149.0 mg/dL 44.0  102.7  - - 253.6   Hemoglobin A1c 4.8 - 5.6 % - 6.2  5.9  - -  PH, Arterial 7.35 - 7.45 - - 7.42  7.355  7.358  7.338  7.444  7.362  -  PCO2 arterial 32 - 48 mmHg - - 38  37.6  40.9  45.1  32.1  44.0  -  Bicarbonate 20.0 - 28.0 mmol/L - - 24.6  21.1  23.2  24.2  22.0  25.0  -  TCO2 22 - 32 mmol/L - - - 22  25  24  26  26  27  24  23  24  27  26   -  Acid-base deficit 0.0 - 2.0 mmol/L - - - 4.0  2.0  2.0  2.0  1.0  -  O2 Saturation % - - 99  99  99  99  100  100  -    Details       Multiple values from one day are sorted in reverse-chronological order          Exercise Target Goals: Exercise Program Goal: Individual exercise prescription set using results from initial 6 min walk test and THRR while considering  patient's activity barriers and safety.   Exercise Prescription Goal: Initial exercise prescription builds to 30-45 minutes a day of aerobic activity, 2-3 days per week.  Home exercise guidelines will be given to patient during program as  part of exercise prescription that the participant will acknowledge.   Education: Aerobic Exercise: - Group verbal and visual presentation on the components of exercise prescription. Introduces F.I.T.T principle from ACSM for exercise prescriptions.  Reviews F.I.T.T. principles of aerobic exercise including progression. Written material given at graduation.   Education: Resistance Exercise: - Group verbal and visual presentation on the components of exercise prescription. Introduces F.I.T.T principle from ACSM for exercise prescriptions  Reviews F.I.T.T. principles of resistance exercise including progression. Written material given at graduation.    Education: Exercise & Equipment Safety: - Individual verbal instruction and demonstration of equipment use and safety with use of the equipment. Flowsheet Row Cardiac Rehab from 10/12/2023 in [facility] Cardiac and Pulmonary Rehab  Date 09/06/23  Educator mc  Instruction Review Code 1- Verbalizes Understanding       Education: Exercise Physiology & General Exercise Guidelines: - Group verbal and written instruction with models to review the exercise physiology of the cardiovascular system and associated critical values. Provides general exercise guidelines with specific guidelines to those with heart or lung disease.  Flowsheet Row Cardiac Rehab from 10/12/2023 in [facility] Cardiac and Pulmonary Rehab  Date 10/12/23  Educator NT  Instruction Review Code 1- [employer] Understanding       Education: Flexibility, Balance, Mind/Body Relaxation: - Group verbal and visual presentation with interactive activity on the components of exercise prescription. Introduces F.I.T.T principle from ACSM for exercise prescriptions. Reviews F.I.T.T. principles of flexibility and balance exercise training including progression. Also discusses the mind body connection.  Reviews various relaxation techniques to help reduce and manage stress (i.e. Deep breathing,  progressive muscle relaxation, and visualization). Balance handout provided to take home. Written material given at graduation.   Activity Barriers & Risk Stratification:  Activity Barriers & Cardiac Risk Stratification - 09/06/23 1547       Activity Barriers & Cardiac Risk Stratification   Activity Barriers None    Cardiac Risk Stratification High             6 Minute Walk:  6 Minute Walk     Row Name 09/06/23 1545         6 Minute Walk   Phase Initial     Distance 1200 feet     Walk  Time 6 minutes     # of Rest Breaks 0     MPH 2.3     METS 3.1     RPE 11     Perceived Dyspnea  0     VO2 Peak 10.7     Symptoms No     Resting HR 72 bpm     Resting BP 106/64     Resting Oxygen Saturation  96 %     Exercise Oxygen Saturation  during 6 min walk 98 %     Max Ex. HR 107 bpm     Max Ex. BP 114/70     2 Minute Post BP 110/68              Oxygen Initial Assessment:   Oxygen Re-Evaluation:   Oxygen Discharge (Final Oxygen Re-Evaluation):   Initial Exercise Prescription:  Initial Exercise Prescription - 09/06/23 1500       Date of Initial Exercise RX and Referring Provider   Date 09/06/23    Referring Provider Dr. Marikay Alar      Oxygen   Maintain Oxygen Saturation 88% or higher      Treadmill   MPH 2.3    Grade 0    Minutes 15    METs 2.76      NuStep   Level 2    Minutes 15    METs 3.1      REL-XR   Level 2    Minutes 15    METs 3.1      T5 Nustep   Level 2    Minutes 15    METs 3.1      Biostep-RELP   Level 2    Minutes 15    METs 3.1      Prescription Details   Duration Progress to 30 minutes of continuous aerobic without signs/symptoms of physical distress      Intensity   THRR 40-80% of Max Heartrate 106-140    Ratings of Perceived Exertion 11-13    Perceived Dyspnea 0-4      Progression   Progression Continue to progress workloads to maintain intensity without signs/symptoms of physical distress.       Resistance Training   Training Prescription Yes    Weight 5lb    Reps 10-15             Perform Capillary Blood Glucose checks as needed.  Exercise Prescription Changes:   Exercise Prescription Changes     Row Name 09/06/23 1500 09/20/23 1400 10/07/23 1100 10/12/23 1100 10/19/23 1000     Response to Exercise   Blood Pressure (Admit) 106/64 102/58 128/72 -- 112/64   Blood Pressure (Exercise) 114/70 142/80 132/62 -- 178/82   Blood Pressure (Exit) 110/68 96/64 110/66 -- 104/60   Heart Rate (Admit) 72 bpm 86 bpm 74 bpm -- 69 bpm   Heart Rate (Exercise) 107 bpm 103 bpm 96 bpm -- 97 bpm   Heart Rate (Exit) 73 bpm 77 bpm 74 bpm -- 72 bpm   Oxygen Saturation (Admit) 96 % -- -- -- --   Oxygen Saturation (Exercise) 98 % -- -- -- --   Oxygen Saturation (Exit) 98 % -- -- -- --   Rating of Perceived Exertion (Exercise) 11 14 14  -- 13   Perceived Dyspnea (Exercise) 0 -- -- -- --   Symptoms none -- none -- none   Comments Orientation -- -- -- --   Duration Progress to 30 minutes of  aerobic  without signs/symptoms of physical distress Continue with 30 min of aerobic exercise without signs/symptoms of physical distress. Continue with 30 min of aerobic exercise without signs/symptoms of physical distress. -- Continue with 30 min of aerobic exercise without signs/symptoms of physical distress.   Intensity THRR unchanged THRR unchanged THRR unchanged -- THRR unchanged     Progression   Progression Continue to progress workloads to maintain intensity without signs/symptoms of physical distress. Continue to progress workloads to maintain intensity without signs/symptoms of physical distress. Continue to progress workloads to maintain intensity without signs/symptoms of physical distress. -- Continue to progress workloads to maintain intensity without signs/symptoms of physical distress.   Average METs 3.1 3.03 3.32 -- 3.47     Resistance Training   Training Prescription -- Yes Yes -- Yes    Weight -- 5lb 5lb -- 5 lb   Reps -- 10-15 10-15 -- 10-15     Interval Training   Interval Training -- -- No -- No     Treadmill   MPH -- 2.6 2.6 -- 2.7   Grade -- 0 2 -- 1.5   Minutes -- 15 15 -- 15   METs -- 2.99 3.71 -- 2.63     NuStep   Level -- 4 -- -- 4   Minutes -- 15 -- -- 15   METs -- 4.3 -- -- 4.7     REL-XR   Level -- 3 4 -- 3   Minutes -- 15 15 -- 15   METs -- 3.1 3.7 -- --     T5 Nustep   Level -- 3 3 -- 3   Minutes -- 15 15 -- 15   METs -- 2.3 2.1 -- 2.5     Biostep-RELP   Level -- 2 3 -- 2   Minutes -- 15 15 -- 15   METs -- 3 -- -- 4     Home Exercise Plan   Plans to continue exercise at -- -- -- Home (comment)  stationary bike, walking at home, looking into getting a gym membership Home (comment)  stationary bike, walking at home, looking into getting a gym membership   Frequency -- -- -- Add 4 additional days to program exercise sessions. Add 4 additional days to program exercise sessions.   Initial Home Exercises Provided -- -- -- 10/12/23 10/12/23     Oxygen   Maintain Oxygen Saturation -- 88% or higher 88% or higher 88% or higher 88% or higher    Row Name 11/04/23 1100 11/16/23 1500 12/01/23 1400 12/14/23 1100       Response to Exercise   Blood Pressure (Admit) 100/60 112/66 102/58 110/60    Blood Pressure (Exit) 100/56 102/60 124/68 108/56    Heart Rate (Admit) 62 bpm 72 bpm 61 bpm 79 bpm    Heart Rate (Exercise) 100 bpm 106 bpm 85 bpm 95 bpm    Heart Rate (Exit) 70 bpm 68 bpm 60 bpm 77 bpm    Rating of Perceived Exertion (Exercise) 14 12 11 14     Symptoms none none none none    Duration Continue with 30 min of aerobic exercise without signs/symptoms of physical distress. Continue with 30 min of aerobic exercise without signs/symptoms of physical distress. Continue with 30 min of aerobic exercise without signs/symptoms of physical distress. Continue with 30 min of aerobic exercise without signs/symptoms of physical distress.    Intensity THRR  unchanged THRR unchanged THRR unchanged THRR unchanged      Progression   Progression  Continue to progress workloads to maintain intensity without signs/symptoms of physical distress. Continue to progress workloads to maintain intensity without signs/symptoms of physical distress. Continue to progress workloads to maintain intensity without signs/symptoms of physical distress. Continue to progress workloads to maintain intensity without signs/symptoms of physical distress.    Average METs 3.6 4.38 3.26 4.48      Resistance Training   Training Prescription Yes Yes Yes Yes    Weight 5 lb 5 lb 5 lb 5 lb    Reps 10-15 10-15 10-15 10-15      Interval Training   Interval Training No No No No      Treadmill   MPH 2.3 2.6 2.6 4    Grade 2.5 2.5 2 3.5    Minutes 15 15 15 15     METs 3.55 3.89 3.71 4.09      NuStep   Level 4 5 -- --    Minutes 15 15 -- --    METs 3 3.9 -- --      REL-XR   Level -- 7 -- --    Minutes -- 15 -- --    METs -- 6.1 -- --      T5 Nustep   Level -- -- 3 3    Minutes -- -- 15 15    METs -- -- 2.8 2.7      Home Exercise Plan   Plans to continue exercise at Home (comment)  stationary bike, walking at home, looking into getting a gym membership Home (comment)  stationary bike, walking at home, looking into getting a gym membership Home (comment)  stationary bike, walking at home, looking into getting a gym membership Home (comment)  stationary bike, walking at home, looking into getting a gym membership    Frequency Add 4 additional days to program exercise sessions. Add 4 additional days to program exercise sessions. Add 4 additional days to program exercise sessions. Add 4 additional days to program exercise sessions.    Initial Home Exercises Provided 10/12/23 10/12/23 10/12/23 10/12/23      Oxygen   Maintain Oxygen Saturation 88% or higher 88% or higher 88% or higher 88% or higher             Exercise Comments:   Exercise Comments     Row Name  09/09/23 1121           Exercise Comments First full day of exercise!  Patient was oriented to gym and equipment including functions, settings, policies, and procedures.  Patient's individual exercise prescription and treatment plan were reviewed.  All starting workloads were established based on the results of the 6 minute walk test done at initial orientation visit.  The plan for exercise progression was also introduced and progression will be customized based on patient's performance and goals.                Exercise Goals and Review:   Exercise Goals     Row Name 09/06/23 1552             Exercise Goals   Increase Physical Activity Yes       Intervention Provide advice, education, support and counseling about physical activity/exercise needs.;Develop an individualized exercise prescription for aerobic and resistive training based on initial evaluation findings, risk stratification, comorbidities and participant's personal goals.       Expected Outcomes Long Term: Exercising regularly at least 3-5 days a week.;Long Term: Add in home exercise to make exercise part of routine  and to increase amount of physical activity.;Short Term: Attend rehab on a regular basis to increase amount of physical activity.       Increase Strength and Stamina Yes       Intervention Provide advice, education, support and counseling about physical activity/exercise needs.;Develop an individualized exercise prescription for aerobic and resistive training based on initial evaluation findings, risk stratification, comorbidities and participant's personal goals.       Expected Outcomes Long Term: Improve cardiorespiratory fitness, muscular endurance and strength as measured by increased METs and functional capacity ( );Short Term: Perform resistance training exercises routinely during rehab and add in resistance training at home;Short Term: Increase workloads from initial exercise prescription for resistance,  speed, and METs.       Able to understand and use rate of perceived exertion (RPE) scale Yes       Intervention Provide education and explanation on how to use RPE scale       Expected Outcomes Long Term:  Able to use RPE to guide intensity level when exercising independently;Short Term: Able to use RPE daily in rehab to express subjective intensity level       Able to understand and use Dyspnea scale Yes       Intervention Provide education and explanation on how to use Dyspnea scale       Expected Outcomes Long Term: Able to use Dyspnea scale to guide intensity level when exercising independently;Short Term: Able to use Dyspnea scale daily in rehab to express subjective sense of shortness of breath during exertion       Knowledge and understanding of Target Heart Rate Range (THRR) Yes       Intervention Provide education and explanation of THRR including how the numbers were predicted and where they are located for reference       Expected Outcomes Long Term: Able to use THRR to govern intensity when exercising independently;Short Term: Able to use daily as guideline for intensity in rehab;Short Term: Able to state/look up THRR       Able to check pulse independently Yes       Intervention Review the importance of being able to check your own pulse for safety during independent exercise;Provide education and demonstration on how to check pulse in carotid and radial arteries.       Expected Outcomes Long Term: Able to check pulse independently and accurately;Short Term: Able to explain why pulse checking is important during independent exercise       Understanding of Exercise Prescription Yes       Intervention Provide education, explanation, and written materials on patient's individual exercise prescription       Expected Outcomes Short Term: Able to explain program exercise prescription;Long Term: Able to explain home exercise prescription to exercise independently                Exercise  Goals Re-Evaluation :  Exercise Goals Re-Evaluation     Row Name 09/09/23 1121 09/20/23 1459 10/07/23 1127 10/12/23 1124 10/19/23 1005     Exercise Goal Re-Evaluation   Exercise Goals Review Able to understand and use rate of perceived exertion (RPE) scale;Knowledge and understanding of Target Heart Rate Range (THRR);Able to understand and use Dyspnea scale;Understanding of Exercise Prescription Increase Physical Activity;Understanding of Exercise Prescription;Increase Strength and Stamina Increase Physical Activity;Understanding of Exercise Prescription;Increase Strength and Stamina Increase Physical Activity;Understanding of Exercise Prescription;Increase Strength and Stamina;Able to understand and use Dyspnea scale;Knowledge and understanding of Target Heart Rate Range (THRR);Able to check pulse  independently;Able to understand and use rate of perceived exertion (RPE) scale Increase Physical Activity;Increase Strength and Stamina;Understanding of Exercise Prescription   Comments Reviewed RPE  and dyspnea scale, THR and program prescription with pt today.  Pt voiced understanding and was given a copy of goals to take home. Stevyn is off to a good start in the program. He has had 4 exercise sessions so far. He has increased the treadmill to a speed of 2.6. We will continue to monitor his progress in the program. Trey Paula continues to do well in rehab. He recently increased his treadmill workload by adding 2% incline while maintaining a speed of 2.6 mph. He also improved to level 4 on the XR and level 3 on the biostep. We will continue to monitor his progress in the program. Reviewed home exercise with pt today.  Pt plans to use his stationary bike and walk at home for exercise. He also states that he lives close to a gym and may look into getting a membership. Reviewed THR, pulse, RPE, sign and symptoms, pulse oximetery and when to call 911 or MD.  Also discussed weather considerations and indoor options.  Pt  voiced understanding. Trey Paula is doing well in rehab. He has stayed consistent at level 3 on the T5 nustep and XR, level 2 on the biostep, and level 4 on the T4 nustep. He also has fluctuated his treadmill workload with a speed ranging from 2.3-2.7 mph and an incline ranging from 1.5-2.5%. We will continue to monitor his progress in the program.   Expected Outcomes Short: Use RPE daily to regulate intensity. Long: Follow program prescription in THR. Short: Continue to follow current exercise prescription and progressively increase workloads. Long: Continue exercise to improve strength and stamina. Short: Continue to progressively increase workloads. Long: Continue exercise to improve strength and stamina. Short: Add four additional day of exercise in addition to coming once a week to rehab. Long: Continue to exercise independently. Short: Try increasing to level 3 on the biostep. Long: Continue to increase overall METs and stamina.    Row Name 11/04/23 1127 11/16/23 1551 12/01/23 1424 12/14/23 1200       Exercise Goal Re-Evaluation   Exercise Goals Review Increase Physical Activity;Increase Strength and Stamina;Understanding of Exercise Prescription Increase Physical Activity;Increase Strength and Stamina;Understanding of Exercise Prescription Increase Physical Activity;Increase Strength and Stamina;Understanding of Exercise Prescription Increase Physical Activity;Increase Strength and Stamina;Understanding of Exercise Prescription    Comments Trey Paula continues to do well in rehab, although he is only coming once a week due to work. He has continued to do well on the treadmill at a speed of 2.3 mph with a 2.5% incline. He also has stayed consistent at level 4 on the T4 nustep and using 5 lb handweights for resistance training. We will continue to monitor his progress in the program. Trey Paula continues to do well in rehab, although he is only coming once a week due to work. He increased his treadmill workload to a  speed of 2.6 mph while maintaining a 2.5% incline. He also improved to level 5 on the T4 nustep and level 7 on the XR. We will continue to monitor his progress in the program. Trey Paula has only attended one session since the last review. He continues to do well on the treadmill at a speed of 2.6 mph. He also did well at level 5 on the T5 nustep and used 5 lb hand weights for resistance training as well. We will continue to monitor his progress  in the program. Trey Paula is doing well in rehab. He increased his treadmill workload for one session up to a speed of 4 mph with an incline of 3.5%. He also continues to work at level 3 on the T5 nustep and use 5 lb hand weights for resistance training. He is due for his post and will look to improve on it. We will continue to monitor his progress in the program.    Expected Outcomes Short: Continue to progressively increase workloads and exercise on days away from rehab. Long: Continue to increase overall METs and stamina. Short: Continue to progressively increase treadmill workload and exercise on days away from rehab. Long: Continue to increase overall METs and stamina. Short: Continue to progressively increase treadmill workload and exercise on days away from rehab. Long: Continue to increase overall METs and stamina. Short: Improve on post . Long: Continue exercise to improve strength and stamina.             Discharge Exercise Prescription (Final Exercise Prescription Changes):  Exercise Prescription Changes - 12/14/23 1100       Response to Exercise   Blood Pressure (Admit) 110/60    Blood Pressure (Exit) 108/56    Heart Rate (Admit) 79 bpm    Heart Rate (Exercise) 95 bpm    Heart Rate (Exit) 77 bpm    Rating of Perceived Exertion (Exercise) 14    Symptoms none    Duration Continue with 30 min of aerobic exercise without signs/symptoms of physical distress.    Intensity THRR unchanged      Progression   Progression Continue to progress  workloads to maintain intensity without signs/symptoms of physical distress.    Average METs 4.48      Resistance Training   Training Prescription Yes    Weight 5 lb    Reps 10-15      Interval Training   Interval Training No      Treadmill   MPH 4    Grade 3.5    Minutes 15    METs 5.99      T5 Nustep   Level 3    Minutes 15    METs 2.7      Home Exercise Plan   Plans to continue exercise at Home (comment)   stationary bike, walking at home, looking into getting a gym membership   Frequency Add 4 additional days to program exercise sessions.    Initial Home Exercises Provided 10/12/23      Oxygen   Maintain Oxygen Saturation 88% or higher             Nutrition:  Target Goals: Understanding of nutrition guidelines, daily intake of sodium 1500mg , cholesterol 200mg , calories 30% from fat and 7% or less from saturated fats, daily to have 5 or more servings of fruits and vegetables.  Education: All About Nutrition: -Group instruction provided by verbal, written material, interactive activities, discussions, models, and posters to present general guidelines for heart healthy nutrition including fat, fiber, MyPlate, the role of sodium in heart healthy nutrition, utilization of the nutrition label, and utilization of this knowledge for meal planning. Follow up email sent as well. Written material given at graduation.   Biometrics:  Pre Biometrics - 09/06/23 1553       Pre Biometrics   Height 5\' 5"  (1.651 m)    Weight 163 lb 8 oz (74.2 kg)    Waist Circumference 37 inches    Hip Circumference 37.5 inches    Waist  to Hip Ratio 0.99 %    BMI (Calculated) 27.21    Single Leg Stand 30 seconds              Nutrition Therapy Plan and Nutrition Goals:   Nutrition Assessments:  MEDIFICTS Score Key: >=70 Need to make dietary changes  40-70 Heart Healthy Diet <= 40 Therapeutic Level Cholesterol Diet  Flowsheet Row Cardiac Rehab from 09/12/2023 in [facility] Cardiac  and Pulmonary Rehab  Picture Your Plate Total Score on Admission 66      Picture Your Plate Scores: <96 Unhealthy dietary pattern with much room for improvement. 41-50 Dietary pattern unlikely to meet recommendations for good health and room for improvement. 51-60 More healthful dietary pattern, with some room for improvement.  >60 Healthy dietary pattern, although there may be some specific behaviors that could be improved.    Nutrition Goals Re-Evaluation:  Nutrition Goals Re-Evaluation     Row Name 11/11/23 1119             Goals   Comment Patient deferred dietitian appointment                Nutrition Goals Discharge (Final Nutrition Goals Re-Evaluation):  Nutrition Goals Re-Evaluation - 11/11/23 1119       Goals   Comment Patient deferred dietitian appointment             Psychosocial: Target Goals: Acknowledge presence or absence of significant depression and/or stress, maximize coping skills, provide positive support system. Participant is able to verbalize types and ability to use techniques and skills needed for reducing stress and depression.   Education: Stress, Anxiety, and Depression - Group verbal and visual presentation to define topics covered.  Reviews how body is impacted by stress, anxiety, and depression.  Also discusses healthy ways to reduce stress and to treat/manage anxiety and depression.  Written material given at graduation. Flowsheet Row Cardiac Rehab from 10/12/2023 in [facility] Cardiac and Pulmonary Rehab  Date 10/05/23  Educator SB  Instruction Review Code 1- [employer] Understanding       Education: Sleep Hygiene -Provides group verbal and written instruction about how sleep can affect your health.  Define sleep hygiene, discuss sleep cycles and impact of sleep habits. Review good sleep hygiene tips.    Initial Review & Psychosocial Screening:  Initial Psych Review & Screening - 08/25/23 1005       Initial Review   Current  issues with None Identified      Family Dynamics   Good Support System? Yes    Comments He can look to his wife for support. He is ready to start rehab and get into healthy lifestyle.      Barriers   Psychosocial barriers to participate in program There are no identifiable barriers or psychosocial needs.;The patient should benefit from training in stress management and relaxation.      Screening Interventions   Interventions Encouraged to exercise;To provide support and resources with identified psychosocial needs;Provide feedback about the scores to participant    Expected Outcomes Short Term goal: Utilizing psychosocial counselor, staff and physician to assist with identification of specific Stressors or current issues interfering with healing process. Setting desired goal for each stressor or current issue identified.;Long Term Goal: Stressors or current issues are controlled or eliminated.;Short Term goal: Identification and review with participant of any Quality of Life or Depression concerns found by scoring the questionnaire.;Long Term goal: The participant improves quality of Life and PHQ9 Scores as seen by post scores and/or  verbalization of changes             Quality of Life Scores:   Quality of Life - 09/12/23 1121       Quality of Life   Select Quality of Life      Quality of Life Scores   Health/Function Pre 21.2 %    Socioeconomic Pre 20.64 %    Psych/Spiritual Pre 22.07 %    Family Pre 19.17 %    GLOBAL Pre 21.06 %            Scores of 19 and below usually indicate a poorer quality of life in these areas.  A difference of  2-3 points is a clinically meaningful difference.  A difference of 2-3 points in the total score of the Quality of Life Index has been associated with significant improvement in overall quality of life, self-image, physical symptoms, and general health in studies assessing change in quality of life.  PHQ-9: Review Flowsheet  More data exists       09/06/2023 05/13/2023 05/02/2023 11/12/2022 01/22/2022  Depression screen PHQ 2/9  Decreased Interest 0 1 0 0 0  Down, Depressed, Hopeless 0 0 0 0 0  PHQ - 2 Score 0 1 0 0 0  Altered sleeping 2 1 0 - -  Tired, decreased energy 2 1 0 - -  Change in appetite 0 0 0 - -  Feeling bad or failure about yourself  0 0 0 - -  Trouble concentrating 0 0 0 - -  Moving slowly or fidgety/restless 0 0 0 - -  Suicidal thoughts 0 0 0 - -  PHQ-9 Score 4 3 0 - -  Difficult doing work/chores Somewhat difficult Not difficult at all Not difficult at all - -   Interpretation of Total Score  Total Score Depression Severity:  1-4 = Minimal depression, 5-9 = Mild depression, 10-14 = Moderate depression, 15-19 = Moderately severe depression, 20-27 = Severe depression   Psychosocial Evaluation and Intervention:  Psychosocial Evaluation - 08/25/23 1008       Psychosocial Evaluation & Interventions   Interventions Encouraged to exercise with the program and follow exercise prescription;Relaxation education;Stress management education    Comments He can look to his wife for support. He is ready to start rehab and get into healthy lifestyle.    Expected Outcomes Short: Start [facility] to help with mood. Long: Maintain a healthy mental state    Continue Psychosocial Services  Follow up required by staff             Psychosocial Re-Evaluation:  Psychosocial Re-Evaluation     Row Name 11/11/23 1118             Psychosocial Re-Evaluation   Current issues with None Identified       Comments Patient reports no issues with their current mental states, sleep, stress, depression or anxiety. Will follow up with patient in a few weeks for any changes.       Expected Outcomes Short: Continue to exercise regularly to support mental health and notify staff of any changes. Long: maintain mental health and well being through teaching of rehab or prescribed medications independently.       Interventions Encouraged  to attend Cardiac Rehabilitation for the exercise       Continue Psychosocial Services  Follow up required by staff                Psychosocial Discharge (Final Psychosocial Re-Evaluation):  Psychosocial Re-Evaluation -  11/11/23 1118       Psychosocial Re-Evaluation   Current issues with None Identified    Comments Patient reports no issues with their current mental states, sleep, stress, depression or anxiety. Will follow up with patient in a few weeks for any changes.    Expected Outcomes Short: Continue to exercise regularly to support mental health and notify staff of any changes. Long: maintain mental health and well being through teaching of rehab or prescribed medications independently.    Interventions Encouraged to attend Cardiac Rehabilitation for the exercise    Continue Psychosocial Services  Follow up required by staff             Vocational Rehabilitation: Provide vocational rehab assistance to qualifying candidates.   Vocational Rehab Evaluation & Intervention:   Education: Education Goals: Education classes will be provided on a variety of topics geared toward better understanding of heart health and risk factor modification. Participant will state understanding/return demonstration of topics presented as noted by education test scores.  Learning Barriers/Preferences:  Learning Barriers/Preferences - 08/25/23 1004       Learning Barriers/Preferences   Learning Barriers None    Learning Preferences None             General Cardiac Education Topics:  AED/CPR: - Group verbal and written instruction with the use of models to demonstrate the basic use of the AED with the basic ABC's of resuscitation.   Anatomy and Cardiac Procedures: - Group verbal and visual presentation and models provide information about basic cardiac anatomy and function. Reviews the testing methods done to diagnose heart disease and the outcomes of the test results. Describes  the treatment choices: Medical Management, Angioplasty, or Coronary Bypass Surgery for treating various heart conditions including Myocardial Infarction, Angina, Valve Disease, and Cardiac Arrhythmias.  Written material given at graduation.   Medication Safety: - Group verbal and visual instruction to review commonly prescribed medications for heart and lung disease. Reviews the medication, class of the drug, and side effects. Includes the steps to properly store meds and maintain the prescription regimen.  Written material given at graduation. Flowsheet Row Cardiac Rehab from 10/12/2023 in [facility] Cardiac and Pulmonary Rehab  Date 09/14/23  Educator SB  Instruction Review Code 1- Verbalizes Understanding       Intimacy: - Group verbal instruction through game format to discuss how heart and lung disease can affect sexual intimacy. Written material given at graduation..   Know Your Numbers and Heart Failure: - Group verbal and visual instruction to discuss disease risk factors for cardiac and pulmonary disease and treatment options.  Reviews associated critical values for Overweight/Obesity, Hypertension, Cholesterol, and Diabetes.  Discusses basics of heart failure: signs/symptoms and treatments.  Introduces Heart Failure Zone chart for action plan for heart failure.  Written material given at graduation. Flowsheet Row Cardiac Rehab from 10/12/2023 in [facility] Cardiac and Pulmonary Rehab  Date 09/28/23  Educator SB  Instruction Review Code 1- Verbalizes Understanding       Infection Prevention: - Provides verbal and written material to individual with discussion of infection control including proper hand washing and proper equipment cleaning during exercise session. Flowsheet Row Cardiac Rehab from 10/12/2023 in [facility] Cardiac and Pulmonary Rehab  Date 09/06/23  Educator mc  Instruction Review Code 1- Verbalizes Understanding       Falls Prevention: - Provides verbal and written  material to individual with discussion of falls prevention and safety. Flowsheet Row Cardiac Rehab from 10/12/2023 in [facility] Cardiac and Pulmonary  Rehab  Date 09/06/23  Educator mc  Instruction Review Code 1- Verbalizes Understanding       Other: -Provides group and verbal instruction on various topics (see comments)   Knowledge Questionnaire Score:  Knowledge Questionnaire Score - 09/12/23 1110       Knowledge Questionnaire Score   Pre Score 23             Core Components/Risk Factors/Patient Goals at Admission:  Personal Goals and Risk Factors at Admission - 09/06/23 1556       Core Components/Risk Factors/Patient Goals on Admission    Weight Management Yes;Weight Maintenance    Intervention Weight Management: Develop a combined nutrition and exercise program designed to reach desired caloric intake, while maintaining appropriate intake of nutrient and fiber, sodium and fats, and appropriate energy expenditure required for the weight goal.;Weight Management: Provide education and appropriate resources to help participant work on and attain dietary goals.;Weight Management/Obesity: Establish reasonable short term and long term weight goals.    Admit Weight 163 lb 8 oz (74.2 kg)    Goal Weight: Short Term 160 lb (72.6 kg)    Goal Weight: Long Term 155 lb (70.3 kg)    Expected Outcomes Short Term: Continue to assess and modify interventions until short term weight is achieved;Long Term: Adherence to nutrition and physical activity/exercise program aimed toward attainment of established weight goal;Weight Maintenance: Understanding of the daily nutrition guidelines, which includes 25-35% calories from fat, 7% or less cal from saturated fats, less than 200mg  cholesterol, less than 1.5gm of sodium, & 5 or more servings of fruits and vegetables daily;Understanding recommendations for meals to include 15-35% energy as protein, 25-35% energy from fat, 35-60% energy from carbohydrates, less  than 200mg  of dietary cholesterol, 20-35 gm of total fiber daily;Understanding of distribution of calorie intake throughout the day with the consumption of 4-5 meals/snacks    Hypertension Yes    Intervention Provide education on lifestyle modifcations including regular physical activity/exercise, weight management, moderate sodium restriction and increased consumption of fresh fruit, vegetables, and low fat dairy, alcohol moderation, and smoking cessation.;Monitor prescription use compliance.    Expected Outcomes Short Term: Continued assessment and intervention until BP is < 140/5mm HG in hypertensive participants. < 130/95mm HG in hypertensive participants with diabetes, heart failure or chronic kidney disease.;Long Term: Maintenance of blood pressure at goal levels.             Education:Diabetes - Individual verbal and written instruction to review signs/symptoms of diabetes, desired ranges of glucose level fasting, after meals and with exercise. Acknowledge that pre and post exercise glucose checks will be done for 3 sessions at entry of program.   Core Components/Risk Factors/Patient Goals Review:   Goals and Risk Factor Review     Row Name 11/11/23 1120             Core Components/Risk Factors/Patient Goals Review   Personal Goals Review Weight Management/Obesity       Review Trey Paula states that he wants to maintain his weight at this point. He has started working and feels pretty well health wise. He was 170lbs today and wants to maintain that weight. He has no questions about his medications at this time.       Expected Outcomes Short: continue to exercise. Long: maintain weight independently                Core Components/Risk Factors/Patient Goals at Discharge (Final Review):   Goals and Risk Factor Review - 11/11/23 1120  Core Components/Risk Factors/Patient Goals Review   Personal Goals Review Weight Management/Obesity    Review Trey Paula states that he wants to  maintain his weight at this point. He has started working and feels pretty well health wise. He was 170lbs today and wants to maintain that weight. He has no questions about his medications at this time.    Expected Outcomes Short: continue to exercise. Long: maintain weight independently             ITP Comments:  ITP Comments     Row Name 08/25/23 1003 09/06/23 1543 09/09/23 1120 09/28/23 1258 10/26/23 1238   ITP Comments Virtual Visit completed. Patient informed on EP and RD appointment and 6 Minute walk test. Patient also informed of patient health questionnaires on My Chart. Patient Verbalizes understanding. Visit diagnosis can be found in [facility] 07/13/[facility]23. Completed and gym orientation. Initial ITP created and sent for review to Dr. Cristal Ford, Medical Director. First full day of exercise!  Patient was oriented to gym and equipment including functions, settings, policies, and procedures.  Patient's individual exercise prescription and treatment plan were reviewed.  All starting workloads were established based on the results of the 6 minute walk test done at initial orientation visit.  The plan for exercise progression was also introduced and progression will be customized based on patient's performance and goals. 30 Day review completed. Medical Director ITP review done, changes made as directed, and signed approval by Medical Director.    new to program 30 Day review completed. Medical Director ITP review done, changes made as directed, and signed approval by Medical Director.    Row Name 11/16/23 1006 12/14/23 1210         ITP Comments 30 Day review completed. Medical Director ITP review done, changes made as directed, and signed approval by Medical Director. 30 Day review completed. Medical Director ITP review done, changes made as directed, and signed approval by Medical Director.               Comments:

## 2023-12-16 ENCOUNTER — Ambulatory Visit: Payer: 59 | Admitting: Physical Therapy

## 2023-12-19 NOTE — Telephone Encounter (Signed)
Pt says he is not using the CPAP and does not want a f/u with pulmonary.

## 2023-12-19 NOTE — Telephone Encounter (Signed)
Colonoscopy report requested

## 2023-12-23 ENCOUNTER — Encounter: Payer: 59 | Admitting: *Deleted

## 2023-12-23 ENCOUNTER — Other Ambulatory Visit (INDEPENDENT_AMBULATORY_CARE_PROVIDER_SITE_OTHER): Payer: 59

## 2023-12-23 DIAGNOSIS — Z125 Encounter for screening for malignant neoplasm of prostate: Secondary | ICD-10-CM | POA: Diagnosis not present

## 2023-12-23 DIAGNOSIS — E7800 Pure hypercholesterolemia, unspecified: Secondary | ICD-10-CM | POA: Diagnosis not present

## 2023-12-23 DIAGNOSIS — I10 Essential (primary) hypertension: Secondary | ICD-10-CM | POA: Diagnosis not present

## 2023-12-23 DIAGNOSIS — Z951 Presence of aortocoronary bypass graft: Secondary | ICD-10-CM | POA: Diagnosis not present

## 2023-12-23 DIAGNOSIS — R739 Hyperglycemia, unspecified: Secondary | ICD-10-CM | POA: Diagnosis not present

## 2023-12-23 LAB — LIPID PANEL
Cholesterol: 144 mg/dL (ref 0–200)
HDL: 40.3 mg/dL (ref >39.00)
LDL Cholesterol: 77 mg/dL (ref 0–99)
NonHDL: 103.22
Total CHOL/HDL Ratio: 4
Triglycerides: 133 mg/dL (ref 0.0–149.0)
VLDL: 26.6 mg/dL (ref 0.0–40.0)

## 2023-12-23 LAB — BASIC METABOLIC PANEL
BUN: 19 mg/dL (ref 6–23)
CO2: 28 meq/L (ref 19–32)
Calcium: 9.4 mg/dL (ref 8.4–10.5)
Chloride: 101 meq/L (ref 96–112)
Creatinine, Ser: 0.93 mg/dL (ref 0.40–1.50)
GFR: 87.95 mL/min (ref >60.00)
Glucose, Bld: 96 mg/dL (ref 70–99)
Potassium: 4.6 meq/L (ref 3.5–5.1)
Sodium: 138 meq/L (ref 135–145)

## 2023-12-23 LAB — HEPATIC FUNCTION PANEL
ALT: 20 U/L (ref 0–53)
AST: 18 U/L (ref 0–37)
Albumin: 4.2 g/dL (ref 3.5–5.2)
Alkaline Phosphatase: 93 U/L (ref 39–117)
Bilirubin, Direct: 0.1 mg/dL (ref 0.0–0.3)
Total Bilirubin: 0.6 mg/dL (ref 0.2–1.2)
Total Protein: 6.7 g/dL (ref 6.0–8.3)

## 2023-12-23 LAB — PSA: PSA: 2.46 ng/mL (ref 0.10–4.00)

## 2023-12-23 LAB — HEMOGLOBIN A1C: Hgb A1c MFr Bld: 6.7 % — ABNORMAL HIGH (ref 4.6–6.5)

## 2023-12-23 NOTE — Progress Notes (Signed)
Daily Session Note  Patient Details  Name: Thomas Harper MRN: [id number] Date of Birth: 1961/07/20 Referring Provider:   Flowsheet Row Cardiac Rehab from 09/06/2023 in [facility] Cardiac and Pulmonary Rehab  Referring Provider Dr. Marikay Alar       Encounter Date: 12/23/2023  Check In:  Session Check In - 12/23/23 1112       Check-In   Supervising physician immediately available to respond to emergencies See telemetry face sheet for immediately available ER MD    Location [facility]-Cardiac & Pulmonary Rehab    Staff Present Cora Collum, RN, BSN, CCRP;Joseph [place], [place]    Virtual Visit No    Medication changes reported     No    Fall or balance concerns reported    No    Warm-up and Cool-down Performed on first and last piece of equipment    Resistance Training Performed Yes    VAD Patient? No    PAD/SET Patient? No      Pain Assessment   Currently in Pain? No/denies                Social History   Tobacco Use  Smoking Status Former   Current packs/day: 0.00   Types: Cigarettes   Quit date: 05/21/2021   Years since quitting: 2.5  Smokeless Tobacco Former    Goals Met:  Independence with exercise equipment Exercise tolerated well No report of concerns or symptoms today  Goals Unmet:  Not Applicable  Comments: Pt able to follow exercise prescription today without complaint.  Will continue to monitor for progression.    Dr. Bethann Punches is Medical Director for [facility] Cardiac Rehabilitation.  Dr. Vida Rigger is Medical Director for [facility] Pulmonary Rehabilitation.

## 2024-01-11 DIAGNOSIS — Z951 Presence of aortocoronary bypass graft: Secondary | ICD-10-CM

## 2024-01-11 NOTE — Progress Notes (Signed)
 Cardiac Individual Treatment Plan  Patient Details  Name: Thomas Harper MRN: [id number] Date of Birth: 08/02/1961 Referring Provider:   Flowsheet Row Cardiac Rehab from 09/06/2023 in [facility] Cardiac and Pulmonary Rehab  Referring Provider Dr. Nelly Banco       Initial Encounter Date:  Flowsheet Row Cardiac Rehab from 09/06/2023 in [facility] Cardiac and Pulmonary Rehab  Date 09/06/23       Visit Diagnosis: S/P CABG x 3  Patient's Home Medications on Admission:  Current Outpatient Medications:    acetaminophen  (TYLENOL ) 500 MG tablet, Take 1,000 mg by mouth every 8 (eight) hours as needed for headache., Disp: , Rfl:    albuterol  (VENTOLIN  HFA) 108 (90 Base) MCG/ACT inhaler, Inhale 2 puffs into the lungs every 6 (six) hours as needed for wheezing or shortness of breath., Disp: 8 g, Rfl: 6   aspirin  EC 81 MG tablet, Take 1 tablet (81 mg total) by mouth daily. Swallow whole., Disp: 90 tablet, Rfl: 3   atorvastatin  (LIPITOR) 80 MG tablet, Take 1 tablet (80 mg total) by mouth daily., Disp: 30 tablet, Rfl: 11   calcium  carbonate (TUMS - DOSED IN MG ELEMENTAL CALCIUM ) 500 MG chewable tablet, Chew 1 tablet by mouth continuous as needed for indigestion or heartburn., Disp: , Rfl:    clopidogrel  (PLAVIX ) 75 MG tablet, Take 1 tablet (75 mg total) by mouth daily., Disp: 90 tablet, Rfl: 3   lansoprazole (PREVACID) 15 MG capsule, Take 15 mg by mouth daily in the afternoon., Disp: , Rfl:    metoprolol  tartrate (LOPRESSOR ) 50 MG tablet, Take 1 tablet (50 mg total) by mouth 2 (two) times daily., Disp: 180 tablet, Rfl: 3   pantoprazole  (PROTONIX ) 40 MG tablet, TAKE ONE TABLET (40 MG TOTAL) BY MOUTH DAILY. TAKE 30 MINUTES BEFORE YOUR EVENING MEAL, Disp: 90 tablet, Rfl: 3   Tiotropium Bromide-Olodaterol (STIOLTO RESPIMAT ) 2.5-2.5 MCG/ACT AERS, Inhale 2 puffs into the lungs daily., Disp: , Rfl:    traMADol  (ULTRAM ) 50 MG tablet, Take 1 tablet (50 mg total) by mouth every 6 (six) hours as needed., Disp: 15  tablet, Rfl: 0  Past Medical History: Past Medical History:  Diagnosis Date   Anginal pain (HCC)    Chronic leg pain    s/p injury right leg (age 71)   Coronary artery disease    GERD (gastroesophageal reflux disease)    History of ankle fracture    persistent pain   History of kidney stones    Hypercholesterolemia    Hypertension    Nephrolithiasis    followed by [facility]   Vitamin D  deficiency     Tobacco Use: Social History   Tobacco Use  Smoking Status Former   Current packs/day: 0.00   Types: Cigarettes   Quit date: 05/21/2021   Years since quitting: 2.6  Smokeless Tobacco Former    Labs: Review Flowsheet  More data exists      Latest Ref Rng & Units 03/18/2023 07/11/2023 07/13/2023 07/29/2023 12/23/2023  Labs for ITP Cardiac and Pulmonary Rehab  Cholestrol 0 - 200 mg/dL 409  - - 811  914   LDL (calc) 0 - 99 mg/dL 782  - - 72  77   HDL-C >39.00 mg/dL 95.62  - - 13.08  65.78   Trlycerides 0.0 - 149.0 mg/dL 469.6  - - 295.2  841.3   Hemoglobin A1c 4.6 - 6.5 % 6.2  5.9  - - 6.7   PH, Arterial 7.35 - 7.45 - 7.42  7.355  7.358  7.338  7.444  7.362  - -  PCO2 arterial 32 - 48 mmHg - 38  37.6  40.9  45.1  32.1  44.0  - -  Bicarbonate 20.0 - 28.0 mmol/L - 24.6  21.1  23.2  24.2  22.0  25.0  - -  TCO2 22 - 32 mmol/L - - 22  25  24  26  26  27  24  23  24  27  26   - -  Acid-base deficit 0.0 - 2.0 mmol/L - - 4.0  2.0  2.0  2.0  1.0  - -  O2 Saturation % - 99  99  99  99  100  100  - -    Details       Multiple values from one day are sorted in reverse-chronological order          Exercise Target Goals: Exercise Program Goal: Individual exercise prescription set using results from initial 6 min walk test and THRR while considering  patient's activity barriers and safety.   Exercise Prescription Goal: Initial exercise prescription builds to 30-45 minutes a day of aerobic activity, 2-3 days per week.  Home exercise guidelines will be given to patient during program  as part of exercise prescription that the participant will acknowledge.   Education: Aerobic Exercise: - Group verbal and visual presentation on the components of exercise prescription. Introduces F.I.T.T principle from ACSM for exercise prescriptions.  Reviews F.I.T.T. principles of aerobic exercise including progression. Written material given at graduation.   Education: Resistance Exercise: - Group verbal and visual presentation on the components of exercise prescription. Introduces F.I.T.T principle from ACSM for exercise prescriptions  Reviews F.I.T.T. principles of resistance exercise including progression. Written material given at graduation.    Education: Exercise & Equipment Safety: - Individual verbal instruction and demonstration of equipment use and safety with use of the equipment. Flowsheet Row Cardiac Rehab from 10/12/2023 in [facility] Cardiac and Pulmonary Rehab  Date 09/06/23  Educator mc  Instruction Review Code 1- Verbalizes Understanding       Education: Exercise Physiology & General Exercise Guidelines: - Group verbal and written instruction with models to review the exercise physiology of the cardiovascular system and associated critical values. Provides general exercise guidelines with specific guidelines to those with heart or lung disease.  Flowsheet Row Cardiac Rehab from 10/12/2023 in [facility] Cardiac and Pulmonary Rehab  Date 10/12/23  Educator NT  Instruction Review Code 1- [employer] Understanding       Education: Flexibility, Balance, Mind/Body Relaxation: - Group verbal and visual presentation with interactive activity on the components of exercise prescription. Introduces F.I.T.T principle from ACSM for exercise prescriptions. Reviews F.I.T.T. principles of flexibility and balance exercise training including progression. Also discusses the mind body connection.  Reviews various relaxation techniques to help reduce and manage stress (i.e. Deep breathing,  progressive muscle relaxation, and visualization). Balance handout provided to take home. Written material given at graduation.   Activity Barriers & Risk Stratification:  Activity Barriers & Cardiac Risk Stratification - 09/06/23 1547       Activity Barriers & Cardiac Risk Stratification   Activity Barriers None    Cardiac Risk Stratification High             6 Minute Walk:  6 Minute Walk     Row Name 09/06/23 1545         6 Minute Walk   Phase Initial     Distance 1200 feet  Walk Time 6 minutes     # of Rest Breaks 0     MPH 2.3     METS 3.1     RPE 11     Perceived Dyspnea  0     VO2 Peak 10.7     Symptoms No     Resting HR 72 bpm     Resting BP 106/64     Resting Oxygen Saturation  96 %     Exercise Oxygen Saturation  during 6 min walk 98 %     Max Ex. HR 107 bpm     Max Ex. BP 114/70     2 Minute Post BP 110/68              Oxygen Initial Assessment:   Oxygen Re-Evaluation:   Oxygen Discharge (Final Oxygen Re-Evaluation):   Initial Exercise Prescription:  Initial Exercise Prescription - 09/06/23 1500       Date of Initial Exercise RX and Referring Provider   Date 09/06/23    Referring Provider Dr. Nelly Banco      Oxygen   Maintain Oxygen Saturation 88% or higher      Treadmill   MPH 2.3    Grade 0    Minutes 15    METs 2.76      NuStep   Level 2    Minutes 15    METs 3.1      REL-XR   Level 2    Minutes 15    METs 3.1      T5 Nustep   Level 2    Minutes 15    METs 3.1      Biostep-RELP   Level 2    Minutes 15    METs 3.1      Prescription Details   Duration Progress to 30 minutes of continuous aerobic without signs/symptoms of physical distress      Intensity   THRR 40-80% of Max Heartrate 106-140    Ratings of Perceived Exertion 11-13    Perceived Dyspnea 0-4      Progression   Progression Continue to progress workloads to maintain intensity without signs/symptoms of physical distress.       Resistance Training   Training Prescription Yes    Weight 5lb    Reps 10-15             Perform Capillary Blood Glucose checks as needed.  Exercise Prescription Changes:   Exercise Prescription Changes     Row Name 09/06/23 1500 09/20/23 1400 10/07/23 1100 10/12/23 1100 10/19/23 1000     Response to Exercise   Blood Pressure (Admit) 106/64 102/58 128/72 -- 112/64   Blood Pressure (Exercise) 114/70 142/80 132/62 -- 178/82   Blood Pressure (Exit) 110/68 96/64 110/66 -- 104/60   Heart Rate (Admit) 72 bpm 86 bpm 74 bpm -- 69 bpm   Heart Rate (Exercise) 107 bpm 103 bpm 96 bpm -- 97 bpm   Heart Rate (Exit) 73 bpm 77 bpm 74 bpm -- 72 bpm   Oxygen Saturation (Admit) 96 % -- -- -- --   Oxygen Saturation (Exercise) 98 % -- -- -- --   Oxygen Saturation (Exit) 98 % -- -- -- --   Rating of Perceived Exertion (Exercise) 11 14 14  -- 13   Perceived Dyspnea (Exercise) 0 -- -- -- --   Symptoms none -- none -- none   Comments Orientation -- -- -- --   Duration Progress to 30 minutes of  aerobic without signs/symptoms of physical distress Continue with 30 min of aerobic exercise without signs/symptoms of physical distress. Continue with 30 min of aerobic exercise without signs/symptoms of physical distress. -- Continue with 30 min of aerobic exercise without signs/symptoms of physical distress.   Intensity THRR unchanged THRR unchanged THRR unchanged -- THRR unchanged     Progression   Progression Continue to progress workloads to maintain intensity without signs/symptoms of physical distress. Continue to progress workloads to maintain intensity without signs/symptoms of physical distress. Continue to progress workloads to maintain intensity without signs/symptoms of physical distress. -- Continue to progress workloads to maintain intensity without signs/symptoms of physical distress.   Average METs 3.1 3.03 3.32 -- 3.47     Resistance Training   Training Prescription -- Yes Yes -- Yes    Weight -- 5lb 5lb -- 5 lb   Reps -- 10-15 10-15 -- 10-15     Interval Training   Interval Training -- -- No -- No     Treadmill   MPH -- 2.6 2.6 -- 2.7   Grade -- 0 2 -- 1.5   Minutes -- 15 15 -- 15   METs -- 2.99 3.71 -- 2.63     NuStep   Level -- 4 -- -- 4   Minutes -- 15 -- -- 15   METs -- 4.3 -- -- 4.7     REL-XR   Level -- 3 4 -- 3   Minutes -- 15 15 -- 15   METs -- 3.1 3.7 -- --     T5 Nustep   Level -- 3 3 -- 3   Minutes -- 15 15 -- 15   METs -- 2.3 2.1 -- 2.5     Biostep-RELP   Level -- 2 3 -- 2   Minutes -- 15 15 -- 15   METs -- 3 -- -- 4     Home Exercise Plan   Plans to continue exercise at -- -- -- Home (comment)  stationary bike, walking at home, looking into getting a gym membership Home (comment)  stationary bike, walking at home, looking into getting a gym membership   Frequency -- -- -- Add 4 additional days to program exercise sessions. Add 4 additional days to program exercise sessions.   Initial Home Exercises Provided -- -- -- 10/12/23 10/12/23     Oxygen   Maintain Oxygen Saturation -- 88% or higher 88% or higher 88% or higher 88% or higher    Row Name 11/04/23 1100 11/16/23 1500 12/01/23 1400 12/14/23 1100 12/27/23 1500     Response to Exercise   Blood Pressure (Admit) 100/60 112/66 102/58 110/60 112/64   Blood Pressure (Exit) 100/56 102/60 124/68 108/56 102/60   Heart Rate (Admit) 62 bpm 72 bpm 61 bpm 79 bpm 90 bpm   Heart Rate (Exercise) 100 bpm 106 bpm 85 bpm 95 bpm 97 bpm   Heart Rate (Exit) 70 bpm 68 bpm 60 bpm 77 bpm 64 bpm   Rating of Perceived Exertion (Exercise) 14 12 11 14 11    Symptoms none none none none none   Duration Continue with 30 min of aerobic exercise without signs/symptoms of physical distress. Continue with 30 min of aerobic exercise without signs/symptoms of physical distress. Continue with 30 min of aerobic exercise without signs/symptoms of physical distress. Continue with 30 min of aerobic exercise without  signs/symptoms of physical distress. Continue with 30 min of aerobic exercise without signs/symptoms of physical distress.   Intensity THRR unchanged  THRR unchanged THRR unchanged THRR unchanged THRR unchanged     Progression   Progression Continue to progress workloads to maintain intensity without signs/symptoms of physical distress. Continue to progress workloads to maintain intensity without signs/symptoms of physical distress. Continue to progress workloads to maintain intensity without signs/symptoms of physical distress. Continue to progress workloads to maintain intensity without signs/symptoms of physical distress. Continue to progress workloads to maintain intensity without signs/symptoms of physical distress.   Average METs 3.6 4.38 3.26 4.48 2.95     Resistance Training   Training Prescription Yes Yes Yes Yes Yes   Weight 5 lb 5 lb 5 lb 5 lb 5 lb   Reps 10-15 10-15 10-15 10-15 10-15     Interval Training   Interval Training No No No No No     Treadmill   MPH 2.3 2.6 2.6 4 2.7   Grade 2.5 2.5 2 3.5 2.5   Minutes 15 15 15 15 15    METs 3.55 3.89 3.71 5.99 4     NuStep   Level 4 5 -- -- 2   Minutes 15 15 -- -- 15   METs 3 3.9 -- -- 1.9     REL-XR   Level -- 7 -- -- --   Minutes -- 15 -- -- --   METs -- 6.1 -- -- --     T5 Nustep   Level -- -- 3 3 --   Minutes -- -- 15 15 --   METs -- -- 2.8 2.7 --     Home Exercise Plan   Plans to continue exercise at Home (comment)  stationary bike, walking at home, looking into getting a gym membership Home (comment)  stationary bike, walking at home, looking into getting a gym membership Home (comment)  stationary bike, walking at home, looking into getting a gym membership Home (comment)  stationary bike, walking at home, looking into getting a gym membership Home (comment)  stationary bike, walking at home, looking into getting a gym membership   Frequency Add 4 additional days to program exercise sessions. Add 4 additional days to  program exercise sessions. Add 4 additional days to program exercise sessions. Add 4 additional days to program exercise sessions. Add 4 additional days to program exercise sessions.   Initial Home Exercises Provided 10/12/23 10/12/23 10/12/23 10/12/23 10/12/23     Oxygen   Maintain Oxygen Saturation 88% or higher 88% or higher 88% or higher 88% or higher 88% or higher            Exercise Comments:   Exercise Comments     Row Name 09/09/23 1121           Exercise Comments First full day of exercise!  Patient was oriented to gym and equipment including functions, settings, policies, and procedures.  Patient's individual exercise prescription and treatment plan were reviewed.  All starting workloads were established based on the results of the 6 minute walk test done at initial orientation visit.  The plan for exercise progression was also introduced and progression will be customized based on patient's performance and goals.                Exercise Goals and Review:   Exercise Goals     Row Name 09/06/23 1552             Exercise Goals   Increase Physical Activity Yes       Intervention Provide advice, education, support and counseling about physical activity/exercise needs.;Develop  an individualized exercise prescription for aerobic and resistive training based on initial evaluation findings, risk stratification, comorbidities and participant's personal goals.       Expected Outcomes Long Term: Exercising regularly at least 3-5 days a week.;Long Term: Add in home exercise to make exercise part of routine and to increase amount of physical activity.;Short Term: Attend rehab on a regular basis to increase amount of physical activity.       Increase Strength and Stamina Yes       Intervention Provide advice, education, support and counseling about physical activity/exercise needs.;Develop an individualized exercise prescription for aerobic and resistive training based on  initial evaluation findings, risk stratification, comorbidities and participant's personal goals.       Expected Outcomes Long Term: Improve cardiorespiratory fitness, muscular endurance and strength as measured by increased METs and functional capacity ( );Short Term: Perform resistance training exercises routinely during rehab and add in resistance training at home;Short Term: Increase workloads from initial exercise prescription for resistance, speed, and METs.       Able to understand and use rate of perceived exertion (RPE) scale Yes       Intervention Provide education and explanation on how to use RPE scale       Expected Outcomes Long Term:  Able to use RPE to guide intensity level when exercising independently;Short Term: Able to use RPE daily in rehab to express subjective intensity level       Able to understand and use Dyspnea scale Yes       Intervention Provide education and explanation on how to use Dyspnea scale       Expected Outcomes Long Term: Able to use Dyspnea scale to guide intensity level when exercising independently;Short Term: Able to use Dyspnea scale daily in rehab to express subjective sense of shortness of breath during exertion       Knowledge and understanding of Target Heart Rate Range (THRR) Yes       Intervention Provide education and explanation of THRR including how the numbers were predicted and where they are located for reference       Expected Outcomes Long Term: Able to use THRR to govern intensity when exercising independently;Short Term: Able to use daily as guideline for intensity in rehab;Short Term: Able to state/look up THRR       Able to check pulse independently Yes       Intervention Review the importance of being able to check your own pulse for safety during independent exercise;Provide education and demonstration on how to check pulse in carotid and radial arteries.       Expected Outcomes Long Term: Able to check pulse independently and  accurately;Short Term: Able to explain why pulse checking is important during independent exercise       Understanding of Exercise Prescription Yes       Intervention Provide education, explanation, and written materials on patient's individual exercise prescription       Expected Outcomes Short Term: Able to explain program exercise prescription;Long Term: Able to explain home exercise prescription to exercise independently                Exercise Goals Re-Evaluation :  Exercise Goals Re-Evaluation     Row Name 09/09/23 1121 09/20/23 1459 10/07/23 1127 10/12/23 1124 10/19/23 1005     Exercise Goal Re-Evaluation   Exercise Goals Review Able to understand and use rate of perceived exertion (RPE) scale;Knowledge and understanding of Target Heart Rate Range (THRR);Able to understand and  use Dyspnea scale;Understanding of Exercise Prescription Increase Physical Activity;Understanding of Exercise Prescription;Increase Strength and Stamina Increase Physical Activity;Understanding of Exercise Prescription;Increase Strength and Stamina Increase Physical Activity;Understanding of Exercise Prescription;Increase Strength and Stamina;Able to understand and use Dyspnea scale;Knowledge and understanding of Target Heart Rate Range (THRR);Able to check pulse independently;Able to understand and use rate of perceived exertion (RPE) scale Increase Physical Activity;Increase Strength and Stamina;Understanding of Exercise Prescription   Comments Reviewed RPE  and dyspnea scale, THR and program prescription with pt today.  Pt voiced understanding and was given a copy of goals to take home. Thomas Harper is off to a good start in the program. He has had 4 exercise sessions so far. He has increased the treadmill to a speed of 2.6. We will continue to monitor his progress in the program. Thomas Harper continues to do well in rehab. He recently increased his treadmill workload by adding 2% incline while maintaining a speed of 2.6 mph. He  also improved to level 4 on the XR and level 3 on the biostep. We will continue to monitor his progress in the program. Reviewed home exercise with pt today.  Pt plans to use his stationary bike and walk at home for exercise. He also states that he lives close to a gym and may look into getting a membership. Reviewed THR, pulse, RPE, sign and symptoms, pulse oximetery and when to call 911 or MD.  Also discussed weather considerations and indoor options.  Pt voiced understanding. Thomas Harper is doing well in rehab. He has stayed consistent at level 3 on the T5 nustep and XR, level 2 on the biostep, and level 4 on the T4 nustep. He also has fluctuated his treadmill workload with a speed ranging from 2.3-2.7 mph and an incline ranging from 1.5-2.5%. We will continue to monitor his progress in the program.   Expected Outcomes Short: Use RPE daily to regulate intensity. Long: Follow program prescription in THR. Short: Continue to follow current exercise prescription and progressively increase workloads. Long: Continue exercise to improve strength and stamina. Short: Continue to progressively increase workloads. Long: Continue exercise to improve strength and stamina. Short: Add four additional day of exercise in addition to coming once a week to rehab. Long: Continue to exercise independently. Short: Try increasing to level 3 on the biostep. Long: Continue to increase overall METs and stamina.    Row Name 11/04/23 1127 11/16/23 1551 12/01/23 1424 12/14/23 1200 12/27/23 1521     Exercise Goal Re-Evaluation   Exercise Goals Review Increase Physical Activity;Increase Strength and Stamina;Understanding of Exercise Prescription Increase Physical Activity;Increase Strength and Stamina;Understanding of Exercise Prescription Increase Physical Activity;Increase Strength and Stamina;Understanding of Exercise Prescription Increase Physical Activity;Increase Strength and Stamina;Understanding of Exercise Prescription Increase  Physical Activity;Increase Strength and Stamina;Understanding of Exercise Prescription   Comments Thomas Harper continues to do well in rehab, although he is only coming once a week due to work. He has continued to do well on the treadmill at a speed of 2.3 mph with a 2.5% incline. He also has stayed consistent at level 4 on the T4 nustep and using 5 lb handweights for resistance training. We will continue to monitor his progress in the program. Thomas Harper continues to do well in rehab, although he is only coming once a week due to work. He increased his treadmill workload to a speed of 2.6 mph while maintaining a 2.5% incline. He also improved to level 5 on the T4 nustep and level 7 on the XR. We will continue to monitor  his progress in the program. Thomas Harper has only attended one session since the last review. He continues to do well on the treadmill at a speed of 2.6 mph. He also did well at level 5 on the T5 nustep and used 5 lb hand weights for resistance training as well. We will continue to monitor his progress in the program. Thomas Harper is doing well in rehab. He increased his treadmill workload for one session up to a speed of 4 mph with an incline of 3.5%. He also continues to work at level 3 on the T5 nustep and use 5 lb hand weights for resistance training. He is due for his post and will look to improve on it. We will continue to monitor his progress in the program. Thomas Harper is doing well in rehab, however, he has only attended rehab once since the last review. His treadmill workload decreased back down to a speed of 2.7 mph and an incline of 2.5%. He also saw a decrease in his workload on the T4 nustep down to level 2. He is due for his post as well and will look to improve on it. We will continue to monitor his progress in the program.   Expected Outcomes Short: Continue to progressively increase workloads and exercise on days away from rehab. Long: Continue to increase overall METs and stamina. Short: Continue to  progressively increase treadmill workload and exercise on days away from rehab. Long: Continue to increase overall METs and stamina. Short: Continue to progressively increase treadmill workload and exercise on days away from rehab. Long: Continue to increase overall METs and stamina. Short: Improve on post . Long: Continue exercise to improve strength and stamina. Short: Increase workloads back up to previous levels, improve on post . Long: Continue exercise to improve strength and stamina.            Discharge Exercise Prescription (Final Exercise Prescription Changes):  Exercise Prescription Changes - 12/27/23 1500       Response to Exercise   Blood Pressure (Admit) 112/64    Blood Pressure (Exit) 102/60    Heart Rate (Admit) 90 bpm    Heart Rate (Exercise) 97 bpm    Heart Rate (Exit) 64 bpm    Rating of Perceived Exertion (Exercise) 11    Symptoms none    Duration Continue with 30 min of aerobic exercise without signs/symptoms of physical distress.    Intensity THRR unchanged      Progression   Progression Continue to progress workloads to maintain intensity without signs/symptoms of physical distress.    Average METs 2.95      Resistance Training   Training Prescription Yes    Weight 5 lb    Reps 10-15      Interval Training   Interval Training No      Treadmill   MPH 2.7    Grade 2.5    Minutes 15    METs 4      NuStep   Level 2    Minutes 15    METs 1.9      Home Exercise Plan   Plans to continue exercise at Home (comment)   stationary bike, walking at home, looking into getting a gym membership   Frequency Add 4 additional days to program exercise sessions.    Initial Home Exercises Provided 10/12/23      Oxygen   Maintain Oxygen Saturation 88% or higher  Nutrition:  Target Goals: Understanding of nutrition guidelines, daily intake of sodium 1500mg , cholesterol 200mg , calories 30% from fat and 7% or less from saturated fats,  daily to have 5 or more servings of fruits and vegetables.  Education: All About Nutrition: -Group instruction provided by verbal, written material, interactive activities, discussions, models, and posters to present general guidelines for heart healthy nutrition including fat, fiber, MyPlate, the role of sodium in heart healthy nutrition, utilization of the nutrition label, and utilization of this knowledge for meal planning. Follow up email sent as well. Written material given at graduation.   Biometrics:  Pre Biometrics - 09/06/23 1553       Pre Biometrics   Height 5\' 5"  (1.651 m)    Weight 163 lb 8 oz (74.2 kg)    Waist Circumference 37 inches    Hip Circumference 37.5 inches    Waist to Hip Ratio 0.99 %    BMI (Calculated) 27.21    Single Leg Stand 30 seconds              Nutrition Therapy Plan and Nutrition Goals:   Nutrition Assessments:  MEDIFICTS Score Key: >=70 Need to make dietary changes  40-70 Heart Healthy Diet <= 40 Therapeutic Level Cholesterol Diet  Flowsheet Row Cardiac Rehab from 09/12/2023 in [facility] Cardiac and Pulmonary Rehab  Picture Your Plate Total Score on Admission 66      Picture Your Plate Scores: <16 Unhealthy dietary pattern with much room for improvement. 41-50 Dietary pattern unlikely to meet recommendations for good health and room for improvement. 51-60 More healthful dietary pattern, with some room for improvement.  >60 Healthy dietary pattern, although there may be some specific behaviors that could be improved.    Nutrition Goals Re-Evaluation:  Nutrition Goals Re-Evaluation     Row Name 11/11/23 1119             Goals   Comment Patient deferred dietitian appointment                Nutrition Goals Discharge (Final Nutrition Goals Re-Evaluation):  Nutrition Goals Re-Evaluation - 11/11/23 1119       Goals   Comment Patient deferred dietitian appointment             Psychosocial: Target Goals: Acknowledge  presence or absence of significant depression and/or stress, maximize coping skills, provide positive support system. Participant is able to verbalize types and ability to use techniques and skills needed for reducing stress and depression.   Education: Stress, Anxiety, and Depression - Group verbal and visual presentation to define topics covered.  Reviews how body is impacted by stress, anxiety, and depression.  Also discusses healthy ways to reduce stress and to treat/manage anxiety and depression.  Written material given at graduation. Flowsheet Row Cardiac Rehab from 10/12/2023 in [facility] Cardiac and Pulmonary Rehab  Date 10/05/23  Educator SB  Instruction Review Code 1- [employer] Understanding       Education: Sleep Hygiene -Provides group verbal and written instruction about how sleep can affect your health.  Define sleep hygiene, discuss sleep cycles and impact of sleep habits. Review good sleep hygiene tips.    Initial Review & Psychosocial Screening:  Initial Psych Review & Screening - 08/25/23 1005       Initial Review   Current issues with None Identified      Family Dynamics   Good Support System? Yes    Comments He can look to his wife for support. He is ready to  start rehab and get into healthy lifestyle.      Barriers   Psychosocial barriers to participate in program There are no identifiable barriers or psychosocial needs.;The patient should benefit from training in stress management and relaxation.      Screening Interventions   Interventions Encouraged to exercise;To provide support and resources with identified psychosocial needs;Provide feedback about the scores to participant    Expected Outcomes Short Term goal: Utilizing psychosocial counselor, staff and physician to assist with identification of specific Stressors or current issues interfering with healing process. Setting desired goal for each stressor or current issue identified.;Long Term Goal: Stressors or  current issues are controlled or eliminated.;Short Term goal: Identification and review with participant of any Quality of Life or Depression concerns found by scoring the questionnaire.;Long Term goal: The participant improves quality of Life and PHQ9 Scores as seen by post scores and/or verbalization of changes             Quality of Life Scores:   Quality of Life - 09/12/23 1121       Quality of Life   Select Quality of Life      Quality of Life Scores   Health/Function Pre 21.2 %    Socioeconomic Pre 20.64 %    Psych/Spiritual Pre 22.07 %    Family Pre 19.17 %    GLOBAL Pre 21.06 %            Scores of 19 and below usually indicate a poorer quality of life in these areas.  A difference of  2-3 points is a clinically meaningful difference.  A difference of 2-3 points in the total score of the Quality of Life Index has been associated with significant improvement in overall quality of life, self-image, physical symptoms, and general health in studies assessing change in quality of life.  PHQ-9: Review Flowsheet  More data exists      09/06/2023 05/13/2023 05/02/2023 11/12/2022 01/22/2022  Depression screen PHQ 2/9  Decreased Interest 0 1 0 0 0  Down, Depressed, Hopeless 0 0 0 0 0  PHQ - 2 Score 0 1 0 0 0  Altered sleeping 2 1 0 - -  Tired, decreased energy 2 1 0 - -  Change in appetite 0 0 0 - -  Feeling bad or failure about yourself  0 0 0 - -  Trouble concentrating 0 0 0 - -  Moving slowly or fidgety/restless 0 0 0 - -  Suicidal thoughts 0 0 0 - -  PHQ-9 Score 4 3 0 - -  Difficult doing work/chores Somewhat difficult Not difficult at all Not difficult at all - -   Interpretation of Total Score  Total Score Depression Severity:  1-4 = Minimal depression, 5-9 = Mild depression, 10-14 = Moderate depression, 15-19 = Moderately severe depression, 20-27 = Severe depression   Psychosocial Evaluation and Intervention:  Psychosocial Evaluation - 08/25/23 1008        Psychosocial Evaluation & Interventions   Interventions Encouraged to exercise with the program and follow exercise prescription;Relaxation education;Stress management education    Comments He can look to his wife for support. He is ready to start rehab and get into healthy lifestyle.    Expected Outcomes Short: Start HeartTrack to help with mood. Long: Maintain a healthy mental state    Continue Psychosocial Services  Follow up required by staff             Psychosocial Re-Evaluation:  Psychosocial Re-Evaluation  Row Name 11/11/23 1118             Psychosocial Re-Evaluation   Current issues with None Identified       Comments Patient reports no issues with their current mental states, sleep, stress, depression or anxiety. Will follow up with patient in a few weeks for any changes.       Expected Outcomes Short: Continue to exercise regularly to support mental health and notify staff of any changes. Long: maintain mental health and well being through teaching of rehab or prescribed medications independently.       Interventions Encouraged to attend Cardiac Rehabilitation for the exercise       Continue Psychosocial Services  Follow up required by staff                Psychosocial Discharge (Final Psychosocial Re-Evaluation):  Psychosocial Re-Evaluation - 11/11/23 1118       Psychosocial Re-Evaluation   Current issues with None Identified    Comments Patient reports no issues with their current mental states, sleep, stress, depression or anxiety. Will follow up with patient in a few weeks for any changes.    Expected Outcomes Short: Continue to exercise regularly to support mental health and notify staff of any changes. Long: maintain mental health and well being through teaching of rehab or prescribed medications independently.    Interventions Encouraged to attend Cardiac Rehabilitation for the exercise    Continue Psychosocial Services  Follow up required by staff              Vocational Rehabilitation: Provide vocational rehab assistance to qualifying candidates.   Vocational Rehab Evaluation & Intervention:   Education: Education Goals: Education classes will be provided on a variety of topics geared toward better understanding of heart health and risk factor modification. Participant will state understanding/return demonstration of topics presented as noted by education test scores.  Learning Barriers/Preferences:  Learning Barriers/Preferences - 08/25/23 1004       Learning Barriers/Preferences   Learning Barriers None    Learning Preferences None             General Cardiac Education Topics:  AED/CPR: - Group verbal and written instruction with the use of models to demonstrate the basic use of the AED with the basic ABC's of resuscitation.   Anatomy and Cardiac Procedures: - Group verbal and visual presentation and models provide information about basic cardiac anatomy and function. Reviews the testing methods done to diagnose heart disease and the outcomes of the test results. Describes the treatment choices: Medical Management, Angioplasty, or Coronary Bypass Surgery for treating various heart conditions including Myocardial Infarction, Angina, Valve Disease, and Cardiac Arrhythmias.  Written material given at graduation.   Medication Safety: - Group verbal and visual instruction to review commonly prescribed medications for heart and lung disease. Reviews the medication, class of the drug, and side effects. Includes the steps to properly store meds and maintain the prescription regimen.  Written material given at graduation. Flowsheet Row Cardiac Rehab from 10/12/2023 in [facility] Cardiac and Pulmonary Rehab  Date 09/14/23  Educator SB  Instruction Review Code 1- Verbalizes Understanding       Intimacy: - Group verbal instruction through game format to discuss how heart and lung disease can affect sexual intimacy. Written  material given at graduation..   Know Your Numbers and Heart Failure: - Group verbal and visual instruction to discuss disease risk factors for cardiac and pulmonary disease and treatment options.  Reviews associated critical  values for Overweight/Obesity, Hypertension, Cholesterol, and Diabetes.  Discusses basics of heart failure: signs/symptoms and treatments.  Introduces Heart Failure Zone chart for action plan for heart failure.  Written material given at graduation. Flowsheet Row Cardiac Rehab from 10/12/2023 in [facility] Cardiac and Pulmonary Rehab  Date 09/28/23  Educator SB  Instruction Review Code 1- Verbalizes Understanding       Infection Prevention: - Provides verbal and written material to individual with discussion of infection control including proper hand washing and proper equipment cleaning during exercise session. Flowsheet Row Cardiac Rehab from 10/12/2023 in [facility] Cardiac and Pulmonary Rehab  Date 09/06/23  Educator mc  Instruction Review Code 1- Verbalizes Understanding       Falls Prevention: - Provides verbal and written material to individual with discussion of falls prevention and safety. Flowsheet Row Cardiac Rehab from 10/12/2023 in [facility] Cardiac and Pulmonary Rehab  Date 09/06/23  Educator mc  Instruction Review Code 1- Verbalizes Understanding       Other: -Provides group and verbal instruction on various topics (see comments)   Knowledge Questionnaire Score:  Knowledge Questionnaire Score - 09/12/23 1110       Knowledge Questionnaire Score   Pre Score 23             Core Components/Risk Factors/Patient Goals at Admission:  Personal Goals and Risk Factors at Admission - 09/06/23 1556       Core Components/Risk Factors/Patient Goals on Admission    Weight Management Yes;Weight Maintenance    Intervention Weight Management: Develop a combined nutrition and exercise program designed to reach desired caloric intake, while maintaining  appropriate intake of nutrient and fiber, sodium and fats, and appropriate energy expenditure required for the weight goal.;Weight Management: Provide education and appropriate resources to help participant work on and attain dietary goals.;Weight Management/Obesity: Establish reasonable short term and long term weight goals.    Admit Weight 163 lb 8 oz (74.2 kg)    Goal Weight: Short Term 160 lb (72.6 kg)    Goal Weight: Long Term 155 lb (70.3 kg)    Expected Outcomes Short Term: Continue to assess and modify interventions until short term weight is achieved;Long Term: Adherence to nutrition and physical activity/exercise program aimed toward attainment of established weight goal;Weight Maintenance: Understanding of the daily nutrition guidelines, which includes 25-35% calories from fat, 7% or less cal from saturated fats, less than 200mg  cholesterol, less than 1.5gm of sodium, & 5 or more servings of fruits and vegetables daily;Understanding recommendations for meals to include 15-35% energy as protein, 25-35% energy from fat, 35-60% energy from carbohydrates, less than 200mg  of dietary cholesterol, 20-35 gm of total fiber daily;Understanding of distribution of calorie intake throughout the day with the consumption of 4-5 meals/snacks    Hypertension Yes    Intervention Provide education on lifestyle modifcations including regular physical activity/exercise, weight management, moderate sodium restriction and increased consumption of fresh fruit, vegetables, and low fat dairy, alcohol moderation, and smoking cessation.;Monitor prescription use compliance.    Expected Outcomes Short Term: Continued assessment and intervention until BP is < 140/22mm HG in hypertensive participants. < 130/22mm HG in hypertensive participants with diabetes, heart failure or chronic kidney disease.;Long Term: Maintenance of blood pressure at goal levels.             Education:Diabetes - Individual verbal and written  instruction to review signs/symptoms of diabetes, desired ranges of glucose level fasting, after meals and with exercise. Acknowledge that pre and post exercise glucose checks will be done  for 3 sessions at entry of program.   Core Components/Risk Factors/Patient Goals Review:   Goals and Risk Factor Review     Row Name 11/11/23 1120             Core Components/Risk Factors/Patient Goals Review   Personal Goals Review Weight Management/Obesity       Review Thomas Harper states that he wants to maintain his weight at this point. He has started working and feels pretty well health wise. He was 170lbs today and wants to maintain that weight. He has no questions about his medications at this time.       Expected Outcomes Short: continue to exercise. Long: maintain weight independently                Core Components/Risk Factors/Patient Goals at Discharge (Final Review):   Goals and Risk Factor Review - 11/11/23 1120       Core Components/Risk Factors/Patient Goals Review   Personal Goals Review Weight Management/Obesity    Review Thomas Harper states that he wants to maintain his weight at this point. He has started working and feels pretty well health wise. He was 170lbs today and wants to maintain that weight. He has no questions about his medications at this time.    Expected Outcomes Short: continue to exercise. Long: maintain weight independently             ITP Comments:  ITP Comments     Row Name 08/25/23 1003 09/06/23 1543 09/09/23 1120 09/28/23 1258 10/26/23 1238   ITP Comments Virtual Visit completed. Patient informed on EP and RD appointment and 6 Minute walk test. Patient also informed of patient health questionnaires on My Chart. Patient Verbalizes understanding. Visit diagnosis can be found in [facility] 07/13/2023. Completed and gym orientation. Initial ITP created and sent for review to Dr. Julien Odor, Medical Director. First full day of exercise!  Patient was oriented to gym and  equipment including functions, settings, policies, and procedures.  Patient's individual exercise prescription and treatment plan were reviewed.  All starting workloads were established based on the results of the 6 minute walk test done at initial orientation visit.  The plan for exercise progression was also introduced and progression will be customized based on patient's performance and goals. 30 Day review completed. Medical Director ITP review done, changes made as directed, and signed approval by Medical Director.    new to program 30 Day review completed. Medical Director ITP review done, changes made as directed, and signed approval by Medical Director.    Row Name 11/16/23 1006 12/14/23 1210 01/11/24 1323       ITP Comments 30 Day review completed. Medical Director ITP review done, changes made as directed, and signed approval by Medical Director. 30 Day review completed. Medical Director ITP review done, changes made as directed, and signed approval by Medical Director. 30 Day review completed. Medical Director ITP review done, changes made as directed, and signed approval by Medical Director.              Comments: 30 day review

## 2024-01-13 ENCOUNTER — Encounter: Payer: Self-pay | Attending: Family Medicine | Admitting: *Deleted

## 2024-01-13 DIAGNOSIS — Z951 Presence of aortocoronary bypass graft: Secondary | ICD-10-CM | POA: Diagnosis present

## 2024-01-13 NOTE — Progress Notes (Signed)
Daily Session Note  Patient Details  Name: Thomas Harper MRN: [id number] Date of Birth: [birth year] Referring Provider:   Flowsheet Row Cardiac Rehab from 09/06/2023 in [facility] Cardiac and Pulmonary Rehab  Referring Provider Dr. Marikay Alar       Encounter Date: 01/13/2024  Check In:  Session Check In - 01/13/24 1221       Check-In   Supervising physician immediately available to respond to emergencies See telemetry face sheet for immediately available ER MD    Location [facility]-Cardiac & Pulmonary Rehab    Staff Present Bess Kinds RN,BSN;[facility] [place], [place], Exercise Physiologist    Virtual Visit No    Medication changes reported     No    Fall or balance concerns reported    No    Warm-up and Cool-down Performed on first and last piece of equipment    Resistance Training Performed Yes    VAD Patient? No    PAD/SET Patient? No      Pain Assessment   Currently in Pain? No/denies                Social History   Tobacco Use  Smoking Status Former   Current packs/day: 0.00   Types: Cigarettes   Quit date: 05/21/2021   Years since quitting: 2.6  Smokeless Tobacco Former    Goals Met:  Independence with exercise equipment Exercise tolerated well No report of concerns or symptoms today Strength training completed today  Goals Unmet:  Not Applicable  Comments: Pt able to follow exercise prescription today without complaint.  Will continue to monitor for progression.    Dr. Bethann Punches is Medical Director for [facility] Cardiac Rehabilitation.  Dr. Vida Rigger is Medical Director for [facility] Pulmonary Rehabilitation.
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
## 2024-01-18 ENCOUNTER — Ambulatory Visit: Payer: Self-pay | Admitting: Cardiology

## 2024-01-20 ENCOUNTER — Encounter: Payer: Self-pay | Admitting: *Deleted

## 2024-01-20 DIAGNOSIS — Z951 Presence of aortocoronary bypass graft: Secondary | ICD-10-CM

## 2024-01-20 NOTE — Progress Notes (Signed)
Daily Session Note  Patient Details  Name: Thomas Harper MRN: [id number] Date of Birth: 05/17/1961 Referring Provider:   Flowsheet Row Cardiac Rehab from 09/06/2023 in [facility] Cardiac and Pulmonary Rehab  Referring Provider Dr. Marikay Alar       Encounter Date: 01/20/2024  Check In:  Session Check In - 01/20/24 1135       Check-In   Supervising physician immediately available to respond to emergencies See telemetry face sheet for immediately available ER MD    Location [facility]-Cardiac & Pulmonary Rehab    Staff Present Elige Ko RCP,RRT,BSRT;Cora Collum, RN, BSN, CCRP;Noah Tickle, BS, Exercise Physiologist    Virtual Visit No    Medication changes reported     No    Fall or balance concerns reported    No    Warm-up and Cool-down Performed on first and last piece of equipment    Resistance Training Performed Yes    VAD Patient? No    PAD/SET Patient? No      Pain Assessment   Currently in Pain? No/denies                Social History   Tobacco Use  Smoking Status Former   Current packs/day: 0.00   Types: Cigarettes   Quit date: 05/21/2021   Years since quitting: 2.6  Smokeless Tobacco Former    Goals Met:  Independence with exercise equipment Exercise tolerated well No report of concerns or symptoms today  Goals Unmet:  Not Applicable  Comments: Pt able to follow exercise prescription today without complaint.  Will continue to monitor for progression.    Dr. Bethann Punches is Medical Director for [facility] Cardiac Rehabilitation.  Dr. Vida Rigger is Medical Director for [facility] Pulmonary Rehabilitation.

## 2024-01-27 ENCOUNTER — Encounter: Payer: Self-pay | Admitting: *Deleted

## 2024-01-27 VITALS — Ht 65.0 in | Wt 168.0 lb

## 2024-01-27 DIAGNOSIS — Z951 Presence of aortocoronary bypass graft: Secondary | ICD-10-CM | POA: Diagnosis not present

## 2024-01-27 NOTE — Patient Instructions (Signed)
Discharge Patient Instructions  Patient Details  Name: Thomas Harper MRN: [id number] Date of Birth: [birth year] Referring Provider:  Dale [place], MD   Number of Visits: 13  Reason for Discharge:  Patient reached a stable level of exercise. Patient independent in their exercise. Patient has met program and personal goals.  Diagnosis:  S/P CABG x 3  Initial Exercise Prescription:  Initial Exercise Prescription - 09/06/23 1500       Date of Initial Exercise RX and Referring Provider   Date 09/06/23    Referring Provider Dr. Marikay Alar      Oxygen   Maintain Oxygen Saturation 88% or higher      Treadmill   MPH 2.3    Grade 0    Minutes 15    METs 2.76      NuStep   Level 2    Minutes 15    METs 3.1      REL-XR   Level 2    Minutes 15    METs 3.1      T5 Nustep   Level 2    Minutes 15    METs 3.1      Biostep-RELP   Level 2    Minutes 15    METs 3.1      Prescription Details   Duration Progress to 30 minutes of continuous aerobic without signs/symptoms of physical distress      Intensity   THRR 40-80% of Max Heartrate 106-140    Ratings of Perceived Exertion 11-13    Perceived Dyspnea 0-4      Progression   Progression Continue to progress workloads to maintain intensity without signs/symptoms of physical distress.      Resistance Training   Training Prescription Yes    Weight 5lb    Reps 10-15             Discharge Exercise Prescription (Final Exercise Prescription Changes):  Exercise Prescription Changes - 01/26/24 1300       Response to Exercise   Blood Pressure (Admit) 104/58    Blood Pressure (Exit) 122/64    Heart Rate (Admit) 79 bpm    Heart Rate (Exercise) 97 bpm    Heart Rate (Exit) 74 bpm    Rating of Perceived Exertion (Exercise) 12    Perceived Dyspnea (Exercise) 0    Symptoms none    Duration Continue with 30 min of aerobic exercise without signs/symptoms of physical distress.    Intensity THRR unchanged       Progression   Progression Continue to progress workloads to maintain intensity without signs/symptoms of physical distress.    Average METs 4.1      Resistance Training   Training Prescription Yes    Weight 5 lb    Reps 10-15      Interval Training   Interval Training No      Treadmill   MPH 2.7    Grade 4.5    Minutes 15    METs 4.74      NuStep   Level 6    Minutes 15    METs 8.8      REL-XR   Level 4    Minutes 15      T5 Nustep   Level 3    Minutes 15    METs 2.7      Home Exercise Plan   Plans to continue exercise at Home (comment)   stationary bike, walking at home, looking into getting a gym membership  Frequency Add 4 additional days to program exercise sessions.    Initial Home Exercises Provided 10/12/23      Oxygen   Maintain Oxygen Saturation 88% or higher             Functional Capacity:  6 Minute Walk     Row Name 09/06/23 1545 01/27/24 1117       6 Minute Walk   Phase Initial Discharge    Distance 1200 feet 1545 feet    Distance % Change -- 28.8 %    Distance Feet Change -- 345 ft    Walk Time 6 minutes 6 minutes    # of Rest Breaks 0 0    MPH 2.3 2.93    METS 3.1 3.5    RPE 11 11    Perceived Dyspnea  0 1    VO2 Peak 10.7 12.26    Symptoms No No    Resting HR 72 bpm 71 bpm    Resting BP 106/64 122/60    Resting Oxygen Saturation  96 % 94 %    Exercise Oxygen Saturation  during 6 min walk 98 % 93 %    Max Ex. HR 107 bpm 83 bpm    Max Ex. BP 114/70 126/64    2 Minute Post BP 110/68 --            Nutrition & Weight - Outcomes:  Pre Biometrics - 09/06/23 1553       Pre Biometrics   Height 5\' 5"  (1.651 m)    Weight 163 lb 8 oz (74.2 kg)    Waist Circumference 37 inches    Hip Circumference 37.5 inches    Waist to Hip Ratio 0.99 %    BMI (Calculated) 27.21    Single Leg Stand 30 seconds             Post Biometrics - 01/27/24 1119        Post  Biometrics   Height 5\' 5"  (1.651 m)    Weight 168 lb (76.2  kg)    Waist Circumference 37 inches    Hip Circumference 37 inches    Waist to Hip Ratio 1 %    BMI (Calculated) 27.96    Single Leg Stand 28.7 seconds

## 2024-01-27 NOTE — Progress Notes (Signed)
Daily Session Note  Patient Details  Name: Thomas Harper MRN: [id number] Date of Birth: 1961/11/05 Referring Provider:   Flowsheet Row Cardiac Rehab from 09/06/2023 in [facility] Cardiac and Pulmonary Rehab  Referring Provider Dr. Marikay Alar       Encounter Date: 01/27/2024  Check In:  Session Check In - 01/27/24 1220       Check-In   Supervising physician immediately available to respond to emergencies See telemetry face sheet for immediately available ER MD    Location [facility]-Cardiac & Pulmonary Rehab    Staff Present Elige Ko RCP,RRT,BSRT;Noah Tickle, BS, Exercise Physiologist;Kailan Laws, RN, BSN, CCRP    Virtual Visit No    Medication changes reported     No    Fall or balance concerns reported    No    Warm-up and Cool-down Performed on first and last piece of equipment    Resistance Training Performed Yes    VAD Patient? No    PAD/SET Patient? No      Pain Assessment   Currently in Pain? No/denies              6 Minute Walk     Row Name 09/06/23 1545 01/27/24 1117       6 Minute Walk   Phase Initial Discharge    Distance 1200 feet 1545 feet    Distance % Change -- 28.8 %    Distance Feet Change -- 345 ft    Walk Time 6 minutes 6 minutes    # of Rest Breaks 0 0    MPH 2.3 2.93    METS 3.1 3.5    RPE 11 11    Perceived Dyspnea  0 1    VO2 Peak 10.7 12.26    Symptoms No No    Resting HR 72 bpm 71 bpm    Resting BP 106/64 122/60    Resting Oxygen Saturation  96 % 94 %    Exercise Oxygen Saturation  during 6 min walk 98 % 93 %    Max Ex. HR 107 bpm 83 bpm    Max Ex. BP 114/70 126/64    2 Minute Post BP 110/68 --                Social History   Tobacco Use  Smoking Status Former   Current packs/day: 0.00   Types: Cigarettes   Quit date: 05/21/2021   Years since quitting: 2.6  Smokeless Tobacco Former    Goals Met:  Independence with exercise equipment Exercise tolerated well No report of concerns or symptoms today  Goals Unmet:   Not Applicable  Comments: Pt able to follow exercise prescription today without complaint.  Will continue to monitor for progression.    Dr. Bethann Punches is Medical Director for [facility] Cardiac Rehabilitation.  Dr. Vida Rigger is Medical Director for [facility] Pulmonary Rehabilitation.

## 2024-02-03 ENCOUNTER — Encounter: Payer: 59 | Attending: Family Medicine | Admitting: *Deleted

## 2024-02-03 DIAGNOSIS — Z951 Presence of aortocoronary bypass graft: Secondary | ICD-10-CM | POA: Diagnosis present

## 2024-02-03 NOTE — Progress Notes (Signed)
 Daily Session Note  Patient Details  Name: Thomas Harper MRN: [id number] Date of Birth: September 19, 1961 Referring Provider:   Flowsheet Row Cardiac Rehab from 09/06/2023 in [facility] Cardiac and Pulmonary Rehab  Referring Provider Dr. Camellia Her       Encounter Date: 02/03/2024  Check In:  Session Check In - 02/03/24 1134       Check-In   Supervising physician immediately available to respond to emergencies See telemetry face sheet for immediately available ER MD    Location [facility]-Cardiac & Pulmonary Rehab    Staff Present Othel Durand, RN, BSN, CCRP;Noah Tickle, BS, Exercise Physiologist;Joseph Hood RCP,RRT,BSRT    Virtual Visit No    Medication changes reported     No    Fall or balance concerns reported    No    Warm-up and Cool-down Performed on first and last piece of equipment    Resistance Training Performed Yes    VAD Patient? No    PAD/SET Patient? No      Pain Assessment   Currently in Pain? No/denies                Social History   Tobacco Use  Smoking Status Former   Current packs/day: 0.00   Types: Cigarettes   Quit date: 05/21/2021   Years since quitting: 2.7  Smokeless Tobacco Former    Goals Met:  Independence with exercise equipment Exercise tolerated well No report of concerns or symptoms today  Goals Unmet:  Not Applicable  Comments: Pt able to follow exercise prescription today without complaint.  Will continue to monitor for progression.    Dr. Oneil Pinal is Medical Director for [facility] Cardiac Rehabilitation.  Dr. Fuad Aleskerov is Medical Director for [facility] Pulmonary Rehabilitation.

## 2024-02-08 DIAGNOSIS — Z951 Presence of aortocoronary bypass graft: Secondary | ICD-10-CM

## 2024-02-08 NOTE — Progress Notes (Signed)
Cardiac Individual Treatment Plan  Patient Details  Name: Thomas Harper MRN: [id number] Date of Birth: 1961/04/08 Referring Provider:   Flowsheet Row Cardiac Rehab from 09/06/2023 in [facility] Cardiac and Pulmonary Rehab  Referring Provider Dr. Marikay Alar       Initial Encounter Date:  Flowsheet Row Cardiac Rehab from 09/06/2023 in [facility] Cardiac and Pulmonary Rehab  Date 09/06/23       Visit Diagnosis: S/P CABG x 3  Patient's Home Medications on Admission:  Current Outpatient Medications:    acetaminophen (TYLENOL) 500 MG tablet, Take 1,000 mg by mouth every 8 (eight) hours as needed for headache., Disp: , Rfl:    albuterol (VENTOLIN HFA) 108 (90 Base) MCG/ACT inhaler, Inhale 2 puffs into the lungs every 6 (six) hours as needed for wheezing or shortness of breath., Disp: 8 g, Rfl: 6   aspirin EC 81 MG tablet, Take 1 tablet (81 mg total) by mouth daily. Swallow whole., Disp: 90 tablet, Rfl: 3   atorvastatin (LIPITOR) 80 MG tablet, Take 1 tablet (80 mg total) by mouth daily., Disp: 30 tablet, Rfl: 11   calcium carbonate (TUMS - DOSED IN MG ELEMENTAL CALCIUM) 500 MG chewable tablet, Chew 1 tablet by mouth continuous as needed for indigestion or heartburn., Disp: , Rfl:    clopidogrel (PLAVIX) 75 MG tablet, Take 1 tablet (75 mg total) by mouth daily., Disp: 90 tablet, Rfl: 3   lansoprazole (PREVACID) 15 MG capsule, Take 15 mg by mouth daily in the afternoon., Disp: , Rfl:    metoprolol tartrate (LOPRESSOR) 50 MG tablet, Take 1 tablet (50 mg total) by mouth 2 (two) times daily., Disp: 180 tablet, Rfl: 3   pantoprazole (PROTONIX) 40 MG tablet, TAKE ONE TABLET (40 MG TOTAL) BY MOUTH DAILY. TAKE 30 MINUTES BEFORE YOUR EVENING MEAL, Disp: 90 tablet, Rfl: 3   Tiotropium Bromide-Olodaterol (STIOLTO RESPIMAT) 2.5-2.5 MCG/ACT AERS, Inhale 2 puffs into the lungs daily., Disp: , Rfl:    traMADol (ULTRAM) 50 MG tablet, Take 1 tablet (50 mg total) by mouth every 6 (six) hours as needed., Disp: 15  tablet, Rfl: 0  Past Medical History: Past Medical History:  Diagnosis Date   Anginal pain (HCC)    Chronic leg pain    s/p injury right leg (age 63)   Coronary artery disease    GERD (gastroesophageal reflux disease)    History of ankle fracture    persistent pain   History of kidney stones    Hypercholesterolemia    Hypertension    Nephrolithiasis    followed by Assunta Gambles   Vitamin D deficiency     Tobacco Use: Social History   Tobacco Use  Smoking Status Former   Current packs/day: 0.00   Types: Cigarettes   Quit date: 05/21/2021   Years since quitting: 2.7  Smokeless Tobacco Former    Labs: Review Flowsheet  More data exists      Latest Ref Rng & Units 03/18/2023 07/11/2023 07/13/2023 07/29/2023 12/23/2023  Labs for ITP Cardiac and Pulmonary Rehab  Cholestrol 0 - 200 mg/dL 782  - - 956  213   LDL (calc) 0 - 99 mg/dL 086  - - 72  77   HDL-C >39.00 mg/dL 57.84  - - 69.62  95.28   Trlycerides 0.0 - 149.0 mg/dL 413.2  - - 440.1  027.2   Hemoglobin A1c 4.6 - 6.5 % 6.2  5.9  - - 6.7   PH, Arterial 7.35 - 7.45 - 7.42  7.355  7.358  7.338  7.444  7.362  - -  PCO2 arterial 32 - 48 mmHg - 38  37.6  40.9  45.1  32.1  44.0  - -  Bicarbonate 20.0 - 28.0 mmol/L - 24.6  21.1  23.2  24.2  22.0  25.0  - -  TCO2 22 - 32 mmol/L - - 22  25  24  26  26  27  24  23  24  27  26   - -  Acid-base deficit 0.0 - 2.0 mmol/L - - 4.0  2.0  2.0  2.0  1.0  - -  O2 Saturation % - 99  99  99  99  100  100  - -    Details       Multiple values from one day are sorted in reverse-chronological order          Exercise Target Goals: Exercise Program Goal: Individual exercise prescription set using results from initial 6 min walk test and THRR while considering  patient's activity barriers and safety.   Exercise Prescription Goal: Initial exercise prescription builds to 30-45 minutes a day of aerobic activity, 2-3 days per week.  Home exercise guidelines will be given to patient during program  as part of exercise prescription that the participant will acknowledge.   Education: Aerobic Exercise: - Group verbal and visual presentation on the components of exercise prescription. Introduces F.I.T.T principle from ACSM for exercise prescriptions.  Reviews F.I.T.T. principles of aerobic exercise including progression. Written material given at graduation.   Education: Resistance Exercise: - Group verbal and visual presentation on the components of exercise prescription. Introduces F.I.T.T principle from ACSM for exercise prescriptions  Reviews F.I.T.T. principles of resistance exercise including progression. Written material given at graduation.    Education: Exercise & Equipment Safety: - Individual verbal instruction and demonstration of equipment use and safety with use of the equipment. Flowsheet Row Cardiac Rehab from 10/12/2023 in [facility] Cardiac and Pulmonary Rehab  Date 09/06/23  Educator mc  Instruction Review Code 1- Verbalizes Understanding       Education: Exercise Physiology & General Exercise Guidelines: - Group verbal and written instruction with models to review the exercise physiology of the cardiovascular system and associated critical values. Provides general exercise guidelines with specific guidelines to those with heart or lung disease.  Flowsheet Row Cardiac Rehab from 10/12/2023 in [facility] Cardiac and Pulmonary Rehab  Date 10/12/23  Educator NT  Instruction Review Code 1- [employer] Understanding       Education: Flexibility, Balance, Mind/Body Relaxation: - Group verbal and visual presentation with interactive activity on the components of exercise prescription. Introduces F.I.T.T principle from ACSM for exercise prescriptions. Reviews F.I.T.T. principles of flexibility and balance exercise training including progression. Also discusses the mind body connection.  Reviews various relaxation techniques to help reduce and manage stress (i.e. Deep breathing,  progressive muscle relaxation, and visualization). Balance handout provided to take home. Written material given at graduation.   Activity Barriers & Risk Stratification:  Activity Barriers & Cardiac Risk Stratification - 09/06/23 1547       Activity Barriers & Cardiac Risk Stratification   Activity Barriers None    Cardiac Risk Stratification High             6 Minute Walk:  6 Minute Walk     Row Name 09/06/23 1545 01/27/24 1117       6 Minute Walk   Phase Initial Discharge    Distance 1200 feet 1545 feet  Distance % Change -- 28.8 %    Distance Feet Change -- 345 ft    Walk Time 6 minutes 6 minutes    # of Rest Breaks 0 0    MPH 2.3 2.93    METS 3.1 3.5    RPE 11 11    Perceived Dyspnea  0 1    VO2 Peak 10.7 12.26    Symptoms No No    Resting HR 72 bpm 71 bpm    Resting BP 106/64 122/60    Resting Oxygen Saturation  96 % 94 %    Exercise Oxygen Saturation  during 6 min walk 98 % 93 %    Max Ex. HR 107 bpm 83 bpm    Max Ex. BP 114/70 126/64    2 Minute Post BP 110/68 --             Oxygen Initial Assessment:   Oxygen Re-Evaluation:   Oxygen Discharge (Final Oxygen Re-Evaluation):   Initial Exercise Prescription:  Initial Exercise Prescription - 09/06/23 1500       Date of Initial Exercise RX and Referring Provider   Date 09/06/23    Referring Provider Dr. Marikay Alar      Oxygen   Maintain Oxygen Saturation 88% or higher      Treadmill   MPH 2.3    Grade 0    Minutes 15    METs 2.76      NuStep   Level 2    Minutes 15    METs 3.1      REL-XR   Level 2    Minutes 15    METs 3.1      T5 Nustep   Level 2    Minutes 15    METs 3.1      Biostep-RELP   Level 2    Minutes 15    METs 3.1      Prescription Details   Duration Progress to 30 minutes of continuous aerobic without signs/symptoms of physical distress      Intensity   THRR 40-80% of Max Heartrate 106-140    Ratings of Perceived Exertion 11-13    Perceived  Dyspnea 0-4      Progression   Progression Continue to progress workloads to maintain intensity without signs/symptoms of physical distress.      Resistance Training   Training Prescription Yes    Weight 5lb    Reps 10-15             Perform Capillary Blood Glucose checks as needed.  Exercise Prescription Changes:   Exercise Prescription Changes     Row Name 09/06/23 1500 09/20/23 1400 10/07/23 1100 10/12/23 1100 10/19/23 1000     Response to Exercise   Blood Pressure (Admit) 106/64 102/58 128/72 -- 112/64   Blood Pressure (Exercise) 114/70 142/80 132/62 -- 178/82   Blood Pressure (Exit) 110/68 96/64 110/66 -- 104/60   Heart Rate (Admit) 72 bpm 86 bpm 74 bpm -- 69 bpm   Heart Rate (Exercise) 107 bpm 103 bpm 96 bpm -- 97 bpm   Heart Rate (Exit) 73 bpm 77 bpm 74 bpm -- 72 bpm   Oxygen Saturation (Admit) 96 % -- -- -- --   Oxygen Saturation (Exercise) 98 % -- -- -- --   Oxygen Saturation (Exit) 98 % -- -- -- --   Rating of Perceived Exertion (Exercise) 11 14 14  -- 13   Perceived Dyspnea (Exercise) 0 -- -- -- --   Symptoms  none -- none -- none   Comments Orientation -- -- -- --   Duration Progress to 30 minutes of  aerobic without signs/symptoms of physical distress Continue with 30 min of aerobic exercise without signs/symptoms of physical distress. Continue with 30 min of aerobic exercise without signs/symptoms of physical distress. -- Continue with 30 min of aerobic exercise without signs/symptoms of physical distress.   Intensity THRR unchanged THRR unchanged THRR unchanged -- THRR unchanged     Progression   Progression Continue to progress workloads to maintain intensity without signs/symptoms of physical distress. Continue to progress workloads to maintain intensity without signs/symptoms of physical distress. Continue to progress workloads to maintain intensity without signs/symptoms of physical distress. -- Continue to progress workloads to maintain intensity  without signs/symptoms of physical distress.   Average METs 3.1 3.03 3.32 -- 3.47     Resistance Training   Training Prescription -- Yes Yes -- Yes   Weight -- 5lb 5lb -- 5 lb   Reps -- 10-15 10-15 -- 10-15     Interval Training   Interval Training -- -- No -- No     Treadmill   MPH -- 2.6 2.6 -- 2.7   Grade -- 0 2 -- 1.5   Minutes -- 15 15 -- 15   METs -- 2.99 3.71 -- 2.63     NuStep   Level -- 4 -- -- 4   Minutes -- 15 -- -- 15   METs -- 4.3 -- -- 4.7     REL-XR   Level -- 3 4 -- 3   Minutes -- 15 15 -- 15   METs -- 3.1 3.7 -- --     T5 Nustep   Level -- 3 3 -- 3   Minutes -- 15 15 -- 15   METs -- 2.3 2.1 -- 2.5     Biostep-RELP   Level -- 2 3 -- 2   Minutes -- 15 15 -- 15   METs -- 3 -- -- 4     Home Exercise Plan   Plans to continue exercise at -- -- -- Home (comment)  stationary bike, walking at home, looking into getting a gym membership Home (comment)  stationary bike, walking at home, looking into getting a gym membership   Frequency -- -- -- Add 4 additional days to program exercise sessions. Add 4 additional days to program exercise sessions.   Initial Home Exercises Provided -- -- -- 10/12/23 10/12/23     Oxygen   Maintain Oxygen Saturation -- 88% or higher 88% or higher 88% or higher 88% or higher    Row Name 11/04/23 1100 11/16/23 1500 12/01/23 1400 12/14/23 1100 12/27/23 1500     Response to Exercise   Blood Pressure (Admit) 100/60 112/66 102/58 110/60 112/64   Blood Pressure (Exit) 100/56 102/60 124/68 108/56 102/60   Heart Rate (Admit) 62 bpm 72 bpm 61 bpm 79 bpm 90 bpm   Heart Rate (Exercise) 100 bpm 106 bpm 85 bpm 95 bpm 97 bpm   Heart Rate (Exit) 70 bpm 68 bpm 60 bpm 77 bpm 64 bpm   Rating of Perceived Exertion (Exercise) 14 12 11 14 11    Symptoms none none none none none   Duration Continue with 30 min of aerobic exercise without signs/symptoms of physical distress. Continue with 30 min of aerobic exercise without signs/symptoms of physical  distress. Continue with 30 min of aerobic exercise without signs/symptoms of physical distress. Continue with 30 min of aerobic  exercise without signs/symptoms of physical distress. Continue with 30 min of aerobic exercise without signs/symptoms of physical distress.   Intensity THRR unchanged THRR unchanged THRR unchanged THRR unchanged THRR unchanged     Progression   Progression Continue to progress workloads to maintain intensity without signs/symptoms of physical distress. Continue to progress workloads to maintain intensity without signs/symptoms of physical distress. Continue to progress workloads to maintain intensity without signs/symptoms of physical distress. Continue to progress workloads to maintain intensity without signs/symptoms of physical distress. Continue to progress workloads to maintain intensity without signs/symptoms of physical distress.   Average METs 3.6 4.38 3.26 4.48 2.95     Resistance Training   Training Prescription Yes Yes Yes Yes Yes   Weight 5 lb 5 lb 5 lb 5 lb 5 lb   Reps 10-15 10-15 10-15 10-15 10-15     Interval Training   Interval Training No No No No No     Treadmill   MPH 2.3 2.6 2.6 4 2.7   Grade 2.5 2.5 2 3.5 2.5   Minutes 15 15 15 15 15    METs 3.55 3.89 3.71 5.99 4     NuStep   Level 4 5 -- -- 2   Minutes 15 15 -- -- 15   METs 3 3.9 -- -- 1.9     REL-XR   Level -- 7 -- -- --   Minutes -- 15 -- -- --   METs -- 6.1 -- -- --     T5 Nustep   Level -- -- 3 3 --   Minutes -- -- 15 15 --   METs -- -- 2.8 2.7 --     Home Exercise Plan   Plans to continue exercise at Home (comment)  stationary bike, walking at home, looking into getting a gym membership Home (comment)  stationary bike, walking at home, looking into getting a gym membership Home (comment)  stationary bike, walking at home, looking into getting a gym membership Home (comment)  stationary bike, walking at home, looking into getting a gym membership Home (comment)  stationary bike,  walking at home, looking into getting a gym membership   Frequency Add 4 additional days to program exercise sessions. Add 4 additional days to program exercise sessions. Add 4 additional days to program exercise sessions. Add 4 additional days to program exercise sessions. Add 4 additional days to program exercise sessions.   Initial Home Exercises Provided 10/12/23 10/12/23 10/12/23 10/12/23 10/12/23     Oxygen   Maintain Oxygen Saturation 88% or higher 88% or higher 88% or higher 88% or higher 88% or higher    Row Name 01/26/24 1300             Response to Exercise   Blood Pressure (Admit) 104/58       Blood Pressure (Exit) 122/64       Heart Rate (Admit) 79 bpm       Heart Rate (Exercise) 97 bpm       Heart Rate (Exit) 74 bpm       Rating of Perceived Exertion (Exercise) 12       Perceived Dyspnea (Exercise) 0       Symptoms none       Duration Continue with 30 min of aerobic exercise without signs/symptoms of physical distress.       Intensity THRR unchanged         Progression   Progression Continue to progress workloads to maintain intensity without signs/symptoms of physical  distress.       Average METs 4.1         Resistance Training   Training Prescription Yes       Weight 5 lb       Reps 10-15         Interval Training   Interval Training No         Treadmill   MPH 2.7       Grade 4.5       Minutes 15       METs 4.74         NuStep   Level 6       Minutes 15       METs 8.8         REL-XR   Level 4       Minutes 15         T5 Nustep   Level 3       Minutes 15       METs 2.7         Home Exercise Plan   Plans to continue exercise at Home (comment)  stationary bike, walking at home, looking into getting a gym membership       Frequency Add 4 additional days to program exercise sessions.       Initial Home Exercises Provided 10/12/23         Oxygen   Maintain Oxygen Saturation 88% or higher                Exercise Comments:   Exercise  Comments     Row Name 09/09/23 1121           Exercise Comments First full day of exercise!  Patient was oriented to gym and equipment including functions, settings, policies, and procedures.  Patient's individual exercise prescription and treatment plan were reviewed.  All starting workloads were established based on the results of the 6 minute walk test done at initial orientation visit.  The plan for exercise progression was also introduced and progression will be customized based on patient's performance and goals.                Exercise Goals and Review:   Exercise Goals     Row Name 09/06/23 1552             Exercise Goals   Increase Physical Activity Yes       Intervention Provide advice, education, support and counseling about physical activity/exercise needs.;Develop an individualized exercise prescription for aerobic and resistive training based on initial evaluation findings, risk stratification, comorbidities and participant's personal goals.       Expected Outcomes Long Term: Exercising regularly at least 3-5 days a week.;Long Term: Add in home exercise to make exercise part of routine and to increase amount of physical activity.;Short Term: Attend rehab on a regular basis to increase amount of physical activity.       Increase Strength and Stamina Yes       Intervention Provide advice, education, support and counseling about physical activity/exercise needs.;Develop an individualized exercise prescription for aerobic and resistive training based on initial evaluation findings, risk stratification, comorbidities and participant's personal goals.       Expected Outcomes Long Term: Improve cardiorespiratory fitness, muscular endurance and strength as measured by increased METs and functional capacity ( );Short Term: Perform resistance training exercises routinely during rehab and add in resistance training at home;Short Term: Increase workloads from initial exercise  prescription for resistance, speed,  and METs.       Able to understand and use rate of perceived exertion (RPE) scale Yes       Intervention Provide education and explanation on how to use RPE scale       Expected Outcomes Long Term:  Able to use RPE to guide intensity level when exercising independently;Short Term: Able to use RPE daily in rehab to express subjective intensity level       Able to understand and use Dyspnea scale Yes       Intervention Provide education and explanation on how to use Dyspnea scale       Expected Outcomes Long Term: Able to use Dyspnea scale to guide intensity level when exercising independently;Short Term: Able to use Dyspnea scale daily in rehab to express subjective sense of shortness of breath during exertion       Knowledge and understanding of Target Heart Rate Range (THRR) Yes       Intervention Provide education and explanation of THRR including how the numbers were predicted and where they are located for reference       Expected Outcomes Long Term: Able to use THRR to govern intensity when exercising independently;Short Term: Able to use daily as guideline for intensity in rehab;Short Term: Able to state/look up THRR       Able to check pulse independently Yes       Intervention Review the importance of being able to check your own pulse for safety during independent exercise;Provide education and demonstration on how to check pulse in carotid and radial arteries.       Expected Outcomes Long Term: Able to check pulse independently and accurately;Short Term: Able to explain why pulse checking is important during independent exercise       Understanding of Exercise Prescription Yes       Intervention Provide education, explanation, and written materials on patient's individual exercise prescription       Expected Outcomes Short Term: Able to explain program exercise prescription;Long Term: Able to explain home exercise prescription to exercise independently                 Exercise Goals Re-Evaluation :  Exercise Goals Re-Evaluation     Row Name 09/09/23 1121 09/20/23 1459 10/07/23 1127 10/12/23 1124 10/19/23 1005     Exercise Goal Re-Evaluation   Exercise Goals Review Able to understand and use rate of perceived exertion (RPE) scale;Knowledge and understanding of Target Heart Rate Range (THRR);Able to understand and use Dyspnea scale;Understanding of Exercise Prescription Increase Physical Activity;Understanding of Exercise Prescription;Increase Strength and Stamina Increase Physical Activity;Understanding of Exercise Prescription;Increase Strength and Stamina Increase Physical Activity;Understanding of Exercise Prescription;Increase Strength and Stamina;Able to understand and use Dyspnea scale;Knowledge and understanding of Target Heart Rate Range (THRR);Able to check pulse independently;Able to understand and use rate of perceived exertion (RPE) scale Increase Physical Activity;Increase Strength and Stamina;Understanding of Exercise Prescription   Comments Reviewed RPE  and dyspnea scale, THR and program prescription with pt today.  Pt voiced understanding and was given a copy of goals to take home. Thomas Harper is off to a good start in the program. He has had 4 exercise sessions so far. He has increased the treadmill to a speed of 2.6. We will continue to monitor his progress in the program. Thomas Harper continues to do well in rehab. He recently increased his treadmill workload by adding 2% incline while maintaining a speed of 2.6 mph. He also improved to level 4 on the XR  and level 3 on the biostep. We will continue to monitor his progress in the program. Reviewed home exercise with pt today.  Pt plans to use his stationary bike and walk at home for exercise. He also states that he lives close to a gym and may look into getting a membership. Reviewed THR, pulse, RPE, sign and symptoms, pulse oximetery and when to call 911 or MD.  Also discussed weather  considerations and indoor options.  Pt voiced understanding. Thomas Harper is doing well in rehab. He has stayed consistent at level 3 on the T5 nustep and XR, level 2 on the biostep, and level 4 on the T4 nustep. He also has fluctuated his treadmill workload with a speed ranging from 2.3-2.7 mph and an incline ranging from 1.5-2.5%. We will continue to monitor his progress in the program.   Expected Outcomes Short: Use RPE daily to regulate intensity. Long: Follow program prescription in THR. Short: Continue to follow current exercise prescription and progressively increase workloads. Long: Continue exercise to improve strength and stamina. Short: Continue to progressively increase workloads. Long: Continue exercise to improve strength and stamina. Short: Add four additional day of exercise in addition to coming once a week to rehab. Long: Continue to exercise independently. Short: Try increasing to level 3 on the biostep. Long: Continue to increase overall METs and stamina.    Row Name 11/04/23 1127 11/16/23 1551 12/01/23 1424 12/14/23 1200 12/27/23 1521     Exercise Goal Re-Evaluation   Exercise Goals Review Increase Physical Activity;Increase Strength and Stamina;Understanding of Exercise Prescription Increase Physical Activity;Increase Strength and Stamina;Understanding of Exercise Prescription Increase Physical Activity;Increase Strength and Stamina;Understanding of Exercise Prescription Increase Physical Activity;Increase Strength and Stamina;Understanding of Exercise Prescription Increase Physical Activity;Increase Strength and Stamina;Understanding of Exercise Prescription   Comments Thomas Harper continues to do well in rehab, although he is only coming once a week due to work. He has continued to do well on the treadmill at a speed of 2.3 mph with a 2.5% incline. He also has stayed consistent at level 4 on the T4 nustep and using 5 lb handweights for resistance training. We will continue to monitor his progress in  the program. Thomas Harper continues to do well in rehab, although he is only coming once a week due to work. He increased his treadmill workload to a speed of 2.6 mph while maintaining a 2.5% incline. He also improved to level 5 on the T4 nustep and level 7 on the XR. We will continue to monitor his progress in the program. Thomas Harper has only attended one session since the last review. He continues to do well on the treadmill at a speed of 2.6 mph. He also did well at level 5 on the T5 nustep and used 5 lb hand weights for resistance training as well. We will continue to monitor his progress in the program. Thomas Harper is doing well in rehab. He increased his treadmill workload for one session up to a speed of 4 mph with an incline of 3.5%. He also continues to work at level 3 on the T5 nustep and use 5 lb hand weights for resistance training. He is due for his post and will look to improve on it. We will continue to monitor his progress in the program. Thomas Harper is doing well in rehab, however, he has only attended rehab once since the last review. His treadmill workload decreased back down to a speed of 2.7 mph and an incline of 2.5%. He also saw a decrease  in his workload on the T4 nustep down to level 2. He is due for his post as well and will look to improve on it. We will continue to monitor his progress in the program.   Expected Outcomes Short: Continue to progressively increase workloads and exercise on days away from rehab. Long: Continue to increase overall METs and stamina. Short: Continue to progressively increase treadmill workload and exercise on days away from rehab. Long: Continue to increase overall METs and stamina. Short: Continue to progressively increase treadmill workload and exercise on days away from rehab. Long: Continue to increase overall METs and stamina. Short: Improve on post . Long: Continue exercise to improve strength and stamina. Short: Increase workloads back up to previous levels, improve  on post . Long: Continue exercise to improve strength and stamina.    Row Name 01/12/24 1610 01/26/24 1401           Exercise Goal Re-Evaluation   Exercise Goals Review Increase Physical Activity;Increase Strength and Stamina;Understanding of Exercise Prescription Increase Physical Activity;Increase Strength and Stamina;Understanding of Exercise Prescription      Comments Thomas Harper was unable to attend rehab during this review session. We will attempt to reach out in order to determine his status. Thomas Harper was able to return to rehab and is doing well. He was able to increase his incline on the treadmill from 2.5 to 4.5%. he was also able to increase his level on the T4 nustep from level 2 to level 6. We will continue to monitor his progress in the program.      Expected Outcomes Short: Return to rehab. Long: Continue exercise to improve strength and stamina. Short: Continue to increase workloads when appropriate, and improve post-6MWT. Long: Continue exercise to increase strength and stamina.               Discharge Exercise Prescription (Final Exercise Prescription Changes):  Exercise Prescription Changes - 01/26/24 1300       Response to Exercise   Blood Pressure (Admit) 104/58    Blood Pressure (Exit) 122/64    Heart Rate (Admit) 79 bpm    Heart Rate (Exercise) 97 bpm    Heart Rate (Exit) 74 bpm    Rating of Perceived Exertion (Exercise) 12    Perceived Dyspnea (Exercise) 0    Symptoms none    Duration Continue with 30 min of aerobic exercise without signs/symptoms of physical distress.    Intensity THRR unchanged      Progression   Progression Continue to progress workloads to maintain intensity without signs/symptoms of physical distress.    Average METs 4.1      Resistance Training   Training Prescription Yes    Weight 5 lb    Reps 10-15      Interval Training   Interval Training No      Treadmill   MPH 2.7    Grade 4.5    Minutes 15    METs 4.74      NuStep    Level 6    Minutes 15    METs 8.8      REL-XR   Level 4    Minutes 15      T5 Nustep   Level 3    Minutes 15    METs 2.7      Home Exercise Plan   Plans to continue exercise at Home (comment)   stationary bike, walking at home, looking into getting a gym membership   Frequency Add 4 additional  days to program exercise sessions.    Initial Home Exercises Provided 10/12/23      Oxygen   Maintain Oxygen Saturation 88% or higher             Nutrition:  Target Goals: Understanding of nutrition guidelines, daily intake of sodium 1500mg , cholesterol 200mg , calories 30% from fat and 7% or less from saturated fats, daily to have 5 or more servings of fruits and vegetables.  Education: All About Nutrition: -Group instruction provided by verbal, written material, interactive activities, discussions, models, and posters to present general guidelines for heart healthy nutrition including fat, fiber, MyPlate, the role of sodium in heart healthy nutrition, utilization of the nutrition label, and utilization of this knowledge for meal planning. Follow up email sent as well. Written material given at graduation.   Biometrics:  Pre Biometrics - 09/06/23 1553       Pre Biometrics   Height 5\' 5"  (1.651 m)    Weight 163 lb 8 oz (74.2 kg)    Waist Circumference 37 inches    Hip Circumference 37.5 inches    Waist to Hip Ratio 0.99 %    BMI (Calculated) 27.21    Single Leg Stand 30 seconds             Post Biometrics - 01/27/24 1119        Post  Biometrics   Height 5\' 5"  (1.651 m)    Weight 168 lb (76.2 kg)    Waist Circumference 37 inches    Hip Circumference 37 inches    Waist to Hip Ratio 1 %    BMI (Calculated) 27.96    Single Leg Stand 28.7 seconds             Nutrition Therapy Plan and Nutrition Goals:   Nutrition Assessments:  MEDIFICTS Score Key: >=70 Need to make dietary changes  40-70 Heart Healthy Diet <= 40 Therapeutic Level Cholesterol  Diet  Flowsheet Row Cardiac Rehab from 09/12/2023 in [facility] Cardiac and Pulmonary Rehab  Picture Your Plate Total Score on Admission 66      Picture Your Plate Scores: <16 Unhealthy dietary pattern with much room for improvement. 41-50 Dietary pattern unlikely to meet recommendations for good health and room for improvement. 51-60 More healthful dietary pattern, with some room for improvement.  >60 Healthy dietary pattern, although there may be some specific behaviors that could be improved.    Nutrition Goals Re-Evaluation:  Nutrition Goals Re-Evaluation     Row Name 11/11/23 1119             Goals   Comment Patient deferred dietitian appointment                Nutrition Goals Discharge (Final Nutrition Goals Re-Evaluation):  Nutrition Goals Re-Evaluation - 11/11/23 1119       Goals   Comment Patient deferred dietitian appointment             Psychosocial: Target Goals: Acknowledge presence or absence of significant depression and/or stress, maximize coping skills, provide positive support system. Participant is able to verbalize types and ability to use techniques and skills needed for reducing stress and depression.   Education: Stress, Anxiety, and Depression - Group verbal and visual presentation to define topics covered.  Reviews how body is impacted by stress, anxiety, and depression.  Also discusses healthy ways to reduce stress and to treat/manage anxiety and depression.  Written material given at graduation. Flowsheet Row Cardiac Rehab from 10/12/2023 in [facility] Cardiac  and Pulmonary Rehab  Date 10/05/23  Educator SB  Instruction Review Code 1- [employer] Understanding       Education: Sleep Hygiene -Provides group verbal and written instruction about how sleep can affect your health.  Define sleep hygiene, discuss sleep cycles and impact of sleep habits. Review good sleep hygiene tips.    Initial Review & Psychosocial Screening:  Initial Psych  Review & Screening - 08/25/23 1005       Initial Review   Current issues with None Identified      Family Dynamics   Good Support System? Yes    Comments He can look to his wife for support. He is ready to start rehab and get into healthy lifestyle.      Barriers   Psychosocial barriers to participate in program There are no identifiable barriers or psychosocial needs.;The patient should benefit from training in stress management and relaxation.      Screening Interventions   Interventions Encouraged to exercise;To provide support and resources with identified psychosocial needs;Provide feedback about the scores to participant    Expected Outcomes Short Term goal: Utilizing psychosocial counselor, staff and physician to assist with identification of specific Stressors or current issues interfering with healing process. Setting desired goal for each stressor or current issue identified.;Long Term Goal: Stressors or current issues are controlled or eliminated.;Short Term goal: Identification and review with participant of any Quality of Life or Depression concerns found by scoring the questionnaire.;Long Term goal: The participant improves quality of Life and PHQ9 Scores as seen by post scores and/or verbalization of changes             Quality of Life Scores:   Quality of Life - 09/12/23 1121       Quality of Life   Select Quality of Life      Quality of Life Scores   Health/Function Pre 21.2 %    Socioeconomic Pre 20.64 %    Psych/Spiritual Pre 22.07 %    Family Pre 19.17 %    GLOBAL Pre 21.06 %            Scores of 19 and below usually indicate a poorer quality of life in these areas.  A difference of  2-3 points is a clinically meaningful difference.  A difference of 2-3 points in the total score of the Quality of Life Index has been associated with significant improvement in overall quality of life, self-image, physical symptoms, and general health in studies assessing  change in quality of life.  PHQ-9: Review Flowsheet  More data exists      09/06/2023 05/13/2023 05/02/2023 11/12/2022 01/22/2022  Depression screen PHQ 2/9  Decreased Interest 0 1 0 0 0  Down, Depressed, Hopeless 0 0 0 0 0  PHQ - 2 Score 0 1 0 0 0  Altered sleeping 2 1 0 - -  Tired, decreased energy 2 1 0 - -  Change in appetite 0 0 0 - -  Feeling bad or failure about yourself  0 0 0 - -  Trouble concentrating 0 0 0 - -  Moving slowly or fidgety/restless 0 0 0 - -  Suicidal thoughts 0 0 0 - -  PHQ-9 Score 4 3 0 - -  Difficult doing work/chores Somewhat difficult Not difficult at all Not difficult at all - -   Interpretation of Total Score  Total Score Depression Severity:  1-4 = Minimal depression, 5-9 = Mild depression, 10-14 = Moderate depression, 15-19 = Moderately severe  depression, 20-27 = Severe depression   Psychosocial Evaluation and Intervention:  Psychosocial Evaluation - 08/25/23 1008       Psychosocial Evaluation & Interventions   Interventions Encouraged to exercise with the program and follow exercise prescription;Relaxation education;Stress management education    Comments He can look to his wife for support. He is ready to start rehab and get into healthy lifestyle.    Expected Outcomes Short: Start [facility] to help with mood. Long: Maintain a healthy mental state    Continue Psychosocial Services  Follow up required by staff             Psychosocial Re-Evaluation:  Psychosocial Re-Evaluation     Row Name 11/11/23 1118             Psychosocial Re-Evaluation   Current issues with None Identified       Comments Patient reports no issues with their current mental states, sleep, stress, depression or anxiety. Will follow up with patient in a few weeks for any changes.       Expected Outcomes Short: Continue to exercise regularly to support mental health and notify staff of any changes. Long: maintain mental health and well being through teaching of rehab  or prescribed medications independently.       Interventions Encouraged to attend Cardiac Rehabilitation for the exercise       Continue Psychosocial Services  Follow up required by staff                Psychosocial Discharge (Final Psychosocial Re-Evaluation):  Psychosocial Re-Evaluation - 11/11/23 1118       Psychosocial Re-Evaluation   Current issues with None Identified    Comments Patient reports no issues with their current mental states, sleep, stress, depression or anxiety. Will follow up with patient in a few weeks for any changes.    Expected Outcomes Short: Continue to exercise regularly to support mental health and notify staff of any changes. Long: maintain mental health and well being through teaching of rehab or prescribed medications independently.    Interventions Encouraged to attend Cardiac Rehabilitation for the exercise    Continue Psychosocial Services  Follow up required by staff             Vocational Rehabilitation: Provide vocational rehab assistance to qualifying candidates.   Vocational Rehab Evaluation & Intervention:   Education: Education Goals: Education classes will be provided on a variety of topics geared toward better understanding of heart health and risk factor modification. Participant will state understanding/return demonstration of topics presented as noted by education test scores.  Learning Barriers/Preferences:  Learning Barriers/Preferences - 08/25/23 1004       Learning Barriers/Preferences   Learning Barriers None    Learning Preferences None             General Cardiac Education Topics:  AED/CPR: - Group verbal and written instruction with the use of models to demonstrate the basic use of the AED with the basic ABC's of resuscitation.   Anatomy and Cardiac Procedures: - Group verbal and visual presentation and models provide information about basic cardiac anatomy and function. Reviews the testing methods done to  diagnose heart disease and the outcomes of the test results. Describes the treatment choices: Medical Management, Angioplasty, or Coronary Bypass Surgery for treating various heart conditions including Myocardial Infarction, Angina, Valve Disease, and Cardiac Arrhythmias.  Written material given at graduation.   Medication Safety: - Group verbal and visual instruction to review commonly prescribed medications for heart  and lung disease. Reviews the medication, class of the drug, and side effects. Includes the steps to properly store meds and maintain the prescription regimen.  Written material given at graduation. Flowsheet Row Cardiac Rehab from 10/12/2023 in [facility] Cardiac and Pulmonary Rehab  Date 09/14/23  Educator SB  Instruction Review Code 1- Verbalizes Understanding       Intimacy: - Group verbal instruction through game format to discuss how heart and lung disease can affect sexual intimacy. Written material given at graduation..   Know Your Numbers and Heart Failure: - Group verbal and visual instruction to discuss disease risk factors for cardiac and pulmonary disease and treatment options.  Reviews associated critical values for Overweight/Obesity, Hypertension, Cholesterol, and Diabetes.  Discusses basics of heart failure: signs/symptoms and treatments.  Introduces Heart Failure Zone chart for action plan for heart failure.  Written material given at graduation. Flowsheet Row Cardiac Rehab from 10/12/2023 in [facility] Cardiac and Pulmonary Rehab  Date 09/28/23  Educator SB  Instruction Review Code 1- Verbalizes Understanding       Infection Prevention: - Provides verbal and written material to individual with discussion of infection control including proper hand washing and proper equipment cleaning during exercise session. Flowsheet Row Cardiac Rehab from 10/12/2023 in [facility] Cardiac and Pulmonary Rehab  Date 09/06/23  Educator mc  Instruction Review Code 1- Verbalizes  Understanding       Falls Prevention: - Provides verbal and written material to individual with discussion of falls prevention and safety. Flowsheet Row Cardiac Rehab from 10/12/2023 in [facility] Cardiac and Pulmonary Rehab  Date 09/06/23  Educator mc  Instruction Review Code 1- Verbalizes Understanding       Other: -Provides group and verbal instruction on various topics (see comments)   Knowledge Questionnaire Score:  Knowledge Questionnaire Score - 09/12/23 1110       Knowledge Questionnaire Score   Pre Score 23             Core Components/Risk Factors/Patient Goals at Admission:  Personal Goals and Risk Factors at Admission - 09/06/23 1556       Core Components/Risk Factors/Patient Goals on Admission    Weight Management Yes;Weight Maintenance    Intervention Weight Management: Develop a combined nutrition and exercise program designed to reach desired caloric intake, while maintaining appropriate intake of nutrient and fiber, sodium and fats, and appropriate energy expenditure required for the weight goal.;Weight Management: Provide education and appropriate resources to help participant work on and attain dietary goals.;Weight Management/Obesity: Establish reasonable short term and long term weight goals.    Admit Weight 163 lb 8 oz (74.2 kg)    Goal Weight: Short Term 160 lb (72.6 kg)    Goal Weight: Long Term 155 lb (70.3 kg)    Expected Outcomes Short Term: Continue to assess and modify interventions until short term weight is achieved;Long Term: Adherence to nutrition and physical activity/exercise program aimed toward attainment of established weight goal;Weight Maintenance: Understanding of the daily nutrition guidelines, which includes 25-35% calories from fat, 7% or less cal from saturated fats, less than 200mg  cholesterol, less than 1.5gm of sodium, & 5 or more servings of fruits and vegetables daily;Understanding recommendations for meals to include 15-35% energy  as protein, 25-35% energy from fat, 35-60% energy from carbohydrates, less than 200mg  of dietary cholesterol, 20-35 gm of total fiber daily;Understanding of distribution of calorie intake throughout the day with the consumption of 4-5 meals/snacks    Hypertension Yes    Intervention Provide education on lifestyle  modifcations including regular physical activity/exercise, weight management, moderate sodium restriction and increased consumption of fresh fruit, vegetables, and low fat dairy, alcohol moderation, and smoking cessation.;Monitor prescription use compliance.    Expected Outcomes Short Term: Continued assessment and intervention until BP is < 140/63mm HG in hypertensive participants. < 130/61mm HG in hypertensive participants with diabetes, heart failure or chronic kidney disease.;Long Term: Maintenance of blood pressure at goal levels.             Education:Diabetes - Individual verbal and written instruction to review signs/symptoms of diabetes, desired ranges of glucose level fasting, after meals and with exercise. Acknowledge that pre and post exercise glucose checks will be done for 3 sessions at entry of program.   Core Components/Risk Factors/Patient Goals Review:   Goals and Risk Factor Review     Row Name 11/11/23 1120             Core Components/Risk Factors/Patient Goals Review   Personal Goals Review Weight Management/Obesity       Review Thomas Harper states that he wants to maintain his weight at this point. He has started working and feels pretty well health wise. He was 170lbs today and wants to maintain that weight. He has no questions about his medications at this time.       Expected Outcomes Short: continue to exercise. Long: maintain weight independently                Core Components/Risk Factors/Patient Goals at Discharge (Final Review):   Goals and Risk Factor Review - 11/11/23 1120       Core Components/Risk Factors/Patient Goals Review   Personal Goals  Review Weight Management/Obesity    Review Thomas Harper states that he wants to maintain his weight at this point. He has started working and feels pretty well health wise. He was 170lbs today and wants to maintain that weight. He has no questions about his medications at this time.    Expected Outcomes Short: continue to exercise. Long: maintain weight independently             ITP Comments:  ITP Comments     Row Name 08/25/23 1003 09/06/23 1543 09/09/23 1120 09/28/23 1258 10/26/23 1238   ITP Comments Virtual Visit completed. Patient informed on EP and RD appointment and 6 Minute walk test. Patient also informed of patient health questionnaires on My Chart. Patient Verbalizes understanding. Visit diagnosis can be found in [facility] 07/13/2023. Completed and gym orientation. Initial ITP created and sent for review to Dr. Cristal Ford, Medical Director. First full day of exercise!  Patient was oriented to gym and equipment including functions, settings, policies, and procedures.  Patient's individual exercise prescription and treatment plan were reviewed.  All starting workloads were established based on the results of the 6 minute walk test done at initial orientation visit.  The plan for exercise progression was also introduced and progression will be customized based on patient's performance and goals. 30 Day review completed. Medical Director ITP review done, changes made as directed, and signed approval by Medical Director.    new to program 30 Day review completed. Medical Director ITP review done, changes made as directed, and signed approval by Medical Director.    Row Name 11/16/23 1006 12/14/23 1210 01/11/24 1323 02/08/24 0846     ITP Comments 30 Day review completed. Medical Director ITP review done, changes made as directed, and signed approval by Medical Director. 30 Day review completed. Medical Director ITP review done, changes made  as directed, and signed approval by Wellsite geologist. 30 Day  review completed. Medical Director ITP review done, changes made as directed, and signed approval by Medical Director. 30 Day review completed. Medical Director ITP review done, changes made as directed, and signed approval by Medical Director.             Comments: 30 day review

## 2024-02-10 DIAGNOSIS — Z951 Presence of aortocoronary bypass graft: Secondary | ICD-10-CM

## 2024-02-10 NOTE — Progress Notes (Signed)
Daily Session Note  Patient Details  Name: Thomas Harper MRN: [id number] Date of Birth: September 25, 1961 Referring Provider:   Flowsheet Row Cardiac Rehab from 09/06/2023 in [facility] Cardiac and Pulmonary Rehab  Referring Provider Dr. Marikay Alar       Encounter Date: 02/10/2024  Check In:  Session Check In - 02/10/24 1115       Check-In   Supervising physician immediately available to respond to emergencies See telemetry face sheet for immediately available ER MD    Location [facility]-Cardiac & Pulmonary Rehab    Staff Present Kelton Pillar RN,BSN,MPA;Noah Tickle, BS, Exercise Physiologist;Joseph Hollace Kinnier    Virtual Visit No    Medication changes reported     No    Fall or balance concerns reported    No    Tobacco Cessation No Change    Warm-up and Cool-down Performed on first and last piece of equipment    Resistance Training Performed Yes    VAD Patient? No    PAD/SET Patient? No      Pain Assessment   Currently in Pain? No/denies                Social History   Tobacco Use  Smoking Status Former   Current packs/day: 0.00   Types: Cigarettes   Quit date: 05/21/2021   Years since quitting: 2.7  Smokeless Tobacco Former    Goals Met:  Independence with exercise equipment Exercise tolerated well No report of concerns or symptoms today Strength training completed today  Goals Unmet:  Not Applicable  Comments: Pt able to follow exercise prescription today without complaint.  Will continue to monitor for progression.    Dr. Bethann Punches is Medical Director for [facility] Cardiac Rehabilitation.  Dr. Vida Rigger is Medical Director for [facility] Pulmonary Rehabilitation.

## 2024-02-17 ENCOUNTER — Encounter: Payer: 59 | Admitting: *Deleted

## 2024-02-17 DIAGNOSIS — Z951 Presence of aortocoronary bypass graft: Secondary | ICD-10-CM

## 2024-02-17 NOTE — Progress Notes (Signed)
Daily Session Note  Patient Details  Name: Thomas Harper MRN: [id number] Date of Birth: Jun 04, 1961 Referring Provider:   Flowsheet Row Cardiac Rehab from 09/06/2023 in [facility] Cardiac and Pulmonary Rehab  Referring Provider Dr. Marikay Alar       Encounter Date: 02/17/2024  Check In:  Session Check In - 02/17/24 1113       Check-In   Supervising physician immediately available to respond to emergencies See telemetry face sheet for immediately available ER MD    Location [facility]-Cardiac & Pulmonary Rehab    Staff Present Cora Collum, RN, BSN, CCRP;Joseph Hood RCP,RRT,BSRT;Noah Tickle, [place], Exercise Physiologist    Virtual Visit No    Medication changes reported     No    Fall or balance concerns reported    No    Warm-up and Cool-down Performed on first and last piece of equipment    Resistance Training Performed Yes    VAD Patient? No    PAD/SET Patient? No      Pain Assessment   Currently in Pain? No/denies                Social History   Tobacco Use  Smoking Status Former   Current packs/day: 0.00   Types: Cigarettes   Quit date: 05/21/2021   Years since quitting: 2.7  Smokeless Tobacco Former    Goals Met:  Independence with exercise equipment Exercise tolerated well No report of concerns or symptoms today  Goals Unmet:  Not Applicable  Comments: Pt able to follow exercise prescription today without complaint.  Will continue to monitor for progression.    Dr. Bethann Punches is Medical Director for [facility] Cardiac Rehabilitation.  Dr. Vida Rigger is Medical Director for [facility] Pulmonary Rehabilitation.
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
## 2024-02-23 ENCOUNTER — Encounter: Payer: Self-pay | Admitting: Pulmonary Disease

## 2024-02-23 ENCOUNTER — Ambulatory Visit (INDEPENDENT_AMBULATORY_CARE_PROVIDER_SITE_OTHER): Payer: 59

## 2024-02-23 ENCOUNTER — Ambulatory Visit: Payer: 59 | Attending: Cardiology | Admitting: Cardiology

## 2024-02-23 ENCOUNTER — Encounter: Payer: Self-pay | Admitting: Cardiology

## 2024-02-23 ENCOUNTER — Ambulatory Visit: Payer: 59 | Admitting: Pulmonary Disease

## 2024-02-23 VITALS — BP 114/66 | HR 62 | Temp 97.6°F | Ht 65.0 in | Wt 168.8 lb

## 2024-02-23 VITALS — BP 135/88 | HR 67 | Ht 66.0 in | Wt 168.6 lb

## 2024-02-23 DIAGNOSIS — I483 Typical atrial flutter: Secondary | ICD-10-CM

## 2024-02-23 DIAGNOSIS — G4730 Sleep apnea, unspecified: Secondary | ICD-10-CM | POA: Diagnosis not present

## 2024-02-23 DIAGNOSIS — Z87891 Personal history of nicotine dependence: Secondary | ICD-10-CM

## 2024-02-23 DIAGNOSIS — E785 Hyperlipidemia, unspecified: Secondary | ICD-10-CM | POA: Diagnosis not present

## 2024-02-23 DIAGNOSIS — D151 Benign neoplasm of heart: Secondary | ICD-10-CM | POA: Diagnosis not present

## 2024-02-23 DIAGNOSIS — I25119 Atherosclerotic heart disease of native coronary artery with unspecified angina pectoris: Secondary | ICD-10-CM

## 2024-02-23 DIAGNOSIS — Z951 Presence of aortocoronary bypass graft: Secondary | ICD-10-CM

## 2024-02-23 DIAGNOSIS — I2510 Atherosclerotic heart disease of native coronary artery without angina pectoris: Secondary | ICD-10-CM

## 2024-02-23 DIAGNOSIS — I4892 Unspecified atrial flutter: Secondary | ICD-10-CM | POA: Diagnosis not present

## 2024-02-23 DIAGNOSIS — I10 Essential (primary) hypertension: Secondary | ICD-10-CM

## 2024-02-23 NOTE — Progress Notes (Unsigned)
 Enrolled for [employer] to mail a ZIO XT long term holter monitor to the patients address on file.

## 2024-02-23 NOTE — Progress Notes (Signed)
 @Patient  ID: Thomas Harper, male    DOB: 1961/03/11, 63 y.o.   MRN: [id number]  Chief Complaint  Patient presents with   Follow-up    Patient states his breathing is a lot better    Referring provider: Dale Vincent, MD  HPI:   63 y.o. man with significant CAD status post CABG 06/2023 whom we are seeing for evaluation of dyspnea on exertion. Most recent PCP note reviewed.  Returns for routine follow-up.  Doing well.  Denies significant dyspnea.  Not using Stiolto.  Has albuterol.  Rarely uses.  No real concerns.  Tried CPAP for a while.  Could not tolerate it did not feel good.  He is returned the machine.  HPI initial visit: Patient developed shortness of breath.  Over the last several months.  Workup including cardiology evaluation.  Underwent CT coronary 05-2023 with clear lungs but did demonstrate significant CAD with flow limitation as well as concern for left atrial mass.  Eventually underwent echocardiogram demonstrated normal EF.  Then underwent left heart cath with multivessel disease.  Recommended CABG.  Underwent CABG and left atrial myxoma resection.  Eventually discharged in the hospital.  Overall shortness of breath seems to be getting better.  Snores quite a bit.  Concerned about sleep apnea.  Reviewed most recent chest x-ray 07/22/2023.  Clear lungs.  He is a former smoker.  Questionaires / Pulmonary Flowsheets:   ACT:      No data to display          MMRC:     No data to display          Epworth:      No data to display          Tests:   FENO:  No results found for: "NITRICOXIDE"  PFT:    Latest Ref Rng & Units 09/28/2023    3:50 PM  PFT Results  FVC-Pre L 2.68   FVC-Predicted Pre % 66   FVC-Post L 2.97   FVC-Predicted Post % 73   Pre FEV1/FVC % % 75   Post FEV1/FCV % % 74   FEV1-Pre L 2.02   FEV1-Predicted Pre % 66   FEV1-Post L 2.20   DLCO uncorrected ml/min/mmHg 16.58   DLCO UNC% % 68   DLCO corrected ml/min/mmHg 16.78   DLCO  COR %Predicted % 69   DLVA Predicted % 80   TLC L 5.64   TLC % Predicted % 91   RV % Predicted % 128   Personally reviewed interpreted spirometry suggestive of moderate restriction versus air trapping.  No bronchodilator response.  Lung volumes consistent with air trapping.  DLCO moderately reduced.  WALK:      No data to display          Imaging: Personally reviewed and as per EMR and discussion in this note No results found.  Lab Results: Personally reviewed CBC    Component Value Date/Time   WBC 5.9 09/13/2023 1214   RBC 5.01 09/13/2023 1214   HGB 14.2 09/13/2023 1214   HGB 12.5 (L) 08/03/2023 1447   HCT 44.3 09/13/2023 1214   HCT 39.2 08/03/2023 1447   PLT 297.0 09/13/2023 1214   PLT 621 (H) 08/03/2023 1447   MCV 88.4 09/13/2023 1214   MCV 90 08/03/2023 1447   MCH 28.6 08/03/2023 1447   MCH 29.0 07/17/2023 0712   MCHC 32.0 09/13/2023 1214   RDW 14.8 09/13/2023 1214   RDW 12.5 08/03/2023 1447   LYMPHSABS  1.4 09/13/2023 1214   MONOABS 0.6 09/13/2023 1214   EOSABS 0.1 09/13/2023 1214   BASOSABS 0.0 09/13/2023 1214    BMET    Component Value Date/Time   NA 138 12/23/2023 0850   NA 138 08/03/2023 1447   K 4.6 12/23/2023 0850   CL 101 12/23/2023 0850   CO2 28 12/23/2023 0850   GLUCOSE 96 12/23/2023 0850   BUN 19 12/23/2023 0850   BUN 18 08/03/2023 1447   CREATININE 0.93 12/23/2023 0850   CALCIUM 9.4 12/23/2023 0850   GFRNONAA >60 07/18/2023 0215   GFRAA >60 11/04/2018 1539    BNP No results found for: "BNP"  ProBNP No results found for: "PROBNP"  Specialty Problems       Pulmonary Problems   SOB (shortness of breath)   Pulmonary 07/2023 - recommended PFTs (3 months after CABG) F/u 10/2023 - PFTs 09/2023 did reveal air trapping.  All symptoms largely improved okay to trial bronchodilators.  Stiolto samples.  Albuterol as needed prescribed today..       Snoring   Evaluated by pulmonary 07/2023 - plan HST.        Sleep apnea   F/u pulmonary  10/2023 - Obstructive sleep apnea: Loud snoring etc. in the past.  Sleep study 09/2023 reveals mild sleep apnea AHI 9.7.  Recommend CPAP.  Order for auto titrating CPAP.  With nasal pillows.         Allergies  Allergen Reactions   Fish Oil Rash   Lisinopril Other (See Comments)    Fatigue   Simvastatin Rash    Immunization History  Administered Date(s) Administered   Influenza, Seasonal, Injecte, Preservative Fre 09/13/2023   Influenza,inj,Quad PF,6+ Mos 11/04/2017, 09/15/2018, 10/19/2019, 10/03/2020, 11/12/2022   Moderna Sars-Covid-2 Vaccination 03/28/2020, 06/20/2020   PPD Test 06/13/2012    Past Medical History:  Diagnosis Date   Anginal pain (HCC)    Chronic leg pain    s/p injury right leg (age 1)   Coronary artery disease    GERD (gastroesophageal reflux disease)    History of ankle fracture    persistent pain   History of kidney stones    Hypercholesterolemia    Hypertension    Nephrolithiasis    followed by Assunta Gambles   Vitamin D deficiency     Tobacco History: Social History   Tobacco Use  Smoking Status Former   Current packs/day: 0.00   Types: Cigarettes   Quit date: 05/21/2021   Years since quitting: 2.7  Smokeless Tobacco Former   Counseling given: Not Answered   Continue to not smoke  Outpatient Encounter Medications as of 02/23/2024  Medication Sig   acetaminophen (TYLENOL) 500 MG tablet Take 1,000 mg by mouth every 8 (eight) hours as needed for headache.   aspirin EC 81 MG tablet Take 1 tablet (81 mg total) by mouth daily. Swallow whole.   atorvastatin (LIPITOR) 80 MG tablet Take 1 tablet (80 mg total) by mouth daily.   calcium carbonate (TUMS - DOSED IN MG ELEMENTAL CALCIUM) 500 MG chewable tablet Chew 1 tablet by mouth continuous as needed for indigestion or heartburn.   clopidogrel (PLAVIX) 75 MG tablet Take 1 tablet (75 mg total) by mouth daily.   lansoprazole (PREVACID) 15 MG capsule Take 15 mg by mouth daily in the afternoon.    pantoprazole (PROTONIX) 40 MG tablet TAKE ONE TABLET (40 MG TOTAL) BY MOUTH DAILY. TAKE 30 MINUTES BEFORE YOUR EVENING MEAL   traMADol (ULTRAM) 50 MG tablet Take 1 tablet (  50 mg total) by mouth every 6 (six) hours as needed.   albuterol (VENTOLIN HFA) 108 (90 Base) MCG/ACT inhaler Inhale 2 puffs into the lungs every 6 (six) hours as needed for wheezing or shortness of breath. (Patient not taking: Reported on 02/23/2024)   metoprolol tartrate (LOPRESSOR) 50 MG tablet Take 1 tablet (50 mg total) by mouth 2 (two) times daily.   Tiotropium Bromide-Olodaterol (STIOLTO RESPIMAT) 2.5-2.5 MCG/ACT AERS Inhale 2 puffs into the lungs daily. (Patient not taking: Reported on 02/23/2024)   No facility-administered encounter medications on file as of 02/23/2024.     Review of Systems  Review of Systems  N/a Physical Exam  BP 114/66 (BP Location: Left Arm, Patient Position: Sitting, Cuff Size: Normal)   Pulse 62   Temp 97.6 F (36.4 C) (Oral)   Ht 5\' 5"  (1.651 m)   Wt 168 lb 12.8 oz (76.6 kg)   SpO2 96%   BMI 28.09 kg/m   Wt Readings from Last 5 Encounters:  02/23/24 168 lb 12.8 oz (76.6 kg)  01/27/24 168 lb (76.2 kg)  12/09/23 169 lb 3.2 oz (76.7 kg)  11/14/23 177 lb (80.3 kg)  10/11/23 168 lb 6.4 oz (76.4 kg)    BMI Readings from Last 5 Encounters:  02/23/24 28.09 kg/m  01/27/24 27.96 kg/m  12/09/23 27.31 kg/m  11/14/23 28.57 kg/m  10/11/23 27.18 kg/m     Physical Exam General: Sitting in chair, no acute distress Eyes: EOMI, no icterus Neck: Supple, no JVP appreciated Cardiovascular: Warm, no edema Pulmonary: Clear, no work of breathing Abdomen: Nondistended, bowel sounds present MSK: No synovitis, no joint effusion Neuro: Normal gait, no weakness Psych: Normal mood, full affect   Assessment & Plan:   Dyspnea on exertion: Marked improvement after CABG, likely mostly due to CAD.  Atrial fibrillation could be contributing as well.  History of smoking, however now quit and  congratulated on this.  PFTs 09/2023 did reveal air trapping.  All symptoms largely improved.  Stiolto trial in the past, not currently using.  No real symptoms.  Recommend continue albuterol as needed.  Obstructive sleep apnea: Loud snoring etc. in the past.  Sleep study 09/2023 reveals mild sleep apnea AHI 9.7.  Auto titrating CPAP prescribed at last visit.  He was unable to tolerate this and is returned to DME company.  No further follow-up, consider inspire in the future if he desires to reengage.   Return if symptoms worsen or fail to improve.   Karren Burly, MD 02/23/2024

## 2024-02-23 NOTE — Patient Instructions (Signed)
 Medication Instructions:  Continue current medicaitons *If you need a refill on your cardiac medications before your next appointment, please call your pharmacy*   Lab Work: none If you have labs (blood work) drawn today and your tests are completely normal, you will receive your results only by: MyChart Message (if you have MyChart) OR A paper copy in the mail If you have any lab test that is abnormal or we need to change your treatment, we will call you to review the results.   Testing/Procedures: Suezanne Jacquet- Long Term Monitor Instructions  Your physician has requested you wear a ZIO patch monitor for 14 days.  This is a single patch monitor. [employer] supplies one patch monitor per enrollment. Additional stickers are not available. Please do not apply patch if you will be having a Nuclear Stress Test,  Echocardiogram, Cardiac CT, MRI, or Chest Xray during the period you would be wearing the  monitor. The patch cannot be worn during these tests. You cannot remove and re-apply the  ZIO XT patch monitor.  Your ZIO patch monitor will be mailed 3 day USPS to your address on file. It may take 3-5 days  to receive your monitor after you have been enrolled.  Once you have received your monitor, please review the enclosed instructions. Your monitor  has already been registered assigning a specific monitor serial # to you.  Billing and Patient Assistance Program Information  We have supplied [employer] with any of your insurance information on file for billing purposes. [employer] offers a sliding scale Patient Assistance Program for patients that do not have  insurance, or whose insurance does not completely cover the cost of the ZIO monitor.  You must apply for the Patient Assistance Program to qualify for this discounted rate.  To apply, please call [employer] at [phone], select option 4, select option 2, ask to apply for  Patient Assistance Program. Meredeth Ide will ask your household income,  and how many people  are in your household. They will quote your out-of-pocket cost based on that information.  [employer] will also be able to set up a 36-month, interest-free payment plan if needed.  Applying the monitor   Shave hair from upper left chest.  Hold abrader disc by orange tab. Rub abrader in 40 strokes over the upper left chest as  indicated in your monitor instructions.  Clean area with 4 enclosed alcohol pads. Let dry.  Apply patch as indicated in monitor instructions. Patch will be placed under collarbone on left  side of chest with arrow pointing upward.  Rub patch adhesive wings for 2 minutes. Remove white label marked "1". Remove the white  label marked "2". Rub patch adhesive wings for 2 additional minutes.  While looking in a mirror, press and release button in center of patch. A small green light will  flash 3-4 times. This will be your only indicator that the monitor has been turned on.  Do not shower for the first 24 hours. You may shower after the first 24 hours.  Press the button if you feel a symptom. You will hear a small click. Record Date, Time and  Symptom in the Patient Logbook.  When you are ready to remove the patch, follow instructions on the last 2 pages of Patient  Logbook. Stick patch monitor onto the last page of Patient Logbook.  Place Patient Logbook in the blue and white box. Use locking tab on box and tape box closed  securely. The blue and  white box has prepaid postage on it. Please place it in the mailbox as  soon as possible. Your physician should have your test results approximately 7 days after the  monitor has been mailed back to [facility]  Call [facility] Customer Care at [phone] if you have questions regarding  your ZIO XT patch monitor. Call them immediately if you see an orange light blinking on your  monitor.  If your monitor falls off in less than 4 days, contact our Monitor department at [phone].  If your monitor  becomes loose or falls off after 4 days call [employer] at [phone] for  suggestions on securing your monitor    Follow-Up: At [facility], you and your health needs are our priority.  As part of our continuing mission to provide you with exceptional heart care, we have created designated Provider Care Teams.  These Care Teams include your primary Cardiologist (physician) and Advanced Practice Providers (APPs -  Physician Assistants and Nurse Practitioners) who all work together to provide you with the care you need, when you need it.  We recommend signing up for the patient portal called "MyChart".  Sign up information is provided on this After Visit Summary.  MyChart is used to connect with patients for Virtual Visits (Telemedicine).  Patients are able to view lab/test results, encounter notes, upcoming appointments, etc.  Non-urgent messages can be sent to your provider as well.   To learn more about what you can do with MyChart, go to [email or web address].    Your next appointment:   6 month(s)  Provider:   Little Ishikawa, MD     Other Instructions Referral to Pharm D - lipids

## 2024-02-24 ENCOUNTER — Encounter: Payer: 59 | Admitting: *Deleted

## 2024-02-24 DIAGNOSIS — Z951 Presence of aortocoronary bypass graft: Secondary | ICD-10-CM

## 2024-02-24 NOTE — Progress Notes (Signed)
 Discharge Summary   Thomas Harper  DOB: [birth year]  [name] graduated today from  rehab with 36 sessions completed.  Details of the patient's exercise prescription and what He needs to do in order to continue the prescription and progress were discussed with patient.  Patient was given a copy of prescription and goals.  Patient verbalized understanding. [name] plans to continue to exercise by using his stationary bike at home and walking.   6 Minute Walk     Row Name 09/06/23 1545 01/27/24 1117       6 Minute Walk   Phase Initial Discharge    Distance 1200 feet 1545 feet    Distance % Change -- 28.8 %    Distance Feet Change -- 345 ft    Walk Time 6 minutes 6 minutes    # of Rest Breaks 0 0    MPH 2.3 2.93    METS 3.1 3.5    RPE 11 11    Perceived Dyspnea  0 1    VO2 Peak 10.7 12.26    Symptoms No No    Resting HR 72 bpm 71 bpm    Resting BP 106/64 122/60    Resting Oxygen Saturation  96 % 94 %    Exercise Oxygen Saturation  during 6 min walk 98 % 93 %    Max Ex. HR 107 bpm 83 bpm    Max Ex. BP 114/70 126/64    2 Minute Post BP 110/68 --

## 2024-02-24 NOTE — Progress Notes (Signed)
 Daily Session Note  Patient Details  Name: Thomas Harper MRN: [id number] Date of Birth: 12-19-1961 Referring Provider:   Flowsheet Row Cardiac Rehab from 09/06/2023 in [facility]  Referring Provider Dr. Marikay Alar       Encounter Date: 02/24/2024  Check In:  Session Check In - 02/24/24 1113       Check-In   Supervising physician immediately available to respond to emergencies See telemetry face sheet for immediately available ER MD    Location [facility]-Cardiac & Pulmonary Rehab    Staff Present Susann Givens RN,BSN;Joseph Weyman [place], [place], Exercise Physiologist    Virtual Visit No    Medication changes reported     No    Fall or balance concerns reported    No    Warm-up and Cool-down Performed on first and last piece of equipment    Resistance Training Performed Yes    VAD Patient? No    PAD/SET Patient? No      Pain Assessment   Currently in Pain? No/denies                Social History   Tobacco Use  Smoking Status Former   Current packs/day: 0.00   Types: Cigarettes   Quit date: 05/21/2021   Years since quitting: 2.7  Smokeless Tobacco Former    Goals Met:  Independence with exercise equipment Exercise tolerated well No report of concerns or symptoms today Strength training completed today  Goals Unmet:  Not Applicable  Comments:  Chancellor graduated today from  rehab with 36 sessions completed.  Details of the patient's exercise prescription and what He needs to do in order to continue the prescription and progress were discussed with patient.  Patient was given a copy of prescription and goals.  Patient verbalized understanding. Thomas Harper plans to continue to exercise by using his stationary bike at home and walking.    Dr. Bethann Punches is Medical Director for [facility] Cardiac Rehabilitation.  Dr. Vida Rigger is Medical Director for [facility] Pulmonary Rehabilitation.

## 2024-02-24 NOTE — Progress Notes (Signed)
 Cardiac Individual Treatment Plan  Patient Details  Name: COURTEZ TWADDLE MRN: [id number] Date of Birth: [birth year] Referring Provider:   Flowsheet Row Cardiac Rehab from 09/06/2023 in [facility] Cardiac and Pul[facility] Rehab  Referring Provider Dr. Marikay Alar       Initial Encounter Date:  Flowsheet Row Cardiac Rehab from 09/06/2023 in [facility] Cardiac and Pulmonary Rehab  Date 09/06/23       Visit Diagnosis: S/P CABG x 3  Patient's Home Medications on Admission:  Current Outpatient Medications:    acetaminophen (TYLENOL) 500 MG tablet, Take 1,000 mg by mouth every 8 (eight) hours as needed for headache., Disp: , Rfl:    albuterol (VENTOLIN HFA) 108 (90 Base) MCG/ACT inhaler, Inhale 2 puffs into the lungs every 6 (six) hours as needed for wheezing or shortness of breath., Disp: 8 g, Rfl: 6   aspirin EC 81 MG tablet, Take 1 tablet (81 mg total) by mouth daily. Swallow whole., Disp: 90 tablet, Rfl: 3   atorvastatin (LIPITOR) 80 MG tablet, Take 1 tablet (80 mg total) by mouth daily., Disp: 30 tablet, Rfl: 11   calcium carbonate (TUMS - DOSED IN MG ELEMENTAL CALCIUM) 500 MG chewable tablet, Chew 1 tablet by mouth continuous as needed for indigestion or heartburn., Disp: , Rfl:    clopidogrel (PLAVIX) 75 MG tablet, Take 1 tablet (75 mg total) by mouth daily., Disp: 90 tablet, Rfl: 3   lansoprazole (PREVACID) 15 MG capsule, Take 15 mg by mouth daily in the afternoon., Disp: , Rfl:    metoprolol tartrate (LOPRESSOR) 50 MG tablet, Take 1 tablet (50 mg total) by mouth 2 (two) times daily., Disp: 180 tablet, Rfl: 3   pantoprazole (PROTONIX) 40 MG tablet, TAKE ONE TABLET (40 MG TOTAL) BY MOUTH DAILY. TAKE 30 MINUTES BEFORE YOUR EVENING MEAL, Disp: 90 tablet, Rfl: 3   Tiotropium Bromide-Olodaterol (STIOLTO RESPIMAT) 2.5-2.5 MCG/ACT AERS, Inhale 2 puffs into the lungs daily. (Patient not taking: Reported on 02/23/2024), Disp: , Rfl:    traMADol (ULTRAM) 50 MG tablet, Take 1 tablet (50 mg total) by mouth  every 6 (six) hours as needed., Disp: 15 tablet, Rfl: 0  Past Medical History: Past Medical History:  Diagnosis Date   Anginal pain (HCC)    Chronic leg pain    s/p injury right leg (age 53)   Coronary artery disease    GERD (gastroesophageal reflux disease)    History of ankle fracture    persistent pain   History of kidney stones    Hypercholesterolemia    Hypertension    Nephrolithiasis    followed by Assunta Gambles   Vitamin D deficiency     Tobacco Use: Social History   Tobacco Use  Smoking Status Former   Current packs/day: 0.00   Types: Cigarettes   Quit date: 05/21/2021   Years since quitting: 2.7  Smokeless Tobacco Former    Labs: Review Flowsheet  More data exists      Latest Ref Rng & Units 03/18/2023 07/11/2023 07/13/2023 07/29/2023 12/23/2023  Labs for ITP Cardiac and Pulmonary Rehab  Cholestrol 0 - 200 mg/dL 409  - - 811  914   LDL (calc) 0 - 99 mg/dL 782  - - 72  77   HDL-C >39.00 mg/dL 95.62  - - 13.08  65.78   Trlycerides 0.0 - 149.0 mg/dL 469.6  - - 295.2  841.3   Hemoglobin A1c 4.6 - 6.5 % 6.2  5.9  - - 6.7   PH, Arterial 7.35 - 7.45 -  7.42  7.355  7.358  7.338  7.444  7.362  - -  PCO2 arterial 32 - 48 mmHg - 38  37.6  40.9  45.1  32.1  44.0  - -  Bicarbonate 20.0 - 28.0 mmol/L - 24.6  21.1  23.2  24.2  22.0  25.0  - -  TCO2 22 - 32 mmol/L - - 22  25  24  26  26  27  24  23  24  27  26   - -  Acid-base deficit 0.0 - 2.0 mmol/L - - 4.0  2.0  2.0  2.0  1.0  - -  O2 Saturation % - 99  99  99  99  100  100  - -    Details       Multiple values from one day are sorted in reverse-chronological order          Exercise Target Goals: Exercise Program Goal: Individual exercise prescription set using results from initial 6 min walk test and THRR while considering  patient's activity barriers and safety.   Exercise Prescription Goal: Initial exercise prescription builds to 30-45 minutes a day of aerobic activity, 2-3 days per week.  Home exercise  guidelines will be given to patient during program as part of exercise prescription that the participant will acknowledge.   Education: Aerobic Exercise: - Group verbal and visual presentation on the components of exercise prescription. Introduces F.I.T.T principle from ACSM for exercise prescriptions.  Reviews F.I.T.T. principles of aerobic exercise including progression. Written material given at graduation.   Education: Resistance Exercise: - Group verbal and visual presentation on the components of exercise prescription. Introduces F.I.T.T principle from ACSM for exercise prescriptions  Reviews F.I.T.T. principles of resistance exercise including progression. Written material given at graduation.    Education: Exercise & Equipment Safety: - Individual verbal instruction and demonstration of equipment use and safety with use of the equipment. Flowsheet Row Cardiac Rehab from 10/12/2023 in [facility] Cardiac and Pulmonary Rehab  Date 09/06/23  Educator mc  Instruction Review Code 1- Verbalizes Understanding       Education: Exercise Physiology & General Exercise Guidelines: - Group verbal and written instruction with models to review the exercise physiology of the cardiovascular system and associated critical values. Provides general exercise guidelines with specific guidelines to those with heart or lung disease.  Flowsheet Row Cardiac Rehab from 10/12/2023 in [facility] Cardiac and Pulmonary Rehab  Date 10/12/23  Educator NT  Instruction Review Code 1- [employer] Understanding       Education: Flexibility, Balance, Mind/Body Relaxation: - Group verbal and visual presentation with interactive activity on the components of exercise prescription. Introduces F.I.T.T principle from ACSM for exercise prescriptions. Reviews F.I.T.T. principles of flexibility and balance exercise training including progression. Also discusses the mind body connection.  Reviews various relaxation techniques to help  reduce and manage stress (i.e. Deep breathing, progressive muscle relaxation, and visualization). Balance handout provided to take home. Written material given at graduation.   Activity Barriers & Risk Stratification:  Activity Barriers & Cardiac Risk Stratification - 09/06/23 1547       Activity Barriers & Cardiac Risk Stratification   Activity Barriers None    Cardiac Risk Stratification High             6 Minute Walk:  6 Minute Walk     Row Name 09/06/23 1545 01/27/24 1117       6 Minute Walk   Phase Initial Discharge    Distance  1200 feet 1545 feet    Distance % Change -- 28.8 %    Distance Feet Change -- 345 ft    Walk Time 6 minutes 6 minutes    # of Rest Breaks 0 0    MPH 2.3 2.93    METS 3.1 3.5    RPE 11 11    Perceived Dyspnea  0 1    VO2 Peak 10.7 12.26    Symptoms No No    Resting HR 72 bpm 71 bpm    Resting BP 106/64 122/60    Resting Oxygen Saturation  96 % 94 %    Exercise Oxygen Saturation  during 6 min walk 98 % 93 %    Max Ex. HR 107 bpm 83 bpm    Max Ex. BP 114/70 126/64    2 Minute Post BP 110/68 --             Oxygen Initial Assessment:   Oxygen Re-Evaluation:   Oxygen Discharge (Final Oxygen Re-Evaluation):   Initial Exercise Prescription:  Initial Exercise Prescription - 09/06/23 1500       Date of Initial Exercise RX and Referring Provider   Date 09/06/23    Referring Provider Dr. Marikay Alar      Oxygen   Maintain Oxygen Saturation 88% or higher      Treadmill   MPH 2.3    Grade 0    Minutes 15    METs 2.76      NuStep   Level 2    Minutes 15    METs 3.1      REL-XR   Level 2    Minutes 15    METs 3.1      T5 Nustep   Level 2    Minutes 15    METs 3.1      Biostep-RELP   Level 2    Minutes 15    METs 3.1      Prescription Details   Duration Progress to 30 minutes of continuous aerobic without signs/symptoms of physical distress      Intensity   THRR 40-80% of Max Heartrate 106-140     Ratings of Perceived Exertion 11-13    Perceived Dyspnea 0-4      Progression   Progression Continue to progress workloads to maintain intensity without signs/symptoms of physical distress.      Resistance Training   Training Prescription Yes    Weight 5lb    Reps 10-15             Perform Capillary Blood Glucose checks as needed.  Exercise Prescription Changes:   Exercise Prescription Changes     Row Name 09/06/23 1500 09/20/23 1400 10/07/23 1100 10/12/23 1100 10/19/23 1000     Response to Exercise   Blood Pressure (Admit) 106/64 102/58 128/72 -- 112/64   Blood Pressure (Exercise) 114/70 142/80 132/62 -- 178/82   Blood Pressure (Exit) 110/68 96/64 110/66 -- 104/60   Heart Rate (Admit) 72 bpm 86 bpm 74 bpm -- 69 bpm   Heart Rate (Exercise) 107 bpm 103 bpm 96 bpm -- 97 bpm   Heart Rate (Exit) 73 bpm 77 bpm 74 bpm -- 72 bpm   Oxygen Saturation (Admit) 96 % -- -- -- --   Oxygen Saturation (Exercise) 98 % -- -- -- --   Oxygen Saturation (Exit) 98 % -- -- -- --   Rating of Perceived Exertion (Exercise) 11 14 14  -- 13   Perceived Dyspnea (Exercise) 0 -- -- -- --  Symptoms none -- none -- none   Comments Orientation -- -- -- --   Duration Progress to 30 minutes of  aerobic without signs/symptoms of physical distress Continue with 30 min of aerobic exercise without signs/symptoms of physical distress. Continue with 30 min of aerobic exercise without signs/symptoms of physical distress. -- Continue with 30 min of aerobic exercise without signs/symptoms of physical distress.   Intensity THRR unchanged THRR unchanged THRR unchanged -- THRR unchanged     Progression   Progression Continue to progress workloads to maintain intensity without signs/symptoms of physical distress. Continue to progress workloads to maintain intensity without signs/symptoms of physical distress. Continue to progress workloads to maintain intensity without signs/symptoms of physical distress. -- Continue  to progress workloads to maintain intensity without signs/symptoms of physical distress.   Average METs 3.1 3.03 3.32 -- 3.47     Resistance Training   Training Prescription -- Yes Yes -- Yes   Weight -- 5lb 5lb -- 5 lb   Reps -- 10-15 10-15 -- 10-15     Interval Training   Interval Training -- -- No -- No     Treadmill   MPH -- 2.6 2.6 -- 2.7   Grade -- 0 2 -- 1.5   Minutes -- 15 15 -- 15   METs -- 2.99 3.71 -- 2.63     NuStep   Level -- 4 -- -- 4   Minutes -- 15 -- -- 15   METs -- 4.3 -- -- 4.7     REL-XR   Level -- 3 4 -- 3   Minutes -- 15 15 -- 15   METs -- 3.1 3.7 -- --     T5 Nustep   Level -- 3 3 -- 3   Minutes -- 15 15 -- 15   METs -- 2.3 2.1 -- 2.5     Biostep-RELP   Level -- 2 3 -- 2   Minutes -- 15 15 -- 15   METs -- 3 -- -- 4     Home Exercise Plan   Plans to continue exercise at -- -- -- Home (comment)  stationary bike, walking at home, looking into getting a gym membership Home (comment)  stationary bike, walking at home, looking into getting a gym membership   Frequency -- -- -- Add 4 additional days to program exercise sessions. Add 4 additional days to program exercise sessions.   Initial Home Exercises Provided -- -- -- 10/12/23 10/12/23     Oxygen   Maintain Oxygen Saturation -- 88% or higher 88% or higher 88% or higher 88% or higher    Row Name 11/04/23 1100 11/16/23 1500 12/01/23 1400 12/14/23 1100 12/27/23 1500     Response to Exercise   Blood Pressure (Admit) 100/60 112/66 102/58 110/60 112/64   Blood Pressure (Exit) 100/56 102/60 124/68 108/56 102/60   Heart Rate (Admit) 62 bpm 72 bpm 61 bpm 79 bpm 90 bpm   Heart Rate (Exercise) 100 bpm 106 bpm 85 bpm 95 bpm 97 bpm   Heart Rate (Exit) 70 bpm 68 bpm 60 bpm 77 bpm 64 bpm   Rating of Perceived Exertion (Exercise) 14 12 11 14 11    Symptoms none none none none none   Duration Continue with 30 min of aerobic exercise without signs/symptoms of physical distress. Continue with 30 min of aerobic  exercise without signs/symptoms of physical distress. Continue with 30 min of aerobic exercise without signs/symptoms of physical distress. Continue with 30 min of  aerobic exercise without signs/symptoms of physical distress. Continue with 30 min of aerobic exercise without signs/symptoms of physical distress.   Intensity THRR unchanged THRR unchanged THRR unchanged THRR unchanged THRR unchanged     Progression   Progression Continue to progress workloads to maintain intensity without signs/symptoms of physical distress. Continue to progress workloads to maintain intensity without signs/symptoms of physical distress. Continue to progress workloads to maintain intensity without signs/symptoms of physical distress. Continue to progress workloads to maintain intensity without signs/symptoms of physical distress. Continue to progress workloads to maintain intensity without signs/symptoms of physical distress.   Average METs 3.6 4.38 3.26 4.48 2.95     Resistance Training   Training Prescription Yes Yes Yes Yes Yes   Weight 5 lb 5 lb 5 lb 5 lb 5 lb   Reps 10-15 10-15 10-15 10-15 10-15     Interval Training   Interval Training No No No No No     Treadmill   MPH 2.3 2.6 2.6 4 2.7   Grade 2.5 2.5 2 3.5 2.5   Minutes 15 15 15 15 15    METs 3.55 3.89 3.71 5.99 4     NuStep   Level 4 5 -- -- 2   Minutes 15 15 -- -- 15   METs 3 3.9 -- -- 1.9     REL-XR   Level -- 7 -- -- --   Minutes -- 15 -- -- --   METs -- 6.1 -- -- --     T5 Nustep   Level -- -- 3 3 --   Minutes -- -- 15 15 --   METs -- -- 2.8 2.7 --     Home Exercise Plan   Plans to continue exercise at Home (comment)  stationary bike, walking at home, looking into getting a gym membership Home (comment)  stationary bike, walking at home, looking into getting a gym membership Home (comment)  stationary bike, walking at home, looking into getting a gym membership Home (comment)  stationary bike, walking at home, looking into getting a gym  membership Home (comment)  stationary bike, walking at home, looking into getting a gym membership   Frequency Add 4 additional days to program exercise sessions. Add 4 additional days to program exercise sessions. Add 4 additional days to program exercise sessions. Add 4 additional days to program exercise sessions. Add 4 additional days to program exercise sessions.   Initial Home Exercises Provided 10/12/23 10/12/23 10/12/23 10/12/23 10/12/23     Oxygen   Maintain Oxygen Saturation 88% or higher 88% or higher 88% or higher 88% or higher 88% or higher    Row Name 01/26/24 1300 02/09/24 1500           Response to Exercise   Blood Pressure (Admit) 104/58 112/60      Blood Pressure (Exit) 122/64 108/56      Heart Rate (Admit) 79 bpm 74 bpm      Heart Rate (Exercise) 97 bpm 89 bpm      Heart Rate (Exit) 74 bpm 65 bpm      Rating of Perceived Exertion (Exercise) 12 11      Perceived Dyspnea (Exercise) 0 --      Symptoms none none      Duration Continue with 30 min of aerobic exercise without signs/symptoms of physical distress. Continue with 30 min of aerobic exercise without signs/symptoms of physical distress.      Intensity THRR unchanged THRR unchanged  Progression   Progression Continue to progress workloads to maintain intensity without signs/symptoms of physical distress. Continue to progress workloads to maintain intensity without signs/symptoms of physical distress.      Average METs 4.1 4.57        Resistance Training   Training Prescription Yes Yes      Weight 5 lb 5 lb      Reps 10-15 10-15        Interval Training   Interval Training No No        Treadmill   MPH 2.7 3      Grade 4.5 3      Minutes 15 15      METs 4.74 4.54        NuStep   Level 6 5      Minutes 15 15      METs 8.8 4.6        REL-XR   Level 4 4      Minutes 15 15        T5 Nustep   Level 3 --      Minutes 15 --      METs 2.7 --        Home Exercise Plan   Plans to continue  exercise at Home (comment)  stationary bike, walking at home, looking into getting a gym membership Home (comment)  stationary bike, walking at home, looking into getting a gym membership      Frequency Add 4 additional days to program exercise sessions. Add 4 additional days to program exercise sessions.      Initial Home Exercises Provided 10/12/23 10/12/23        Oxygen   Maintain Oxygen Saturation 88% or higher 88% or higher               Exercise Comments:   Exercise Comments     Row Name 09/09/23 1121           Exercise Comments First full day of exercise!  Patient was oriented to gym and equipment including functions, settings, policies, and procedures.  Patient's individual exercise prescription and treatment plan were reviewed.  All starting workloads were established based on the results of the 6 minute walk test done at initial orientation visit.  The plan for exercise progression was also introduced and progression will be customized based on patient's performance and goals.                Exercise Goals and Review:   Exercise Goals     Row Name 09/06/23 1552             Exercise Goals   Increase Physical Activity Yes       Intervention Provide advice, education, support and counseling about physical activity/exercise needs.;Develop an individualized exercise prescription for aerobic and resistive training based on initial evaluation findings, risk stratification, comorbidities and participant's personal goals.       Expected Outcomes Long Term: Exercising regularly at least 3-5 days a week.;Long Term: Add in home exercise to make exercise part of routine and to increase amount of physical activity.;Short Term: Attend rehab on a regular basis to increase amount of physical activity.       Increase Strength and Stamina Yes       Intervention Provide advice, education, support and counseling about physical activity/exercise needs.;Develop an individualized  exercise prescription for aerobic and resistive training based on initial evaluation findings, risk stratification, comorbidities and participant's personal goals.  Expected Outcomes Long Term: Improve cardiorespiratory fitness, muscular endurance and strength as measured by increased METs and functional capacity ( );Short Term: Perform resistance training exercises routinely during rehab and add in resistance training at home;Short Term: Increase workloads from initial exercise prescription for resistance, speed, and METs.       Able to understand and use rate of perceived exertion (RPE) scale Yes       Intervention Provide education and explanation on how to use RPE scale       Expected Outcomes Long Term:  Able to use RPE to guide intensity level when exercising independently;Short Term: Able to use RPE daily in rehab to express subjective intensity level       Able to understand and use Dyspnea scale Yes       Intervention Provide education and explanation on how to use Dyspnea scale       Expected Outcomes Long Term: Able to use Dyspnea scale to guide intensity level when exercising independently;Short Term: Able to use Dyspnea scale daily in rehab to express subjective sense of shortness of breath during exertion       Knowledge and understanding of Target Heart Rate Range (THRR) Yes       Intervention Provide education and explanation of THRR including how the numbers were predicted and where they are located for reference       Expected Outcomes Long Term: Able to use THRR to govern intensity when exercising independently;Short Term: Able to use daily as guideline for intensity in rehab;Short Term: Able to state/look up THRR       Able to check pulse independently Yes       Intervention Review the importance of being able to check your own pulse for safety during independent exercise;Provide education and demonstration on how to check pulse in carotid and radial arteries.       Expected  Outcomes Long Term: Able to check pulse independently and accurately;Short Term: Able to explain why pulse checking is important during independent exercise       Understanding of Exercise Prescription Yes       Intervention Provide education, explanation, and written materials on patient's individual exercise prescription       Expected Outcomes Short Term: Able to explain program exercise prescription;Long Term: Able to explain home exercise prescription to exercise independently                Exercise Goals Re-Evaluation :  Exercise Goals Re-Evaluation     Row Name 09/09/23 1121 09/20/23 1459 10/07/23 1127 10/12/23 1124 10/19/23 1005     Exercise Goal Re-Evaluation   Exercise Goals Review Able to understand and use rate of perceived exertion (RPE) scale;Knowledge and understanding of Target Heart Rate Range (THRR);Able to understand and use Dyspnea scale;Understanding of Exercise Prescription Increase Physical Activity;Understanding of Exercise Prescription;Increase Strength and Stamina Increase Physical Activity;Understanding of Exercise Prescription;Increase Strength and Stamina Increase Physical Activity;Understanding of Exercise Prescription;Increase Strength and Stamina;Able to understand and use Dyspnea scale;Knowledge and understanding of Target Heart Rate Range (THRR);Able to check pulse independently;Able to understand and use rate of perceived exertion (RPE) scale Increase Physical Activity;Increase Strength and Stamina;Understanding of Exercise Prescription   Comments Reviewed RPE  and dyspnea scale, THR and program prescription with pt today.  Pt voiced understanding and was given a copy of goals to take home. Latham is off to a good start in the program. He has had 4 exercise sessions so far. He has increased the treadmill to a speed  of 2.6. We will continue to monitor his progress in the program. Trey Paula continues to do well in rehab. He recently increased his treadmill workload by  adding 2% incline while maintaining a speed of 2.6 mph. He also improved to level 4 on the XR and level 3 on the biostep. We will continue to monitor his progress in the program. Reviewed home exercise with pt today.  Pt plans to use his stationary bike and walk at home for exercise. He also states that he lives close to a gym and may look into getting a membership. Reviewed THR, pulse, RPE, sign and symptoms, pulse oximetery and when to call 911 or MD.  Also discussed weather considerations and indoor options.  Pt voiced understanding. Trey Paula is doing well in rehab. He has stayed consistent at level 3 on the T5 nustep and XR, level 2 on the biostep, and level 4 on the T4 nustep. He also has fluctuated his treadmill workload with a speed ranging from 2.3-2.7 mph and an incline ranging from 1.5-2.5%. We will continue to monitor his progress in the program.   Expected Outcomes Short: Use RPE daily to regulate intensity. Long: Follow program prescription in THR. Short: Continue to follow current exercise prescription and progressively increase workloads. Long: Continue exercise to improve strength and stamina. Short: Continue to progressively increase workloads. Long: Continue exercise to improve strength and stamina. Short: Add four additional day of exercise in addition to coming once a week to rehab. Long: Continue to exercise independently. Short: Try increasing to level 3 on the biostep. Long: Continue to increase overall METs and stamina.    Row Name 11/04/23 1127 11/16/23 1551 12/01/23 1424 12/14/23 1200 12/27/23 1521     Exercise Goal Re-Evaluation   Exercise Goals Review Increase Physical Activity;Increase Strength and Stamina;Understanding of Exercise Prescription Increase Physical Activity;Increase Strength and Stamina;Understanding of Exercise Prescription Increase Physical Activity;Increase Strength and Stamina;Understanding of Exercise Prescription Increase Physical Activity;Increase Strength and  Stamina;Understanding of Exercise Prescription Increase Physical Activity;Increase Strength and Stamina;Understanding of Exercise Prescription   Comments Trey Paula continues to do well in rehab, although he is only coming once a week due to work. He has continued to do well on the treadmill at a speed of 2.3 mph with a 2.5% incline. He also has stayed consistent at level 4 on the T4 nustep and using 5 lb handweights for resistance training. We will continue to monitor his progress in the program. Trey Paula continues to do well in rehab, although he is only coming once a week due to work. He increased his treadmill workload to a speed of 2.6 mph while maintaining a 2.5% incline. He also improved to level 5 on the T4 nustep and level 7 on the XR. We will continue to monitor his progress in the program. Trey Paula has only attended one session since the last review. He continues to do well on the treadmill at a speed of 2.6 mph. He also did well at level 5 on the T5 nustep and used 5 lb hand weights for resistance training as well. We will continue to monitor his progress in the program. Trey Paula is doing well in rehab. He increased his treadmill workload for one session up to a speed of 4 mph with an incline of 3.5%. He also continues to work at level 3 on the T5 nustep and use 5 lb hand weights for resistance training. He is due for his post and will look to improve on it. We will continue to  monitor his progress in the program. Trey Paula is doing well in rehab, however, he has only attended rehab once since the last review. His treadmill workload decreased back down to a speed of 2.7 mph and an incline of 2.5%. He also saw a decrease in his workload on the T4 nustep down to level 2. He is due for his post as well and will look to improve on it. We will continue to monitor his progress in the program.   Expected Outcomes Short: Continue to progressively increase workloads and exercise on days away from rehab. Long: Continue to  increase overall METs and stamina. Short: Continue to progressively increase treadmill workload and exercise on days away from rehab. Long: Continue to increase overall METs and stamina. Short: Continue to progressively increase treadmill workload and exercise on days away from rehab. Long: Continue to increase overall METs and stamina. Short: Improve on post . Long: Continue exercise to improve strength and stamina. Short: Increase workloads back up to previous levels, improve on post . Long: Continue exercise to improve strength and stamina.    Row Name 01/12/24 1610 01/26/24 1401 02/09/24 1553         Exercise Goal Re-Evaluation   Exercise Goals Review Increase Physical Activity;Increase Strength and Stamina;Understanding of Exercise Prescription Increase Physical Activity;Increase Strength and Stamina;Understanding of Exercise Prescription Increase Physical Activity;Increase Strength and Stamina;Understanding of Exercise Prescription     Comments Trey Paula was unable to attend rehab during this review session. We will attempt to reach out in order to determine his status. Trey Paula was able to return to rehab and is doing well. He was able to increase his incline on the treadmill from 2.5 to 4.5%. he was also able to increase his level on the T4 nustep from level 2 to level 6. We will continue to monitor his progress in the program. Trey Paula continues to do well in rehab and is close to graduating. He recently completed his post and improved by 28.8%! He also increased his speed on the treadmill to 3 mph while maintaining an incline of 3%. We will continue to monitor his progress until he graduates from the program.     Expected Outcomes Short: Return to rehab. Long: Continue exercise to improve strength and stamina. Short: Continue to increase workloads when appropriate, and improve post-6MWT. Long: Continue exercise to increase strength and stamina. Short: Graduate. Long: Continue to exercise  independently.              Discharge Exercise Prescription (Final Exercise Prescription Changes):  Exercise Prescription Changes - 02/09/24 1500       Response to Exercise   Blood Pressure (Admit) 112/60    Blood Pressure (Exit) 108/56    Heart Rate (Admit) 74 bpm    Heart Rate (Exercise) 89 bpm    Heart Rate (Exit) 65 bpm    Rating of Perceived Exertion (Exercise) 11    Symptoms none    Duration Continue with 30 min of aerobic exercise without signs/symptoms of physical distress.    Intensity THRR unchanged      Progression   Progression Continue to progress workloads to maintain intensity without signs/symptoms of physical distress.    Average METs 4.57      Resistance Training   Training Prescription Yes    Weight 5 lb    Reps 10-15      Interval Training   Interval Training No      Treadmill   MPH 3    Grade  3    Minutes 15    METs 4.54      NuStep   Level 5    Minutes 15    METs 4.6      REL-XR   Level 4    Minutes 15      Home Exercise Plan   Plans to continue exercise at Home (comment)   stationary bike, walking at home, looking into getting a gym membership   Frequency Add 4 additional days to program exercise sessions.    Initial Home Exercises Provided 10/12/23      Oxygen   Maintain Oxygen Saturation 88% or higher             Nutrition:  Target Goals: Understanding of nutrition guidelines, daily intake of sodium 1500mg , cholesterol 200mg , calories 30% from fat and 7% or less from saturated fats, daily to have 5 or more servings of fruits and vegetables.  Education: All About Nutrition: -Group instruction provided by verbal, written material, interactive activities, discussions, models, and posters to present general guidelines for heart healthy nutrition including fat, fiber, MyPlate, the role of sodium in heart healthy nutrition, utilization of the nutrition label, and utilization of this knowledge for meal planning. Follow up email  sent as well. Written material given at graduation.   Biometrics:  Pre Biometrics - 09/06/23 1553       Pre Biometrics   Height 5\' 5"  (1.651 m)    Weight 163 lb 8 oz (74.2 kg)    Waist Circumference 37 inches    Hip Circumference 37.5 inches    Waist to Hip Ratio 0.99 %    BMI (Calculated) 27.21    Single Leg Stand 30 seconds             Post Biometrics - 01/27/24 1119        Post  Biometrics   Height 5\' 5"  (1.651 m)    Weight 168 lb (76.2 kg)    Waist Circumference 37 inches    Hip Circumference 37 inches    Waist to Hip Ratio 1 %    BMI (Calculated) 27.96    Single Leg Stand 28.7 seconds             Nutrition Therapy Plan and Nutrition Goals:   Nutrition Assessments:  MEDIFICTS Score Key: >=70 Need to make dietary changes  40-70 Heart Healthy Diet <= 40 Therapeutic Level Cholesterol Diet  Flowsheet Row Cardiac Rehab from 09/12/2023 in [facility] Cardiac and Pulmonary Rehab  Picture Your Plate Total Score on Admission 66      Picture Your Plate Scores: <40 Unhealthy dietary pattern with much room for improvement. 41-50 Dietary pattern unlikely to meet recommendations for good health and room for improvement. 51-60 More healthful dietary pattern, with some room for improvement.  >60 Healthy dietary pattern, although there may be some specific behaviors that could be improved.    Nutrition Goals Re-Evaluation:  Nutrition Goals Re-Evaluation     Row Name 11/11/23 1119             Goals   Comment Patient deferred dietitian appointment                Nutrition Goals Discharge (Final Nutrition Goals Re-Evaluation):  Nutrition Goals Re-Evaluation - 11/11/23 1119       Goals   Comment Patient deferred dietitian appointment             Psychosocial: Target Goals: Acknowledge presence or absence of significant depression and/or stress, maximize  coping skills, provide positive support system. Participant is able to verbalize types and  ability to use techniques and skills needed for reducing stress and depression.   Education: Stress, Anxiety, and Depression - Group verbal and visual presentation to define topics covered.  Reviews how body is impacted by stress, anxiety, and depression.  Also discusses healthy ways to reduce stress and to treat/manage anxiety and depression.  Written material given at graduation. Flowsheet Row Cardiac Rehab from 10/12/2023 in [facility] Cardiac and Pulmonary Rehab  Date 10/05/23  Educator SB  Instruction Review Code 1- [employer] Understanding       Education: Sleep Hygiene -Provides group verbal and written instruction about how sleep can affect your health.  Define sleep hygiene, discuss sleep cycles and impact of sleep habits. Review good sleep hygiene tips.    Initial Review & Psychosocial Screening:   Quality of Life Scores:   Quality of Life - 09/12/23 1121       Quality of Life   Select Quality of Life      Quality of Life Scores   Health/Function Pre 21.2 %    Socioeconomic Pre 20.64 %    Psych/Spiritual Pre 22.07 %    Family Pre 19.17 %    GLOBAL Pre 21.06 %            Scores of 19 and below usually indicate a poorer quality of life in these areas.  A difference of  2-3 points is a clinically meaningful difference.  A difference of 2-3 points in the total score of the Quality of Life Index has been associated with significant improvement in overall quality of life, self-image, physical symptoms, and general health in studies assessing change in quality of life.  PHQ-9: Review Flowsheet  More data exists      09/06/2023 05/13/2023 05/02/2023 11/12/2022 01/22/2022  Depression screen PHQ 2/9  Decreased Interest 0 1 0 0 0  Down, Depressed, Hopeless 0 0 0 0 0  PHQ - 2 Score 0 1 0 0 0  Altered sleeping 2 1 0 - -  Tired, decreased energy 2 1 0 - -  Change in appetite 0 0 0 - -  Feeling bad or failure about yourself  0 0 0 - -  Trouble concentrating 0 0 0 - -  Moving  slowly or fidgety/restless 0 0 0 - -  Suicidal thoughts 0 0 0 - -  PHQ-9 Score 4 3 0 - -  Difficult doing work/chores Somewhat difficult Not difficult at all Not difficult at all - -   Interpretation of Total Score  Total Score Depression Severity:  1-4 = Minimal depression, 5-9 = Mild depression, 10-14 = Moderate depression, 15-19 = Moderately severe depression, 20-27 = Severe depression   Psychosocial Evaluation and Intervention:   Psychosocial Re-Evaluation:  Psychosocial Re-Evaluation     Row Name 11/11/23 1118             Psychosocial Re-Evaluation   Current issues with None Identified       Comments Patient reports no issues with their current mental states, sleep, stress, depression or anxiety. Will follow up with patient in a few weeks for any changes.       Expected Outcomes Short: Continue to exercise regularly to support mental health and notify staff of any changes. Long: maintain mental health and well being through teaching of rehab or prescribed medications independently.       Interventions Encouraged to attend Cardiac Rehabilitation for the exercise  Continue Psychosocial Services  Follow up required by staff                Psychosocial Discharge (Final Psychosocial Re-Evaluation):  Psychosocial Re-Evaluation - 11/11/23 1118       Psychosocial Re-Evaluation   Current issues with None Identified    Comments Patient reports no issues with their current mental states, sleep, stress, depression or anxiety. Will follow up with patient in a few weeks for any changes.    Expected Outcomes Short: Continue to exercise regularly to support mental health and notify staff of any changes. Long: maintain mental health and well being through teaching of rehab or prescribed medications independently.    Interventions Encouraged to attend Cardiac Rehabilitation for the exercise    Continue Psychosocial Services  Follow up required by staff              Vocational Rehabilitation: Provide vocational rehab assistance to qualifying candidates.   Vocational Rehab Evaluation & Intervention:   Education: Education Goals: Education classes will be provided on a variety of topics geared toward better understanding of heart health and risk factor modification. Participant will state understanding/return demonstration of topics presented as noted by education test scores.  Learning Barriers/Preferences:   General Cardiac Education Topics:  AED/CPR: - Group verbal and written instruction with the use of models to demonstrate the basic use of the AED with the basic ABC's of resuscitation.   Anatomy and Cardiac Procedures: - Group verbal and visual presentation and models provide information about basic cardiac anatomy and function. Reviews the testing methods done to diagnose heart disease and the outcomes of the test results. Describes the treatment choices: Medical Management, Angioplasty, or Coronary Bypass Surgery for treating various heart conditions including Myocardial Infarction, Angina, Valve Disease, and Cardiac Arrhythmias.  Written material given at graduation.   Medication Safety: - Group verbal and visual instruction to review commonly prescribed medications for heart and lung disease. Reviews the medication, class of the drug, and side effects. Includes the steps to properly store meds and maintain the prescription regimen.  Written material given at graduation. Flowsheet Row Cardiac Rehab from 10/12/2023 in [facility] Cardiac and Pulmonary Rehab  Date 09/14/23  Educator SB  Instruction Review Code 1- Verbalizes Understanding       Intimacy: - Group verbal instruction through game format to discuss how heart and lung disease can affect sexual intimacy. Written material given at graduation..   Know Your Numbers and Heart Failure: - Group verbal and visual instruction to discuss disease risk factors for cardiac and pulmonary  disease and treatment options.  Reviews associated critical values for Overweight/Obesity, Hypertension, Cholesterol, and Diabetes.  Discusses basics of heart failure: signs/symptoms and treatments.  Introduces Heart Failure Zone chart for action plan for heart failure.  Written material given at graduation. Flowsheet Row Cardiac Rehab from 10/12/2023 in [facility] Cardiac and Pulmonary Rehab  Date 09/28/23  Educator SB  Instruction Review Code 1- Verbalizes Understanding       Infection Prevention: - Provides verbal and written material to individual with discussion of infection control including proper hand washing and proper equipment cleaning during exercise session. Flowsheet Row Cardiac Rehab from 10/12/2023 in [facility] Cardiac and Pulmonary Rehab  Date 09/06/23  Educator mc  Instruction Review Code 1- Verbalizes Understanding       Falls Prevention: - Provides verbal and written material to individual with discussion of falls prevention and safety. Flowsheet Row Cardiac Rehab from 10/12/2023 in [facility] Cardiac and Pulmonary Rehab  Date 09/06/23  Educator mc  Instruction Review Code 1- Verbalizes Understanding       Other: -Provides group and verbal instruction on various topics (see comments)   Knowledge Questionnaire Score:  Knowledge Questionnaire Score - 09/12/23 1110       Knowledge Questionnaire Score   Pre Score 23             Core Components/Risk Factors/Patient Goals at Admission:  Personal Goals and Risk Factors at Admission - 09/06/23 1556       Core Components/Risk Factors/Patient Goals on Admission    Weight Management Yes;Weight Maintenance    Intervention Weight Management: Develop a combined nutrition and exercise program designed to reach desired caloric intake, while maintaining appropriate intake of nutrient and fiber, sodium and fats, and appropriate energy expenditure required for the weight goal.;Weight Management: Provide education and appropriate  resources to help participant work on and attain dietary goals.;Weight Management/Obesity: Establish reasonable short term and long term weight goals.    Admit Weight 163 lb 8 oz (74.2 kg)    Goal Weight: Short Term 160 lb (72.6 kg)    Goal Weight: Long Term 155 lb (70.3 kg)    Expected Outcomes Short Term: Continue to assess and modify interventions until short term weight is achieved;Long Term: Adherence to nutrition and physical activity/exercise program aimed toward attainment of established weight goal;Weight Maintenance: Understanding of the daily nutrition guidelines, which includes 25-35% calories from fat, 7% or less cal from saturated fats, less than 200mg  cholesterol, less than 1.5gm of sodium, & 5 or more servings of fruits and vegetables daily;Understanding recommendations for meals to include 15-35% energy as protein, 25-35% energy from fat, 35-60% energy from carbohydrates, less than 200mg  of dietary cholesterol, 20-35 gm of total fiber daily;Understanding of distribution of calorie intake throughout the day with the consumption of 4-5 meals/snacks    Hypertension Yes    Intervention Provide education on lifestyle modifcations including regular physical activity/exercise, weight management, moderate sodium restriction and increased consumption of fresh fruit, vegetables, and low fat dairy, alcohol moderation, and smoking cessation.;Monitor prescription use compliance.    Expected Outcomes Short Term: Continued assessment and intervention until BP is < 140/41mm HG in hypertensive participants. < 130/28mm HG in hypertensive participants with diabetes, heart failure or chronic kidney disease.;Long Term: Maintenance of blood pressure at goal levels.             Education:Diabetes - Individual verbal and written instruction to review signs/symptoms of diabetes, desired ranges of glucose level fasting, after meals and with exercise. Acknowledge that pre and post exercise glucose checks will  be done for 3 sessions at entry of program.   Core Components/Risk Factors/Patient Goals Review:   Goals and Risk Factor Review     Row Name 11/11/23 1120             Core Components/Risk Factors/Patient Goals Review   Personal Goals Review Weight Management/Obesity       Review Trey Paula states that he wants to maintain his weight at this point. He has started working and feels pretty well health wise. He was 170lbs today and wants to maintain that weight. He has no questions about his medications at this time.       Expected Outcomes Short: continue to exercise. Long: maintain weight independently                Core Components/Risk Factors/Patient Goals at Discharge (Final Review):   Goals and Risk Factor Review - 11/11/23 1120  Core Components/Risk Factors/Patient Goals Review   Personal Goals Review Weight Management/Obesity    Review Trey Paula states that he wants to maintain his weight at this point. He has started working and feels pretty well health wise. He was 170lbs today and wants to maintain that weight. He has no questions about his medications at this time.    Expected Outcomes Short: continue to exercise. Long: maintain weight independently             ITP Comments:  ITP Comments     Row Name 09/06/23 1543 09/09/23 1120 09/28/23 1258 10/26/23 1238 11/16/23 1006   ITP Comments Completed and gym orientation. Initial ITP created and sent for review to Dr. Cristal Ford, Medical Director. First full day of exercise!  Patient was oriented to gym and equipment including functions, settings, policies, and procedures.  Patient's individual exercise prescription and treatment plan were reviewed.  All starting workloads were established based on the results of the 6 minute walk test done at initial orientation visit.  The plan for exercise progression was also introduced and progression will be customized based on patient's performance and goals. 30 Day review  completed. Medical Director ITP review done, changes made as directed, and signed approval by Medical Director.    new to program 30 Day review completed. Medical Director ITP review done, changes made as directed, and signed approval by Medical Director. 30 Day review completed. Medical Director ITP review done, changes made as directed, and signed approval by Medical Director.    Row Name 12/14/23 1210 01/11/24 1323 02/08/24 0846 02/24/24 1114     ITP Comments 30 Day review completed. Medical Director ITP review done, changes made as directed, and signed approval by Medical Director. 30 Day review completed. Medical Director ITP review done, changes made as directed, and signed approval by Medical Director. 30 Day review completed. Medical Director ITP review done, changes made as directed, and signed approval by Medical Director. Benson graduated today from  rehab with 36 sessions completed.  Details of the patient's exercise prescription and what He needs to do in order to continue the prescription and progress were discussed with patient.  Patient was given a copy of prescription and goals.  Patient verbalized understanding. Kelley plans to continue to exercise by using his stationary bike at home and walking.             Comments: Discharge ITP

## 2024-03-02 ENCOUNTER — Ambulatory Visit: Payer: Self-pay

## 2024-03-09 ENCOUNTER — Ambulatory Visit: Payer: Self-pay

## 2024-03-09 ENCOUNTER — Ambulatory Visit: Payer: Self-pay | Admitting: Internal Medicine

## 2024-03-09 ENCOUNTER — Ambulatory Visit: Admitting: Family

## 2024-03-09 ENCOUNTER — Other Ambulatory Visit: Payer: Self-pay | Admitting: Internal Medicine

## 2024-03-09 ENCOUNTER — Ambulatory Visit: Payer: 59 | Admitting: Internal Medicine

## 2024-03-09 ENCOUNTER — Encounter: Payer: Self-pay | Admitting: Family

## 2024-03-09 ENCOUNTER — Other Ambulatory Visit: Payer: Self-pay | Admitting: Family

## 2024-03-09 VITALS — BP 110/78 | HR 70 | Temp 98.2°F | Ht 66.0 in | Wt 166.0 lb

## 2024-03-09 DIAGNOSIS — R051 Acute cough: Secondary | ICD-10-CM

## 2024-03-09 DIAGNOSIS — U071 COVID-19: Secondary | ICD-10-CM | POA: Diagnosis not present

## 2024-03-09 LAB — POCT INFLUENZA A/B
Influenza A, POC: NEGATIVE
Influenza B, POC: NEGATIVE

## 2024-03-09 LAB — POC COVID19 BINAXNOW: SARS Coronavirus 2 Ag: POSITIVE — AB

## 2024-03-09 MED ORDER — NIRMATRELVIR/RITONAVIR (PAXLOVID)TABLET: 3.0000 | ORAL_TABLET | Freq: Two times a day (BID) | ORAL | 0 refills | Status: AC

## 2024-03-09 MED ORDER — PROMETHAZINE-DM 6.25-15 MG/5ML PO SYRP: 5.0000 mL | ORAL_SOLUTION | Freq: Four times a day (QID) | ORAL | 0 refills | Status: DC | PRN

## 2024-03-09 MED ORDER — PREDNISONE 20 MG PO TABS: 40.0000 mg | ORAL_TABLET | Freq: Every day | ORAL | 0 refills | Status: DC

## 2024-03-09 MED ORDER — NIRMATRELVIR/RITONAVIR (PAXLOVID)TABLET: 3.0000 | ORAL_TABLET | Freq: Two times a day (BID) | ORAL | 0 refills | Status: DC

## 2024-03-09 NOTE — Telephone Encounter (Signed)
 Noted.
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
## 2024-03-09 NOTE — Telephone Encounter (Signed)
 Copied from CRM [id number]. Topic: Clinical - Medication Refill >> Mar 09, 2024  5:08 PM Eunice Blase wrote: Most Recent Primary Care Visit:  Provider: [facility]  Department: [facility]  Visit Type: LAB  Date: 12/23/2023  Medication: nirmatrelvir/ritonavir (PAXLOVID) 20 x 150 MG & 10 x 100MG  TABS  Has the patient contacted their pharmacy? Yes (Agent: If no, request that the patient contact the pharmacy for the refill. If patient does not wish to contact the pharmacy document the reason why and proceed with request.) (Agent: If yes, when and what did the pharmacy advise?)Pharmacy need script and approval  Is this the correct pharmacy for this prescription? Yes If no, delete pharmacy and type the correct one.  This is the patient's preferred pharmacy:    [facility] [address] [place] - [address] [place] [address] Phone: [phone] Fax: [phone]   Has the prescription been filled recently? Yes  Is the patient out of the medication? Yes  Has the patient been seen for an appointment in the last year OR does the patient have an upcoming appointment? Yes  Can we respond through MyChart? Yes  Agent: Please be advised that Rx refills may take up to 3 business days. We ask that you follow-up with your pharmacy.

## 2024-03-09 NOTE — Telephone Encounter (Signed)
 Chief Complaint: cough Symptoms: cough, congestion, clear mucus, sneezing, fever Frequency: 2 days Pertinent Negatives: Patient denies CP, SOB, syncope, heart palpitations, diaphoresis Disposition: [] ED /[] Urgent Care (no appt availability in office) / [x] Appointment(In office/virtual)/ []  [facility]/ [] Home Care/ [] Refused Recommended Disposition /[] [facility]/ []  Follow-up with PCP Additional Notes: Pt reports productive cough with clear mucus, sneezing, and fever for 2 days. States he believes he got sick from a coworker. Pt states he has not taken his temperature but his forehead feels warm and he's been having chills. Pt states his cough is severe and it is keeping him awake at night. Pt states he coughed so much yesterday AM that he vomited, now he endorses "a little" nausea. Denies SOB. Pt had a CABG in July of 2024. Pt denies CP. Pt does endorse lightheadedness when he stands up.  Denies syncope, palpitations, diaphoresis. RN advised pt he should be seen today for symptoms. At that time, pt told RN he had an 1140 appt scheduled for today at [facility] prior to the call. RN advised pt that if he develops COP, CP, palpitations, vomiting, weakness, diaphoresis before that appt he needs to call 911. Pt verbalized understanding.   Copied from CRM [id number]. Topic: Clinical - Pink Word Triage >> Mar 09, 2024  7:37 AM Thomas Harper wrote: Reason for Triage: Fever, cough, congestion, sneezing. Patient had open heart surgery, wearing device for Afib monitoring.  Thomas Harper [phone] Reason for Disposition  SEVERE coughing spells (e.g., whooping sound after coughing, vomiting after coughing)  Answer Assessment - Initial Assessment Questions 1. ONSET: "When did the cough begin?"      2 days ago 2. SEVERITY: "How bad is the cough today?"      Coughing with standing - been keeping him awake 3. SPUTUM: "Describe the color of your sputum" (none, dry cough; clear, white,  yellow, green)     Clear 4. HEMOPTYSIS: "Are you coughing up any blood?" If so ask: "How much?" (flecks, streaks, tablespoons, etc.)     No 5. DIFFICULTY BREATHING: "Are you having difficulty breathing?" If Yes, ask: "How bad is it?" (e.g., mild, moderate, severe)    - MILD: No SOB at rest, mild SOB with walking, speaks normally in sentences, can lie down, no retractions, pulse < 100.    - MODERATE: SOB at rest, SOB with minimal exertion and prefers to sit, cannot lie down flat, speaks in phrases, mild retractions, audible wheezing, pulse 100-120.    - SEVERE: Very SOB at rest, speaks in single words, struggling to breathe, sitting hunched forward, retractions, pulse > 120      "No, not really" 6. FEVER: "Do you have a fever?" If Yes, ask: "What is your temperature, how was it measured, and when did it start?"     "I didn't take it, I can just tell", endorses chills 7. CARDIAC HISTORY: "Do you have any history of heart disease?" (e.g., heart attack, congestive heart failure)      Triple bypass - July 2024, HTN, CAD 8. LUNG HISTORY: "Do you have any history of lung disease?"  (e.g., pulmonary embolus, asthma, emphysema)     Sleep apnea 9. PE RISK FACTORS: "Do you have a history of blood clots?" (or: recent major surgery, recent prolonged travel, bedridden)     CABG back in July 2024 10. OTHER SYMPTOMS: "Do you have any other symptoms?" (e.g., runny nose, wheezing, chest pain)       Fever, cough, congestion, sneezing. Pt states  he vomited yesterday AM. Pt states he vomited after coughing. Endorses "a little" nausea. "I haven't been eating much" "not using the bathroom much" (Bms), loss of appetite since yesterday, ate oatmeal and a sandwich yesterday, ate a sandwich today. Dizziness and lightheadedness with standing. Coughing so much it's hurting. Denies diaphoresis, palpitations, syncope, CP. States he got sick from a coworker.  Protocols used: Cough - Acute Productive-A-AH

## 2024-03-09 NOTE — Telephone Encounter (Signed)
  Additional Notes: This Triage RN attempted to contact the patient at this time. No answer, left a voicemail.

## 2024-03-09 NOTE — Telephone Encounter (Unsigned)
 Copied from CRM [id number]. Topic: Clinical - Prescription Issue >> Mar 09, 2024  3:39 PM Elizebeth Brooking wrote: Reason for CRM: Patient wife called in stating that the prescription  nirmatrelvir/ritonavir (PAXLOVID) 20 x 150 MG & 10 x 100MG  TABS was not available at that pharmacy was informed that it was available at [facility]  [address] [place], [place] [address] , would like prescription sent there

## 2024-03-09 NOTE — Telephone Encounter (Signed)
  Patient called, left VM to return the call to the office to speak to NT.      Copied From CRM [id number]. Reason for Triage: Fever, cough, congestion, sneezing. Patient had open heart surgery, wearing device for Afib monitoring.  Nnamdi [phone]

## 2024-04-18 DIAGNOSIS — I483 Typical atrial flutter: Secondary | ICD-10-CM

## 2024-04-27 ENCOUNTER — Other Ambulatory Visit (HOSPITAL_COMMUNITY): Payer: Self-pay

## 2024-04-27 ENCOUNTER — Telehealth: Payer: Self-pay | Admitting: Pharmacist

## 2024-04-27 ENCOUNTER — Telehealth: Payer: Self-pay

## 2024-04-27 ENCOUNTER — Ambulatory Visit: Payer: 59 | Attending: Cardiology | Admitting: Pharmacist

## 2024-04-27 ENCOUNTER — Encounter: Payer: Self-pay | Admitting: Pharmacist

## 2024-04-27 DIAGNOSIS — E7800 Pure hypercholesterolemia, unspecified: Secondary | ICD-10-CM

## 2024-04-27 DIAGNOSIS — I25119 Atherosclerotic heart disease of native coronary artery with unspecified angina pectoris: Secondary | ICD-10-CM | POA: Diagnosis not present

## 2024-04-27 NOTE — Telephone Encounter (Signed)
 Pharmacy Patient Advocate Encounter  Received notification from [employer] that Prior Authorization for NEXLETOL has been APPROVED from 04/27/24 to 04/27/25. Ran test claim, Copay is $25. This test claim was processed through [facility]- copay amounts may vary at other pharmacies due to pharmacy/plan contracts, or as the patient moves through the different stages of their insurance plan.

## 2024-04-27 NOTE — Telephone Encounter (Signed)
 PA request has been Submitted. New Encounter has been or will be created for follow up. For additional info see Pharmacy Prior Auth telephone encounter from 04/27/24.

## 2024-04-27 NOTE — Telephone Encounter (Signed)
 Please complete PA for Nexletol 180mg  daily

## 2024-04-27 NOTE — Telephone Encounter (Signed)
 Pharmacy Patient Advocate Encounter   Received notification from Physician's Office that prior authorization for NEXLETOL is required/requested.   Insurance verification completed.   The patient is insured through [facility] .   Per test claim: PA required; PA submitted to above mentioned insurance via CoverMyMeds Key/confirmation #/EOC [id number] Status is pending

## 2024-04-27 NOTE — Patient Instructions (Addendum)
 It was nice meeting you two today  We would like your LDL (bad cholesterol) to be less than 55  Please continue your atorvastatin  80mg  once daily  The medication we discussed today is called Nexletol  I will complete the prior authorization for you and contact you when it is approved  Try to work on your diet. Increase your fruits and vegetables, lean proteins, healthy fats, and whole grains  We will recheck your fasting lipid panel in about 2-3 months  I will also place an order for a uric acid level in about 2 weeks  Chris Dorthea Maina, PharmD, BCACP, CDCES, CPP [address] [place], [place], [address] Phone: [phone], Fax: [phone]

## 2024-04-27 NOTE — Progress Notes (Signed)
 Patient ID: SUMAN PADGET                 DOB: [birth year]                    MRN: [id number]     HPI: Thomas Harper is a 64 y.o. male patient referred to lipid clinic by Dr Alda Amas. PMH is significant for CABG x 3, HTN, HLD, OSA, and history of smoking. Patient intolerant to PCSK9i.  Patient presents today with wife. Married 3 years ago and now living together. After CABG surgery wife is trying to make him eat healthier. Quit smoking 3 years ago as well.  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 Unable to tolerate Repatha  last year as it made his joints and muscles hurt badly and he had to take time off from work. Previously also intolerant to Zetia . Was managed in the past on lovastatin  but switched to atorvastatin  80mg  last year which he is able to tolerate.  Previously ate fast foods and processed foods especially at work. Wife is now trying to make his lunch for him everyday and introducing whole grains and unsaturated fats.   Current Medications:  Atorvastatin  80mg  daily  Intolerances:  Repatha  Zetia  Lovastatin   Risk Factors:  CAD CABG x 3 History of smoking  LDL goal: <55   Labs: TC 144, Trigs 16, HDL 40.3, LDL 77 (12/23/23)  Imaging: IMPRESSION: 1. Coronary calcium  score of 434. This was 86th percentile for age,sex, and race matched control.   2. Findings are concerning for a left atrial myxoma. Difficult comparison to prior study; left atrial mass is much larger on this, dedicated, study.   3. CAD-RADS 4 Severe stenosis. (70-99% or > 50% left main). Cardiac catheterization or CT FFR is recommended. Consider symptom-guided anti-ischemic pharmacotherapy as well as risk factor modification per guideline directed care.  Past Medical History:  Diagnosis Date   Anginal pain (HCC)    Chronic leg pain    s/p injury right leg (age 6)   Coronary artery disease    GERD (gastroesophageal reflux disease)    History of ankle fracture    persistent pain    History of kidney stones    Hypercholesterolemia    Hypertension    Nephrolithiasis    followed by [facility]   Vitamin D  deficiency     Current Outpatient Medications on File Prior to Visit  Medication Sig Dispense Refill   acetaminophen  (TYLENOL ) 500 MG tablet Take 1,000 mg by mouth every 8 (eight) hours as needed for headache.     albuterol  (VENTOLIN  HFA) 108 (90 Base) MCG/ACT inhaler Inhale 2 puffs into the lungs every 6 (six) hours as needed for wheezing or shortness of breath. 8 g 6   aspirin  EC 81 MG tablet Take 1 tablet (81 mg total) by mouth daily. Swallow whole. 90 tablet 3   atorvastatin  (LIPITOR) 80 MG tablet Take 1 tablet (80 mg total) by mouth daily. 30 tablet 11   clopidogrel  (PLAVIX ) 75 MG tablet Take 1 tablet (75 mg total) by mouth daily. 90 tablet 3   lansoprazole (PREVACID) 15 MG capsule Take 15 mg by mouth daily in the afternoon.     metoprolol  tartrate (LOPRESSOR ) 50 MG tablet Take 1 tablet (50 mg total) by mouth 2 (two) times daily. 180 tablet 3   pantoprazole  (PROTONIX ) 40 MG tablet TAKE ONE TABLET (40 MG TOTAL) BY MOUTH DAILY. TAKE 30 MINUTES BEFORE YOUR EVENING MEAL  90 tablet 3   predniSONE  (DELTASONE ) 20 MG tablet Take 2 tablets (40 mg total) by mouth daily with breakfast. 10 tablet 0   promethazine -dextromethorphan (PROMETHAZINE -DM) 6.25-15 MG/5ML syrup Take 5 mLs by mouth 4 (four) times daily as needed. 118 mL 0   Tiotropium Bromide-Olodaterol (STIOLTO RESPIMAT ) 2.5-2.5 MCG/ACT AERS Inhale 2 puffs into the lungs daily. (Patient not taking: Reported on 03/09/2024)     traMADol  (ULTRAM ) 50 MG tablet Take 1 tablet (50 mg total) by mouth every 6 (six) hours as needed. 15 tablet 0   No current facility-administered medications on file prior to visit.    Allergies  Allergen Reactions   Fish Oil Rash   Lisinopril  Other (See Comments)    Fatigue   Simvastatin Rash    Assessment/Plan:  1. Hyperlipidemia - Patient;s last LDL of 77 is above goal of <55. Aggressive  goal due to CAD, elevated coronary calcium  score, CABG, and history of smoking.   Reviewed options for lowering LDL cholesterol, including PCSK9 inhibitors, Nexletol, and Leqvio. Discussed efficacy, dosing, side effects, and copay information. Patient does not wish to retry PCSK9i and is hesitant to start Leqvio due to possible similar side effects. Is willing to try Nexletol 180mg  daily. Will complete PA and place orders for recheck fasting lipid panel in 3 months and uric acid level in 1 month. Patient voiced understanding.  Continue atorvastatin  80mg  daily Start Nexletol 180mg  daily Recheck lipid panel in 3 months Check uric acid level in 1 month F/u as needed  Chris Trevione Wert, PharmD, BCACP, CDCES, CPP [address], [address] [place], [place], [address] Phone: [phone], Fax: [phone]

## 2024-05-01 MED ORDER — NEXLETOL 180 MG PO TABS: 1.0000 | ORAL_TABLET | Freq: Every day | ORAL | 5 refills | Status: DC

## 2024-05-01 NOTE — Addendum Note (Signed)
 Addended by: Sunny English on: 05/01/2024 02:58 PM   Modules accepted: Orders

## 2024-05-03 ENCOUNTER — Other Ambulatory Visit: Payer: Self-pay | Admitting: *Deleted

## 2024-05-03 ENCOUNTER — Encounter: Payer: Self-pay | Admitting: *Deleted

## 2024-05-03 MED ORDER — METOPROLOL SUCCINATE ER 100 MG PO TB24: 100.0000 mg | ORAL_TABLET | Freq: Every day | ORAL | 3 refills | Status: DC

## 2024-05-04 ENCOUNTER — Encounter: Payer: Self-pay | Admitting: Internal Medicine

## 2024-05-04 ENCOUNTER — Ambulatory Visit: Admitting: Internal Medicine

## 2024-05-04 VITALS — BP 134/82 | HR 84 | Ht 66.0 in | Wt 169.2 lb

## 2024-05-04 DIAGNOSIS — E559 Vitamin D deficiency, unspecified: Secondary | ICD-10-CM

## 2024-05-04 DIAGNOSIS — R739 Hyperglycemia, unspecified: Secondary | ICD-10-CM

## 2024-05-04 DIAGNOSIS — I10 Essential (primary) hypertension: Secondary | ICD-10-CM

## 2024-05-04 DIAGNOSIS — Z951 Presence of aortocoronary bypass graft: Secondary | ICD-10-CM

## 2024-05-04 DIAGNOSIS — K219 Gastro-esophageal reflux disease without esophagitis: Secondary | ICD-10-CM

## 2024-05-04 DIAGNOSIS — D75839 Thrombocytosis, unspecified: Secondary | ICD-10-CM | POA: Diagnosis not present

## 2024-05-04 DIAGNOSIS — I25119 Atherosclerotic heart disease of native coronary artery with unspecified angina pectoris: Secondary | ICD-10-CM

## 2024-05-04 DIAGNOSIS — E7800 Pure hypercholesterolemia, unspecified: Secondary | ICD-10-CM

## 2024-05-04 NOTE — Telephone Encounter (Signed)
 Wife is returning call to review medication instructions. Her call may be returned to [phone].
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
## 2024-05-06 ENCOUNTER — Encounter: Payer: Self-pay | Admitting: Internal Medicine

## 2024-05-06 NOTE — Assessment & Plan Note (Signed)
 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 Continue risk factor modification. Continue plavix , metoprolol  and lipitor. Continue f/u with cardiology. Discussed recent medication  changes/additions.

## 2024-05-06 NOTE — Assessment & Plan Note (Signed)
 Continues on protonix . Appears to be controlling upper symptoms. Follow.

## 2024-05-06 NOTE — Assessment & Plan Note (Signed)
 Continue f/u with cardiology. Stable. Continue risk factor modification.

## 2024-05-06 NOTE — Assessment & Plan Note (Signed)
 Follow cbc.

## 2024-05-06 NOTE — Assessment & Plan Note (Signed)
 Low-carb diet and exercise.  Follow met b and A1c.

## 2024-05-06 NOTE — Assessment & Plan Note (Signed)
 On high dose lipitor. Did not tolerate repatha .  Continue lipitor. Low cholesterol diet and exercise.  Follow lipid panel and liver function tests. Recently cardiology recommended adding nexletol .

## 2024-05-06 NOTE — Assessment & Plan Note (Signed)
Check vitamin d level

## 2024-05-06 NOTE — Assessment & Plan Note (Signed)
 Blood pressure as outlined.  Recent recommendation to change metoprolol  to toprol  XL 100mg  q day. Follow pressures. Follow metabolic panel.

## 2024-05-07 ENCOUNTER — Other Ambulatory Visit: Payer: Self-pay | Admitting: *Deleted

## 2024-05-07 MED ORDER — METOPROLOL SUCCINATE ER 100 MG PO TB24
100.0000 mg | ORAL_TABLET | Freq: Every day | ORAL | 3 refills | Status: AC
Start: 2024-05-07 — End: 2024-10-26

## 2024-05-07 NOTE — Progress Notes (Signed)
 Called patient and Toprol  XL 100 mg sent to [facility] as requested by patient.  Prescription for Toprol  Xl sent to [facility] prescription  was cancelled per patient request.

## 2024-05-18 ENCOUNTER — Telehealth: Payer: Self-pay

## 2024-05-18 ENCOUNTER — Ambulatory Visit: Admitting: Internal Medicine

## 2024-05-18 ENCOUNTER — Encounter: Payer: Self-pay | Admitting: Internal Medicine

## 2024-05-18 VITALS — BP 130/86 | HR 75 | Ht 66.0 in | Wt 167.6 lb

## 2024-05-18 DIAGNOSIS — R21 Rash and other nonspecific skin eruption: Secondary | ICD-10-CM

## 2024-05-18 DIAGNOSIS — I10 Essential (primary) hypertension: Secondary | ICD-10-CM

## 2024-05-18 MED ORDER — METHYLPREDNISOLONE 4 MG PO TBPK: ORAL_TABLET | ORAL | 0 refills | Status: DC

## 2024-05-18 MED ORDER — TRIAMCINOLONE ACETONIDE 0.1 % EX CREA: 1.0000 | TOPICAL_CREAM | Freq: Two times a day (BID) | CUTANEOUS | 0 refills | Status: DC
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
## 2024-05-18 NOTE — Telephone Encounter (Signed)
 Copied from CRM [id number]. Topic: General - Other >> May 18, 2024  8:25 AM Howard Macho wrote: Reason for CRM: patient called stating he got into some poison ivy last week and want to see if the doctor can prescribe prednisone  to him. CB [phone]

## 2024-05-18 NOTE — Telephone Encounter (Signed)
 Spoke with pt and he stated he would be here.

## 2024-05-18 NOTE — Telephone Encounter (Signed)
 Can work in today 4:00 -

## 2024-05-18 NOTE — Telephone Encounter (Signed)
 Need to know what area of body involved. Is the rash localized?  Has he ever taken prednisone ?  Need to clarify a few things. Usually don't send in prednisone  without seeing. Confirm he is doing ok - no other symptoms.

## 2024-05-18 NOTE — Telephone Encounter (Signed)
 Spoke with pt and he stated that he was mowing last week and ran into some poison ivy. He stated he got it on his legs, arms and chest and it spreads a little each day. He stated that it is very itchy. Pt stated that he has used prednisone  in the past and never had trouble taking it. He has not tried any OTC creams but stated that he knows his body well and the creams by themselves will not work. Pt is having no other symptoms.

## 2024-05-21 ENCOUNTER — Encounter: Payer: Self-pay | Admitting: Internal Medicine

## 2024-05-21 NOTE — Assessment & Plan Note (Signed)
 Appears to be c/w contact dermatitis. Was mowing. Concerned regarding poison ivy exposure. Treat with medrol dose pak - 6 day taper. Triamcinolone cream to have if needed and reexposure.

## 2024-05-21 NOTE — Assessment & Plan Note (Signed)
 On toprol  XL 100mg  now. Follow pressures.

## 2024-05-31 ENCOUNTER — Other Ambulatory Visit: Payer: Self-pay | Admitting: Cardiovascular Disease

## 2024-05-31 ENCOUNTER — Other Ambulatory Visit: Payer: Self-pay | Admitting: Internal Medicine

## 2024-06-01 ENCOUNTER — Other Ambulatory Visit (INDEPENDENT_AMBULATORY_CARE_PROVIDER_SITE_OTHER)

## 2024-06-01 DIAGNOSIS — R739 Hyperglycemia, unspecified: Secondary | ICD-10-CM

## 2024-06-01 DIAGNOSIS — I10 Essential (primary) hypertension: Secondary | ICD-10-CM | POA: Diagnosis not present

## 2024-06-01 DIAGNOSIS — D75839 Thrombocytosis, unspecified: Secondary | ICD-10-CM

## 2024-06-01 DIAGNOSIS — E7800 Pure hypercholesterolemia, unspecified: Secondary | ICD-10-CM

## 2024-06-01 LAB — HEPATIC FUNCTION PANEL
ALT: 42 U/L (ref 0–53)
AST: 33 U/L (ref 0–37)
Albumin: 4.3 g/dL (ref 3.5–5.2)
Alkaline Phosphatase: 52 U/L (ref 39–117)
Bilirubin, Direct: 0.1 mg/dL (ref 0.0–0.3)
Total Bilirubin: 0.6 mg/dL (ref 0.2–1.2)
Total Protein: 6.9 g/dL (ref 6.0–8.3)

## 2024-06-01 LAB — LIPID PANEL
Cholesterol: 131 mg/dL (ref 0–200)
HDL: 36.3 mg/dL — ABNORMAL LOW (ref >39.00)
LDL Cholesterol: 66 mg/dL (ref 0–99)
NonHDL: 94.92
Total CHOL/HDL Ratio: 4
Triglycerides: 145 mg/dL (ref 0.0–149.0)
VLDL: 29 mg/dL (ref 0.0–40.0)

## 2024-06-01 LAB — CBC WITH DIFFERENTIAL/PLATELET
Basophils Absolute: 0 10*3/uL (ref 0.0–0.1)
Basophils Relative: 0.7 % (ref 0.0–3.0)
Eosinophils Absolute: 0.1 10*3/uL (ref 0.0–0.7)
Eosinophils Relative: 2 % (ref 0.0–5.0)
HCT: 42.5 % (ref 39.0–52.0)
Hemoglobin: 14.1 g/dL (ref 13.0–17.0)
Lymphocytes Relative: 20.6 % (ref 12.0–46.0)
Lymphs Abs: 1.3 10*3/uL (ref 0.7–4.0)
MCHC: 33.2 g/dL (ref 30.0–36.0)
MCV: 89.4 fl (ref 78.0–100.0)
Monocytes Absolute: 0.6 10*3/uL (ref 0.1–1.0)
Monocytes Relative: 9.2 % (ref 3.0–12.0)
Neutro Abs: 4.2 10*3/uL (ref 1.4–7.7)
Neutrophils Relative %: 67.5 % (ref 43.0–77.0)
Platelets: 285 10*3/uL (ref 150.0–400.0)
RBC: 4.75 Mil/uL (ref 4.22–5.81)
RDW: 14 % (ref 11.5–15.5)
WBC: 6.2 10*3/uL (ref 4.0–10.5)

## 2024-06-01 LAB — BASIC METABOLIC PANEL WITH GFR
BUN: 21 mg/dL (ref 6–23)
CO2: 29 meq/L (ref 19–32)
Calcium: 9.4 mg/dL (ref 8.4–10.5)
Chloride: 102 meq/L (ref 96–112)
Creatinine, Ser: 1.01 mg/dL (ref 0.40–1.50)
GFR: 79.42 mL/min (ref >60.00)
Glucose, Bld: 97 mg/dL (ref 70–99)
Potassium: 4.5 meq/L (ref 3.5–5.1)
Sodium: 137 meq/L (ref 135–145)

## 2024-06-01 LAB — HEMOGLOBIN A1C: Hgb A1c MFr Bld: 6.5 % (ref 4.6–6.5)

## 2024-06-07 LAB — TSH: TSH: 1.87 u[IU]/mL (ref 0.35–5.50)

## 2024-06-08 ENCOUNTER — Ambulatory Visit: Payer: Self-pay | Admitting: Internal Medicine

## 2024-06-25 ENCOUNTER — Telehealth: Payer: Self-pay

## 2024-06-25 NOTE — Telephone Encounter (Signed)
   Patient Name: LAWERANCE MATSUO  DOB: 06-Mar-1961 MRN: [id number]  Primary Cardiologist: Lonni LITTIE Nanas, MD  Chart reviewed as part of pre-operative protocol coverage.    Simple dental extractions (i.e. 1-2 teeth) are considered low risk procedures per guidelines and generally do not require any specific cardiac clearance. It is also generally accepted that for simple extractions and dental cleanings, there is no need to interrupt blood thinner therapy.   I will route this recommendation to the requesting party via Epic fax function and remove from pre-op pool.  Please call with questions.  Wyn Raddle, Jackee Shove, NP 06/25/2024, 5:31 PM

## 2024-06-25 NOTE — Telephone Encounter (Signed)
   Pre-operative Risk Assessment    Patient Name: Thomas Harper  DOB: [birth year] MRN: [id number]   Date of last office visit: 02/23/24 LONNI NANAS, MD Date of next office visit: NONE  Request for Surgical Clearance    Procedure:  CLEANING AND 6 FILLINGS AND 1 CROWN  Date of Surgery:  Clearance TBD                                Surgeon:  DR SWAZILAND THOMAS Surgeon's Group or Practice Name:  [facility] Phone number:  [phone] Fax number:  [phone]   Type of Clearance Requested:   - Medical  - Pharmacy:  Hold Aspirin  and Clopidogrel  (Plavix )     Type of Anesthesia:  Local (WITH EPINEPHRINE )   Additional requests/questions:    Signed, Lucie DELENA Ku   06/25/2024, 5:25 PM

## 2024-06-27 ENCOUNTER — Telehealth: Payer: Self-pay

## 2024-06-27 NOTE — Telephone Encounter (Signed)
 We received a Medical Clearance for Dental Treatment via fax today from [facility].  I sent a copy to Dr. Allena Scott's folder on the S drive and placed a copy in Dr. Freda mailbox (her nurse, Sueanne Shallow, LPN, is not in the office at this time).

## 2024-07-20 NOTE — Telephone Encounter (Unsigned)
 Copied from CRM [id number]. Topic: Appointments - Appointment Scheduling >> Jul 20, 2024 12:21 PM Franky GRADE wrote: Patient/patient representative is calling to schedule an appointment. Refer to attachments for appointment information. Patient is returning a call he received from [place] to schedule an appointment for medical clearance; however, Dr.Scott does not have an opening until September.

## 2024-07-20 NOTE — Telephone Encounter (Signed)
 LM to let [facility] know that cardiology will need to clear him

## 2024-07-20 NOTE — Telephone Encounter (Signed)
 LM for patient. Needs appt with Dr Glendia for medical clearance soon.

## 2024-07-20 NOTE — Telephone Encounter (Signed)
 Spoke with Domenica at [facility]. They have received clearance from cardio but would still like to have medical clearance.

## 2024-07-20 NOTE — Telephone Encounter (Signed)
 Pt is going to do medical clearance at his appt on 8/29 unless something comes available sooner. While on the phone he mentioned that he has poison ivy on his arms. He has cream to put on it but it is not really helping it. Advised he may need to go to urgent care. Pt declined and asked for appt beginning of next week. He has been scheduled for Tuesday. Advised if he gets worse, needs to be seen over the weekend. Patient gave verbal understanding.

## 2024-07-20 NOTE — Telephone Encounter (Unsigned)
 Copied from CRM [id number]. Topic: Clinical - Medical Advice >> Jul 19, 2024  4:56 PM Chasity T wrote: Reason for CRM: Vertell from [facility] is calling to follow up on medical clearance for dental treatment. Please contact her back at [phone] to speak regarding clearance.

## 2024-07-24 ENCOUNTER — Encounter: Payer: Self-pay | Admitting: Nurse Practitioner

## 2024-07-24 ENCOUNTER — Ambulatory Visit: Admitting: Nurse Practitioner

## 2024-07-24 VITALS — BP 118/76 | HR 91 | Temp 97.8°F | Ht 66.0 in | Wt 171.2 lb

## 2024-07-24 DIAGNOSIS — L2389 Allergic contact dermatitis due to other agents: Secondary | ICD-10-CM | POA: Diagnosis not present

## 2024-07-24 MED ORDER — TRIAMCINOLONE ACETONIDE 0.1 % EX CREA: 1.0000 | TOPICAL_CREAM | Freq: Two times a day (BID) | CUTANEOUS | 0 refills | Status: DC

## 2024-07-24 MED ORDER — METHYLPREDNISOLONE 4 MG PO TBPK
ORAL_TABLET | ORAL | 0 refills | Status: DC
Start: 2024-07-24 — End: 2024-08-24
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
## 2024-08-01 DIAGNOSIS — L2389 Allergic contact dermatitis due to other agents: Secondary | ICD-10-CM | POA: Insufficient documentation

## 2024-08-01 NOTE — Assessment & Plan Note (Signed)
 Allergic contact dermatitis from poison ivy exposure persists with pruritus and blistering. Previous topical treatment ineffective. Slight improvement noted, but further intervention required. - Prescribed low-dose prednisone  for inflammation and pruritus. - Sent prescription for triamcinolone  cream to [facility].

## 2024-08-17 ENCOUNTER — Encounter: Admitting: Internal Medicine

## 2024-08-24 ENCOUNTER — Ambulatory Visit (INDEPENDENT_AMBULATORY_CARE_PROVIDER_SITE_OTHER): Admitting: Internal Medicine

## 2024-08-24 ENCOUNTER — Encounter: Payer: Self-pay | Admitting: Internal Medicine

## 2024-08-24 VITALS — BP 118/78 | HR 86 | Temp 97.5°F | Ht 66.0 in | Wt 170.0 lb

## 2024-08-24 DIAGNOSIS — Z800 Family history of malignant neoplasm of digestive organs: Secondary | ICD-10-CM

## 2024-08-24 DIAGNOSIS — K219 Gastro-esophageal reflux disease without esophagitis: Secondary | ICD-10-CM

## 2024-08-24 DIAGNOSIS — E7800 Pure hypercholesterolemia, unspecified: Secondary | ICD-10-CM

## 2024-08-24 DIAGNOSIS — R739 Hyperglycemia, unspecified: Secondary | ICD-10-CM | POA: Diagnosis not present

## 2024-08-24 DIAGNOSIS — I25119 Atherosclerotic heart disease of native coronary artery with unspecified angina pectoris: Secondary | ICD-10-CM

## 2024-08-24 DIAGNOSIS — Z0000 Encounter for general adult medical examination without abnormal findings: Secondary | ICD-10-CM | POA: Diagnosis not present

## 2024-08-24 DIAGNOSIS — I10 Essential (primary) hypertension: Secondary | ICD-10-CM | POA: Diagnosis not present

## 2024-08-24 DIAGNOSIS — R21 Rash and other nonspecific skin eruption: Secondary | ICD-10-CM

## 2024-08-24 DIAGNOSIS — G4730 Sleep apnea, unspecified: Secondary | ICD-10-CM

## 2024-08-24 MED ORDER — PANTOPRAZOLE SODIUM 40 MG PO TBEC: DELAYED_RELEASE_TABLET | ORAL | 3 refills | Status: AC

## 2024-08-24 MED ORDER — METHYLPREDNISOLONE 4 MG PO TBPK: ORAL_TABLET | ORAL | 0 refills | Status: DC
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
## 2024-08-24 NOTE — Assessment & Plan Note (Signed)
 Physical today 08/24/24.   Colonoscopy 12/2022 - one 3mm polyp transverse colon and internal hemorrhoids. PSA 11/2023 - 2.46

## 2024-08-26 ENCOUNTER — Encounter: Payer: Self-pay | Admitting: Internal Medicine

## 2024-08-26 NOTE — Assessment & Plan Note (Signed)
 F/u pulmonary 10/2023 - Obstructive sleep apnea: Loud snoring etc. in the past.  Sleep study 09/2023 reveals mild sleep apnea AHI 9.7.  Recommend CPAP.

## 2024-08-26 NOTE — Assessment & Plan Note (Signed)
 Appears to be c/w contact dermatitis. Was mowing. Concerned regarding poison ivy exposure. Treat with medrol  dose pak - 6 day taper. Has already used triamcinolone  cream. Follow.

## 2024-08-26 NOTE — Assessment & Plan Note (Signed)
 S/p colonoscopy 01/2023. Small TA. Recommended f/u in 5 years. Need copy of report.

## 2024-08-26 NOTE — Assessment & Plan Note (Signed)
 Continues on protonix. No upper symptoms reported.

## 2024-08-26 NOTE — Assessment & Plan Note (Signed)
 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 Continue risk factor modification. Continue plavix , metoprolol  and lipitor. Continue f/u with cardiology. Stable.

## 2024-08-26 NOTE — Assessment & Plan Note (Signed)
 On high dose lipitor. Did not tolerate repatha .  Continue lipitor. Follow lipid panel and liver function tests. Recently cardiology recommended adding nexletol . Low cholesterol diet and exercise.

## 2024-08-26 NOTE — Assessment & Plan Note (Signed)
 Low-carb diet and exercise.  Follow met b and A1c.

## 2024-08-26 NOTE — Assessment & Plan Note (Signed)
 Continue toprol  XL 100mg  q day. Follow pressures. No changes today.

## 2024-10-05 ENCOUNTER — Ambulatory Visit: Admitting: Nurse Practitioner

## 2024-10-05 ENCOUNTER — Encounter: Payer: Self-pay | Admitting: Nurse Practitioner

## 2024-10-05 VITALS — BP 118/78 | HR 62 | Temp 97.5°F | Ht 66.0 in | Wt 168.4 lb

## 2024-10-05 DIAGNOSIS — L2389 Allergic contact dermatitis due to other agents: Secondary | ICD-10-CM

## 2024-10-05 MED ORDER — TRIAMCINOLONE ACETONIDE 0.1 % EX CREA: 1.0000 | TOPICAL_CREAM | Freq: Two times a day (BID) | CUTANEOUS | 0 refills | Status: DC

## 2024-10-05 MED ORDER — METHYLPREDNISOLONE 4 MG PO TBPK: ORAL_TABLET | ORAL | 0 refills | Status: DC
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
## 2024-10-05 NOTE — Assessment & Plan Note (Signed)
 Allergic contact dermatitis with exposure to plant. Persistent pruritus and blistering. Previous topical treatment ineffective. Had similar episode in July and August. -Will treat with Medrol  dose pack and Triamolone cream for itching. -They will let us  know if symptoms not improving.

## 2024-10-23 ENCOUNTER — Ambulatory Visit: Payer: Self-pay

## 2024-10-23 ENCOUNTER — Other Ambulatory Visit: Payer: Self-pay | Admitting: Cardiology

## 2024-10-23 ENCOUNTER — Telehealth: Admitting: Physician Assistant

## 2024-10-23 DIAGNOSIS — I25119 Atherosclerotic heart disease of native coronary artery with unspecified angina pectoris: Secondary | ICD-10-CM

## 2024-10-23 DIAGNOSIS — K529 Noninfective gastroenteritis and colitis, unspecified: Secondary | ICD-10-CM

## 2024-10-23 DIAGNOSIS — E7800 Pure hypercholesterolemia, unspecified: Secondary | ICD-10-CM

## 2024-10-23 MED ORDER — FAMOTIDINE 20 MG PO TABS: 20.0000 mg | ORAL_TABLET | Freq: Two times a day (BID) | ORAL | 0 refills | Status: AC

## 2024-10-23 MED ORDER — ONDANSETRON 4 MG PO TBDP: 4.0000 mg | ORAL_TABLET | Freq: Three times a day (TID) | ORAL | 0 refills | Status: DC | PRN

## 2024-10-23 NOTE — Patient Instructions (Signed)
 Thomas Harper, thank you for joining [name], PA-C for today's virtual visit.  While this provider is not your primary care provider (PCP), if your PCP is located in our provider database this encounter information will be shared with them immediately following your visit.   A [facility] MyChart account gives you access to today's visit and all your visits, tests, and labs performed at [facility]  click here if you don't have a [facility] MyChart account or go to [email or web address]  Consent: (Patient) Thomas Harper provided verbal consent for this virtual visit at the beginning of the encounter.  Current Medications:  Current Outpatient Medications:    famotidine (PEPCID) 20 MG tablet, Take 1 tablet (20 mg total) by mouth 2 (two) times daily., Disp: 30 tablet, Rfl: 0   ondansetron  (ZOFRAN -ODT) 4 MG disintegrating tablet, Take 1 tablet (4 mg total) by mouth every 8 (eight) hours as needed for nausea or vomiting., Disp: 20 tablet, Rfl: 0   acetaminophen  (TYLENOL ) 500 MG tablet, Take 1,000 mg by mouth every 8 (eight) hours as needed for headache., Disp: , Rfl:    albuterol  (VENTOLIN  HFA) 108 (90 Base) MCG/ACT inhaler, Inhale 2 puffs into the lungs every 6 (six) hours as needed for wheezing or shortness of breath., Disp: 8 g, Rfl: 6   aspirin  EC 81 MG tablet, Take 1 tablet (81 mg total) by mouth daily. Swallow whole., Disp: 90 tablet, Rfl: 3   atorvastatin  (LIPITOR) 80 MG tablet, TAKE ONE TABLET (80 MG TOTAL) BY MOUTH DAILY., Disp: 90 tablet, Rfl: 2   Bempedoic Acid  (NEXLETOL ) 180 MG TABS, Take 1 tablet by mouth once daily, Disp: 90 tablet, Rfl: 1   clopidogrel  (PLAVIX ) 75 MG tablet, Take 1 tablet (75 mg total) by mouth daily., Disp: 90 tablet, Rfl: 3   metoprolol  succinate (TOPROL -XL) 100 MG 24 hr tablet, Take 1 tablet (100 mg total) by mouth daily. Take with or immediately following a meal., Disp: 90 tablet, Rfl: 3   Multiple Vitamin (MULTIVITAMIN) tablet,  Take 1 tablet by mouth daily., Disp: , Rfl:    pantoprazole  (PROTONIX ) 40 MG tablet, TAKE ONE TABLET (40 MG TOTAL) BY MOUTH DAILY. TAKE 30 MINUTES BEFORE YOUR EVENING MEAL, Disp: 90 tablet, Rfl: 3   Tiotropium Bromide-Olodaterol (STIOLTO RESPIMAT ) 2.5-2.5 MCG/ACT AERS, Inhale 2 puffs into the lungs daily., Disp: , Rfl:    triamcinolone  cream (KENALOG ) 0.1 %, Apply 1 Application topically 2 (two) times daily., Disp: 30 g, Rfl: 0   Medications ordered in this encounter:  Meds ordered this encounter  Medications   ondansetron  (ZOFRAN -ODT) 4 MG disintegrating tablet    Sig: Take 1 tablet (4 mg total) by mouth every 8 (eight) hours as needed for nausea or vomiting.    Dispense:  20 tablet    Refill:  0    Supervising Provider:   LAMPTEY, PHILIP O [[id number]]   famotidine (PEPCID) 20 MG tablet    Sig: Take 1 tablet (20 mg total) by mouth 2 (two) times daily.    Dispense:  30 tablet    Refill:  0    Supervising Provider:   LAMPTEY, PHILIP O [[id number]]     *If you need refills on other medications prior to your next appointment, please contact your pharmacy*  Follow-Up: Call back or seek an in-person evaluation if the symptoms worsen or if the condition fails to improve as anticipated.  [facility] [phone]  Other Instructions Please hydrate and rest. Sips of  fluids instead of large amounts at a time. Use the Zofran  as directed for nausea and vomiting. Keep a bland diet once you are keeping liquids down and want to try to eat -- see below. Continue your Protonix  daily. I have added on Famotidine to take over next couple of days to settle down your reflux more quickly.  If you note any non-resolving, new, or worsening symptoms despite treatment, please seek an in-person evaluation ASAP.   Food Choices to Help Relieve Diarrhea, Adult Diarrhea can make you feel weak and cause you to become dehydrated. Dehydration is a condition in which there is not enough water or other  fluids in the body. It is important to choose the right foods and drinks to: Relieve diarrhea. Replace lost fluids and nutrients. Prevent dehydration. What are tips for following this plan? Relieving diarrhea Avoid foods that make your diarrhea worse. These may include: Foods and drinks that are sweetened with high-fructose corn syrup, honey, or sweeteners such as xylitol, sorbitol, and mannitol . Check food labels for these ingredients. Fried, greasy, or spicy foods. Raw fruits and vegetables. Eat foods that are rich in probiotics. These include foods such as yogurt and fermented milk products. Probiotics can help increase healthy bacteria in your stomach and intestines (gastrointestinal or GI tract). This may help digestion and stop diarrhea. If you have lactose intolerance, avoid dairy products. These may make your diarrhea worse. Take medicine to help stop diarrhea only as told by your health care provider. Replacing nutrients  Eat bland, easy-to-digest foods in small amounts as you are able, until your diarrhea starts to get better. These foods include bananas, applesauce, rice, toast, and crackers. Over time, add nutrient-rich foods as your body tolerates them or as told by your health care provider. These include: Well-cooked protein foods, such as eggs, lean meats like fish or chicken without skin, and tofu. Peeled, seeded, and soft-cooked fruits and vegetables. Low-fat dairy products. Whole grains. Take vitamin and mineral supplements as told by your health care provider. Preventing dehydration  Start by sipping water or a solution to prevent dehydration (oral rehydration solution, or ORS). This is a drink that helps replace fluids and minerals your body has lost. You can buy an ORS at pharmacies and retail stores. Try to drink at least 8-10 cups (2,000-2,500 mL) of fluid each day to help replace lost fluids. If your urine is pale yellow, you are getting enough fluids. You may drink  other liquids in addition to water, such as fruit juice that you have added water to (diluted fruit juice) or low-calorie sports drinks, as tolerated or as told by your health care provider. Avoid drinks with caffeine, such as coffee, tea, or soft drinks. Avoid alcohol. This information is not intended to replace advice given to you by your health care provider. Make sure you discuss any questions you have with your health care provider. Document Revised: 06/01/2022 Document Reviewed: 06/01/2022 Elsevier Patient Education  2024 Elsevier Inc.   If you have been instructed to have an in-person evaluation today at a local Urgent Care facility, please use the link below. It will take you to a list of all of our available [facility], including address, phone number and hours of operation. Please do not delay care.  [facility]  If you or a family member do not have a primary care provider, use the link below to schedule a visit and establish care. When you choose a [facility] primary care  physician or advanced practice provider, you gain a long-term partner in health. Find a Primary Care Provider  Learn more about [facility]'s in-office and virtual care options: [facility] - Get Care Now

## 2024-10-23 NOTE — Telephone Encounter (Signed)
 FYI Only or Action Required?: FYI only for provider.  Patient was last seen in primary care on 10/05/2024 by Vincente Saber, NP.  Called Nurse Triage reporting Vomiting and Diarrhea.  Symptoms began several days ago.  Interventions attempted: Dietary changes. Ginger ale, electrolytes  Symptoms are: gradually worsening.  Triage Disposition: See HCP Within 4 Hours (Or PCP Triage)  Patient/caregiver understands and will follow disposition?: Yes  Reason for Disposition  [1] SEVERE diarrhea (e.g., 7 or more times / day more than normal) AND [2] age > 60 years  Answer Assessment - Initial Assessment Questions Spoke with pts wife (on [place]) and pt. No appt availability at PCP office today, scheduled for virtual UC today. Pt and wife confirm they have access to MyChart to be able to do virtual visit.  1. DIARRHEA SEVERITY: How bad is the diarrhea? How many more stools have you had in the past 24 hours than normal?      Every 1-2 hours  2. ONSET: When did the diarrhea begin?      Monday  3. STOOL DESCRIPTION:  How loose or watery is the diarrhea? What is the stool color? Is there any blood or mucous in the stool?     No blood in stool. Started black now yellow.  4. VOMITING: Are you also vomiting? If Yes, ask: How many times in the past 24 hours?      Every 1-2 hours. Emesis brown/yellow in color. No blood.  5. ABDOMEN PAIN: Are you having any abdomen pain? If Yes, ask: What does it feel like? (e.g., crampy, dull, intermittent, constant)      None  6. ABDOMEN PAIN SEVERITY: If present, ask: How bad is the pain?  (e.g., Scale 1-10; mild, moderate, or severe)     None  7. ORAL INTAKE: If vomiting, Have you been able to drink liquids? How much liquids have you had in the past 24 hours?     Not drinking a lot d/t N/V  8. HYDRATION: Any signs of dehydration? (e.g., dry mouth [not just dry lips], too weak to stand, dizziness, new weight loss) When did you  last urinate?     Denies all.   10. ANTIBIOTIC USE: Are you taking antibiotics now or have you taken antibiotics in the past 2 months?       No  11. OTHER SYMPTOMS: Do you have any other symptoms? (e.g., fever, blood in stool)       Feeling feverish, has not checked temperature.  Protocols used: [facility]

## 2024-10-23 NOTE — Telephone Encounter (Signed)
 Per note, being seen this pm.

## 2024-10-23 NOTE — Progress Notes (Signed)
 Virtual Visit Consent   Thomas Harper, you are scheduled for a virtual visit with a [facility] provider today. Just as with appointments in the office, your consent must be obtained to participate. Your consent will be active for this visit and any virtual visit you may have with one of our providers in the next 365 days. If you have a MyChart account, a copy of this consent can be sent to you electronically.  As this is a virtual visit, video technology does not allow for your provider to perform a traditional examination. This may limit your provider's ability to fully assess your condition. If your provider identifies any concerns that need to be evaluated in person or the need to arrange testing (such as labs, EKG, etc.), we will make arrangements to do so. Although advances in technology are sophisticated, we cannot ensure that it will always work on either your end or our end. If the connection with a video visit is poor, the visit may have to be switched to a telephone visit. With either a video or telephone visit, we are not always able to ensure that we have a secure connection.  By engaging in this virtual visit, you consent to the provision of healthcare and authorize for your insurance to be billed (if applicable) for the services provided during this visit. Depending on your insurance coverage, you may receive a charge related to this service.  I need to obtain your verbal consent now. Are you willing to proceed with your visit today? Thomas Harper has provided verbal consent on 10/23/2024 for a virtual visit (video or telephone). Thomas Harper, [place]  Date: 10/23/2024 2:26 PM   Virtual Visit via Video Note   I, Thomas Harper, connected with  Thomas Harper  ([id number], 10/10/1961) on 10/23/24 at  2:00 PM EDT by a video-enabled telemedicine application and verified that I am speaking with the correct person using two identifiers.  Location: Patient: Virtual Visit  Location Patient: Home Provider: Virtual Visit Location Provider: Home Office   I discussed the limitations of evaluation and management by telemedicine and the availability of in person appointments. The patient expressed understanding and agreed to proceed.    History of Present Illness: Thomas Harper is a 63 y.o. who identifies as a male who was assigned male at birth, and is being seen today for GI symptoms starting 2.5 days ago, initially with mild loose stool starting after a heavy meal so thought was just due to that. The next day with recurrence of frequent loose stool and development of heartburn despite his Pantoprazole , nausea and vomiting. Some chills without fever. Denies hematemesis, melena, hematochezia. Has been trying to hydrate but having a hard time keeping anything down. Is noting normal urine color and output. No known sick contacts.    HPI: HPI  Problems:  Patient Active Problem List   Diagnosis Date Noted   Allergic contact dermatitis due to other agents 08/01/2024   Sleep apnea 11/14/2023   Right shoulder pain 10/06/2023   Thrombocytosis 09/13/2023   Restless leg syndrome 09/13/2023   Anemia 07/29/2023   S/P CABG x 3 07/13/2023   CAD (coronary artery disease) 06/20/2023   Atrial myxoma 06/06/2023   Wart 03/19/2023   Snoring 11/13/2022   SOB (shortness of breath) 11/12/2022   Cutaneous abscess of back excluding buttocks 01/22/2022   Weight loss 10/19/2019   Chest pain 11/12/2018   Cervical spondylosis 09/08/2018   DDD (degenerative disc disease), cervical  12/02/2017   Chronic pain of right ankle 12/02/2017   Hand pain, left 09/10/2017   Right foot pain 09/09/2017   Dermatitis due to plants, including poison ivy, sumac, and oak 05/30/2017   Hypertension 10/24/2016   Left arm pain 09/12/2016   Hyperglycemia 05/07/2016   Headache 12/07/2015   Health care maintenance 07/07/2015   Rash 07/07/2015   Tobacco use disorder 03/25/2014   Family history of colon  cancer 03/25/2013   Kidney stones 03/25/2013   Vitamin D  deficiency 03/25/2013   Pure hypercholesterolemia 03/25/2013   Pain in soft tissues of limb 03/25/2013   Esophageal reflux 03/25/2013   Family history of malignant neoplasm of gastrointestinal tract 03/25/2013    Allergies:  Allergies  Allergen Reactions   Ezetimibe  Other (See Comments)   Repatha  [Evolocumab ] Other (See Comments)    Side effects   Fish Oil Rash   Lisinopril  Other (See Comments)    Fatigue   Simvastatin Rash   Medications:  Current Outpatient Medications:    famotidine (PEPCID) 20 MG tablet, Take 1 tablet (20 mg total) by mouth 2 (two) times daily., Disp: 30 tablet, Rfl: 0   ondansetron  (ZOFRAN -ODT) 4 MG disintegrating tablet, Take 1 tablet (4 mg total) by mouth every 8 (eight) hours as needed for nausea or vomiting., Disp: 20 tablet, Rfl: 0   acetaminophen  (TYLENOL ) 500 MG tablet, Take 1,000 mg by mouth every 8 (eight) hours as needed for headache., Disp: , Rfl:    albuterol  (VENTOLIN  HFA) 108 (90 Base) MCG/ACT inhaler, Inhale 2 puffs into the lungs every 6 (six) hours as needed for wheezing or shortness of breath., Disp: 8 g, Rfl: 6   aspirin  EC 81 MG tablet, Take 1 tablet (81 mg total) by mouth daily. Swallow whole., Disp: 90 tablet, Rfl: 3   atorvastatin  (LIPITOR) 80 MG tablet, TAKE ONE TABLET (80 MG TOTAL) BY MOUTH DAILY., Disp: 90 tablet, Rfl: 2   Bempedoic Acid  (NEXLETOL ) 180 MG TABS, Take 1 tablet by mouth once daily, Disp: 90 tablet, Rfl: 1   clopidogrel  (PLAVIX ) 75 MG tablet, Take 1 tablet (75 mg total) by mouth daily., Disp: 90 tablet, Rfl: 3   metoprolol  succinate (TOPROL -XL) 100 MG 24 hr tablet, Take 1 tablet (100 mg total) by mouth daily. Take with or immediately following a meal., Disp: 90 tablet, Rfl: 3   Multiple Vitamin (MULTIVITAMIN) tablet, Take 1 tablet by mouth daily., Disp: , Rfl:    pantoprazole  (PROTONIX ) 40 MG tablet, TAKE ONE TABLET (40 MG TOTAL) BY MOUTH DAILY. TAKE 30 MINUTES BEFORE  YOUR EVENING MEAL, Disp: 90 tablet, Rfl: 3   Tiotropium Bromide-Olodaterol (STIOLTO RESPIMAT ) 2.5-2.5 MCG/ACT AERS, Inhale 2 puffs into the lungs daily., Disp: , Rfl:    triamcinolone  cream (KENALOG ) 0.1 %, Apply 1 Application topically 2 (two) times daily., Disp: 30 g, Rfl: 0  Observations/Objective: Patient is well-developed, well-nourished in no acute distress.  Resting comfortably  at home.  Head is normocephalic, atraumatic.  No labored breathing.  Speech is clear and coherent with logical content.  Patient is alert and oriented at baseline.    Assessment and Plan: 1. Gastroenteritis (Primary) - ondansetron  (ZOFRAN -ODT) 4 MG disintegrating tablet; Take 1 tablet (4 mg total) by mouth every 8 (eight) hours as needed for nausea or vomiting.  Dispense: 20 tablet; Refill: 0 - famotidine (PEPCID) 20 MG tablet; Take 1 tablet (20 mg total) by mouth 2 (two) times daily.  Dispense: 30 tablet; Refill: 0  Suspected viral. Some gastritis as well -- a  lot of heavy foods lately so could be multifactorial. Supportive measures and OTC medications reviewed. Imodium OTC ok short-term. Zofran  and famotidine per orders. Strict ER precautions reviewed with patient and wife.  Follow Up Instructions: I discussed the assessment and treatment plan with the patient. The patient was provided an opportunity to ask questions and all were answered. The patient agreed with the plan and demonstrated an understanding of the instructions.  A copy of instructions were sent to the patient via MyChart unless otherwise noted below.    The patient was advised to call back or seek an in-person evaluation if the symptoms worsen or if the condition fails to improve as anticipated.    Thomas Velma Lunger, PA-C

## 2024-10-23 NOTE — Telephone Encounter (Signed)
    Message from Alfonso ORN sent at 10/23/2024 10:45 AM EDT  Reason for Triage: pt wife called on behalf of pt who is throwing up with diarrhea  open heart surgery jul 2024 with other medications so didn't know what medication he can take over the counter . Currently taking electrolytes but is also throwing up water. callback [phone]

## 2024-10-26 ENCOUNTER — Ambulatory Visit: Admitting: Internal Medicine

## 2024-10-26 ENCOUNTER — Encounter: Payer: Self-pay | Admitting: Internal Medicine

## 2024-10-26 VITALS — BP 120/84 | HR 84 | Temp 97.8°F | Ht 66.0 in | Wt 161.2 lb

## 2024-10-26 DIAGNOSIS — R197 Diarrhea, unspecified: Secondary | ICD-10-CM

## 2024-10-26 DIAGNOSIS — R739 Hyperglycemia, unspecified: Secondary | ICD-10-CM

## 2024-10-26 DIAGNOSIS — E7800 Pure hypercholesterolemia, unspecified: Secondary | ICD-10-CM

## 2024-10-26 DIAGNOSIS — Z23 Encounter for immunization: Secondary | ICD-10-CM

## 2024-10-26 DIAGNOSIS — I10 Essential (primary) hypertension: Secondary | ICD-10-CM | POA: Diagnosis not present

## 2024-10-26 DIAGNOSIS — I25119 Atherosclerotic heart disease of native coronary artery with unspecified angina pectoris: Secondary | ICD-10-CM

## 2024-10-26 LAB — HEPATIC FUNCTION PANEL
ALT: 110 U/L — ABNORMAL HIGH (ref 0–53)
AST: 79 U/L — ABNORMAL HIGH (ref 0–37)
Albumin: 4.5 g/dL (ref 3.5–5.2)
Alkaline Phosphatase: 58 U/L (ref 39–117)
Bilirubin, Direct: 0.1 mg/dL (ref 0.0–0.3)
Total Bilirubin: 0.5 mg/dL (ref 0.2–1.2)
Total Protein: 7 g/dL (ref 6.0–8.3)

## 2024-10-26 LAB — CBC WITH DIFFERENTIAL/PLATELET
Basophils Absolute: 0 K/uL (ref 0.0–0.1)
Basophils Relative: 0.6 % (ref 0.0–3.0)
Eosinophils Absolute: 0.1 K/uL (ref 0.0–0.7)
Eosinophils Relative: 2.7 % (ref 0.0–5.0)
HCT: 41.4 % (ref 39.0–52.0)
Hemoglobin: 14.2 g/dL (ref 13.0–17.0)
Lymphocytes Relative: 26.3 % (ref 12.0–46.0)
Lymphs Abs: 1.2 K/uL (ref 0.7–4.0)
MCHC: 34.3 g/dL (ref 30.0–36.0)
MCV: 88.5 fl (ref 78.0–100.0)
Monocytes Absolute: 0.6 K/uL (ref 0.1–1.0)
Monocytes Relative: 12.6 % — ABNORMAL HIGH (ref 3.0–12.0)
Neutro Abs: 2.6 K/uL (ref 1.4–7.7)
Neutrophils Relative %: 57.8 % (ref 43.0–77.0)
Platelets: 281 K/uL (ref 150.0–400.0)
RBC: 4.67 Mil/uL (ref 4.22–5.81)
RDW: 13.6 % (ref 11.5–15.5)
WBC: 4.5 K/uL (ref 4.0–10.5)

## 2024-10-26 LAB — LIPID PANEL
Cholesterol: 142 mg/dL (ref 0–200)
HDL: 44.2 mg/dL (ref >39.00)
LDL Cholesterol: 79 mg/dL (ref 0–99)
NonHDL: 98.15
Total CHOL/HDL Ratio: 3
Triglycerides: 96 mg/dL (ref 0.0–149.0)
VLDL: 19.2 mg/dL (ref 0.0–40.0)

## 2024-10-26 LAB — BASIC METABOLIC PANEL WITH GFR
BUN: 23 mg/dL (ref 6–23)
CO2: 30 meq/L (ref 19–32)
Calcium: 9.4 mg/dL (ref 8.4–10.5)
Chloride: 100 meq/L (ref 96–112)
Creatinine, Ser: 1.05 mg/dL (ref 0.40–1.50)
GFR: 75.58 mL/min (ref >60.00)
Glucose, Bld: 85 mg/dL (ref 70–99)
Potassium: 4.8 meq/L (ref 3.5–5.1)
Sodium: 139 meq/L (ref 135–145)

## 2024-10-26 LAB — HEMOGLOBIN A1C: Hgb A1c MFr Bld: 6.4 % (ref 4.6–6.5)
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
## 2024-10-27 ENCOUNTER — Ambulatory Visit: Payer: Self-pay | Admitting: Internal Medicine

## 2024-10-27 ENCOUNTER — Encounter: Payer: Self-pay | Admitting: Internal Medicine

## 2024-10-27 DIAGNOSIS — R197 Diarrhea, unspecified: Secondary | ICD-10-CM | POA: Insufficient documentation

## 2024-10-27 DIAGNOSIS — R7989 Other specified abnormal findings of blood chemistry: Secondary | ICD-10-CM

## 2024-10-27 DIAGNOSIS — E7800 Pure hypercholesterolemia, unspecified: Secondary | ICD-10-CM

## 2024-10-27 DIAGNOSIS — K529 Noninfective gastroenteritis and colitis, unspecified: Secondary | ICD-10-CM

## 2024-10-27 NOTE — Assessment & Plan Note (Signed)
 Low-carb diet and exercise.  Follow met b and A1c.

## 2024-10-27 NOTE — Assessment & Plan Note (Signed)
 Recent diarrhea, vomiting and abdominal discomfort. Recent evaluated - diagnosed with gastroenteritis. Treated with zofran . Symptoms have improved. No abdominal pain. No vomiting or diarrhea now. Has started eating. Bland foods - advance diet slowly. Stay hydrated. Check labs, including cbc, metabolic panel and liver function tests. Follow.

## 2024-10-27 NOTE — Assessment & Plan Note (Signed)
 Continue toprol . Follow pressures. Follow metabolic panel.

## 2024-10-27 NOTE — Assessment & Plan Note (Signed)
 On high dose lipitor. Did not tolerate repatha .  Continue lipitor. Follow lipid panel and liver function tests. Cardiology recommended adding nexletol . Continue low cholesterol diet and exercise.

## 2024-10-27 NOTE — Assessment & Plan Note (Signed)
 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 Continue risk factor modification. Continue plavix , metoprolol  and lipitor. Continue f/u with cardiology. Had the chest discomfort with current GI flare as outlined. Discussed. No further episodes. No pain with increased activity or exertion. Discussed EKG today. Wants to hold now pain now. Follow.

## 2024-10-31 NOTE — Telephone Encounter (Signed)
 Order signed for abdominal ultrasound and f/u liver function tests.

## 2024-11-07 NOTE — Addendum Note (Signed)
 Addended by: MARYLEN PRO A on: 11/07/2024 11:12 AM   Modules accepted: Orders

## 2024-11-09 ENCOUNTER — Other Ambulatory Visit

## 2024-11-09 DIAGNOSIS — E7800 Pure hypercholesterolemia, unspecified: Secondary | ICD-10-CM

## 2024-11-09 DIAGNOSIS — R7989 Other specified abnormal findings of blood chemistry: Secondary | ICD-10-CM
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
## 2024-11-10 ENCOUNTER — Ambulatory Visit: Payer: Self-pay | Admitting: Internal Medicine

## 2024-11-10 LAB — HEPATIC FUNCTION PANEL
AG Ratio: 2 (calc) (ref 1.0–2.5)
ALT: 43 U/L (ref 9–46)
AST: 36 U/L — ABNORMAL HIGH (ref 10–35)
Albumin: 4.5 g/dL (ref 3.6–5.1)
Alkaline phosphatase (APISO): 46 U/L (ref 35–144)
Bilirubin, Direct: 0.2 mg/dL (ref 0.0–0.2)
Globulin: 2.3 g/dL (ref 1.9–3.7)
Indirect Bilirubin: 0.4 mg/dL (ref 0.2–1.2)
Total Bilirubin: 0.6 mg/dL (ref 0.2–1.2)
Total Protein: 6.8 g/dL (ref 6.1–8.1)

## 2024-11-12 ENCOUNTER — Other Ambulatory Visit: Payer: Self-pay | Admitting: Cardiology

## 2024-11-16 ENCOUNTER — Ambulatory Visit
Admission: RE | Admit: 2024-11-16 | Discharge: 2024-11-16 | Disposition: A | Discharge status: Home / Self Care | Source: Ambulatory Visit | Attending: Internal Medicine | Admitting: Internal Medicine

## 2024-11-16 DIAGNOSIS — R7989 Other specified abnormal findings of blood chemistry: Secondary | ICD-10-CM | POA: Diagnosis present

## 2024-12-07 ENCOUNTER — Ambulatory Visit: Admitting: Internal Medicine

## 2024-12-07 VITALS — BP 132/84 | HR 97 | Temp 97.7°F | Ht 66.0 in | Wt 167.2 lb

## 2024-12-07 DIAGNOSIS — I10 Essential (primary) hypertension: Secondary | ICD-10-CM

## 2024-12-07 DIAGNOSIS — E7800 Pure hypercholesterolemia, unspecified: Secondary | ICD-10-CM

## 2024-12-07 DIAGNOSIS — R739 Hyperglycemia, unspecified: Secondary | ICD-10-CM

## 2024-12-07 DIAGNOSIS — R7989 Other specified abnormal findings of blood chemistry: Secondary | ICD-10-CM | POA: Insufficient documentation

## 2024-12-07 DIAGNOSIS — Z125 Encounter for screening for malignant neoplasm of prostate: Secondary | ICD-10-CM

## 2024-12-07 DIAGNOSIS — K219 Gastro-esophageal reflux disease without esophagitis: Secondary | ICD-10-CM

## 2024-12-07 DIAGNOSIS — D649 Anemia, unspecified: Secondary | ICD-10-CM

## 2024-12-07 DIAGNOSIS — I25119 Atherosclerotic heart disease of native coronary artery with unspecified angina pectoris: Secondary | ICD-10-CM

## 2024-12-07 DIAGNOSIS — G4730 Sleep apnea, unspecified: Secondary | ICD-10-CM

## 2024-12-07 DIAGNOSIS — B079 Viral wart, unspecified: Secondary | ICD-10-CM

## 2024-12-07 DIAGNOSIS — E559 Vitamin D deficiency, unspecified: Secondary | ICD-10-CM

## 2024-12-07 DIAGNOSIS — Z800 Family history of malignant neoplasm of digestive organs: Secondary | ICD-10-CM

## 2024-12-07 MED ORDER — MUPIROCIN 2 % EX OINT: 1.0000 | TOPICAL_OINTMENT | Freq: Two times a day (BID) | CUTANEOUS | 0 refills | Status: AC

## 2024-12-07 NOTE — Addendum Note (Signed)
 Addended by: Meaghann Choo C on: 12/07/2024 03:46 PM   Modules accepted: Orders
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
## 2024-12-08 ENCOUNTER — Encounter: Payer: Self-pay | Admitting: Internal Medicine

## 2024-12-08 ENCOUNTER — Ambulatory Visit: Payer: Self-pay | Admitting: Internal Medicine

## 2024-12-08 LAB — HEPATIC FUNCTION PANEL
ALT: 39 IU/L (ref 0–44)
AST: 41 IU/L — ABNORMAL HIGH (ref 0–40)
Albumin: 4.5 g/dL (ref 3.9–4.9)
Alkaline Phosphatase: 59 IU/L (ref 47–123)
Bilirubin Total: 0.3 mg/dL (ref 0.0–1.2)
Bilirubin, Direct: 0.09 mg/dL (ref 0.00–0.40)
Total Protein: 6.9 g/dL (ref 6.0–8.5)

## 2024-12-08 LAB — HEMOGLOBIN A1C
Est. average glucose Bld gHb Est-mCnc: 126 mg/dL
Hgb A1c MFr Bld: 6 % — ABNORMAL HIGH (ref 4.8–5.6)

## 2024-12-08 LAB — LIPID PANEL
Chol/HDL Ratio: 3.9 ratio (ref 0.0–5.0)
Cholesterol, Total: 150 mg/dL (ref 100–199)
HDL: 38 mg/dL — ABNORMAL LOW (ref >39)
LDL Chol Calc (NIH): 87 mg/dL (ref 0–99)
Triglycerides: 138 mg/dL (ref 0–149)
VLDL Cholesterol Cal: 25 mg/dL (ref 5–40)

## 2024-12-08 LAB — BASIC METABOLIC PANEL WITH GFR
BUN/Creatinine Ratio: 22 (ref 10–24)
BUN: 20 mg/dL (ref 8–27)
CO2: 23 mmol/L (ref 20–29)
Calcium: 9.5 mg/dL (ref 8.6–10.2)
Chloride: 101 mmol/L (ref 96–106)
Creatinine, Ser: 0.89 mg/dL (ref 0.76–1.27)
Glucose: 85 mg/dL (ref 70–99)
Potassium: 4.5 mmol/L (ref 3.5–5.2)
Sodium: 139 mmol/L (ref 134–144)
eGFR: 96 mL/min/1.73 (ref >59)

## 2024-12-08 NOTE — Assessment & Plan Note (Signed)
 Recent abdominal ultrasound - ok. Recheck liver panel. No acute GI issues. Treat acid reflux.

## 2024-12-08 NOTE — Assessment & Plan Note (Signed)
 Hgb recent check wnl.

## 2024-12-08 NOTE — Assessment & Plan Note (Signed)
 Low cholesterol diet and exercise. Continue lipitor. Cardiology recommended nexletol . Follow lipid panel.

## 2024-12-08 NOTE — Assessment & Plan Note (Signed)
Check vitamin d level with next labs.  

## 2024-12-08 NOTE — Assessment & Plan Note (Signed)
 Discussed current symptoms. Discussed avoiding eating late and avoiding eating and immediately lying down. Continue protonix . Add pepcid  in the pm. Follow symptoms. Call with update.

## 2024-12-08 NOTE — Assessment & Plan Note (Signed)
 S/p colonoscopy 01/2023. Small TA. Recommended f/u in 5 years.

## 2024-12-08 NOTE — Assessment & Plan Note (Signed)
 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 Continue risk factor modification. Continue plavix , metoprolol  and lipitor. Continue f/u with cardiology.  Currently feels from a cardiac standpoint - stable.

## 2024-12-08 NOTE — Assessment & Plan Note (Signed)
 S/p removal wart left index finger. Followed by dermatology.

## 2024-12-08 NOTE — Assessment & Plan Note (Signed)
 Saw pulmonary 01/2024 - CPAP. Need to confirm f/u scheduled.

## 2024-12-08 NOTE — Assessment & Plan Note (Signed)
 Continue toprol . Blood pressure as outlined. Follow pressures. Follow metabolic panel.

## 2024-12-08 NOTE — Assessment & Plan Note (Signed)
 Low-carb diet and exercise.  Follow met b and A1c.
